# Patient Record
Sex: Female | Born: 1935
Health system: Southern US, Community
[De-identification: ages and names within clinical notes are randomized; demographics above are authoritative.]

## PROBLEM LIST (undated history)

## (undated) DIAGNOSIS — M1990 Unspecified osteoarthritis, unspecified site: Secondary | ICD-10-CM

## (undated) DIAGNOSIS — E785 Hyperlipidemia, unspecified: Secondary | ICD-10-CM

## (undated) DIAGNOSIS — J189 Pneumonia, unspecified organism: Secondary | ICD-10-CM

## (undated) DIAGNOSIS — I10 Essential (primary) hypertension: Secondary | ICD-10-CM

## (undated) DIAGNOSIS — I4891 Unspecified atrial fibrillation: Secondary | ICD-10-CM

## (undated) HISTORY — DX: Hyperlipidemia, unspecified: E78.5

## (undated) HISTORY — PX: CATARACT EXTRACTION: SUR2

## (undated) HISTORY — DX: Unspecified atrial fibrillation: I48.91

## (undated) HISTORY — DX: Unspecified osteoarthritis, unspecified site: M19.90

## (undated) HISTORY — PX: BACK SURGERY: SHX140

## (undated) HISTORY — PX: LAMINECTOMY: SHX219

## (undated) HISTORY — DX: Pneumonia, unspecified organism: J18.9

## (undated) HISTORY — DX: Essential (primary) hypertension: I10

---
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
## 1974-06-29 HISTORY — PX: ABDOMINAL HYSTERECTOMY: SHX81

## 1997-06-29 HISTORY — PX: HAND TENDON SURGERY: SHX663

## 1997-06-29 HISTORY — PX: LAMINECTOMY: SHX219

## 2001-06-29 HISTORY — PX: CHOLECYSTECTOMY: SHX55

## 2010-09-22 ENCOUNTER — Other Ambulatory Visit: Payer: Self-pay | Admitting: Internal Medicine

## 2010-09-22 ENCOUNTER — Encounter: Payer: Self-pay | Admitting: Internal Medicine

## 2010-09-22 DIAGNOSIS — Z1231 Encounter for screening mammogram for malignant neoplasm of breast: Secondary | ICD-10-CM

## 2010-10-21 ENCOUNTER — Encounter: Payer: Self-pay | Admitting: Cardiology

## 2010-10-21 DIAGNOSIS — M1990 Unspecified osteoarthritis, unspecified site: Secondary | ICD-10-CM | POA: Insufficient documentation

## 2010-10-21 DIAGNOSIS — I10 Essential (primary) hypertension: Secondary | ICD-10-CM | POA: Insufficient documentation

## 2010-10-21 DIAGNOSIS — J45909 Unspecified asthma, uncomplicated: Secondary | ICD-10-CM | POA: Insufficient documentation

## 2010-10-21 DIAGNOSIS — E119 Type 2 diabetes mellitus without complications: Secondary | ICD-10-CM | POA: Insufficient documentation

## 2010-10-21 DIAGNOSIS — E785 Hyperlipidemia, unspecified: Secondary | ICD-10-CM | POA: Insufficient documentation

## 2010-10-28 ENCOUNTER — Ambulatory Visit (INDEPENDENT_AMBULATORY_CARE_PROVIDER_SITE_OTHER): Payer: Medicare Other | Admitting: Cardiology

## 2010-10-28 ENCOUNTER — Encounter: Payer: Self-pay | Admitting: Cardiology

## 2010-10-28 VITALS — BP 140/70 | HR 68 | Ht 66.0 in | Wt 233.2 lb

## 2010-10-28 DIAGNOSIS — I4891 Unspecified atrial fibrillation: Secondary | ICD-10-CM

## 2010-10-28 DIAGNOSIS — E785 Hyperlipidemia, unspecified: Secondary | ICD-10-CM

## 2010-10-28 DIAGNOSIS — I10 Essential (primary) hypertension: Secondary | ICD-10-CM

## 2010-10-28 NOTE — Patient Instructions (Signed)
Continue your current medications.  We will request your prior records from [facility].  We will plan on seeing you back in 1 year unless you have new complaints.

## 2010-10-28 NOTE — Assessment & Plan Note (Signed)
Continue therapy with Crestor.

## 2010-10-28 NOTE — Assessment & Plan Note (Signed)
Blood pressure is well controlled and she is on appropriate medical therapy.

## 2010-10-28 NOTE — Assessment & Plan Note (Addendum)
Status post successful DC cardioversion in July of 2011. The difficulty is that she was completely asymptomatic at that time and so it is hard to tell if she has had any episodes of atrial fibrillation since that time. She does have a [place] score of at least 3. This would argue for long-term anticoagulation. I also agree with her current management with beta blocker and ACE inhibitor therapy. I would avoid caffeine. We will request a copy of her cardiology evaluation from [facility].

## 2010-10-28 NOTE — Progress Notes (Signed)
Casey Patel Date of Birth: 1930-04-15   History of Present Illness: Casey Patel is a very pleasant 77 year old white female who is seen at the request of Dr. Wylene Simmer to establish cardiac care. She recently moved from [place] to be close to her sister here. She has a history of atrial fibrillation and underwent DC cardioversion in July of 2011. At that time atrial fibrillation was noted on a routine physical. She reports that she was completely asymptomatic. She had no chest pain, shortness of breath, dizziness, palpitations, or diaphoresis. As best she can tell she has not had any recurrence. She has been on chronic Coumadin therapy. She has no prior history of stroke. She denies any other cardiac history. In general she feels well but is limited by arthritis.  Current Outpatient Prescriptions on File Prior to Visit  Medication Sig Dispense Refill  . albuterol (PROAIR HFA) 108 (90 BASE) MCG/ACT inhaler Inhale 2 puffs into the lungs every 4 (four) hours as needed.        . cyclobenzaprine (FLEXERIL) 10 MG tablet Take 10 mg by mouth 3 (three) times daily as needed.        Tery Sanfilippo Calcium (STOOL SOFTENER PO) Take 1 tablet by mouth as needed.        [name] FERROUS SULFATE PO Take 40 mg by mouth daily.        [name] glipiZIDE-metformin (METAGLIP) 2.5-500 MG per tablet Take 1 tablet by mouth daily.        . hydrochlorothiazide 25 MG tablet Take 25 mg by mouth daily.        . metoprolol (TOPROL-XL) 100 MG 24 hr tablet Take 100 mg by mouth daily.        . ramipril (ALTACE) 10 MG tablet Take 10 mg by mouth daily.        . rosuvastatin (CRESTOR) 5 MG tablet Take 5 mg by mouth daily.        . traMADol (ULTRAM) 50 MG tablet Take 50 mg by mouth every 6 (six) hours as needed.        . warfarin (COUMADIN) 5 MG tablet Take 5 mg by mouth daily. AS DIRECTED         Allergies  Allergen Reactions  . Other     Adhesive tap  . Phenergan     Disorientation   . Tylenol (Acetaminophen)     Causes fast  heart rate  . Codeine Itching and Rash    Past Medical History  Diagnosis Date  . Arthritis   . Hypertension   . Hyperlipidemia   . AF (atrial fibrillation)   . Asthma   . Diabetes mellitus     TYPE 2    Past Surgical History  Procedure Date  . Tonsillectomy 1952  . Abdominal hysterectomy 1976  . Cholecystectomy 2003  . Laminectomy 1999    with fusion (L1-5)  . Hand tendon surgery 1999    left wrist - thumb  . Laminectomy [id number]    with fusion (L3-4)    History  Smoking status  . Former Smoker -- 0.8 packs/day for 20 years  . Types: Cigarettes  . Quit date: 06/29/1978  Smokeless tobacco  . Never Used    History  Alcohol Use No    Family History  Problem Relation Age of Onset  . Hypertension Mother 72  . Stroke Mother 40  . Arthritis Mother 40  . Heart attack Father 70  . Heart disease Father 34  . Hypertension  Father 2  . Ulcers Father 74  . Heart disease Brother     CONGENTIAL HEART DISEASE  . Heart disease Sister   . Diabetes Sister     Review of Systems: The review of systems is positive for significant arthritis in her hands, back, and knees. She previously took Naprosyn for this but said she has been on Coumadin she no longer takes NSAIDs. She reports her Coumadin checks have all been therapeutic.  All other systems were reviewed and are negative.  Physical Exam: BP 140/70  Pulse 68  Ht 5\' 6"  (1.676 m)  Wt 233 lb 4 oz (105.802 kg)  BMI 37.65 kg/m2 She is a pleasant elderly white female in no acute distress. She is normocephalic, atraumatic. Pupils are equal round and reactive to light and accommodation. Extraocular movements are full. Oropharynx is clear. Neck is supple without JVD, adenopathy, thyromegaly, or bruits. Lungs are clear. Cardiac exam reveals a regular rate and rhythm without gallop, murmur, or click. Abdomen is obese, soft, nontender. She has no masses are passed the megaly. She has no edema. Pulses are good. Skin is warm and dry.  She is kyphotic. And walks with a walker. Cranial nerves II through XII are intact. LABORATORY DATA: ECG demonstrates normal sinus rhythm with an incomplete right bundle branch block.  Assessment / Plan:

## 2010-10-29 ENCOUNTER — Encounter: Payer: Self-pay | Admitting: Cardiology

## 2011-02-11 ENCOUNTER — Ambulatory Visit
Admission: RE | Admit: 2011-02-11 | Discharge: 2011-02-11 | Disposition: A | Discharge status: Home / Self Care | Payer: Medicare Other | Source: Ambulatory Visit | Attending: Internal Medicine | Admitting: Internal Medicine

## 2011-02-11 DIAGNOSIS — Z1231 Encounter for screening mammogram for malignant neoplasm of breast: Secondary | ICD-10-CM

## 2011-07-15 DIAGNOSIS — Z7901 Long term (current) use of anticoagulants: Secondary | ICD-10-CM | POA: Diagnosis not present

## 2011-07-15 DIAGNOSIS — I4891 Unspecified atrial fibrillation: Secondary | ICD-10-CM | POA: Diagnosis not present

## 2011-07-16 ENCOUNTER — Emergency Department (HOSPITAL_COMMUNITY): Payer: Medicare Other

## 2011-07-16 ENCOUNTER — Emergency Department (HOSPITAL_COMMUNITY)
Admission: EM | Admit: 2011-07-16 | Discharge: 2011-07-16 | Disposition: A | Discharge status: Home / Self Care | Payer: Medicare Other | Attending: Emergency Medicine | Admitting: Emergency Medicine

## 2011-07-16 DIAGNOSIS — S0191XA Laceration without foreign body of unspecified part of head, initial encounter: Secondary | ICD-10-CM

## 2011-07-16 DIAGNOSIS — S0100XA Unspecified open wound of scalp, initial encounter: Secondary | ICD-10-CM | POA: Diagnosis not present

## 2011-07-16 DIAGNOSIS — I10 Essential (primary) hypertension: Secondary | ICD-10-CM | POA: Diagnosis not present

## 2011-07-16 DIAGNOSIS — Z7901 Long term (current) use of anticoagulants: Secondary | ICD-10-CM | POA: Insufficient documentation

## 2011-07-16 DIAGNOSIS — W19XXXA Unspecified fall, initial encounter: Secondary | ICD-10-CM | POA: Insufficient documentation

## 2011-07-16 DIAGNOSIS — E119 Type 2 diabetes mellitus without complications: Secondary | ICD-10-CM | POA: Insufficient documentation

## 2011-07-16 DIAGNOSIS — J45909 Unspecified asthma, uncomplicated: Secondary | ICD-10-CM | POA: Insufficient documentation

## 2011-07-16 DIAGNOSIS — S0990XA Unspecified injury of head, initial encounter: Secondary | ICD-10-CM | POA: Insufficient documentation

## 2011-07-16 DIAGNOSIS — Z79899 Other long term (current) drug therapy: Secondary | ICD-10-CM | POA: Diagnosis not present

## 2011-07-16 DIAGNOSIS — S0190XA Unspecified open wound of unspecified part of head, initial encounter: Secondary | ICD-10-CM | POA: Diagnosis not present

## 2011-07-16 DIAGNOSIS — E785 Hyperlipidemia, unspecified: Secondary | ICD-10-CM | POA: Diagnosis not present

## 2011-07-16 DIAGNOSIS — M129 Arthropathy, unspecified: Secondary | ICD-10-CM | POA: Insufficient documentation

## 2011-07-16 DIAGNOSIS — Y92009 Unspecified place in unspecified non-institutional (private) residence as the place of occurrence of the external cause: Secondary | ICD-10-CM | POA: Insufficient documentation

## 2011-07-16 DIAGNOSIS — T1490XA Injury, unspecified, initial encounter: Secondary | ICD-10-CM | POA: Diagnosis not present

## 2011-07-16 DIAGNOSIS — I4891 Unspecified atrial fibrillation: Secondary | ICD-10-CM | POA: Diagnosis not present

## 2011-07-16 LAB — BASIC METABOLIC PANEL
BUN: 17 mg/dL (ref 6–23)
Creatinine, Ser: 0.74 mg/dL (ref 0.50–1.10)
GFR calc Af Amer: >90 mL/min (ref >90)
GFR calc non Af Amer: 79 mL/min — ABNORMAL LOW (ref >90)
Glucose, Bld: 101 mg/dL — ABNORMAL HIGH (ref 70–99)

## 2011-07-16 LAB — CBC
HCT: 37.7 % (ref 36.0–46.0)
Hemoglobin: 12.2 g/dL (ref 12.0–15.0)
MCH: 29.3 pg (ref 26.0–34.0)
MCHC: 32.4 g/dL (ref 30.0–36.0)
MCV: 90.4 fL (ref 78.0–100.0)
RDW: 14.3 % (ref 11.5–15.5)

## 2011-07-16 MED ORDER — TRAMADOL HCL 50 MG PO TABS: 50.0000 mg | ORAL_TABLET | Freq: Four times a day (QID) | ORAL | Status: AC | PRN

## 2011-07-16 MED ORDER — LIDOCAINE-EPINEPHRINE (PF) 1 %-1:200000 IJ SOLN
INTRAMUSCULAR | Status: AC
  Filled 2011-07-16: qty 10

## 2011-07-16 MED ORDER — BACITRACIN ZINC 500 UNIT/GM EX OINT
TOPICAL_OINTMENT | CUTANEOUS | Status: AC
  Filled 2011-07-16: qty 2.7

## 2011-07-16 MED ORDER — CEPHALEXIN 500 MG PO CAPS: 500.0000 mg | ORAL_CAPSULE | Freq: Four times a day (QID) | ORAL | Status: AC

## 2011-07-16 MED ORDER — TETANUS-DIPHTH-ACELL PERTUSSIS 5-2.5-18.5 LF-MCG/0.5 IM SUSP
0.5000 mL | Freq: Once | INTRAMUSCULAR | Status: AC
  Administered 2011-07-16: 0.5 mL via INTRAMUSCULAR
  Filled 2011-07-16: qty 0.5

## 2011-07-16 NOTE — ED Provider Notes (Signed)
History     CSN: [id number]  Arrival date & time 07/16/11  0145   First MD Initiated Contact with Patient 07/16/11 0226      Chief Complaint  Patient presents with  . Head Laceration    (Consider location/radiation/quality/duration/timing/severity/associated sxs/prior treatment) Patient is a 78 y.o. female presenting with scalp laceration. The history is provided by the patient.  Head Laceration This is a new problem. The current episode started 1 to 2 hours ago. The problem occurs constantly. The problem has not changed since onset.Pertinent negatives include no chest pain, no abdominal pain and no shortness of breath. The symptoms are aggravated by nothing. The symptoms are relieved by nothing. She has tried nothing for the symptoms.   patient takes Coumadin for atrial fibrillation. She was at home tonight she was with her sister and after using the bathroom stood up and lost her balance and struck her head against the wall. She sustained a laceration to her scalp. She denies any LOC. She denies any neck pain. She denies any weakness or numbness. She denies any preceding symptoms of dizziness, chest pain, shortness of breath or abdominal pain. Bleeding controlled prior to arrival. Patient denies any significant pain or complaints otherwise. She did not injure her upper extremities or lower extremities. No difficulty walking.  Past Medical History  Diagnosis Date  . Arthritis   . Hypertension   . Hyperlipidemia   . AF (atrial fibrillation)   . Asthma   . Diabetes mellitus     TYPE 2    Past Surgical History  Procedure Date  . Tonsillectomy 1952  . Abdominal hysterectomy 1976  . Cholecystectomy 2003  . Laminectomy 1999    with fusion (L1-5)  . Hand tendon surgery 1999    left wrist - thumb  . Laminectomy [id number]    with fusion (L3-4)    Family History  Problem Relation Age of Onset  . Hypertension Mother 9  . Stroke Mother 68  . Arthritis Mother 39  . Heart attack  Father 83  . Heart disease Father 37  . Hypertension Father 24  . Ulcers Father 58  . Heart disease Brother     CONGENTIAL HEART DISEASE  . Heart disease Sister   . Diabetes Sister     History  Substance Use Topics  . Smoking status: Former Smoker -- 0.8 packs/day for 20 years    Types: Cigarettes    Quit date: 06/29/1978  . Smokeless tobacco: Never Used  . Alcohol Use: No    OB History    Grav Para Term Preterm Abortions TAB SAB Ect Mult Living                  Review of Systems  Constitutional: Negative for fever and chills.  HENT: Negative for neck pain and neck stiffness.   Eyes: Negative for pain.  Respiratory: Negative for shortness of breath.   Cardiovascular: Negative for chest pain.  Gastrointestinal: Negative for abdominal pain.  Genitourinary: Negative for dysuria.  Musculoskeletal: Negative for back pain.  Skin: Positive for wound. Negative for rash.  Neurological: Negative for dizziness and light-headedness.  All other systems reviewed and are negative.    Allergies  Other; Phenergan; Tylenol; and Codeine  Home Medications   Current Outpatient Rx  Name Route Sig Dispense Refill  . ALBUTEROL SULFATE HFA 108 (90 BASE) MCG/ACT IN AERS Inhalation Inhale 2 puffs into the lungs every 4 (four) hours as needed.      [name] GLIPIZIDE-METFORMIN  HCL 2.5-500 MG PO TABS Oral Take 1 tablet by mouth daily.      [name] HYDROCHLOROTHIAZIDE 25 MG PO TABS Oral Take 25 mg by mouth daily.      [name] METHOCARBAMOL 750 MG PO TABS Oral Take 750 mg by mouth 4 (four) times daily.    [name] METOPROLOL SUCCINATE ER 100 MG PO TB24 Oral Take 100 mg by mouth daily.      [name] OMEPRAZOLE 20 MG PO CPDR Oral Take 20 mg by mouth daily.    [name] RAMIPRIL 10 MG PO TABS Oral Take 10 mg by mouth daily.      [name] ROSUVASTATIN CALCIUM 5 MG PO TABS Oral Take 5 mg by mouth daily.      . WARFARIN SODIUM 5 MG PO TABS Oral Take 5 mg by mouth daily. Patient takes 2.5mg  on Tuesday and 5mg  all other days      BP 123/52   Pulse 98  Temp(Src) 98.7 F (37.1 C) (Oral)  Resp 18  SpO2 93%  Physical Exam  Constitutional: She is oriented to person, place, and time. She appears well-developed and well-nourished.  HENT:  Head: Normocephalic.       Approximately 15 cm frontal scalp laceration curvilinear and full thickness. No active bleeding. Laceration is completely within the hairline.  Eyes: Conjunctivae and EOM are normal. Pupils are equal, round, and reactive to light.  Neck: Full passive range of motion without pain. Neck supple. No thyromegaly present.       No midline tenderness or deformity  Cardiovascular: Normal rate, regular rhythm, S1 normal, S2 normal and intact distal pulses.   Pulmonary/Chest: Effort normal and breath sounds normal.  Abdominal: Soft. Bowel sounds are normal. There is no tenderness. There is no CVA tenderness.  Musculoskeletal: Normal range of motion.  Neurological: She is alert and oriented to person, place, and time. She has normal strength and normal reflexes. No cranial nerve deficit or sensory deficit. She displays a negative Romberg sign. GCS eye subscore is 4. GCS verbal subscore is 5. GCS motor subscore is 6.       Normal Gait  Skin: Skin is warm and dry. No rash noted. No cyanosis. Nails show no clubbing.  Psychiatric: She has a normal mood and affect. Her speech is normal and behavior is normal.    ED Course  LACERATION REPAIR Date/Time: 07/16/2011 5:00 AM Performed by: Sunnie Nielsen Authorized by: Sunnie Nielsen Consent: Verbal consent obtained. Risks and benefits: risks, benefits and alternatives were discussed Consent given by: patient Patient understanding: patient states understanding of the procedure being performed Patient consent: the patient's understanding of the procedure matches consent given Procedure consent: procedure consent matches procedure scheduled Required items: required blood products, implants, devices, and special equipment available Patient  identity confirmed: verbally with patient Time out: Immediately prior to procedure a "time out" was called to verify the correct patient, procedure, equipment, support staff and site/side marked as required. Body area: head/neck Location details: scalp Laceration length: 15 cm Tendon involvement: none Nerve involvement: none Vascular damage: no Anesthesia: local infiltration Local anesthetic: lidocaine 1% with epinephrine Anesthetic total: 4 ml Preparation: Patient was prepped and draped in the usual sterile fashion. Irrigation solution: saline Irrigation method: syringe Amount of cleaning: extensive Debridement: none Degree of undermining: none Skin closure: staples Number of sutures: 13 Approximation: close Approximation difficulty: complex Patient tolerance: Patient tolerated the procedure well with no immediate complications.   (including critical care time)  Labs Reviewed  PROTIME-INR - Abnormal; Notable  for the following:    Prothrombin Time 19.6 (*)    INR 1.63 (*)    All other components within normal limits  BASIC METABOLIC PANEL - Abnormal; Notable for the following:    Glucose, Bld 101 (*)    GFR calc non Af Amer 79 (*)    All other components within normal limits  CBC   Ct Head Wo Contrast  07/16/2011  *RADIOLOGY REPORT*  Clinical Data:  Hit top of head on wall, with laceration at the top of the head.  Concern for cervical spine injury.  CT HEAD WITHOUT CONTRAST AND CT CERVICAL SPINE WITHOUT CONTRAST  Technique:  Multidetector CT imaging of the head and cervical spine was performed following the standard protocol without intravenous contrast.  Multiplanar CT image reconstructions of the cervical spine were also generated.  Comparison: None  CT HEAD  Findings: There is no evidence of acute infarction, mass lesion, or intra- or extra-axial hemorrhage on CT.  The posterior fossa, including the cerebellum, brainstem and fourth ventricle, is within normal limits.  The  third and lateral ventricles, and basal ganglia are unremarkable in appearance.  The cerebral hemispheres are symmetric in appearance, with normal gray- white differentiation.  No mass effect or midline shift is seen.  There is no evidence of fracture; visualized osseous structures are unremarkable in appearance.  The visualized portions of the orbits are within normal limits.  The paranasal sinuses and mastoid air cells are well-aerated.  A large soft tissue laceration is noted at the vertex, with prominent soft tissue air.  IMPRESSION:  1.  No evidence of traumatic intracranial injury or fracture. 2.  Large soft tissue laceration at the vertex, with associated soft tissue air.  CT CERVICAL SPINE  Findings: There is no evidence of acute fracture or subluxation. There is grade 1 anterolisthesis of C4 on C5, and mild grade 1 anterolisthesis of C3 on C4.  Vertebral bodies demonstrate normal height.  Mild disc space narrowing is noted at multiple levels along the lower cervical spine, with associated anterior and posterior disc osteophyte complexes.  Prevertebral soft tissues are within normal limits.  The thyroid gland is unremarkable in appearance.  The visualized lung apices are clear.  No significant soft tissue abnormalities are seen.  IMPRESSION:  1.  No evidence of acute fracture or subluxation along the cervical spine.  2.  Degenerative change noted along the cervical spine.  Original Report Authenticated By: Tonia Ghent, M.D.   Ct Cervical Spine Wo Contrast  07/16/2011  *RADIOLOGY REPORT*  Clinical Data:  Hit top of head on wall, with laceration at the top of the head.  Concern for cervical spine injury.  CT HEAD WITHOUT CONTRAST AND CT CERVICAL SPINE WITHOUT CONTRAST  Technique:  Multidetector CT imaging of the head and cervical spine was performed following the standard protocol without intravenous contrast.  Multiplanar CT image reconstructions of the cervical spine were also generated.  Comparison:  None  CT HEAD  Findings: There is no evidence of acute infarction, mass lesion, or intra- or extra-axial hemorrhage on CT.  The posterior fossa, including the cerebellum, brainstem and fourth ventricle, is within normal limits.  The third and lateral ventricles, and basal ganglia are unremarkable in appearance.  The cerebral hemispheres are symmetric in appearance, with normal gray- white differentiation.  No mass effect or midline shift is seen.  There is no evidence of fracture; visualized osseous structures are unremarkable in appearance.  The visualized portions of the orbits are within  normal limits.  The paranasal sinuses and mastoid air cells are well-aerated.  A large soft tissue laceration is noted at the vertex, with prominent soft tissue air.  IMPRESSION:  1.  No evidence of traumatic intracranial injury or fracture. 2.  Large soft tissue laceration at the vertex, with associated soft tissue air.  CT CERVICAL SPINE  Findings: There is no evidence of acute fracture or subluxation. There is grade 1 anterolisthesis of C4 on C5, and mild grade 1 anterolisthesis of C3 on C4.  Vertebral bodies demonstrate normal height.  Mild disc space narrowing is noted at multiple levels along the lower cervical spine, with associated anterior and posterior disc osteophyte complexes.  Prevertebral soft tissues are within normal limits.  The thyroid gland is unremarkable in appearance.  The visualized lung apices are clear.  No significant soft tissue abnormalities are seen.  IMPRESSION:  1.  No evidence of acute fracture or subluxation along the cervical spine.  2.  Degenerative change noted along the cervical spine.  Original Report Authenticated By: Tonia Ghent, M.D.     Diagnosis: Head laceration  Patient declines any pain medications   MDM  Head laceration on Coumadin. CAT scan obtained and reviewed as above. No neuro deficits on exam. Wound repaired as above. Bacitracin dressing applied. Patient states her  tetanus is up-to-date 2 years ago. Family bedside will stay with patient and agreeable strict return precautions and staple removal in 10 days. Infection precautions verbalized as understood        Sunnie Nielsen, MD 07/16/11 [phone]

## 2011-07-16 NOTE — ED Notes (Signed)
[id number]<AV> Expected date:<BR> Expected time:<BR> Means of arrival:<BR> Comments:<BR> EMS/huffing paint/altered LOC

## 2011-07-16 NOTE — ED Notes (Signed)
Pt sts was on toilet. When getting up, leaned too far forward and fell forward, striking head on wall. EMS sts approx 2 inch laceration to front of head. Bleeding controlled. Bandage in place. Pt denies LOC, dizziness, N&V.

## 2011-07-16 NOTE — Discharge Instructions (Signed)
Laceration Care, Adult     A laceration is a cut or lesion that goes through all layers of the skin and into the tissue just beneath the skin.  TREATMENT   Some lacerations may not require closure. Some lacerations may not be able to be closed due to an increased risk of infection. It is important to see your caregiver as soon as possible after an injury to minimize the risk of infection and maximize the opportunity for successful closure.  If closure is appropriate, pain medicines may be given, if needed. The wound will be cleaned to help prevent infection. Your caregiver will use stitches (sutures), staples, wound glue (adhesive), or skin adhesive strips to repair the laceration. These tools bring the skin edges together to allow for faster healing and a better cosmetic outcome. However, all wounds will heal with a scar. Once the wound has healed, scarring can be minimized by covering the wound with sunscreen during the day for 1 full year.  HOME CARE INSTRUCTIONS   For sutures or staples:  · Keep the wound clean and dry.   · If you were given a bandage (dressing), you should change it at least once a day. Also, change the dressing if it becomes wet or dirty, or as directed by your caregiver.   · Wash the wound with soap and water 2 times a day. Rinse the wound off with water to remove all soap. Pat the wound dry with a clean towel.   · After cleaning, apply a thin layer of the antibiotic ointment as recommended by your caregiver. This will help prevent infection and keep the dressing from sticking.   · You may shower as usual after the first 24 hours. Do not soak the wound in water until the sutures are removed.   · Only take over-the-counter or prescription medicines for pain, discomfort, or fever as directed by your caregiver.   · Get your sutures or staples removed as directed by your caregiver.   For skin adhesive strips:  · Keep the wound clean and dry.   · Do not get the skin adhesive strips wet. You may  bathe carefully, using caution to keep the wound dry.   · If the wound gets wet, pat it dry with a clean towel.   · Skin adhesive strips will fall off on their own. You may trim the strips as the wound heals. Do not remove skin adhesive strips that are still stuck to the wound. They will fall off in time.   For wound adhesive:  · You may briefly wet your wound in the shower or bath. Do not soak or scrub the wound. Do not swim. Avoid periods of heavy perspiration until the skin adhesive has fallen off on its own. After showering or bathing, gently pat the wound dry with a clean towel.   · Do not apply liquid medicine, cream medicine, or ointment medicine to your wound while the skin adhesive is in place. This may loosen the film before your wound is healed.   · If a dressing is placed over the wound, be careful not to apply tape directly over the skin adhesive. This may cause the adhesive to be pulled off before the wound is healed.   · Avoid prolonged exposure to sunlight or tanning lamps while the skin adhesive is in place. Exposure to ultraviolet light in the first year will darken the scar.   · The skin adhesive will usually remain in place for   5 to 10 days, then naturally fall off the skin. Do not pick at the adhesive film.   You may need a tetanus shot if:  · You cannot remember when you had your last tetanus shot.   · You have never had a tetanus shot.   If you get a tetanus shot, your arm may swell, get red, and feel warm to the touch. This is common and not a problem. If you need a tetanus shot and you choose not to have one, there is a rare chance of getting tetanus. Sickness from tetanus can be serious.  SEEK MEDICAL CARE IF:   · You have redness, swelling, or increasing pain in the wound.   · You see a red line that goes away from the wound.   · You have yellowish-white fluid (pus) coming from the wound.   · You have a fever.   · You notice a bad smell coming from the wound or dressing.   · Your wound  breaks open before or after sutures have been removed.   · You notice something coming out of the wound such as wood or glass.   · Your wound is on your hand or foot and you cannot move a finger or toe.   SEEK IMMEDIATE MEDICAL CARE IF:   · Your pain is not controlled with prescribed medicine.   · You have severe swelling around the wound causing pain and numbness or a change in color in your arm, hand, leg, or foot.   · Your wound splits open and starts bleeding.   · You have worsening numbness, weakness, or loss of function of any joint around or beyond the wound.   · You develop painful lumps near the wound or on the skin anywhere on your body.   MAKE SURE YOU:   · Understand these instructions.   · Will watch your condition.   · Will get help right away if you are not doing well or get worse.   Document Released: 06/15/2005 Document Revised: 02/25/2011 Document Reviewed: 12/09/2010  [employer]® Patient Information ©2012 [employer].

## 2011-07-21 DIAGNOSIS — S6720XA Crushing injury of unspecified hand, initial encounter: Secondary | ICD-10-CM | POA: Diagnosis not present

## 2011-07-27 DIAGNOSIS — Z4802 Encounter for removal of sutures: Secondary | ICD-10-CM | POA: Diagnosis not present

## 2011-07-31 DIAGNOSIS — G5600 Carpal tunnel syndrome, unspecified upper limb: Secondary | ICD-10-CM | POA: Diagnosis not present

## 2011-08-12 DIAGNOSIS — I4891 Unspecified atrial fibrillation: Secondary | ICD-10-CM | POA: Diagnosis not present

## 2011-08-12 DIAGNOSIS — Z7901 Long term (current) use of anticoagulants: Secondary | ICD-10-CM | POA: Diagnosis not present

## 2011-08-13 DIAGNOSIS — G5600 Carpal tunnel syndrome, unspecified upper limb: Secondary | ICD-10-CM | POA: Diagnosis not present

## 2011-08-13 DIAGNOSIS — G603 Idiopathic progressive neuropathy: Secondary | ICD-10-CM | POA: Diagnosis not present

## 2011-08-27 DIAGNOSIS — G5600 Carpal tunnel syndrome, unspecified upper limb: Secondary | ICD-10-CM | POA: Diagnosis not present

## 2011-09-02 ENCOUNTER — Telehealth: Payer: Self-pay | Admitting: Cardiology

## 2011-09-02 NOTE — Telephone Encounter (Signed)
Patient called was told Dr.Jordan out of office this week,will check with him next week and call her back.

## 2011-09-02 NOTE — Telephone Encounter (Signed)
Pt calling re having carpel tunnel surgery with dr  Merlyn Lot  needs surgical clearance , pls call

## 2011-09-08 ENCOUNTER — Other Ambulatory Visit: Payer: Self-pay | Admitting: Orthopedic Surgery

## 2011-09-10 ENCOUNTER — Encounter (HOSPITAL_BASED_OUTPATIENT_CLINIC_OR_DEPARTMENT_OTHER): Payer: Self-pay | Admitting: *Deleted

## 2011-09-10 NOTE — Progress Notes (Signed)
Pt saw dr [place] 5/12-moved fr[place]-lives with sister-dr Merlyn Lot asked for cc-dr jordan-pt called office last week-has not heard back-will ck Needs pt-ptt-bmet 09/14/11

## 2011-09-11 NOTE — Telephone Encounter (Signed)
[phone] FAX.  New problem:  Per Loraine , status of cardiac clearance & stop coumadin for 3 day.- Carpel tunnel surgery.

## 2011-09-11 NOTE — Telephone Encounter (Signed)
Spoke to [place] at [employer] told ok with Dr.Jordan for patient to have carpel tunnel surgery.Advised to hold coumadin 5 days before surgery.

## 2011-09-14 ENCOUNTER — Encounter (HOSPITAL_BASED_OUTPATIENT_CLINIC_OR_DEPARTMENT_OTHER)
Admission: RE | Admit: 2011-09-14 | Discharge: 2011-09-14 | Disposition: A | Discharge status: Home / Self Care | Payer: Medicare Other | Source: Ambulatory Visit | Attending: Orthopedic Surgery | Admitting: Orthopedic Surgery

## 2011-09-14 DIAGNOSIS — Z01812 Encounter for preprocedural laboratory examination: Secondary | ICD-10-CM | POA: Diagnosis not present

## 2011-09-14 DIAGNOSIS — E785 Hyperlipidemia, unspecified: Secondary | ICD-10-CM | POA: Diagnosis not present

## 2011-09-14 DIAGNOSIS — I10 Essential (primary) hypertension: Secondary | ICD-10-CM | POA: Diagnosis not present

## 2011-09-14 DIAGNOSIS — K219 Gastro-esophageal reflux disease without esophagitis: Secondary | ICD-10-CM | POA: Diagnosis not present

## 2011-09-14 DIAGNOSIS — G5600 Carpal tunnel syndrome, unspecified upper limb: Secondary | ICD-10-CM | POA: Diagnosis not present

## 2011-09-14 DIAGNOSIS — E119 Type 2 diabetes mellitus without complications: Secondary | ICD-10-CM | POA: Diagnosis not present

## 2011-09-14 DIAGNOSIS — I4891 Unspecified atrial fibrillation: Secondary | ICD-10-CM | POA: Diagnosis not present

## 2011-09-14 DIAGNOSIS — J45909 Unspecified asthma, uncomplicated: Secondary | ICD-10-CM | POA: Diagnosis not present

## 2011-09-14 LAB — BASIC METABOLIC PANEL
BUN: 15 mg/dL (ref 6–23)
Calcium: 9.2 mg/dL (ref 8.4–10.5)
Chloride: 97 mEq/L (ref 96–112)
Creatinine, Ser: 0.62 mg/dL (ref 0.50–1.10)
GFR calc Af Amer: >90 mL/min (ref >90)

## 2011-09-14 LAB — PROTIME-INR: Prothrombin Time: 15.8 seconds — ABNORMAL HIGH (ref 11.6–15.2)

## 2011-09-15 ENCOUNTER — Encounter (HOSPITAL_BASED_OUTPATIENT_CLINIC_OR_DEPARTMENT_OTHER)
Admission: RE | Disposition: A | Discharge status: Home / Self Care | Payer: Self-pay | Source: Ambulatory Visit | Attending: Orthopedic Surgery

## 2011-09-15 ENCOUNTER — Encounter (HOSPITAL_BASED_OUTPATIENT_CLINIC_OR_DEPARTMENT_OTHER): Payer: Self-pay

## 2011-09-15 ENCOUNTER — Encounter (HOSPITAL_BASED_OUTPATIENT_CLINIC_OR_DEPARTMENT_OTHER): Payer: Self-pay | Admitting: Anesthesiology

## 2011-09-15 ENCOUNTER — Ambulatory Visit (HOSPITAL_BASED_OUTPATIENT_CLINIC_OR_DEPARTMENT_OTHER): Payer: Medicare Other | Admitting: Anesthesiology

## 2011-09-15 ENCOUNTER — Ambulatory Visit (HOSPITAL_BASED_OUTPATIENT_CLINIC_OR_DEPARTMENT_OTHER)
Admission: RE | Admit: 2011-09-15 | Discharge: 2011-09-15 | Disposition: A | Discharge status: Home / Self Care | Payer: Medicare Other | Source: Ambulatory Visit | Attending: Orthopedic Surgery | Admitting: Orthopedic Surgery

## 2011-09-15 ENCOUNTER — Encounter (HOSPITAL_BASED_OUTPATIENT_CLINIC_OR_DEPARTMENT_OTHER): Payer: Self-pay | Admitting: Orthopedic Surgery

## 2011-09-15 ENCOUNTER — Encounter (HOSPITAL_BASED_OUTPATIENT_CLINIC_OR_DEPARTMENT_OTHER): Payer: Self-pay | Admitting: Certified Registered"

## 2011-09-15 DIAGNOSIS — I10 Essential (primary) hypertension: Secondary | ICD-10-CM | POA: Insufficient documentation

## 2011-09-15 DIAGNOSIS — E119 Type 2 diabetes mellitus without complications: Secondary | ICD-10-CM | POA: Insufficient documentation

## 2011-09-15 DIAGNOSIS — K219 Gastro-esophageal reflux disease without esophagitis: Secondary | ICD-10-CM | POA: Insufficient documentation

## 2011-09-15 DIAGNOSIS — G5600 Carpal tunnel syndrome, unspecified upper limb: Secondary | ICD-10-CM | POA: Diagnosis not present

## 2011-09-15 DIAGNOSIS — I4891 Unspecified atrial fibrillation: Secondary | ICD-10-CM | POA: Insufficient documentation

## 2011-09-15 DIAGNOSIS — E785 Hyperlipidemia, unspecified: Secondary | ICD-10-CM | POA: Insufficient documentation

## 2011-09-15 DIAGNOSIS — J45909 Unspecified asthma, uncomplicated: Secondary | ICD-10-CM | POA: Diagnosis not present

## 2011-09-15 DIAGNOSIS — Z01812 Encounter for preprocedural laboratory examination: Secondary | ICD-10-CM | POA: Insufficient documentation

## 2011-09-15 HISTORY — PX: CARPAL TUNNEL RELEASE: SHX101

## 2011-09-15 LAB — GLUCOSE, CAPILLARY: Glucose-Capillary: 104 mg/dL — ABNORMAL HIGH (ref 70–99)

## 2011-09-15 SURGERY — CARPAL TUNNEL RELEASE
Anesthesia: Regional | Site: Wrist | Laterality: Right | Wound class: Clean

## 2011-09-15 MED ORDER — CHLORHEXIDINE GLUCONATE 4 % EX LIQD: 60.0000 mL | Freq: Once | CUTANEOUS | Status: DC

## 2011-09-15 MED ORDER — LACTATED RINGERS IV SOLN
INTRAVENOUS | Status: DC
  Administered 2011-09-15: 09:00:00 via INTRAVENOUS

## 2011-09-15 MED ORDER — CEFAZOLIN SODIUM-DEXTROSE 2-3 GM-% IV SOLR
2.0000 g | INTRAVENOUS | Status: AC
  Administered 2011-09-15: 2 g via INTRAVENOUS

## 2011-09-15 MED ORDER — PENTAZOCINE-NALOXONE 50-0.5 MG PO TABS: 1.0000 | ORAL_TABLET | ORAL | Status: AC | PRN

## 2011-09-15 MED ORDER — FENTANYL CITRATE 0.05 MG/ML IJ SOLN
INTRAMUSCULAR | Status: DC | PRN
  Administered 2011-09-15: 25 ug via INTRAVENOUS
  Administered 2011-09-15: 50 ug via INTRAVENOUS

## 2011-09-15 MED ORDER — BUPIVACAINE HCL (PF) 0.25 % IJ SOLN
INTRAMUSCULAR | Status: DC | PRN
  Administered 2011-09-15: 7 mL

## 2011-09-15 MED ORDER — FENTANYL CITRATE 0.05 MG/ML IJ SOLN: 25.0000 ug | INTRAMUSCULAR | Status: DC | PRN

## 2011-09-15 MED ORDER — PROPOFOL 10 MG/ML IV EMUL
INTRAVENOUS | Status: DC | PRN
  Administered 2011-09-15: 60 ug/kg/min via INTRAVENOUS
  Administered 2011-09-15: 50 ug/kg/min via INTRAVENOUS

## 2011-09-15 MED ORDER — ONDANSETRON HCL 4 MG/2ML IJ SOLN
INTRAMUSCULAR | Status: DC | PRN
  Administered 2011-09-15: 4 mg via INTRAVENOUS

## 2011-09-15 MED ORDER — CEFAZOLIN SODIUM 1-5 GM-% IV SOLN: 1.0000 g | INTRAVENOUS | Status: DC

## 2011-09-15 SURGICAL SUPPLY — 38 items
BANDAGE GAUZE ELAST BULKY 4 IN (GAUZE/BANDAGES/DRESSINGS) ×2 IMPLANT
BLADE SURG 15 STRL LF DISP TIS (BLADE) ×1 IMPLANT
BLADE SURG 15 STRL SS (BLADE) ×1
BNDG COHESIVE 3X5 TAN STRL LF (GAUZE/BANDAGES/DRESSINGS) ×2 IMPLANT
BNDG ESMARK 4X9 LF (GAUZE/BANDAGES/DRESSINGS) IMPLANT
CHLORAPREP W/TINT 26ML (MISCELLANEOUS) ×2 IMPLANT
CLOTH BEACON ORANGE TIMEOUT ST (SAFETY) ×2 IMPLANT
CORDS BIPOLAR (ELECTRODE) ×2 IMPLANT
COVER MAYO STAND STRL (DRAPES) ×2 IMPLANT
COVER TABLE BACK 60X90 (DRAPES) ×2 IMPLANT
CUFF TOURNIQUET SINGLE 18IN (TOURNIQUET CUFF) ×2 IMPLANT
DRAPE EXTREMITY T 121X128X90 (DRAPE) ×2 IMPLANT
DRAPE SURG 17X23 STRL (DRAPES) ×2 IMPLANT
DRSG KUZMA FLUFF (GAUZE/BANDAGES/DRESSINGS) ×2 IMPLANT
GAUZE XEROFORM 1X8 LF (GAUZE/BANDAGES/DRESSINGS) ×2 IMPLANT
GLOVE BIO SURGEON STRL SZ 6.5 (GLOVE) ×2 IMPLANT
GLOVE BIO SURGEON STRL SZ7.5 (GLOVE) ×2 IMPLANT
GLOVE BIOGEL PI IND STRL 8 (GLOVE) ×1 IMPLANT
GLOVE BIOGEL PI INDICATOR 8 (GLOVE) ×1
GLOVE SURG ORTHO 8.0 STRL STRW (GLOVE) ×2 IMPLANT
GLOVE SURG SS PI 8.0 STRL IVOR (GLOVE) ×2 IMPLANT
GOWN BRE IMP PREV XXLGXLNG (GOWN DISPOSABLE) ×6 IMPLANT
GOWN PREVENTION PLUS XLARGE (GOWN DISPOSABLE) IMPLANT
NEEDLE 27GAX1X1/2 (NEEDLE) ×2 IMPLANT
NS IRRIG 1000ML POUR BTL (IV SOLUTION) ×2 IMPLANT
PACK BASIN DAY SURGERY FS (CUSTOM PROCEDURE TRAY) ×2 IMPLANT
PAD CAST 3X4 CTTN HI CHSV (CAST SUPPLIES) ×1 IMPLANT
PADDING CAST ABS 4INX4YD NS (CAST SUPPLIES) ×1
PADDING CAST ABS COTTON 4X4 ST (CAST SUPPLIES) ×1 IMPLANT
PADDING CAST COTTON 3X4 STRL (CAST SUPPLIES) ×1
SPONGE GAUZE 4X4 12PLY (GAUZE/BANDAGES/DRESSINGS) ×2 IMPLANT
STOCKINETTE 4X48 STRL (DRAPES) ×2 IMPLANT
SUT VICRYL 4-0 PS2 18IN ABS (SUTURE) IMPLANT
SUT VICRYL RAPIDE 4/0 PS 2 (SUTURE) ×2 IMPLANT
SYR BULB 3OZ (MISCELLANEOUS) ×2 IMPLANT
SYR CONTROL 10ML LL (SYRINGE) ×2 IMPLANT
TOWEL OR 17X24 6PK STRL BLUE (TOWEL DISPOSABLE) ×2 IMPLANT
UNDERPAD 30X30 INCONTINENT (UNDERPADS AND DIAPERS) ×2 IMPLANT

## 2011-09-15 NOTE — Transfer of Care (Signed)
Immediate Anesthesia Transfer of Care Note  Patient: Casey Patel  Procedure(s) Performed: Procedure(s) (LRB): CARPAL TUNNEL RELEASE (Right)  Patient Location: PACU  Anesthesia Type: Bier block  Level of Consciousness: awake, alert , oriented and patient cooperative  Airway & Oxygen Therapy: Patient Spontanous Breathing and Patient connected to face mask oxygen  Post-op Assessment: Report given to PACU RN and Post -op Vital signs reviewed and stable  Post vital signs: Reviewed and stable  Complications: No apparent anesthesia complications

## 2011-09-15 NOTE — Anesthesia Postprocedure Evaluation (Signed)
  Anesthesia Post-op Note  Patient: Casey Patel  Procedure(s) Performed: Procedure(s) (LRB): CARPAL TUNNEL RELEASE (Right)  Patient Location: PACU  Anesthesia Type: Bier block  Level of Consciousness: awake, alert  and oriented  Airway and Oxygen Therapy: Patient Spontanous Breathing  Post-op Pain: none  Post-op Assessment: Post-op Vital signs reviewed, Patient's Cardiovascular Status Stable, Respiratory Function Stable, Patent Airway, No signs of Nausea or vomiting and Pain level controlled  Post-op Vital Signs: Reviewed and stable  Complications: No apparent anesthesia complications

## 2011-09-15 NOTE — Anesthesia Preprocedure Evaluation (Signed)
Anesthesia Evaluation  Patient identified by MRN, date of birth, ID band Patient awake    Reviewed: Allergy & Precautions, H&P , NPO status , Patient's Chart, lab work & pertinent test results, reviewed documented beta blocker date and time   History of Anesthesia Complications Negative for: history of anesthetic complications  Airway Mallampati: I TM Distance: >3 FB Neck ROM: Full    Dental  (+) Teeth Intact, Missing, Chipped and Dental Advisory Given   Pulmonary asthma (daily inhalers) ,  breath sounds clear to auscultation  Pulmonary exam normal       Cardiovascular METShypertension, Pt. on medications and Pt. on home beta blockers + dysrhythmias (INR 1.23, rate controlled with atenolol) Atrial Fibrillation Rhythm:Irregular Rate:Normal     Neuro/Psych negative neurological ROS  negative psych ROS   GI/Hepatic Neg liver ROS, GERD-  Medicated and Controlled,  Endo/Other  Diabetes mellitus- (glu 102 today), Type 2, Oral Hypoglycemic AgentsMorbid obesity  Renal/GU      Musculoskeletal   Abdominal (+) + obese,   Peds  Hematology negative hematology ROS (+)   Anesthesia Other Findings   Reproductive/Obstetrics                           Anesthesia Physical Anesthesia Plan  ASA: III  Anesthesia Plan: Bier Block and MAC   Post-op Pain Management:    Induction:   Airway Management Planned: Simple Face Mask  Additional Equipment:   Intra-op Plan:   Post-operative Plan:   Informed Consent: I have reviewed the patients History and Physical, chart, labs and discussed the procedure including the risks, benefits and alternatives for the proposed anesthesia with the patient or authorized representative who has indicated his/her understanding and acceptance.   Dental advisory given  Plan Discussed with: CRNA and Surgeon  Anesthesia Plan Comments: (Plan routine monitors, IV regional  Lidocaine)        Anesthesia Quick Evaluation

## 2011-09-15 NOTE — Discharge Instructions (Signed)
  [facility]  [address] [place], [place] [address] [phone]   Post Anesthesia Home Care Instructions  Activity: Get plenty of rest for the remainder of the day. A responsible adult should stay with you for 24 hours following the procedure.  For the next 24 hours, DO NOT: -Drive a car -Operate machinery -Drink alcoholic beverages -Take any medication unless instructed by your physician -Make any legal decisions or sign important papers.  Meals: Start with liquid foods such as gelatin or soup. Progress to regular foods as tolerated. Avoid greasy, spicy, heavy foods. If nausea and/or vomiting occur, drink only clear liquids until the nausea and/or vomiting subsides. Call your physician if vomiting continues.  Special Instructions/Symptoms: Your throat may feel dry or sore from the anesthesia or the breathing tube placed in your throat during surgery. If this causes discomfort, gargle with warm salt water. The discomfort should disappear within 24 hours.    [facility] Center Instructions Hand Surgery  Wound Care: Keep your hand elevated above the level of your heart.  Do not allow it to dangle  by your side.  Keep the dressing dry and do not remove it unless your doctor advises you to do so.  He will usually change it at the time of your post-op visit.  Moving your fingers is advised to stimulate circulation but will depend on the site of your surgery.  If you have a splint applied, your doctor will advise you regarding movement.  Activity: Do not drive or operate machinery today.  Rest today and then you may return to your normal activity and work as indicated by your physician.  Diet:  Drink liquids today or eat a light diet.  You may resume a regular diet tomorrow.    General expectations: Pain for two to three days. Fingers may become slightly swollen.  Call your doctor if any of the following occur: Severe pain not relieved by pain  medication. Elevated temperature. Dressing soaked with blood. Inability to move fingers. White or bluish color to fingers. 

## 2011-09-15 NOTE — Op Note (Signed)
[name], Casey Patel                ACCOUNT NO.:  [id number]  MEDICAL RECORD NO.:  [id number]  LOCATION:                                 FACILITY:  PHYSICIAN:  Cindee Salt, M.D.       DATE OF BIRTH:  December 05, 1929  DATE OF PROCEDURE:  09/15/2011 DATE OF DISCHARGE:                              OPERATIVE REPORT   PREOPERATIVE DIAGNOSIS:  Carpal tunnel syndrome, right hand.  POSTOPERATIVE DIAGNOSIS:  Carpal tunnel syndrome, right hand.  OPERATION:  Decompression of right median nerve.  SURGEON:  Cindee Salt, MD.  ANESTHESIA:  Forearm based IV regional with local infiltration.  ANESTHESIOLOGIST:  Dr. Jean Rosenthal.  HISTORY:  The patient is an 78 year old female with a history of carpal tunnel syndrome, EMG nerve conductions positive, and not responsive to conservative treatment.  She has elected to undergo surgical decompression.  Pre, peri, postop course have been discussed along with risks and complications.  She is aware that there is no guarantee with the surgery; possibility of infection; recurrence; injury to arteries, nerves, tendons; incomplete relief of symptoms; dystrophy.  In the preoperative area, the patient was seen, the extremity marked by both the patient and surgeon.  Antibiotic given.  PROCEDURE:  The patient was brought to the operating room where a forearm based IV regional anesthetic was carried out without difficulty. She was prepped using ChloraPrep, supine position, right arm free. Three minutes dry time was allowed.  Time-out taken, confirming the patient and the procedure.  A longitudinal incision was made in the palm and carried down through the subcutaneous tissue.  Bleeders were electrocauterized.  Palmar fascia was split.  Superficial palmar arch identified.  Flexor tendon of the ring and little finger identified to the ulnar side of median nerve.  Carpal retinaculum was incised with sharp dissection.  Right angle and Sewall retractor placed between skin and  forearm fascia.  The fascia was released for approximately 1.5 cm proximal to the wrist crease under direct vision.  Canal was explored. Persistent median artery was noted.  This had not thrombosed.  Wound was copiously irrigated with saline.  The skin was closed with interrupted 4- 0 Vicryl Rapide sutures.  Local infiltration of 0.25% Marcaine without epinephrine was given, approximately 7 mL was used.  Sterile compressive dressing was applied with fingers free.  On deflation of the tourniquet, all fingers immediately pinked.  She was taken to the recovery room for observation in satisfactory condition.          ______________________________ Cindee Salt, M.D.     GK/MEDQ  D:  09/15/2011  T:  09/15/2011  Job:  [id number]

## 2011-09-15 NOTE — H&P (Signed)
Casey Patel is a 78 year-old left-hand dominant female referred by Dr. Cletus Gash for consultation with respect to bilateral hand pain primarily in the carpometacarpal joints of her thumb.  She complains of them being equally affected.  She is status post suspensionplasty done by Dr. Odis Luster in [place] several years ago.  She has been wearing a wrist splint. She has been worked up for gout.  She has history of arthritis, diabetes, no history of thyroid problems or gout.  She is presently on Coumadin.  She complains she had been taking Aleve which had resolved her symptoms, but recently had to have this removed because of the Coumadin. She complains of an intermittent, moderate stabbing pain. She was given a prednisone Dosepak which relieved her symptoms.   She does not want to be on prednisone on a regular basis.  She was given Ultram. She is advised there is potential for gastric problems with the Ultram.   She is complaining of syptoms aof carpal tunnel syndrome.  NCV's are positive  ALLERGIES:   Tylenol, codeine, Phenergan and adhesive tape. MEDICATIONS:   Glucovance, Toprol XL, Altace, omeprazole, Crestor, metacarbamol, albuterol, stool softener, vitamin D, ferrous sulfate, and Coumadin. SURGICAL HISTORY:   tonsillectomy, appendectomy, hysterectomy, meningioma removal, laminectomy, laminectomy with fusion ('95 and '99), left wrist/thumb and cholecystectomy. FAMILY MEDICAL HISTORY:  Positive for diabetes, heart disease, high blood pressure and arthritis. SOCIAL HISTORY:  She does not smoke or drink. She is widowed, retired.   REVIEW OF SYSTEMS:   Positive for glasses, high blood pressure, asthma, otherwise negative 14 points. Casey Patel is an 78 y.o. female.   Chief Complaint: CTS RT HPI: sewe above  Past Medical History  Diagnosis Date  . Arthritis   . Hypertension   . Hyperlipidemia   . AF (atrial fibrillation)   . Asthma   . Diabetes mellitus     TYPE 2    Past Surgical History  Procedure  Date  . Tonsillectomy 1952  . Abdominal hysterectomy 1976  . Cholecystectomy 2003  . Laminectomy 1999    with fusion (L1-5)  . Hand tendon surgery 1999    left wrist - thumb  . Laminectomy [id number]    with fusion (L3-4)  . Back surgery     lumb x 3    Family History  Problem Relation Age of Onset  . Hypertension Mother 3  . Stroke Mother 64  . Arthritis Mother 17  . Heart attack Father 5  . Heart disease Father 11  . Hypertension Father 18  . Ulcers Father 51  . Heart disease Brother     CONGENTIAL HEART DISEASE  . Heart disease Sister   . Diabetes Sister    Social History:  reports that she quit smoking about 33 years ago. Her smoking use included Cigarettes. She has a 16 pack-year smoking history. She has never used smokeless tobacco. She reports that she does not drink alcohol or use illicit drugs.  Allergies:  Allergies  Allergen Reactions  . Other     Adhesive tap  . Phenergan     Disorientation   . Tylenol (Acetaminophen)     Causes fast heart rate  . Codeine Itching and Rash    No current facility-administered medications on file as of .   Medications Prior to Admission  Medication Sig Dispense Refill  . albuterol (PROAIR HFA) 108 (90 BASE) MCG/ACT inhaler Inhale 2 puffs into the lungs every 4 (four) hours as needed.        [name]  glipiZIDE-metformin (METAGLIP) 2.5-500 MG per tablet Take 1 tablet by mouth daily.        . hydrochlorothiazide 25 MG tablet Take 25 mg by mouth daily.        . methocarbamol (ROBAXIN) 750 MG tablet Take 750 mg by mouth 4 (four) times daily.      . metoprolol (TOPROL-XL) 100 MG 24 hr tablet Take 100 mg by mouth daily.        [name] omeprazole (PRILOSEC) 20 MG capsule Take 20 mg by mouth daily.      . ramipril (ALTACE) 10 MG tablet Take 10 mg by mouth daily.        . rosuvastatin (CRESTOR) 5 MG tablet Take 5 mg by mouth daily.        [name] warfarin (COUMADIN) 5 MG tablet Take 5 mg by mouth daily. Patient takes 2.5mg  on Tuesday and 5mg  all other  days        Results for orders placed during the hospital encounter of 09/15/11 (from the past 48 hour(s))  BASIC METABOLIC PANEL     Status: Abnormal   Collection Time   09/14/11 11:00 AM      Component Value Range Comment   Sodium 138  135 - 145 (mEq/L)    Potassium 4.2  3.5 - 5.1 (mEq/L)    Chloride 97  96 - 112 (mEq/L)    CO2 35 (*) 19 - 32 (mEq/L)    Glucose, Bld 130 (*) 70 - 99 (mg/dL)    BUN 15  6 - 23 (mg/dL)    Creatinine, Ser 9.14  0.50 - 1.10 (mg/dL)    Calcium 9.2  8.4 - 10.5 (mg/dL)    GFR calc non Af Amer 83 (*) >90 (mL/min)    GFR calc Af Amer >90  >90 (mL/min)   PROTIME-INR     Status: Abnormal   Collection Time   09/14/11 11:00 AM      Component Value Range Comment   Prothrombin Time 15.8 (*) 11.6 - 15.2 (seconds)    INR 1.23  0.00 - 1.49    APTT     Status: Abnormal   Collection Time   09/14/11 11:00 AM      Component Value Range Comment   aPTT 46 (*) 24 - 37 (seconds)     No results found.   Pertinent items are noted in HPI.  There were no vitals taken for this visit.  General appearance: alert, cooperative and appears stated age Head: Normocephalic, without obvious abnormality Neck: no adenopathy Resp: clear to auscultation bilaterally Cardio: regular rate and rhythm, S1, S2 normal, no murmur, click, rub or gallop GI: soft, non-tender; bowel sounds normal; no masses,  no organomegaly Extremities: extremities normal, atraumatic, no cyanosis or edema Pulses: 2+ and symmetric Skin: Skin color, texture, turgor normal. No rashes or lesions Neurologic: Grossly normal Incision/Wound: na  Assessment/Plan   She would like to go ahead and have the carpal tunnel released on the right side.  She does not want to have anything done to the [facility] arthritis at the present time.  The pre, peri and postoperative course were discussed along with the risks and complications.  The patient is aware there is no guarantee with the surgery, possibility of infection,  recurrence, injury to arteries, nerves, tendons, incomplete relief of symptoms and dystrophy.  She is on Coumadin for afib.  We would recommend having her INR checked. If it is between 2 and 2.5 we will go ahead with the surgery.  Donnell Wion R 09/15/2011, 7:39 AM

## 2011-09-15 NOTE — Brief Op Note (Signed)
09/15/2011  9:57 AM  PATIENT:  Eliezer Bottom  78 y.o. female  PRE-OPERATIVE DIAGNOSIS:  righ tCarpal Tunnel Syndrome  POST-OPERATIVE DIAGNOSIS:  Right Carpal Tunnel Syndrome  PROCEDURE:  Procedure(s) (LRB): CARPAL TUNNEL RELEASE (Right)  SURGEON:  Surgeon(s) and Role:    * Nicki Reaper, MD - Primary    * Tami Ribas, MD - Assisting  PHYSICIAN ASSISTANT:   ASSISTANTS: Karlyn Agee MD   ANESTHESIA:   regional and local  EBL:  Total I/O In: 400 [I.V.:400] Out: -   BLOOD ADMINISTERED:none  DRAINS: none   LOCAL MEDICATIONS USED:  MARCAINE     SPECIMEN:  No Specimen  DISPOSITION OF SPECIMEN:  N/A  COUNTS:  YES  TOURNIQUET:   Total Tourniquet Time Documented: Forearm (Right) - 21 minutes  DICTATION: .Other Dictation: Dictation Number [id number]  PLAN OF CARE: Discharge to home after PACU  PATIENT DISPOSITION:  PACU - hemodynamically stable.

## 2011-09-15 NOTE — Op Note (Signed)
Dictated [id number]

## 2011-09-15 NOTE — Progress Notes (Signed)
Removed O2 and sats decreased to 87% and when she takes deep breath sats increase to mid 90's

## 2011-09-17 ENCOUNTER — Encounter (HOSPITAL_BASED_OUTPATIENT_CLINIC_OR_DEPARTMENT_OTHER): Payer: Self-pay | Admitting: Orthopedic Surgery

## 2011-09-22 ENCOUNTER — Encounter: Payer: Self-pay | Admitting: Cardiology

## 2011-09-22 DIAGNOSIS — E118 Type 2 diabetes mellitus with unspecified complications: Secondary | ICD-10-CM | POA: Diagnosis not present

## 2011-09-22 DIAGNOSIS — E785 Hyperlipidemia, unspecified: Secondary | ICD-10-CM | POA: Diagnosis not present

## 2011-09-22 DIAGNOSIS — I10 Essential (primary) hypertension: Secondary | ICD-10-CM | POA: Diagnosis not present

## 2011-09-22 DIAGNOSIS — Z7901 Long term (current) use of anticoagulants: Secondary | ICD-10-CM | POA: Diagnosis not present

## 2011-10-29 DIAGNOSIS — Z7901 Long term (current) use of anticoagulants: Secondary | ICD-10-CM | POA: Diagnosis not present

## 2011-10-29 DIAGNOSIS — I4891 Unspecified atrial fibrillation: Secondary | ICD-10-CM | POA: Diagnosis not present

## 2011-11-12 ENCOUNTER — Encounter: Payer: Self-pay | Admitting: Cardiology

## 2011-11-12 DIAGNOSIS — R0989 Other specified symptoms and signs involving the circulatory and respiratory systems: Secondary | ICD-10-CM | POA: Diagnosis not present

## 2011-11-12 DIAGNOSIS — R609 Edema, unspecified: Secondary | ICD-10-CM | POA: Diagnosis not present

## 2011-11-24 ENCOUNTER — Encounter: Payer: Self-pay | Admitting: Cardiology

## 2011-11-24 DIAGNOSIS — I10 Essential (primary) hypertension: Secondary | ICD-10-CM | POA: Diagnosis not present

## 2011-11-24 DIAGNOSIS — I509 Heart failure, unspecified: Secondary | ICD-10-CM | POA: Diagnosis not present

## 2011-11-24 DIAGNOSIS — I4891 Unspecified atrial fibrillation: Secondary | ICD-10-CM | POA: Diagnosis not present

## 2011-11-24 DIAGNOSIS — R0602 Shortness of breath: Secondary | ICD-10-CM | POA: Diagnosis not present

## 2011-11-27 ENCOUNTER — Ambulatory Visit: Payer: Medicare Other | Admitting: Cardiology

## 2011-12-03 ENCOUNTER — Encounter: Payer: Self-pay | Admitting: Cardiology

## 2011-12-03 ENCOUNTER — Other Ambulatory Visit: Payer: Self-pay | Admitting: Cardiology

## 2011-12-03 ENCOUNTER — Ambulatory Visit (INDEPENDENT_AMBULATORY_CARE_PROVIDER_SITE_OTHER): Payer: Medicare Other | Admitting: Cardiology

## 2011-12-03 VITALS — BP 120/84 | HR 93 | Ht 66.0 in | Wt 247.8 lb

## 2011-12-03 DIAGNOSIS — J189 Pneumonia, unspecified organism: Secondary | ICD-10-CM | POA: Diagnosis not present

## 2011-12-03 DIAGNOSIS — Z7901 Long term (current) use of anticoagulants: Secondary | ICD-10-CM

## 2011-12-03 DIAGNOSIS — I4891 Unspecified atrial fibrillation: Secondary | ICD-10-CM

## 2011-12-03 DIAGNOSIS — R609 Edema, unspecified: Secondary | ICD-10-CM

## 2011-12-03 LAB — CBC WITH DIFFERENTIAL/PLATELET
Eosinophils Relative: 2.4 % (ref 0.0–5.0)
Lymphocytes Relative: 12.4 % (ref 12.0–46.0)
Monocytes Relative: 6 % (ref 3.0–12.0)
Neutrophils Relative %: 79 % — ABNORMAL HIGH (ref 43.0–77.0)
Platelets: 272 10*3/uL (ref 150.0–400.0)
WBC: 7.6 10*3/uL (ref 4.5–10.5)

## 2011-12-03 LAB — BASIC METABOLIC PANEL
Chloride: 95 mEq/L — ABNORMAL LOW (ref 96–112)
GFR: 84.13 mL/min (ref >60.00)
Potassium: 4 mEq/L (ref 3.5–5.1)

## 2011-12-03 LAB — PROTIME-INR
INR: 2.3 ratio — ABNORMAL HIGH (ref 0.8–1.0)
Prothrombin Time: 25.2 s — ABNORMAL HIGH (ref 10.2–12.4)

## 2011-12-03 NOTE — Progress Notes (Signed)
Casey Patel Date of Birth: [birth year]   History of Present Illness: Casey Patel is seen today for yearly followup. She states she has had a difficult few months. In January 1 she fell in her bathroom and had a significant scalp laceration requiring  suture. She had a CT scan in the emergency department which showed no intracranial bleeding. She later developed some swelling in her hand and had to have carpal tunnel surgery. More recently she states she caught a common cold. She was seen by Dr. Wylene Simmer on May 28 and it was noted that her pulse was fast and irregular. Her Toprol dose was increased. She was also noted to be hypoxic with a saturation of 80%. She had increase in edema and was placed on Lasix. Chest x-ray demonstrated a right middle lobe infiltrate consistent with pneumonia. She was treated with antibiotics. She is feeling better today. Her edema has improved. She is breathing better but still has a cough and shortness of breath. She does have a prior history of a to fibrillation and underwent successful cardioversion in [place] in July of 2011. She has been on chronic Coumadin therapy and has been therapeutic.  Current Outpatient Prescriptions on File Prior to Visit  Medication Sig Dispense Refill  . albuterol (PROAIR HFA) 108 (90 BASE) MCG/ACT inhaler Inhale 2 puffs into the lungs every 4 (four) hours as needed.        . budesonide (PULMICORT) 180 MCG/ACT inhaler Inhale 2 puffs into the lungs daily.      . cetirizine (ZYRTEC) 5 MG tablet Take 5 mg by mouth daily.      . ferrous gluconate (FERGON) 325 MG tablet Take 325 mg by mouth daily with breakfast.      . furosemide (LASIX) 20 MG tablet Take 20 mg by mouth daily.      [name] glipiZIDE-metformin (METAGLIP) 2.5-500 MG per tablet Take 1 tablet by mouth 2 (two) times daily before a meal.       . methocarbamol (ROBAXIN) 750 MG tablet Take 750 mg by mouth 3 (three) times daily.       . metoprolol (TOPROL-XL) 100 MG 24 hr tablet Take 150 mg  by mouth daily. One and one half tablet daily      . omeprazole (PRILOSEC) 20 MG capsule Take 20 mg by mouth daily.      . ramipril (ALTACE) 10 MG tablet Take 10 mg by mouth daily.        . rosuvastatin (CRESTOR) 5 MG tablet Take 5 mg by mouth daily.        [name] warfarin (COUMADIN) 5 MG tablet Take 5 mg by mouth daily. Patient takes 2.5mg  on Tuesday and 5mg  all other days        Allergies  Allergen Reactions  . Other     Adhesive tap  . Promethazine Hcl     Disorientation   . Tylenol (Acetaminophen)     Causes fast heart rate  . Codeine Itching and Rash    Past Medical History  Diagnosis Date  . Arthritis   . Hypertension   . Hyperlipidemia   . AF (atrial fibrillation)   . Asthma   . Diabetes mellitus     TYPE 2  . PNA (pneumonia)     Past Surgical History  Procedure Date  . Tonsillectomy 1952  . Abdominal hysterectomy 1976  . Cholecystectomy 2003  . Laminectomy 1999    with fusion (L1-5)  . Hand tendon surgery 1999  left wrist - thumb  . Laminectomy [id number]    with fusion (L3-4)  . Back surgery     lumb x 3  . Carpal tunnel release 09/15/2011    Procedure: CARPAL TUNNEL RELEASE;  Surgeon: Nicki Reaper, MD;  Location: [facility];  Service: Orthopedics;  Laterality: Right;    History  Smoking status  . Former Smoker -- 0.8 packs/day for 20 years  . Types: Cigarettes  . Quit date: 06/29/1978  Smokeless tobacco  . Never Used    History  Alcohol Use No    Family History  Problem Relation Age of Onset  . Hypertension Mother 71  . Stroke Mother 55  . Arthritis Mother 24  . Heart attack Father 6  . Heart disease Father 52  . Hypertension Father 57  . Ulcers Father 19  . Heart disease Brother     CONGENTIAL HEART DISEASE  . Heart disease Sister   . Diabetes Sister     Review of Systems: The review of systems is positive for significant arthritis.  All other systems were reviewed and are negative.  Physical Exam: BP 120/84  Pulse 93   Ht 5\' 6"  (1.676 m)  Wt 247 lb 12.8 oz (112.401 kg)  BMI 40.00 kg/m2 She is a pleasant elderly white female in no acute distress. She is normocephalic, atraumatic. Pupils are equal round and reactive to light and accommodation. Extraocular movements are full. Oropharynx is clear. Neck is supple without JVD, adenopathy, thyromegaly, or bruits. Lungs are clear. Cardiac exam reveals an irregular rate and rhythm without gallop, murmur, or click. Abdomen is obese, soft, nontender. She has no masses are passed the megaly. She has trace to 1+ edema. Pulses are good. Skin is warm and dry. She is kyphotic. And walks with a walker. Cranial nerves II through XII are intact. LABORATORY DATA: Atrial fibrillation with a rate of 93 beats per minute. There is nonspecific T-wave abnormality and an incomplete right bundle branch block.  Assessment / Plan:

## 2011-12-03 NOTE — Assessment & Plan Note (Signed)
She has recurrent atrial fibrillation. This was probably triggered by recent pneumonia. She is gone almost 2 years since her cardioversion without recurrence. I do feel that she is symptomatic with increased edema and shortness of breath. I recommended that we go ahead and schedule her for a repeat cardioversion. Her INR was checked today and was therapeutic. If she has recurrent atrial fibrillation we will need to decide whether to put her on long-term antiarrhythmic therapy. I agree with her increased dose of Toprol and the addition of Lasix at this time. We had previously request her records from [place] last year but never received any. We will attempt again. We may need to update her echocardiogram depending on when it was last done.

## 2011-12-03 NOTE — Patient Instructions (Signed)
We will check lab work on you today. If you are therapeutic on coumadin we will go ahead and schedule you for a cardioversion.   Continue your current medication.  Electrical Cardioversion Cardioversion is the delivery of a jolt of electricity to change the rhythm of the heart. Sticky patches or metal paddles are placed on the chest to deliver the electricity from a special device. This is done to restore a normal rhythm. A rhythm that is too fast or not regular keeps the heart from pumping well. Compared to medicines used to change an abnormal rhythm, cardioversion is faster and works better. It is also unpleasant and may dislodge blood clots from the heart. WHEN WOULD THIS BE DONE?  In an emergency:   There is low or no blood pressure as a result of the heart rhythm.   Normal rhythm must be restored as fast as possible to protect the brain and heart from further damage.   It may save a life.   For less serious heart rhythms, such as atrial fibrillation or flutter, in which:   The heart is beating too fast or is not regular.   The heart is still able to pump enough blood, but not as well as it should.   Medicine to change the rhythm has not worked.   It is safe to wait in order to allow time for preparation.  LET YOUR CAREGIVER KNOW ABOUT:   Every medicine you are taking. It is very important to do this! Know when to take or stop taking any of them.   Any time in the past that you have felt your heart was not beating normally.  RISKS AND COMPLICATIONS   Clots may form in the chambers of the heart if it is beating too fast. These clots may be dislodged during the procedure and travel to other parts of the body.   There is risk of a stroke during and after the procedure if a clot moves. Blood thinners lower this risk.   You may have a special test of your heart (TEE) to make sure there are no clots in your heart.  BEFORE THE PROCEDURE   You may have some tests to see how well  your heart is working.   You may start taking blood thinners so your blood does not clot as easily.   Other drugs may be given to help your heart work better.  PROCEDURE (SCHEDULED)  The procedure is typically done in a hospital by a heart doctor (cardiologist).   You will be told when and where to go.   You may be given some medicine through an intravenous (IV) access to reduce discomfort and make you sleepy before the procedure.   Your whole body may move when the shock is delivered. Your chest may feel sore.   You may be able to go home after a few hours. Your heart rhythm will be watched to make sure it does not change.  HOME CARE INSTRUCTIONS   Only take medicine as directed by your caregiver. Be sure you understand how and when to take your medicine.   Learn how to feel your pulse and check it often.   Limit your activity for 48 hours.   Avoid caffeine and other stimulants as directed.  SEEK MEDICAL CARE IF:   You feel like your heart is beating too fast or your pulse is not regular.   You have any questions about your medicines.   You have bleeding that  will not stop.  SEEK IMMEDIATE MEDICAL CARE IF:   You are dizzy or feel faint.   It is hard to breathe or you feel short of breath.   There is a change in discomfort in your chest.   Your speech is slurred or you have trouble moving your arm or leg on one side.   You get a muscle cramp.   Your fingers or toes turn cold or blue.  MAKE SURE YOU:   Understand these instructions.   Will watch your condition.   Will get help right away if you are not doing well or get worse.  Document Released: 06/05/2002 Document Revised: 06/04/2011 Document Reviewed: 10/05/2007 [facility] Patient Information 2012 [place], [place].

## 2011-12-04 ENCOUNTER — Encounter (HOSPITAL_COMMUNITY): Payer: Self-pay | Admitting: Pharmacy Technician

## 2011-12-07 ENCOUNTER — Encounter (HOSPITAL_COMMUNITY): Payer: Self-pay | Admitting: Anesthesiology

## 2011-12-07 ENCOUNTER — Ambulatory Visit (HOSPITAL_COMMUNITY): Payer: Medicare Other | Admitting: Anesthesiology

## 2011-12-07 ENCOUNTER — Ambulatory Visit (HOSPITAL_COMMUNITY)
Admission: RE | Admit: 2011-12-07 | Discharge: 2011-12-07 | Disposition: A | Discharge status: Home / Self Care | Payer: Medicare Other | Source: Ambulatory Visit | Attending: Cardiology | Admitting: Cardiology

## 2011-12-07 ENCOUNTER — Encounter (HOSPITAL_COMMUNITY)
Admission: RE | Disposition: A | Discharge status: Home / Self Care | Payer: Self-pay | Source: Ambulatory Visit | Attending: Cardiology

## 2011-12-07 DIAGNOSIS — I10 Essential (primary) hypertension: Secondary | ICD-10-CM | POA: Diagnosis not present

## 2011-12-07 DIAGNOSIS — E785 Hyperlipidemia, unspecified: Secondary | ICD-10-CM | POA: Diagnosis not present

## 2011-12-07 DIAGNOSIS — R0602 Shortness of breath: Secondary | ICD-10-CM | POA: Diagnosis not present

## 2011-12-07 DIAGNOSIS — J45909 Unspecified asthma, uncomplicated: Secondary | ICD-10-CM | POA: Insufficient documentation

## 2011-12-07 DIAGNOSIS — I4891 Unspecified atrial fibrillation: Secondary | ICD-10-CM

## 2011-12-07 DIAGNOSIS — E119 Type 2 diabetes mellitus without complications: Secondary | ICD-10-CM | POA: Diagnosis not present

## 2011-12-07 DIAGNOSIS — Z7901 Long term (current) use of anticoagulants: Secondary | ICD-10-CM | POA: Insufficient documentation

## 2011-12-07 HISTORY — PX: CARDIOVERSION: SHX1299

## 2011-12-07 LAB — GLUCOSE, CAPILLARY: Glucose-Capillary: 119 mg/dL — ABNORMAL HIGH (ref 70–99)

## 2011-12-07 SURGERY — CARDIOVERSION
Anesthesia: General | Wound class: Clean

## 2011-12-07 MED ORDER — PROPOFOL 10 MG/ML IV BOLUS
INTRAVENOUS | Status: DC | PRN
  Administered 2011-12-07: 10 mg via INTRAVENOUS
  Administered 2011-12-07: 40 mg via INTRAVENOUS

## 2011-12-07 MED ORDER — LIDOCAINE HCL (CARDIAC) 20 MG/ML IV SOLN
INTRAVENOUS | Status: DC | PRN
  Administered 2011-12-07: 50 mg via INTRAVENOUS

## 2011-12-07 MED ORDER — HYDROCORTISONE 1 % EX CREA
TOPICAL_CREAM | Freq: Once | CUTANEOUS | Status: AC
  Administered 2011-12-07: 1 via TOPICAL
  Filled 2011-12-07 (×2): qty 28

## 2011-12-07 MED ORDER — SODIUM CHLORIDE 0.9 % IV SOLN
INTRAVENOUS | Status: DC
  Administered 2011-12-07: 11:00:00 via INTRAVENOUS

## 2011-12-07 NOTE — Anesthesia Preprocedure Evaluation (Addendum)
Anesthesia Evaluation  Patient identified by MRN, date of birth, ID band Patient awake    Reviewed: Allergy & Precautions, H&P , NPO status , Patient's Chart, lab work & pertinent test results  Airway Mallampati: I TM Distance: >3 FB Neck ROM: full    Dental   Pulmonary asthma ,          Cardiovascular hypertension, + dysrhythmias Atrial Fibrillation Rhythm:irregular Rate:Normal     Neuro/Psych    GI/Hepatic   Endo/Other  Diabetes mellitus-, Type 2  Renal/GU      Musculoskeletal   Abdominal   Peds  Hematology   Anesthesia Other Findings   Reproductive/Obstetrics                           Anesthesia Physical Anesthesia Plan  ASA: III  Anesthesia Plan: General   Post-op Pain Management:    Induction: Intravenous  Airway Management Planned: Mask  Additional Equipment:   Intra-op Plan:   Post-operative Plan:   Informed Consent: I have reviewed the patients History and Physical, chart, labs and discussed the procedure including the risks, benefits and alternatives for the proposed anesthesia with the patient or authorized representative who has indicated his/her understanding and acceptance.     Plan Discussed with: CRNA, Anesthesiologist and Surgeon  Anesthesia Plan Comments:         Anesthesia Quick Evaluation

## 2011-12-07 NOTE — H&P (View-Only) (Signed)
 Casey Patel Date of Birth: 11/27/1929   History of Present Illness: Casey Patel is seen today for yearly followup. She states she has had a difficult few months. In January 1 she fell in her bathroom and had a significant scalp laceration requiring  suture. She had a CT scan in the emergency department which showed no intracranial bleeding. She later developed some swelling in her hand and had to have carpal tunnel surgery. More recently she states she caught a common cold. She was seen by Dr. Tisovec on May 28 and it was noted that her pulse was fast and irregular. Her Toprol dose was increased. She was also noted to be hypoxic with a saturation of 80%. She had increase in edema and was placed on Lasix. Chest x-ray demonstrated a right middle lobe infiltrate consistent with pneumonia. She was treated with antibiotics. She is feeling better today. Her edema has improved. She is breathing better but still has a cough and shortness of breath. She does have a prior history of a to fibrillation and underwent successful cardioversion in [place] in July of 2011. She has been on chronic Coumadin therapy and has been therapeutic.  Current Outpatient Prescriptions on File Prior to Visit  Medication Sig Dispense Refill  . albuterol (PROAIR HFA) 108 (90 BASE) MCG/ACT inhaler Inhale 2 puffs into the lungs every 4 (four) hours as needed.        . budesonide (PULMICORT) 180 MCG/ACT inhaler Inhale 2 puffs into the lungs daily.      . cetirizine (ZYRTEC) 5 MG tablet Take 5 mg by mouth daily.      . ferrous gluconate (FERGON) 325 MG tablet Take 325 mg by mouth daily with breakfast.      . furosemide (LASIX) 20 MG tablet Take 20 mg by mouth daily.      . glipiZIDE-metformin (METAGLIP) 2.5-500 MG per tablet Take 1 tablet by mouth 2 (two) times daily before a meal.       . methocarbamol (ROBAXIN) 750 MG tablet Take 750 mg by mouth 3 (three) times daily.       . metoprolol (TOPROL-XL) 100 MG 24 hr tablet Take 150 mg  by mouth daily. One and one half tablet daily      . omeprazole (PRILOSEC) 20 MG capsule Take 20 mg by mouth daily.      . ramipril (ALTACE) 10 MG tablet Take 10 mg by mouth daily.        . rosuvastatin (CRESTOR) 5 MG tablet Take 5 mg by mouth daily.        . warfarin (COUMADIN) 5 MG tablet Take 5 mg by mouth daily. Patient takes 2.5mg on Tuesday and 5mg all other days        Allergies  Allergen Reactions  . Other     Adhesive tap  . Promethazine Hcl     Disorientation   . Tylenol (Acetaminophen)     Causes fast heart rate  . Codeine Itching and Rash    Past Medical History  Diagnosis Date  . Arthritis   . Hypertension   . Hyperlipidemia   . AF (atrial fibrillation)   . Asthma   . Diabetes mellitus     TYPE 2  . PNA (pneumonia)     Past Surgical History  Procedure Date  . Tonsillectomy 1952  . Abdominal hysterectomy 1976  . Cholecystectomy 2003  . Laminectomy 1999    with fusion (L1-5)  . Hand tendon surgery 1999      left wrist - thumb  . Laminectomy 19995    with fusion (L3-4)  . Back surgery     lumb x 3  . Carpal tunnel release 09/15/2011    Procedure: CARPAL TUNNEL RELEASE;  Surgeon: Gary R Kuzma, MD;  Location: [facility];  Service: Orthopedics;  Laterality: Right;    History  Smoking status  . Former Smoker -- 0.8 packs/day for 20 years  . Types: Cigarettes  . Quit date: 06/29/1978  Smokeless tobacco  . Never Used    History  Alcohol Use No    Family History  Problem Relation Age of Onset  . Hypertension Mother 85  . Stroke Mother 85  . Arthritis Mother 85  . Heart attack Father 47  . Heart disease Father 47  . Hypertension Father 47  . Ulcers Father 47  . Heart disease Brother     CONGENTIAL HEART DISEASE  . Heart disease Sister   . Diabetes Sister     Review of Systems: The review of systems is positive for significant arthritis.  All other systems were reviewed and are negative.  Physical Exam: BP 120/84  Pulse 93   Ht 5' 6" (1.676 m)  Wt 247 lb 12.8 oz (112.401 kg)  BMI 40.00 kg/m2 She is a pleasant elderly white female in no acute distress. She is normocephalic, atraumatic. Pupils are equal round and reactive to light and accommodation. Extraocular movements are full. Oropharynx is clear. Neck is supple without JVD, adenopathy, thyromegaly, or bruits. Lungs are clear. Cardiac exam reveals an irregular rate and rhythm without gallop, murmur, or click. Abdomen is obese, soft, nontender. She has no masses are passed the megaly. She has trace to 1+ edema. Pulses are good. Skin is warm and dry. She is kyphotic. And walks with a walker. Cranial nerves II through XII are intact. LABORATORY DATA: Atrial fibrillation with a rate of 93 beats per minute. There is nonspecific T-wave abnormality and an incomplete right bundle branch block.  Assessment / Plan:  

## 2011-12-07 NOTE — Transfer of Care (Signed)
Immediate Anesthesia Transfer of Care Note  Patient: Casey Patel  Procedure(s) Performed: Procedure(s) (LRB): CARDIOVERSION (N/A)  Patient Location: Short Stay  Anesthesia Type: MAC  Level of Consciousness: awake, alert  and oriented  Airway & Oxygen Therapy: Patient Spontanous Breathing and Patient connected to nasal cannula oxygen  Post-op Assessment: Report given to PACU RN, Post -op Vital signs reviewed and stable and Patient moving all extremities X 4  Post vital signs: Reviewed and stable  Complications: No apparent anesthesia complications

## 2011-12-07 NOTE — Interval H&P Note (Signed)
History and Physical Interval Note:  12/07/2011 11:45 AM  Casey Patel  has presented today for surgery, with the diagnosis of AFIB  The various methods of treatment have been discussed with the patient and family. After consideration of risks, benefits and other options for treatment, the patient has consented to  Procedure(s) (LRB): CARDIOVERSION (N/A) as a surgical intervention .  The patients' history has been reviewed, patient examined, no change in status, stable for surgery.  I have reviewed the patients' chart and labs.  Questions were answered to the patient's satisfaction.     Theron Arista [facility]

## 2011-12-07 NOTE — CV Procedure (Signed)
Patient was prepared for cardioversion. All the arrangements were made by Dr. Swaziland. He was stable. She's fully anticoagulated. Quiet time was done and the patient was ready to go.   Anterior posterior pads were placed. Anesthesia was present. The patient received a total of 50 mg of propofol.  The patient received 120 J and remained in atrial fib. A second shock was given with 200 J. She remained in atrial fib. I then gave her a third shock with 200 J with pressure to the chest to compress her chest cavity size. She remained in atrial fibrillation. At that point it was felt most appropriate to leave her in atrial fibrillation. I have been in contact with Dr. Swaziland. The patient will be allowed to discharge home today.   Jerral Bonito, MD

## 2011-12-07 NOTE — Anesthesia Postprocedure Evaluation (Signed)
  Anesthesia Post-op Note  Patient: Chrysten Landstrom  Procedure(s) Performed: Procedure(s) (LRB): CARDIOVERSION (N/A)  Patient Location: Short Stay  Anesthesia Type: General  Level of Consciousness: awake, sedated and patient cooperative  Airway and Oxygen Therapy: Patient Spontanous Breathing and Patient connected to nasal cannula oxygen  Post-op Pain: none  Post-op Assessment: Post-op Vital signs reviewed, Patient's Cardiovascular Status Stable, Respiratory Function Stable, Patent Airway, No signs of Nausea or vomiting and Pain level controlled  Post-op Vital Signs: stable  Complications: No apparent anesthesia complications

## 2011-12-07 NOTE — Progress Notes (Signed)
DR Myrtis Ser NOTIFIED CLIENT AWAKE AND TAKING PO'S AND C/O SOME BURNING ON CHEST WHERE PAD WAS AND ORDERS NOTED

## 2011-12-07 NOTE — Preoperative (Signed)
Beta Blockers   Reason not to administer Beta Blockers:Not Applicable 

## 2011-12-08 ENCOUNTER — Encounter (HOSPITAL_COMMUNITY): Payer: Self-pay | Admitting: Cardiology

## 2011-12-30 DIAGNOSIS — I4891 Unspecified atrial fibrillation: Secondary | ICD-10-CM | POA: Diagnosis not present

## 2011-12-30 DIAGNOSIS — Z7901 Long term (current) use of anticoagulants: Secondary | ICD-10-CM | POA: Diagnosis not present

## 2012-01-01 ENCOUNTER — Encounter: Payer: Self-pay | Admitting: Cardiology

## 2012-01-01 ENCOUNTER — Ambulatory Visit (INDEPENDENT_AMBULATORY_CARE_PROVIDER_SITE_OTHER): Payer: Medicare Other | Admitting: Cardiology

## 2012-01-01 VITALS — BP 118/62 | HR 74 | Ht 66.0 in | Wt 242.0 lb

## 2012-01-01 DIAGNOSIS — I10 Essential (primary) hypertension: Secondary | ICD-10-CM | POA: Diagnosis not present

## 2012-01-01 DIAGNOSIS — I4891 Unspecified atrial fibrillation: Secondary | ICD-10-CM | POA: Diagnosis not present

## 2012-01-01 DIAGNOSIS — R05 Cough: Secondary | ICD-10-CM

## 2012-01-01 NOTE — Assessment & Plan Note (Signed)
She has failed repeat cardioversion. She is asymptomatic at this point. Her heart rate is well controlled. I recommended long-term treatment with rate control and anticoagulation. I do not feel that repeated attempts at cardioversion or antiarrhythmic drugs are going to benefit her.

## 2012-01-01 NOTE — Assessment & Plan Note (Signed)
I suspect her cough is related to her ACE inhibitor. I recommended stopping Altace. We will see if her cough improves. If her blood pressure increases we may need to consider alternative therapy but currently her blood pressure is doing very well.

## 2012-01-01 NOTE — Patient Instructions (Signed)
Stop taking Ramipril (Altace)  Continue your other medication.  I will see you again in 3 months.

## 2012-01-01 NOTE — Progress Notes (Signed)
Casey Patel Date of Birth: 07/09/1929   History of Present Illness: Casey Patel is seen today for followup of atrial fibrillation. Since her last visit she underwent an attempt at DC cardioversion. This was unsuccessful despite multiple attempts was unable to convert. On followup today she is feeling well. Her only complaint is that she has a refractory cough productive of white frothy phlegm. This has been present for at least the past 6 months. Her edema has resolved. Her weight has decreased by 5 pounds. Her breathing is fine. She denies any dizziness or palpitations.  Current Outpatient Prescriptions on File Prior to Visit  Medication Sig Dispense Refill  . albuterol (PROAIR HFA) 108 (90 BASE) MCG/ACT inhaler Inhale 2 puffs into the lungs every 4 (four) hours as needed. For wheezing      . budesonide (PULMICORT) 180 MCG/ACT inhaler Inhale 2 puffs into the lungs daily.      . Cholecalciferol (VITAMIN D) 2000 UNITS CAPS Take 2,000 Units by mouth daily.      . ferrous gluconate (FERGON) 325 MG tablet Take 325 mg by mouth daily with breakfast.      . glyBURIDE-metformin (GLUCOVANCE) 2.5-500 MG per tablet Take 1 tablet by mouth 2 (two) times daily with a meal.      . methocarbamol (ROBAXIN) 750 MG tablet Take 750 mg by mouth 3 (three) times daily.       . metoprolol (TOPROL-XL) 100 MG 24 hr tablet Take 150 mg by mouth daily.       [name] omeprazole (PRILOSEC) 20 MG capsule Take 20 mg by mouth daily.      . rosuvastatin (CRESTOR) 5 MG tablet Take 5 mg by mouth daily.        . traMADol (ULTRAM) 50 MG tablet Take 50 mg by mouth every 6 (six) hours as needed. For pain      . warfarin (COUMADIN) 5 MG tablet Take 2.5-5 mg by mouth daily. Patient takes 2.5mg  on Tuesday and 5mg  all other days        Allergies  Allergen Reactions  . Adhesive (Tape) Other (See Comments)    unknown  . Promethazine Hcl Other (See Comments)    Disorientation   . Tylenol (Acetaminophen) Other (See Comments)    Causes  fast heart rate  . Codeine Itching and Rash    Past Medical History  Diagnosis Date  . Arthritis   . Hypertension   . Hyperlipidemia   . AF (atrial fibrillation)   . Asthma   . Diabetes mellitus     TYPE 2  . PNA (pneumonia)     Past Surgical History  Procedure Date  . Tonsillectomy 1952  . Abdominal hysterectomy 1976  . Cholecystectomy 2003  . Laminectomy 1999    with fusion (L1-5)  . Hand tendon surgery 1999    left wrist - thumb  . Laminectomy [id number]    with fusion (L3-4)  . Back surgery     lumb x 3  . Carpal tunnel release 09/15/2011    Procedure: CARPAL TUNNEL RELEASE;  Surgeon: Nicki Reaper, MD;  Location: [facility];  Service: Orthopedics;  Laterality: Right;  . Cardioversion 12/07/2011    Procedure: CARDIOVERSION;  Surgeon: Peter M Swaziland, MD;  Location: [facility] OR;  Service: Cardiovascular;  Laterality: N/A;    History  Smoking status  . Former Smoker -- 0.8 packs/day for 20 years  . Types: Cigarettes  . Quit date: 06/29/1978  Smokeless tobacco  . Never Used  History  Alcohol Use No    Family History  Problem Relation Age of Onset  . Hypertension Mother 32  . Stroke Mother 71  . Arthritis Mother 60  . Heart attack Father 63  . Heart disease Father 25  . Hypertension Father 19  . Ulcers Father 88  . Heart disease Brother     CONGENTIAL HEART DISEASE  . Heart disease Sister   . Diabetes Sister     Review of Systems: The review of systems is positive for significant arthritis.  All other systems were reviewed and are negative.  Physical Exam: BP 118/62  Pulse 74  Ht 5\' 6"  (1.676 m)  Wt 109.77 kg (242 lb)  BMI 39.06 kg/m2 She is a pleasant elderly white female in no acute distress. She is normocephalic, atraumatic. Pupils are equal round and reactive to light and accommodation. Extraocular movements are full. Oropharynx is clear. Neck is supple without JVD, adenopathy, thyromegaly, or bruits. Lungs are clear. Cardiac exam reveals  an irregular rate and rhythm without gallop, murmur, or click. Abdomen is obese, soft, nontender. She has no masses are passed the megaly. She has trace to 1+ edema. Pulses are good. Skin is warm and dry. She is kyphotic. And walks with a walker. Cranial nerves II through XII are intact. LABORATORY DATA: Atrial fibrillation with a rate of 74 beats per minute. There is nonspecific ST-T abnormality  Assessment / Plan:

## 2012-01-05 DIAGNOSIS — R05 Cough: Secondary | ICD-10-CM | POA: Diagnosis not present

## 2012-01-26 ENCOUNTER — Other Ambulatory Visit: Payer: Self-pay | Admitting: Internal Medicine

## 2012-01-26 DIAGNOSIS — Z1231 Encounter for screening mammogram for malignant neoplasm of breast: Secondary | ICD-10-CM

## 2012-02-02 DIAGNOSIS — I4891 Unspecified atrial fibrillation: Secondary | ICD-10-CM | POA: Diagnosis not present

## 2012-02-02 DIAGNOSIS — Z7901 Long term (current) use of anticoagulants: Secondary | ICD-10-CM | POA: Diagnosis not present

## 2012-02-12 ENCOUNTER — Ambulatory Visit
Admission: RE | Admit: 2012-02-12 | Discharge: 2012-02-12 | Disposition: A | Discharge status: Home / Self Care | Payer: Medicare Other | Source: Ambulatory Visit | Attending: Internal Medicine | Admitting: Internal Medicine

## 2012-02-12 DIAGNOSIS — Z1231 Encounter for screening mammogram for malignant neoplasm of breast: Secondary | ICD-10-CM

## 2012-03-08 DIAGNOSIS — I4891 Unspecified atrial fibrillation: Secondary | ICD-10-CM | POA: Diagnosis not present

## 2012-03-08 DIAGNOSIS — Z7901 Long term (current) use of anticoagulants: Secondary | ICD-10-CM | POA: Diagnosis not present

## 2012-03-24 DIAGNOSIS — E118 Type 2 diabetes mellitus with unspecified complications: Secondary | ICD-10-CM | POA: Diagnosis not present

## 2012-03-24 DIAGNOSIS — I509 Heart failure, unspecified: Secondary | ICD-10-CM | POA: Diagnosis not present

## 2012-03-24 DIAGNOSIS — I4891 Unspecified atrial fibrillation: Secondary | ICD-10-CM | POA: Diagnosis not present

## 2012-03-24 DIAGNOSIS — M1990 Unspecified osteoarthritis, unspecified site: Secondary | ICD-10-CM | POA: Diagnosis not present

## 2012-03-24 DIAGNOSIS — I10 Essential (primary) hypertension: Secondary | ICD-10-CM | POA: Diagnosis not present

## 2012-03-24 DIAGNOSIS — Z23 Encounter for immunization: Secondary | ICD-10-CM | POA: Diagnosis not present

## 2012-03-24 DIAGNOSIS — E785 Hyperlipidemia, unspecified: Secondary | ICD-10-CM | POA: Diagnosis not present

## 2012-04-01 ENCOUNTER — Ambulatory Visit: Payer: Medicare Other | Admitting: Cardiology

## 2012-04-07 DIAGNOSIS — Z7901 Long term (current) use of anticoagulants: Secondary | ICD-10-CM | POA: Diagnosis not present

## 2012-04-07 DIAGNOSIS — I4891 Unspecified atrial fibrillation: Secondary | ICD-10-CM | POA: Diagnosis not present

## 2012-04-18 ENCOUNTER — Ambulatory Visit (INDEPENDENT_AMBULATORY_CARE_PROVIDER_SITE_OTHER): Payer: Medicare Other | Admitting: Cardiology

## 2012-04-18 ENCOUNTER — Encounter: Payer: Self-pay | Admitting: Cardiology

## 2012-04-18 VITALS — BP 122/86 | HR 80 | Ht 66.0 in | Wt 246.0 lb

## 2012-04-18 DIAGNOSIS — I4891 Unspecified atrial fibrillation: Secondary | ICD-10-CM

## 2012-04-18 DIAGNOSIS — I10 Essential (primary) hypertension: Secondary | ICD-10-CM

## 2012-04-18 DIAGNOSIS — E785 Hyperlipidemia, unspecified: Secondary | ICD-10-CM

## 2012-04-18 NOTE — Patient Instructions (Addendum)
Continue your current therapy  I will see you again in one year.   

## 2012-04-18 NOTE — Progress Notes (Signed)
Casey Patel Date of Birth: 23-Jan-1930   History of Present Illness: Casey Patel is seen today for followup of atrial fibrillation. She has failed multiple attempts at cardioversion before. She is being managed with rate control and anticoagulation. Since her last visit her cough has resolved with stopping her Altace. She states she feels well. INR was 2.3 recently. She denies any palpitations or tachycardia. She does have some mild chronic shortness of breath.  Current Outpatient Prescriptions on File Prior to Visit  Medication Sig Dispense Refill  . albuterol (PROAIR HFA) 108 (90 BASE) MCG/ACT inhaler Inhale 2 puffs into the lungs every 4 (four) hours as needed. For wheezing      . budesonide (PULMICORT) 180 MCG/ACT inhaler Inhale 2 puffs into the lungs daily.      . Cholecalciferol (VITAMIN D) 2000 UNITS CAPS Take 2,000 Units by mouth daily.      Tery Sanfilippo Calcium (STOOL SOFTENER PO) Take by mouth as directed.      . furosemide (LASIX) 20 MG tablet Take 20 mg by mouth Daily.      [name] glyBURIDE-metformin (GLUCOVANCE) 2.5-500 MG per tablet Take 1 tablet by mouth 2 (two) times daily with a meal.      . methocarbamol (ROBAXIN) 750 MG tablet Take 750 mg by mouth 3 (three) times daily.       . metoprolol (TOPROL-XL) 100 MG 24 hr tablet Take 150 mg by mouth daily.       [name] omeprazole (PRILOSEC) 20 MG capsule Take 20 mg by mouth daily.      . rosuvastatin (CRESTOR) 5 MG tablet Take 5 mg by mouth daily.        . traMADol (ULTRAM) 50 MG tablet Take 50 mg by mouth every 6 (six) hours as needed. For pain      . warfarin (COUMADIN) 5 MG tablet Take 2.5-5 mg by mouth daily. Patient takes 2.5mg  on Tuesday and 5mg  all other days        Allergies  Allergen Reactions  . Adhesive (Tape) Other (See Comments)    unknown  . Promethazine Hcl Other (See Comments)    Disorientation   . Tylenol (Acetaminophen) Other (See Comments)    Causes fast heart rate  . Codeine Itching and Rash    Past Medical  History  Diagnosis Date  . Arthritis   . Hypertension   . Hyperlipidemia   . AF (atrial fibrillation)   . Asthma   . Diabetes mellitus     TYPE 2  . PNA (pneumonia)     Past Surgical History  Procedure Date  . Tonsillectomy 1952  . Abdominal hysterectomy 1976  . Cholecystectomy 2003  . Laminectomy 1999    with fusion (L1-5)  . Hand tendon surgery 1999    left wrist - thumb  . Laminectomy [id number]    with fusion (L3-4)  . Back surgery     lumb x 3  . Carpal tunnel release 09/15/2011    Procedure: CARPAL TUNNEL RELEASE;  Surgeon: Nicki Reaper, MD;  Location: [facility];  Service: Orthopedics;  Laterality: Right;  . Cardioversion 12/07/2011    Procedure: CARDIOVERSION;  Surgeon: Xsavier Seeley M Swaziland, MD;  Location: [facility] OR;  Service: Cardiovascular;  Laterality: N/A;    History  Smoking status  . Former Smoker -- 0.8 packs/day for 20 years  . Types: Cigarettes  . Quit date: 06/29/1978  Smokeless tobacco  . Never Used    History  Alcohol Use No  Family History  Problem Relation Age of Onset  . Hypertension Mother 1  . Stroke Mother 34  . Arthritis Mother 60  . Heart attack Father 41  . Heart disease Father 34  . Hypertension Father 16  . Ulcers Father 53  . Heart disease Brother     CONGENTIAL HEART DISEASE  . Heart disease Sister   . Diabetes Sister     Review of Systems: The review of systems is positive for significant arthritis.  her lower extremity swelling has been less with cooler weather.  All other systems were reviewed and are negative.  Physical Exam: BP 122/86  Pulse 80  Ht 5\' 6"  (1.676 m)  Wt 111.585 kg (246 lb)  BMI 39.71 kg/m2 She is a pleasant elderly white female in no acute distress. She is normocephalic, atraumatic.  HEENT exam is unremarkable.  Neck is supple without JVD, adenopathy, thyromegaly, or bruits. Lungs are clear. Cardiac exam reveals an irregular rate and rhythm without gallop, murmur, or click. Abdomen is obese,  soft, nontender. She has no masses. She has trace to 1+ edema. Pulses are good. Skin is warm and dry. She is kyphotic.  She is in a wheelchair today.  Cranial nerves II through XII are intact. LABORATORY DATA:   Assessment / Plan: 1. Atrial fibrillation, permanent. Rate is well controlled and she is anticoagulated appropriately with Coumadin. I'll followup again in one year.  2. Hypertension, well controlled. She is off ACE inhibitor because of cough.

## 2012-05-11 DIAGNOSIS — I4891 Unspecified atrial fibrillation: Secondary | ICD-10-CM | POA: Diagnosis not present

## 2012-05-11 DIAGNOSIS — Z7901 Long term (current) use of anticoagulants: Secondary | ICD-10-CM | POA: Diagnosis not present

## 2012-05-30 DIAGNOSIS — I4891 Unspecified atrial fibrillation: Secondary | ICD-10-CM | POA: Diagnosis not present

## 2012-05-30 DIAGNOSIS — I509 Heart failure, unspecified: Secondary | ICD-10-CM | POA: Diagnosis not present

## 2012-05-30 DIAGNOSIS — J45909 Unspecified asthma, uncomplicated: Secondary | ICD-10-CM | POA: Diagnosis not present

## 2012-05-30 DIAGNOSIS — R0602 Shortness of breath: Secondary | ICD-10-CM | POA: Diagnosis not present

## 2012-06-14 DIAGNOSIS — I4891 Unspecified atrial fibrillation: Secondary | ICD-10-CM | POA: Diagnosis not present

## 2012-06-14 DIAGNOSIS — Z7901 Long term (current) use of anticoagulants: Secondary | ICD-10-CM | POA: Diagnosis not present

## 2012-07-20 DIAGNOSIS — I4891 Unspecified atrial fibrillation: Secondary | ICD-10-CM | POA: Diagnosis not present

## 2012-07-20 DIAGNOSIS — Z7901 Long term (current) use of anticoagulants: Secondary | ICD-10-CM | POA: Diagnosis not present

## 2012-08-23 DIAGNOSIS — I4891 Unspecified atrial fibrillation: Secondary | ICD-10-CM | POA: Diagnosis not present

## 2012-08-23 DIAGNOSIS — Z7901 Long term (current) use of anticoagulants: Secondary | ICD-10-CM | POA: Diagnosis not present

## 2012-09-21 DIAGNOSIS — E118 Type 2 diabetes mellitus with unspecified complications: Secondary | ICD-10-CM | POA: Diagnosis not present

## 2012-09-21 DIAGNOSIS — I10 Essential (primary) hypertension: Secondary | ICD-10-CM | POA: Diagnosis not present

## 2012-09-21 DIAGNOSIS — I4891 Unspecified atrial fibrillation: Secondary | ICD-10-CM | POA: Diagnosis not present

## 2012-09-21 DIAGNOSIS — I509 Heart failure, unspecified: Secondary | ICD-10-CM | POA: Diagnosis not present

## 2012-09-21 DIAGNOSIS — J45909 Unspecified asthma, uncomplicated: Secondary | ICD-10-CM | POA: Diagnosis not present

## 2012-09-21 DIAGNOSIS — Z7901 Long term (current) use of anticoagulants: Secondary | ICD-10-CM | POA: Diagnosis not present

## 2012-09-21 DIAGNOSIS — E785 Hyperlipidemia, unspecified: Secondary | ICD-10-CM | POA: Diagnosis not present

## 2012-09-21 DIAGNOSIS — M1990 Unspecified osteoarthritis, unspecified site: Secondary | ICD-10-CM | POA: Diagnosis not present

## 2012-10-25 DIAGNOSIS — I4891 Unspecified atrial fibrillation: Secondary | ICD-10-CM | POA: Diagnosis not present

## 2012-10-25 DIAGNOSIS — Z7901 Long term (current) use of anticoagulants: Secondary | ICD-10-CM | POA: Diagnosis not present

## 2012-11-30 DIAGNOSIS — I4891 Unspecified atrial fibrillation: Secondary | ICD-10-CM | POA: Diagnosis not present

## 2012-11-30 DIAGNOSIS — Z7901 Long term (current) use of anticoagulants: Secondary | ICD-10-CM | POA: Diagnosis not present

## 2012-12-27 DIAGNOSIS — J45909 Unspecified asthma, uncomplicated: Secondary | ICD-10-CM | POA: Diagnosis not present

## 2012-12-27 DIAGNOSIS — Z1331 Encounter for screening for depression: Secondary | ICD-10-CM | POA: Diagnosis not present

## 2012-12-27 DIAGNOSIS — I4891 Unspecified atrial fibrillation: Secondary | ICD-10-CM | POA: Diagnosis not present

## 2012-12-27 DIAGNOSIS — Z7901 Long term (current) use of anticoagulants: Secondary | ICD-10-CM | POA: Diagnosis not present

## 2012-12-27 DIAGNOSIS — M1990 Unspecified osteoarthritis, unspecified site: Secondary | ICD-10-CM | POA: Diagnosis not present

## 2012-12-27 DIAGNOSIS — I509 Heart failure, unspecified: Secondary | ICD-10-CM | POA: Diagnosis not present

## 2012-12-27 DIAGNOSIS — E785 Hyperlipidemia, unspecified: Secondary | ICD-10-CM | POA: Diagnosis not present

## 2012-12-27 DIAGNOSIS — E118 Type 2 diabetes mellitus with unspecified complications: Secondary | ICD-10-CM | POA: Diagnosis not present

## 2012-12-27 DIAGNOSIS — I10 Essential (primary) hypertension: Secondary | ICD-10-CM | POA: Diagnosis not present

## 2013-01-09 ENCOUNTER — Other Ambulatory Visit: Payer: Self-pay | Admitting: Internal Medicine

## 2013-01-09 DIAGNOSIS — Z1231 Encounter for screening mammogram for malignant neoplasm of breast: Secondary | ICD-10-CM

## 2013-01-10 DIAGNOSIS — IMO0002 Reserved for concepts with insufficient information to code with codable children: Secondary | ICD-10-CM | POA: Diagnosis not present

## 2013-01-10 DIAGNOSIS — H2510 Age-related nuclear cataract, unspecified eye: Secondary | ICD-10-CM | POA: Diagnosis not present

## 2013-01-26 DIAGNOSIS — Z7901 Long term (current) use of anticoagulants: Secondary | ICD-10-CM | POA: Diagnosis not present

## 2013-01-26 DIAGNOSIS — H25019 Cortical age-related cataract, unspecified eye: Secondary | ICD-10-CM | POA: Diagnosis not present

## 2013-01-26 DIAGNOSIS — H2510 Age-related nuclear cataract, unspecified eye: Secondary | ICD-10-CM | POA: Diagnosis not present

## 2013-01-26 DIAGNOSIS — I4891 Unspecified atrial fibrillation: Secondary | ICD-10-CM | POA: Diagnosis not present

## 2013-01-26 DIAGNOSIS — H04129 Dry eye syndrome of unspecified lacrimal gland: Secondary | ICD-10-CM | POA: Diagnosis not present

## 2013-02-14 ENCOUNTER — Ambulatory Visit
Admission: RE | Admit: 2013-02-14 | Discharge: 2013-02-14 | Disposition: A | Discharge status: Home / Self Care | Payer: Medicare Other | Source: Ambulatory Visit | Attending: Internal Medicine | Admitting: Internal Medicine

## 2013-02-14 DIAGNOSIS — Z1231 Encounter for screening mammogram for malignant neoplasm of breast: Secondary | ICD-10-CM

## 2013-02-23 DIAGNOSIS — I4891 Unspecified atrial fibrillation: Secondary | ICD-10-CM | POA: Diagnosis not present

## 2013-02-23 DIAGNOSIS — Z7901 Long term (current) use of anticoagulants: Secondary | ICD-10-CM | POA: Diagnosis not present

## 2013-03-06 DIAGNOSIS — H2510 Age-related nuclear cataract, unspecified eye: Secondary | ICD-10-CM | POA: Diagnosis not present

## 2013-03-06 DIAGNOSIS — H25019 Cortical age-related cataract, unspecified eye: Secondary | ICD-10-CM | POA: Diagnosis not present

## 2013-03-06 DIAGNOSIS — H269 Unspecified cataract: Secondary | ICD-10-CM | POA: Diagnosis not present

## 2013-03-22 DIAGNOSIS — Z961 Presence of intraocular lens: Secondary | ICD-10-CM | POA: Diagnosis not present

## 2013-04-03 DIAGNOSIS — J45909 Unspecified asthma, uncomplicated: Secondary | ICD-10-CM | POA: Diagnosis not present

## 2013-04-03 DIAGNOSIS — M1990 Unspecified osteoarthritis, unspecified site: Secondary | ICD-10-CM | POA: Diagnosis not present

## 2013-04-03 DIAGNOSIS — Z7901 Long term (current) use of anticoagulants: Secondary | ICD-10-CM | POA: Diagnosis not present

## 2013-04-03 DIAGNOSIS — Z23 Encounter for immunization: Secondary | ICD-10-CM | POA: Diagnosis not present

## 2013-04-03 DIAGNOSIS — I4891 Unspecified atrial fibrillation: Secondary | ICD-10-CM | POA: Diagnosis not present

## 2013-04-03 DIAGNOSIS — E118 Type 2 diabetes mellitus with unspecified complications: Secondary | ICD-10-CM | POA: Diagnosis not present

## 2013-04-03 DIAGNOSIS — I509 Heart failure, unspecified: Secondary | ICD-10-CM | POA: Diagnosis not present

## 2013-04-03 DIAGNOSIS — E785 Hyperlipidemia, unspecified: Secondary | ICD-10-CM | POA: Diagnosis not present

## 2013-04-03 DIAGNOSIS — I10 Essential (primary) hypertension: Secondary | ICD-10-CM | POA: Diagnosis not present

## 2013-04-05 DIAGNOSIS — H2510 Age-related nuclear cataract, unspecified eye: Secondary | ICD-10-CM | POA: Diagnosis not present

## 2013-04-10 DIAGNOSIS — H2510 Age-related nuclear cataract, unspecified eye: Secondary | ICD-10-CM | POA: Diagnosis not present

## 2013-04-10 DIAGNOSIS — H25019 Cortical age-related cataract, unspecified eye: Secondary | ICD-10-CM | POA: Diagnosis not present

## 2013-04-11 DIAGNOSIS — Z961 Presence of intraocular lens: Secondary | ICD-10-CM | POA: Diagnosis not present

## 2013-04-19 ENCOUNTER — Ambulatory Visit (INDEPENDENT_AMBULATORY_CARE_PROVIDER_SITE_OTHER): Payer: Medicare Other | Admitting: Cardiology

## 2013-04-19 ENCOUNTER — Encounter: Payer: Self-pay | Admitting: Cardiology

## 2013-04-19 VITALS — BP 112/60 | HR 92 | Ht 66.0 in | Wt 216.8 lb

## 2013-04-19 DIAGNOSIS — E785 Hyperlipidemia, unspecified: Secondary | ICD-10-CM

## 2013-04-19 DIAGNOSIS — I10 Essential (primary) hypertension: Secondary | ICD-10-CM | POA: Diagnosis not present

## 2013-04-19 DIAGNOSIS — I4891 Unspecified atrial fibrillation: Secondary | ICD-10-CM | POA: Diagnosis not present

## 2013-04-19 NOTE — Progress Notes (Signed)
Casey Patel Date of Birth: 02-Dec-1929   History of Present Illness: Casey Patel is seen today for followup of atrial fibrillation. She has failed multiple attempts at cardioversion before. She is being managed with rate control and anticoagulation. Since her last visit one year ago she reports no new medical problems. She did have cataract surgery. She has lost 30 pounds by using a weight watchers regimen. She reports her Coumadin checks have gone well. She denies any palpitations, dizziness, shortness of breath, or chest pain.  Current Outpatient Prescriptions on File Prior to Visit  Medication Sig Dispense Refill  . albuterol (PROAIR HFA) 108 (90 BASE) MCG/ACT inhaler Inhale 2 puffs into the lungs every 4 (four) hours as needed. For wheezing      . budesonide (PULMICORT) 180 MCG/ACT inhaler Inhale 2 puffs into the lungs daily. ASTHMA X      . Cholecalciferol (VITAMIN D) 2000 UNITS CAPS Take 2,000 Units by mouth daily.      Casey Patel Calcium (STOOL SOFTENER PO) Take by mouth as directed.      . furosemide (LASIX) 20 MG tablet Take 20 mg by mouth Daily.      [name] glyBURIDE-metformin (GLUCOVANCE) 2.5-500 MG per tablet Take 1 tablet by mouth 2 (two) times daily with a meal.      . methocarbamol (ROBAXIN) 750 MG tablet Take 750 mg by mouth 3 (three) times daily.       . metoprolol (TOPROL-XL) 100 MG 24 hr tablet Take 150 mg by mouth daily.       [name] omeprazole (PRILOSEC) 20 MG capsule Take 20 mg by mouth daily.      . rosuvastatin (CRESTOR) 5 MG tablet Take 5 mg by mouth daily.        . traMADol (ULTRAM) 50 MG tablet Take 50 mg by mouth every 6 (six) hours as needed. For pain      . warfarin (COUMADIN) 5 MG tablet Take 2.5-5 mg by mouth daily. Patient takes 2.5mg  on Tuesday and 5mg  all other days       No current facility-administered medications on file prior to visit.    Allergies  Allergen Reactions  . Adhesive [Tape] Other (See Comments)    unknown  . Promethazine Hcl Other (See  Comments)    Disorientation   . Tylenol [Acetaminophen] Other (See Comments)    Causes fast heart rate  . Codeine Itching and Rash    Past Medical History  Diagnosis Date  . Arthritis   . Hypertension   . Hyperlipidemia   . AF (atrial fibrillation)   . Asthma   . Diabetes mellitus     TYPE 2  . PNA (pneumonia)     Past Surgical History  Procedure Laterality Date  . Tonsillectomy  1952  . Abdominal hysterectomy  1976  . Cholecystectomy  2003  . Laminectomy  1999    with fusion (L1-5)  . Hand tendon surgery  1999    left wrist - thumb  . Laminectomy  [id number]    with fusion (L3-4)  . Back surgery      lumb x 3  . Carpal tunnel release  09/15/2011    Procedure: CARPAL TUNNEL RELEASE;  Surgeon: Nicki Reaper, MD;  Location: [facility];  Service: Orthopedics;  Laterality: Right;  . Cardioversion  12/07/2011    Procedure: CARDIOVERSION;  Surgeon: Jory Welke M Swaziland, MD;  Location: [facility] OR;  Service: Cardiovascular;  Laterality: N/A;  . Cataract extraction  History  Smoking status  . Former Smoker -- 0.80 packs/day for 20 years  . Types: Cigarettes  . Quit date: 06/29/1978  Smokeless tobacco  . Never Used    History  Alcohol Use No    Family History  Problem Relation Age of Onset  . Hypertension Mother 59  . Stroke Mother 18  . Arthritis Mother 61  . Heart attack Father 55  . Heart disease Father 58  . Hypertension Father 46  . Ulcers Father 68  . Heart disease Brother     CONGENTIAL HEART DISEASE  . Heart disease Sister   . Diabetes Sister     Review of Systems:  As noted in history of present illness. All other systems were reviewed and are negative.  Physical Exam: BP 112/60  Pulse 92  Ht 5\' 6"  (1.676 m)  Wt 216 lb 12.8 oz (98.34 kg)  BMI 35.01 kg/m2 She is a pleasant elderly white female in no acute distress. She is seen in a wheelchair.  HEENT exam is unremarkable.  Neck is supple without JVD, adenopathy, thyromegaly, or bruits. Lungs  are clear. Cardiac exam reveals an irregular rate and rhythm without gallop, murmur, or click. Abdomen is obese, soft, nontender. She has no masses. She has trace  edema. Pulses are good. Skin is warm and dry. She is kyphotic.  She is in a wheelchair today.  Cranial nerves II through XII are intact.  LABORATORY DATA: ECG today demonstrates atrial fibrillation with a rate of 86 beats per minute. There is low voltage. There is nonspecific ST-T wave abnormality.  Assessment / Plan: 1. Atrial fibrillation, permanent. Rate is well controlled and she is anticoagulated appropriately with Coumadin. I'll followup again in one year.  2. Hypertension, well controlled.

## 2013-04-19 NOTE — Patient Instructions (Signed)
Continue your current therapy  I will see you again in one year.   

## 2013-05-03 DIAGNOSIS — I4891 Unspecified atrial fibrillation: Secondary | ICD-10-CM | POA: Diagnosis not present

## 2013-05-03 DIAGNOSIS — Z7901 Long term (current) use of anticoagulants: Secondary | ICD-10-CM | POA: Diagnosis not present

## 2013-07-03 DIAGNOSIS — R059 Cough, unspecified: Secondary | ICD-10-CM | POA: Diagnosis not present

## 2013-07-03 DIAGNOSIS — J209 Acute bronchitis, unspecified: Secondary | ICD-10-CM | POA: Diagnosis not present

## 2013-07-03 DIAGNOSIS — R05 Cough: Secondary | ICD-10-CM | POA: Diagnosis not present

## 2013-07-03 DIAGNOSIS — Z6834 Body mass index (BMI) 34.0-34.9, adult: Secondary | ICD-10-CM | POA: Diagnosis not present

## 2013-07-03 DIAGNOSIS — J45909 Unspecified asthma, uncomplicated: Secondary | ICD-10-CM | POA: Diagnosis not present

## 2013-07-13 DIAGNOSIS — I4891 Unspecified atrial fibrillation: Secondary | ICD-10-CM | POA: Diagnosis not present

## 2013-07-13 DIAGNOSIS — Z7901 Long term (current) use of anticoagulants: Secondary | ICD-10-CM | POA: Diagnosis not present

## 2013-07-26 DIAGNOSIS — I10 Essential (primary) hypertension: Secondary | ICD-10-CM | POA: Diagnosis not present

## 2013-07-26 DIAGNOSIS — Z7901 Long term (current) use of anticoagulants: Secondary | ICD-10-CM | POA: Diagnosis not present

## 2013-07-26 DIAGNOSIS — I509 Heart failure, unspecified: Secondary | ICD-10-CM | POA: Diagnosis not present

## 2013-07-26 DIAGNOSIS — E118 Type 2 diabetes mellitus with unspecified complications: Secondary | ICD-10-CM | POA: Diagnosis not present

## 2013-07-26 DIAGNOSIS — M1990 Unspecified osteoarthritis, unspecified site: Secondary | ICD-10-CM | POA: Diagnosis not present

## 2013-07-26 DIAGNOSIS — Z23 Encounter for immunization: Secondary | ICD-10-CM | POA: Diagnosis not present

## 2013-07-26 DIAGNOSIS — I4891 Unspecified atrial fibrillation: Secondary | ICD-10-CM | POA: Diagnosis not present

## 2013-07-26 DIAGNOSIS — J45909 Unspecified asthma, uncomplicated: Secondary | ICD-10-CM | POA: Diagnosis not present

## 2013-07-26 DIAGNOSIS — E785 Hyperlipidemia, unspecified: Secondary | ICD-10-CM | POA: Diagnosis not present

## 2013-08-30 DIAGNOSIS — I4891 Unspecified atrial fibrillation: Secondary | ICD-10-CM | POA: Diagnosis not present

## 2013-08-30 DIAGNOSIS — Z7901 Long term (current) use of anticoagulants: Secondary | ICD-10-CM | POA: Diagnosis not present

## 2013-10-04 DIAGNOSIS — Z7901 Long term (current) use of anticoagulants: Secondary | ICD-10-CM | POA: Diagnosis not present

## 2013-10-04 DIAGNOSIS — I4891 Unspecified atrial fibrillation: Secondary | ICD-10-CM | POA: Diagnosis not present

## 2013-10-30 DIAGNOSIS — Z7901 Long term (current) use of anticoagulants: Secondary | ICD-10-CM | POA: Diagnosis not present

## 2013-10-30 DIAGNOSIS — J45909 Unspecified asthma, uncomplicated: Secondary | ICD-10-CM | POA: Diagnosis not present

## 2013-10-30 DIAGNOSIS — I4891 Unspecified atrial fibrillation: Secondary | ICD-10-CM | POA: Diagnosis not present

## 2013-10-30 DIAGNOSIS — M1990 Unspecified osteoarthritis, unspecified site: Secondary | ICD-10-CM | POA: Diagnosis not present

## 2013-10-30 DIAGNOSIS — E785 Hyperlipidemia, unspecified: Secondary | ICD-10-CM | POA: Diagnosis not present

## 2013-10-30 DIAGNOSIS — I509 Heart failure, unspecified: Secondary | ICD-10-CM | POA: Diagnosis not present

## 2013-10-30 DIAGNOSIS — E118 Type 2 diabetes mellitus with unspecified complications: Secondary | ICD-10-CM | POA: Diagnosis not present

## 2013-10-30 DIAGNOSIS — I10 Essential (primary) hypertension: Secondary | ICD-10-CM | POA: Diagnosis not present

## 2013-11-28 DIAGNOSIS — I4891 Unspecified atrial fibrillation: Secondary | ICD-10-CM | POA: Diagnosis not present

## 2013-11-28 DIAGNOSIS — Z7901 Long term (current) use of anticoagulants: Secondary | ICD-10-CM | POA: Diagnosis not present

## 2014-01-03 DIAGNOSIS — Z7901 Long term (current) use of anticoagulants: Secondary | ICD-10-CM | POA: Diagnosis not present

## 2014-01-03 DIAGNOSIS — I4891 Unspecified atrial fibrillation: Secondary | ICD-10-CM | POA: Diagnosis not present

## 2014-01-19 ENCOUNTER — Other Ambulatory Visit: Payer: Self-pay

## 2014-01-19 DIAGNOSIS — Z1231 Encounter for screening mammogram for malignant neoplasm of breast: Secondary | ICD-10-CM

## 2014-02-15 DIAGNOSIS — Z7901 Long term (current) use of anticoagulants: Secondary | ICD-10-CM | POA: Diagnosis not present

## 2014-02-15 DIAGNOSIS — I4891 Unspecified atrial fibrillation: Secondary | ICD-10-CM | POA: Diagnosis not present

## 2014-02-19 ENCOUNTER — Ambulatory Visit
Admission: RE | Admit: 2014-02-19 | Discharge: 2014-02-19 | Disposition: A | Discharge status: Home / Self Care | Payer: Medicare Other | Source: Ambulatory Visit

## 2014-02-19 DIAGNOSIS — Z1231 Encounter for screening mammogram for malignant neoplasm of breast: Secondary | ICD-10-CM | POA: Diagnosis not present

## 2014-02-21 DIAGNOSIS — D485 Neoplasm of uncertain behavior of skin: Secondary | ICD-10-CM | POA: Diagnosis not present

## 2014-03-09 ENCOUNTER — Ambulatory Visit (INDEPENDENT_AMBULATORY_CARE_PROVIDER_SITE_OTHER): Payer: Medicare Other | Admitting: Cardiology

## 2014-03-09 ENCOUNTER — Encounter: Payer: Self-pay | Admitting: Cardiology

## 2014-03-09 VITALS — BP 120/84 | HR 93 | Ht 66.0 in | Wt 207.8 lb

## 2014-03-09 DIAGNOSIS — I10 Essential (primary) hypertension: Secondary | ICD-10-CM | POA: Diagnosis not present

## 2014-03-09 DIAGNOSIS — I4891 Unspecified atrial fibrillation: Secondary | ICD-10-CM | POA: Diagnosis not present

## 2014-03-09 DIAGNOSIS — E785 Hyperlipidemia, unspecified: Secondary | ICD-10-CM | POA: Diagnosis not present

## 2014-03-09 NOTE — Patient Instructions (Signed)
Continue your current therapy  I will see you in one year   

## 2014-03-09 NOTE — Progress Notes (Signed)
Casey Patel Date of Birth: 1929-07-27   History of Present Illness: Casey Patel is seen today for followup of atrial fibrillation. She has failed multiple attempts at cardioversion before. She is being managed with rate control and anticoagulation. Since her last visit one year ago she reports no new medical problems.  She has lost an additional 9  Pounds. She reports her Coumadin checks have gone well. She denies any palpitations, dizziness, shortness of breath, or chest pain. She has developed severe cervical kyphosis and is seen in a wheelchair.  Current Outpatient Prescriptions on File Prior to Visit  Medication Sig Dispense Refill  . albuterol (PROAIR HFA) 108 (90 BASE) MCG/ACT inhaler Inhale 2 puffs into the lungs every 4 (four) hours as needed. For wheezing      . budesonide (PULMICORT) 180 MCG/ACT inhaler Inhale 2 puffs into the lungs daily. ASTHMA X      . Cholecalciferol (VITAMIN D) 2000 UNITS CAPS Take 2,000 Units by mouth daily.      [name] Calcium (STOOL SOFTENER PO) Take by mouth as directed.      . furosemide (LASIX) 20 MG tablet Take 20 mg by mouth Daily.      [name] glyBURIDE-metformin (GLUCOVANCE) 2.5-500 MG per tablet Take 1 tablet by mouth 2 (two) times daily with a meal.      . methocarbamol (ROBAXIN) 750 MG tablet Take 750 mg by mouth 3 (three) times daily.       . metoprolol (TOPROL-XL) 100 MG 24 hr tablet Take 150 mg by mouth daily.       [name] omeprazole (PRILOSEC) 20 MG capsule Take 20 mg by mouth daily.      . rosuvastatin (CRESTOR) 5 MG tablet Take 5 mg by mouth daily.        . traMADol (ULTRAM) 50 MG tablet Take 50 mg by mouth every 6 (six) hours as needed. For pain      . warfarin (COUMADIN) 5 MG tablet Take 2.5-5 mg by mouth daily. Pt takes on Wednesday and sat       No current facility-administered medications on file prior to visit.    Allergies  Allergen Reactions  . Adhesive [Tape] Other (See Comments)    unknown  . Promethazine Hcl Other (See Comments)     Disorientation   . Tylenol [Acetaminophen] Other (See Comments)    Causes fast heart rate  . Codeine Itching and Rash    Past Medical History  Diagnosis Date  . Arthritis   . Hypertension   . Hyperlipidemia   . AF (atrial fibrillation)   . Asthma   . Diabetes mellitus     TYPE 2  . PNA (pneumonia)     Past Surgical History  Procedure Laterality Date  . Tonsillectomy  1952  . Abdominal hysterectomy  1976  . Cholecystectomy  2003  . Laminectomy  1999    with fusion (L1-5)  . Hand tendon surgery  1999    left wrist - thumb  . Laminectomy  [id number]    with fusion (L3-4)  . Back surgery      lumb x 3  . Carpal tunnel release  09/15/2011    Procedure: CARPAL TUNNEL RELEASE;  Surgeon: Wynonia Sours, MD;  Location: [place];  Service: Orthopedics;  Laterality: Right;  . Cardioversion  12/07/2011    Procedure: CARDIOVERSION;  Surgeon: Peter M Martinique, MD;  Location: [facility] OR;  Service: Cardiovascular;  Laterality: N/A;  . Cataract extraction  History  Smoking status  . Former Smoker -- 0.80 packs/day for 20 years  . Types: Cigarettes  . Quit date: 06/29/1978  Smokeless tobacco  . Never Used    History  Alcohol Use No    Family History  Problem Relation Age of Onset  . Hypertension Mother 84  . Stroke Mother 22  . Arthritis Mother 35  . Heart attack Father 67  . Heart disease Father 75  . Hypertension Father 17  . Ulcers Father 27  . Heart disease Brother     CONGENTIAL HEART DISEASE  . Heart disease Sister   . Diabetes Sister     Review of Systems:  As noted in history of present illness. All other systems were reviewed and are negative.  Physical Exam: BP 120/84  Pulse 93  Ht 5\' 6"  (1.676 m)  Wt 207 lb 12.8 oz (94.257 kg)  BMI 33.56 kg/m2 She is a pleasant elderly white female in no acute distress. She is seen in a wheelchair.  HEENT exam is unremarkable.  Neck reveals marked cervical kyphosis without JVD, adenopathy, thyromegaly, or  bruits. Lungs are clear. Cardiac exam reveals an irregular rate and rhythm without gallop, murmur, or click. Abdomen is obese, soft, nontender. She has no masses. She has trace  edema. Pulses are good. Skin is warm and dry. She is kyphotic.  She is in a wheelchair today.  Cranial nerves II through XII are intact.  LABORATORY DATA: ECG today demonstrates atrial fibrillation with a rate of 93 beats per minute. There is low voltage. There is nonspecific ST-T wave abnormality.  Assessment / Plan: 1. Atrial fibrillation, permanent. Rate is well controlled and she is anticoagulated appropriately with Coumadin. I'll followup again in one year.  2. Hypertension, well controlled.

## 2014-03-27 DIAGNOSIS — I4891 Unspecified atrial fibrillation: Secondary | ICD-10-CM | POA: Diagnosis not present

## 2014-03-27 DIAGNOSIS — Z7901 Long term (current) use of anticoagulants: Secondary | ICD-10-CM | POA: Diagnosis not present

## 2014-04-10 DIAGNOSIS — D485 Neoplasm of uncertain behavior of skin: Secondary | ICD-10-CM | POA: Diagnosis not present

## 2014-04-12 DIAGNOSIS — Z7901 Long term (current) use of anticoagulants: Secondary | ICD-10-CM | POA: Diagnosis not present

## 2014-04-12 DIAGNOSIS — I480 Paroxysmal atrial fibrillation: Secondary | ICD-10-CM | POA: Diagnosis not present

## 2014-04-12 DIAGNOSIS — Z23 Encounter for immunization: Secondary | ICD-10-CM | POA: Diagnosis not present

## 2014-05-02 DIAGNOSIS — Z7901 Long term (current) use of anticoagulants: Secondary | ICD-10-CM | POA: Diagnosis not present

## 2014-05-02 DIAGNOSIS — I10 Essential (primary) hypertension: Secondary | ICD-10-CM | POA: Diagnosis not present

## 2014-05-02 DIAGNOSIS — E118 Type 2 diabetes mellitus with unspecified complications: Secondary | ICD-10-CM | POA: Diagnosis not present

## 2014-05-02 DIAGNOSIS — I509 Heart failure, unspecified: Secondary | ICD-10-CM | POA: Diagnosis not present

## 2014-05-02 DIAGNOSIS — I480 Paroxysmal atrial fibrillation: Secondary | ICD-10-CM | POA: Diagnosis not present

## 2014-05-02 DIAGNOSIS — Z6831 Body mass index (BMI) 31.0-31.9, adult: Secondary | ICD-10-CM | POA: Diagnosis not present

## 2014-05-02 DIAGNOSIS — Z1389 Encounter for screening for other disorder: Secondary | ICD-10-CM | POA: Diagnosis not present

## 2014-05-02 DIAGNOSIS — J45909 Unspecified asthma, uncomplicated: Secondary | ICD-10-CM | POA: Diagnosis not present

## 2014-05-02 DIAGNOSIS — M1990 Unspecified osteoarthritis, unspecified site: Secondary | ICD-10-CM | POA: Diagnosis not present

## 2014-05-23 DIAGNOSIS — Z7901 Long term (current) use of anticoagulants: Secondary | ICD-10-CM | POA: Diagnosis not present

## 2014-05-23 DIAGNOSIS — I480 Paroxysmal atrial fibrillation: Secondary | ICD-10-CM | POA: Diagnosis not present

## 2014-06-20 DIAGNOSIS — Z7901 Long term (current) use of anticoagulants: Secondary | ICD-10-CM | POA: Diagnosis not present

## 2014-06-20 DIAGNOSIS — I480 Paroxysmal atrial fibrillation: Secondary | ICD-10-CM | POA: Diagnosis not present

## 2014-08-02 DIAGNOSIS — I480 Paroxysmal atrial fibrillation: Secondary | ICD-10-CM | POA: Diagnosis not present

## 2014-08-02 DIAGNOSIS — Z7901 Long term (current) use of anticoagulants: Secondary | ICD-10-CM | POA: Diagnosis not present

## 2014-09-06 DIAGNOSIS — Z7901 Long term (current) use of anticoagulants: Secondary | ICD-10-CM | POA: Diagnosis not present

## 2014-09-06 DIAGNOSIS — I480 Paroxysmal atrial fibrillation: Secondary | ICD-10-CM | POA: Diagnosis not present

## 2014-09-25 DIAGNOSIS — M1990 Unspecified osteoarthritis, unspecified site: Secondary | ICD-10-CM | POA: Diagnosis not present

## 2014-10-10 DIAGNOSIS — M1990 Unspecified osteoarthritis, unspecified site: Secondary | ICD-10-CM | POA: Diagnosis not present

## 2014-10-10 DIAGNOSIS — M542 Cervicalgia: Secondary | ICD-10-CM | POA: Diagnosis not present

## 2014-10-16 DIAGNOSIS — I480 Paroxysmal atrial fibrillation: Secondary | ICD-10-CM | POA: Diagnosis not present

## 2014-10-16 DIAGNOSIS — Z7901 Long term (current) use of anticoagulants: Secondary | ICD-10-CM | POA: Diagnosis not present

## 2014-10-24 DIAGNOSIS — M1990 Unspecified osteoarthritis, unspecified site: Secondary | ICD-10-CM | POA: Diagnosis not present

## 2014-10-24 DIAGNOSIS — M542 Cervicalgia: Secondary | ICD-10-CM | POA: Diagnosis not present

## 2014-10-31 DIAGNOSIS — Z6831 Body mass index (BMI) 31.0-31.9, adult: Secondary | ICD-10-CM | POA: Diagnosis not present

## 2014-10-31 DIAGNOSIS — I10 Essential (primary) hypertension: Secondary | ICD-10-CM | POA: Diagnosis not present

## 2014-10-31 DIAGNOSIS — I509 Heart failure, unspecified: Secondary | ICD-10-CM | POA: Diagnosis not present

## 2014-10-31 DIAGNOSIS — M1990 Unspecified osteoarthritis, unspecified site: Secondary | ICD-10-CM | POA: Diagnosis not present

## 2014-10-31 DIAGNOSIS — Z7901 Long term (current) use of anticoagulants: Secondary | ICD-10-CM | POA: Diagnosis not present

## 2014-10-31 DIAGNOSIS — E118 Type 2 diabetes mellitus with unspecified complications: Secondary | ICD-10-CM | POA: Diagnosis not present

## 2014-10-31 DIAGNOSIS — E785 Hyperlipidemia, unspecified: Secondary | ICD-10-CM | POA: Diagnosis not present

## 2014-10-31 DIAGNOSIS — I480 Paroxysmal atrial fibrillation: Secondary | ICD-10-CM | POA: Diagnosis not present

## 2014-10-31 DIAGNOSIS — J45909 Unspecified asthma, uncomplicated: Secondary | ICD-10-CM | POA: Diagnosis not present

## 2014-11-14 DIAGNOSIS — I480 Paroxysmal atrial fibrillation: Secondary | ICD-10-CM | POA: Diagnosis not present

## 2014-11-14 DIAGNOSIS — Z7901 Long term (current) use of anticoagulants: Secondary | ICD-10-CM | POA: Diagnosis not present

## 2014-12-18 DIAGNOSIS — Z7901 Long term (current) use of anticoagulants: Secondary | ICD-10-CM | POA: Diagnosis not present

## 2014-12-18 DIAGNOSIS — I480 Paroxysmal atrial fibrillation: Secondary | ICD-10-CM | POA: Diagnosis not present

## 2014-12-25 DIAGNOSIS — H5203 Hypermetropia, bilateral: Secondary | ICD-10-CM | POA: Diagnosis not present

## 2014-12-25 DIAGNOSIS — Z961 Presence of intraocular lens: Secondary | ICD-10-CM | POA: Diagnosis not present

## 2014-12-25 DIAGNOSIS — Z9849 Cataract extraction status, unspecified eye: Secondary | ICD-10-CM | POA: Diagnosis not present

## 2014-12-25 DIAGNOSIS — H524 Presbyopia: Secondary | ICD-10-CM | POA: Diagnosis not present

## 2014-12-25 DIAGNOSIS — E119 Type 2 diabetes mellitus without complications: Secondary | ICD-10-CM | POA: Diagnosis not present

## 2015-01-22 ENCOUNTER — Other Ambulatory Visit: Payer: Self-pay

## 2015-01-22 DIAGNOSIS — Z7901 Long term (current) use of anticoagulants: Secondary | ICD-10-CM | POA: Diagnosis not present

## 2015-01-22 DIAGNOSIS — Z1231 Encounter for screening mammogram for malignant neoplasm of breast: Secondary | ICD-10-CM

## 2015-01-22 DIAGNOSIS — I480 Paroxysmal atrial fibrillation: Secondary | ICD-10-CM | POA: Diagnosis not present

## 2015-02-21 ENCOUNTER — Ambulatory Visit: Payer: Self-pay

## 2015-02-27 ENCOUNTER — Ambulatory Visit
Admission: RE | Admit: 2015-02-27 | Discharge: 2015-02-27 | Disposition: A | Discharge status: Home / Self Care | Payer: Medicare Other | Source: Ambulatory Visit

## 2015-02-27 DIAGNOSIS — Z1231 Encounter for screening mammogram for malignant neoplasm of breast: Secondary | ICD-10-CM

## 2015-02-28 DIAGNOSIS — Z7901 Long term (current) use of anticoagulants: Secondary | ICD-10-CM | POA: Diagnosis not present

## 2015-02-28 DIAGNOSIS — I480 Paroxysmal atrial fibrillation: Secondary | ICD-10-CM | POA: Diagnosis not present

## 2015-03-07 ENCOUNTER — Encounter: Payer: Self-pay | Admitting: Cardiology

## 2015-03-07 ENCOUNTER — Ambulatory Visit (INDEPENDENT_AMBULATORY_CARE_PROVIDER_SITE_OTHER): Payer: Medicare Other | Admitting: Cardiology

## 2015-03-07 VITALS — BP 116/70 | HR 92 | Ht 66.0 in | Wt 204.0 lb

## 2015-03-07 DIAGNOSIS — I482 Chronic atrial fibrillation, unspecified: Secondary | ICD-10-CM

## 2015-03-07 DIAGNOSIS — E785 Hyperlipidemia, unspecified: Secondary | ICD-10-CM | POA: Diagnosis not present

## 2015-03-07 DIAGNOSIS — I10 Essential (primary) hypertension: Secondary | ICD-10-CM | POA: Diagnosis not present

## 2015-03-07 NOTE — Progress Notes (Signed)
Casey Patel Date of Birth: [birth year]   History of Present Illness: Casey Patel is seen  for followup of chronic atrial fibrillation. She has failed multiple attempts at cardioversion before. She is being managed with rate control and anticoagulation. Since her last visit one year ago she reports no new medical problems.  She reports her Coumadin checks have gone well. She denies any palpitations, dizziness, shortness of breath, or chest pain. She has severe cervical kyphosis and is seen in a wheelchair.  Current Outpatient Prescriptions on File Prior to Visit  Medication Sig Dispense Refill  . albuterol (PROAIR HFA) 108 (90 BASE) MCG/ACT inhaler Inhale 2 puffs into the lungs every 4 (four) hours as needed. For wheezing    . ASMANEX 60 METERED DOSES 220 MCG/INH inhaler     . Cholecalciferol (VITAMIN D) 2000 UNITS CAPS Take 2,000 Units by mouth daily.    Casey Patel Calcium (STOOL SOFTENER PO) Take by mouth as directed.    . furosemide (LASIX) 20 MG tablet Take 20 mg by mouth Daily.    Casey Patel glimepiride (AMARYL) 1 MG tablet     . metFORMIN (GLUCOPHAGE) 500 MG tablet     . methocarbamol (ROBAXIN) 750 MG tablet Take 750 mg by mouth 3 (three) times daily.     . metoprolol (TOPROL-XL) 100 MG 24 hr tablet Take 150 mg by mouth daily.     Casey Patel omeprazole (PRILOSEC) 20 MG capsule Take 20 mg by mouth daily.    . rosuvastatin (CRESTOR) 5 MG tablet Take 5 mg by mouth daily.      . traMADol (ULTRAM) 50 MG tablet Take 50 mg by mouth every 6 (six) hours as needed. For pain    . warfarin (COUMADIN) 5 MG tablet Take 2.5-5 mg by mouth daily. Pt takes on Wednesday and sat     No current facility-administered medications on file prior to visit.    Allergies  Allergen Reactions  . Adhesive [Tape] Other (See Comments)    unknown  . Promethazine Hcl Other (See Comments)    Disorientation   . Tylenol [Acetaminophen] Other (See Comments)    Causes fast heart rate  . Codeine Itching and Rash    Past Medical  History  Diagnosis Date  . Arthritis   . Hypertension   . Hyperlipidemia   . AF (atrial fibrillation)   . Asthma   . Diabetes mellitus     TYPE 2  . PNA (pneumonia)     Past Surgical History  Procedure Laterality Date  . Tonsillectomy  1952  . Abdominal hysterectomy  1976  . Cholecystectomy  2003  . Laminectomy  1999    with fusion (L1-5)  . Hand tendon surgery  1999    left wrist - thumb  . Laminectomy  [id number]    with fusion (L3-4)  . Back surgery      lumb x 3  . Carpal tunnel release  09/15/2011    Procedure: CARPAL TUNNEL RELEASE;  Surgeon: Wynonia Sours, MD;  Location: [place];  Service: Orthopedics;  Laterality: Right;  . Cardioversion  12/07/2011    Procedure: CARDIOVERSION;  Surgeon: Peter M Martinique, MD;  Location: [facility] OR;  Service: Cardiovascular;  Laterality: N/A;  . Cataract extraction      History  Smoking status  . Former Smoker -- 0.80 packs/day for 20 years  . Types: Cigarettes  . Quit date: 06/29/1978  Smokeless tobacco  . Never Used    History  Alcohol Use  No    Family History  Problem Relation Age of Onset  . Hypertension Mother 42  . Stroke Mother 61  . Arthritis Mother 45  . Heart attack Father 51  . Heart disease Father 3  . Hypertension Father 65  . Ulcers Father 26  . Heart disease Brother     CONGENTIAL HEART DISEASE  . Heart disease Sister   . Diabetes Sister     Review of Systems:  As noted in history of present illness. All other systems were reviewed and are negative.  Physical Exam: BP 116/70 mmHg  Pulse 92  Ht '5\' 6"'$  (1.676 m)  Wt 92.534 kg (204 lb)  BMI 32.94 kg/m2 She is a pleasant elderly white female in no acute distress. She is seen in a wheelchair.  HEENT exam is unremarkable.  Neck reveals marked cervical kyphosis without JVD, adenopathy, thyromegaly, or bruits. Lungs are clear. Cardiac exam reveals an irregular rate and rhythm without gallop, murmur, or click. Abdomen is obese, soft, nontender. She  has no masses. She has trace  edema. Pulses are good. Skin is warm and dry. She is kyphotic.  She is in a wheelchair today.  Cranial nerves II through XII are intact.  LABORATORY DATA: ECG today demonstrates atrial fibrillation with a rate of 92 beats per minute. There is low voltage. There is nonspecific ST-T wave abnormality. I have personally reviewed and interpreted this study.   Assessment / Plan: 1. Atrial fibrillation, permanent. Rate is well controlled and she is anticoagulated appropriately with Coumadin. She is asymptomatic. I'll followup again in one year.  2. Hypertension, well controlled.

## 2015-03-07 NOTE — Patient Instructions (Signed)
Continue your current therapy  I will see you in one year   

## 2015-04-03 DIAGNOSIS — I480 Paroxysmal atrial fibrillation: Secondary | ICD-10-CM | POA: Diagnosis not present

## 2015-04-03 DIAGNOSIS — Z23 Encounter for immunization: Secondary | ICD-10-CM | POA: Diagnosis not present

## 2015-04-03 DIAGNOSIS — Z7901 Long term (current) use of anticoagulants: Secondary | ICD-10-CM | POA: Diagnosis not present

## 2015-05-03 DIAGNOSIS — Z7901 Long term (current) use of anticoagulants: Secondary | ICD-10-CM | POA: Diagnosis not present

## 2015-05-03 DIAGNOSIS — I509 Heart failure, unspecified: Secondary | ICD-10-CM | POA: Diagnosis not present

## 2015-05-03 DIAGNOSIS — E118 Type 2 diabetes mellitus with unspecified complications: Secondary | ICD-10-CM | POA: Diagnosis not present

## 2015-05-03 DIAGNOSIS — Z993 Dependence on wheelchair: Secondary | ICD-10-CM | POA: Diagnosis not present

## 2015-05-03 DIAGNOSIS — I10 Essential (primary) hypertension: Secondary | ICD-10-CM | POA: Diagnosis not present

## 2015-05-03 DIAGNOSIS — M5136 Other intervertebral disc degeneration, lumbar region: Secondary | ICD-10-CM | POA: Diagnosis not present

## 2015-05-03 DIAGNOSIS — Z6832 Body mass index (BMI) 32.0-32.9, adult: Secondary | ICD-10-CM | POA: Diagnosis not present

## 2015-05-03 DIAGNOSIS — J4540 Moderate persistent asthma, uncomplicated: Secondary | ICD-10-CM | POA: Diagnosis not present

## 2015-05-03 DIAGNOSIS — R600 Localized edema: Secondary | ICD-10-CM | POA: Diagnosis not present

## 2015-05-03 DIAGNOSIS — I482 Chronic atrial fibrillation: Secondary | ICD-10-CM | POA: Diagnosis not present

## 2015-05-03 DIAGNOSIS — Z1389 Encounter for screening for other disorder: Secondary | ICD-10-CM | POA: Diagnosis not present

## 2015-05-03 DIAGNOSIS — E7800 Pure hypercholesterolemia, unspecified: Secondary | ICD-10-CM | POA: Diagnosis not present

## 2015-05-29 DIAGNOSIS — I482 Chronic atrial fibrillation: Secondary | ICD-10-CM | POA: Diagnosis not present

## 2015-05-29 DIAGNOSIS — Z7901 Long term (current) use of anticoagulants: Secondary | ICD-10-CM | POA: Diagnosis not present

## 2015-07-03 DIAGNOSIS — I482 Chronic atrial fibrillation: Secondary | ICD-10-CM | POA: Diagnosis not present

## 2015-07-03 DIAGNOSIS — Z7901 Long term (current) use of anticoagulants: Secondary | ICD-10-CM | POA: Diagnosis not present

## 2015-08-07 DIAGNOSIS — Z7901 Long term (current) use of anticoagulants: Secondary | ICD-10-CM | POA: Diagnosis not present

## 2015-08-07 DIAGNOSIS — I482 Chronic atrial fibrillation: Secondary | ICD-10-CM | POA: Diagnosis not present

## 2015-09-10 DIAGNOSIS — I482 Chronic atrial fibrillation: Secondary | ICD-10-CM | POA: Diagnosis not present

## 2015-09-10 DIAGNOSIS — Z7901 Long term (current) use of anticoagulants: Secondary | ICD-10-CM | POA: Diagnosis not present

## 2015-09-24 DIAGNOSIS — I482 Chronic atrial fibrillation: Secondary | ICD-10-CM | POA: Diagnosis not present

## 2015-09-24 DIAGNOSIS — Z7901 Long term (current) use of anticoagulants: Secondary | ICD-10-CM | POA: Diagnosis not present

## 2015-10-30 DIAGNOSIS — I482 Chronic atrial fibrillation: Secondary | ICD-10-CM | POA: Diagnosis not present

## 2015-10-30 DIAGNOSIS — M1990 Unspecified osteoarthritis, unspecified site: Secondary | ICD-10-CM | POA: Diagnosis not present

## 2015-10-30 DIAGNOSIS — Z6832 Body mass index (BMI) 32.0-32.9, adult: Secondary | ICD-10-CM | POA: Diagnosis not present

## 2015-10-30 DIAGNOSIS — Z7901 Long term (current) use of anticoagulants: Secondary | ICD-10-CM | POA: Diagnosis not present

## 2015-10-30 DIAGNOSIS — E118 Type 2 diabetes mellitus with unspecified complications: Secondary | ICD-10-CM | POA: Diagnosis not present

## 2015-10-30 DIAGNOSIS — I509 Heart failure, unspecified: Secondary | ICD-10-CM | POA: Diagnosis not present

## 2015-10-30 DIAGNOSIS — Z993 Dependence on wheelchair: Secondary | ICD-10-CM | POA: Diagnosis not present

## 2015-10-30 DIAGNOSIS — Z1389 Encounter for screening for other disorder: Secondary | ICD-10-CM | POA: Diagnosis not present

## 2015-10-30 DIAGNOSIS — I10 Essential (primary) hypertension: Secondary | ICD-10-CM | POA: Diagnosis not present

## 2015-10-30 DIAGNOSIS — E7800 Pure hypercholesterolemia, unspecified: Secondary | ICD-10-CM | POA: Diagnosis not present

## 2015-10-30 DIAGNOSIS — J4540 Moderate persistent asthma, uncomplicated: Secondary | ICD-10-CM | POA: Diagnosis not present

## 2015-10-30 DIAGNOSIS — M5136 Other intervertebral disc degeneration, lumbar region: Secondary | ICD-10-CM | POA: Diagnosis not present

## 2015-12-05 DIAGNOSIS — I482 Chronic atrial fibrillation: Secondary | ICD-10-CM | POA: Diagnosis not present

## 2015-12-05 DIAGNOSIS — Z7901 Long term (current) use of anticoagulants: Secondary | ICD-10-CM | POA: Diagnosis not present

## 2016-01-08 DIAGNOSIS — Z7901 Long term (current) use of anticoagulants: Secondary | ICD-10-CM | POA: Diagnosis not present

## 2016-01-08 DIAGNOSIS — I482 Chronic atrial fibrillation: Secondary | ICD-10-CM | POA: Diagnosis not present

## 2016-01-29 ENCOUNTER — Other Ambulatory Visit: Payer: Self-pay | Admitting: Internal Medicine

## 2016-01-29 DIAGNOSIS — Z1231 Encounter for screening mammogram for malignant neoplasm of breast: Secondary | ICD-10-CM

## 2016-02-13 DIAGNOSIS — Z7901 Long term (current) use of anticoagulants: Secondary | ICD-10-CM | POA: Diagnosis not present

## 2016-02-13 DIAGNOSIS — I482 Chronic atrial fibrillation: Secondary | ICD-10-CM | POA: Diagnosis not present

## 2016-02-20 DIAGNOSIS — E119 Type 2 diabetes mellitus without complications: Secondary | ICD-10-CM | POA: Diagnosis not present

## 2016-02-28 ENCOUNTER — Ambulatory Visit
Admission: RE | Admit: 2016-02-28 | Discharge: 2016-02-28 | Disposition: A | Discharge status: Home / Self Care | Payer: Medicare Other | Source: Ambulatory Visit | Attending: Internal Medicine | Admitting: Internal Medicine

## 2016-02-28 ENCOUNTER — Ambulatory Visit: Payer: Self-pay

## 2016-02-28 DIAGNOSIS — Z1231 Encounter for screening mammogram for malignant neoplasm of breast: Secondary | ICD-10-CM

## 2016-03-25 DIAGNOSIS — Z23 Encounter for immunization: Secondary | ICD-10-CM | POA: Diagnosis not present

## 2016-03-25 DIAGNOSIS — Z7901 Long term (current) use of anticoagulants: Secondary | ICD-10-CM | POA: Diagnosis not present

## 2016-03-25 DIAGNOSIS — I482 Chronic atrial fibrillation: Secondary | ICD-10-CM | POA: Diagnosis not present

## 2016-04-09 DIAGNOSIS — I482 Chronic atrial fibrillation: Secondary | ICD-10-CM | POA: Diagnosis not present

## 2016-04-09 DIAGNOSIS — Z7901 Long term (current) use of anticoagulants: Secondary | ICD-10-CM | POA: Diagnosis not present

## 2016-05-07 DIAGNOSIS — E118 Type 2 diabetes mellitus with unspecified complications: Secondary | ICD-10-CM | POA: Diagnosis not present

## 2016-05-07 DIAGNOSIS — M1990 Unspecified osteoarthritis, unspecified site: Secondary | ICD-10-CM | POA: Diagnosis not present

## 2016-05-07 DIAGNOSIS — I509 Heart failure, unspecified: Secondary | ICD-10-CM | POA: Diagnosis not present

## 2016-05-07 DIAGNOSIS — J4540 Moderate persistent asthma, uncomplicated: Secondary | ICD-10-CM | POA: Diagnosis not present

## 2016-05-07 DIAGNOSIS — I482 Chronic atrial fibrillation: Secondary | ICD-10-CM | POA: Diagnosis not present

## 2016-05-07 DIAGNOSIS — Z993 Dependence on wheelchair: Secondary | ICD-10-CM | POA: Diagnosis not present

## 2016-05-07 DIAGNOSIS — M5136 Other intervertebral disc degeneration, lumbar region: Secondary | ICD-10-CM | POA: Diagnosis not present

## 2016-05-07 DIAGNOSIS — I10 Essential (primary) hypertension: Secondary | ICD-10-CM | POA: Diagnosis not present

## 2016-05-07 DIAGNOSIS — Z7901 Long term (current) use of anticoagulants: Secondary | ICD-10-CM | POA: Diagnosis not present

## 2016-05-07 DIAGNOSIS — E7800 Pure hypercholesterolemia, unspecified: Secondary | ICD-10-CM | POA: Diagnosis not present

## 2016-05-07 DIAGNOSIS — Z6832 Body mass index (BMI) 32.0-32.9, adult: Secondary | ICD-10-CM | POA: Diagnosis not present

## 2016-05-26 ENCOUNTER — Encounter: Payer: Self-pay | Admitting: Cardiology

## 2016-05-31 NOTE — Progress Notes (Signed)
Lekesha Patel Date of Birth: 24-Feb-1930   History of Present Illness: Casey Patel is seen  for followup of chronic atrial fibrillation. She has failed multiple attempts at cardioversion before. She is being managed with rate control and anticoagulation. Since her last visit one year ago she reports no new medical problems.  She reports her Coumadin checks have gone well. She denies any palpitations, dizziness, shortness of breath, or chest pain. She has severe cervical kyphosis and is seen in a wheelchair.  Current Outpatient Prescriptions on File Prior to Visit  Medication Sig Dispense Refill  . albuterol (PROAIR HFA) 108 (90 BASE) MCG/ACT inhaler Inhale 2 puffs into the lungs every 4 (four) hours as needed. For wheezing    . ASMANEX 60 METERED DOSES 220 MCG/INH inhaler     . BREO ELLIPTA 100-25 MCG/INH AEPB Take 1 puff by mouth daily.    . Cholecalciferol (VITAMIN D) 2000 UNITS CAPS Take 2,000 Units by mouth daily.    [name] Calcium (STOOL SOFTENER PO) Take by mouth as directed.    . furosemide (LASIX) 20 MG tablet Take 20 mg by mouth Daily.    [name] glimepiride (AMARYL) 1 MG tablet     . metFORMIN (GLUCOPHAGE) 500 MG tablet     . methocarbamol (ROBAXIN) 750 MG tablet Take 750 mg by mouth 3 (three) times daily.     . metoprolol (TOPROL-XL) 100 MG 24 hr tablet Take 150 mg by mouth daily.     [name] omeprazole (PRILOSEC) 20 MG capsule Take 20 mg by mouth daily.    . rosuvastatin (CRESTOR) 5 MG tablet Take 5 mg by mouth daily.      . traMADol (ULTRAM) 50 MG tablet Take 50 mg by mouth every 6 (six) hours as needed. For pain    . warfarin (COUMADIN) 5 MG tablet Take 2.5-5 mg by mouth daily. Pt takes on Wednesday and sat     No current facility-administered medications on file prior to visit.     Allergies  Allergen Reactions  . Adhesive [Tape] Other (See Comments)    unknown  . Promethazine Hcl Other (See Comments)    Disorientation   . Tylenol [Acetaminophen] Other (See Comments)     Causes fast heart rate  . Codeine Itching and Rash    Past Medical History:  Diagnosis Date  . AF (atrial fibrillation) ([place])   . Arthritis   . Asthma   . Diabetes mellitus    TYPE 2  . Hyperlipidemia   . Hypertension   . PNA (pneumonia)     Past Surgical History:  Procedure Laterality Date  . ABDOMINAL HYSTERECTOMY  1976  . BACK SURGERY     lumb x 3  . CARDIOVERSION  12/07/2011   Procedure: CARDIOVERSION;  Surgeon: Peter M Martinique, MD;  Location: [place];  Service: Cardiovascular;  Laterality: N/A;  . CARPAL TUNNEL RELEASE  09/15/2011   Procedure: CARPAL TUNNEL RELEASE;  Surgeon: Wynonia Sours, MD;  Location: [place];  Service: Orthopedics;  Laterality: Right;  . CATARACT EXTRACTION    . CHOLECYSTECTOMY  2003  . HAND TENDON SURGERY  1999   left wrist - thumb  . LAMINECTOMY  1999   with fusion (L1-5)  . LAMINECTOMY  [id number]   with fusion (L3-4)  . TONSILLECTOMY  1952    History  Smoking Status  . Former Smoker  . Packs/day: 0.80  . Years: 20.00  . Types: Cigarettes  . Quit date: 06/29/1978  Smokeless  Tobacco  . Never Used    History  Alcohol Use No    Family History  Problem Relation Age of Onset  . Hypertension Mother 64  . Stroke Mother 56  . Arthritis Mother 43  . Heart attack Father 37  . Heart disease Father 14  . Hypertension Father 42  . Ulcers Father 53  . Heart disease Brother     CONGENTIAL HEART DISEASE  . Heart disease Sister   . Diabetes Sister     Review of Systems:  As noted in history of present illness. All other systems were reviewed and are negative.  Physical Exam: BP 110/64   Pulse 92   Ht '5\' 6"'$  (1.676 m)   Wt 206 lb (93.4 kg)   BMI 33.25 kg/m  She is a pleasant elderly white female in no acute distress. She is seen in a wheelchair.  HEENT exam is unremarkable.  Neck reveals marked cervical kyphosis without JVD, adenopathy, thyromegaly, or bruits. Lungs are clear. Cardiac exam reveals an irregular rate and rhythm  without gallop, murmur, or click. Abdomen is obese, soft, nontender. She has no masses. She has trace  edema. Pulses are good. Skin is warm and dry. She is kyphotic.  She is in a wheelchair today.  Cranial nerves II through XII are intact.  LABORATORY DATA: Lab Results  Component Value Date   WBC 7.6 12/03/2011   HGB 12.4 12/03/2011   HCT 38.5 12/03/2011   PLT 272.0 12/03/2011   GLUCOSE 78 12/03/2011   NA 139 12/03/2011   K 4.0 12/03/2011   CL 95 (L) 12/03/2011   CREATININE 0.7 12/03/2011   BUN 17 12/03/2011   CO2 36 (H) 12/03/2011   INR 2.3 (H) 12/03/2011   Labs dated 05/07/16: cholesterol 183, triglycerides, 161, HDL 56, LDL 95. A1c 5.6%. CMET normal.  ECG today demonstrates atrial fibrillation with a rate of 92 beats per minute. There is low voltage. There is nonspecific ST-T wave abnormality. I have personally reviewed and interpreted this study.   Assessment / Plan: 1. Atrial fibrillation, permanent. Rate is well controlled and she is anticoagulated appropriately with Coumadin.Ecg is stable. She is asymptomatic. I'll followup again in one year.  2. Hypertension, well controlled.

## 2016-06-02 ENCOUNTER — Encounter: Payer: Self-pay | Admitting: Cardiology

## 2016-06-02 ENCOUNTER — Ambulatory Visit (INDEPENDENT_AMBULATORY_CARE_PROVIDER_SITE_OTHER): Payer: Medicare Other | Admitting: Cardiology

## 2016-06-02 VITALS — BP 110/64 | HR 92 | Ht 66.0 in | Wt 206.0 lb

## 2016-06-02 DIAGNOSIS — I10 Essential (primary) hypertension: Secondary | ICD-10-CM | POA: Diagnosis not present

## 2016-06-02 DIAGNOSIS — I482 Chronic atrial fibrillation, unspecified: Secondary | ICD-10-CM

## 2016-06-02 NOTE — Patient Instructions (Signed)
Continue your current therapy  I will see you in one year   

## 2016-06-04 DIAGNOSIS — L309 Dermatitis, unspecified: Secondary | ICD-10-CM | POA: Diagnosis not present

## 2016-06-04 DIAGNOSIS — L570 Actinic keratosis: Secondary | ICD-10-CM | POA: Diagnosis not present

## 2016-06-04 DIAGNOSIS — L304 Erythema intertrigo: Secondary | ICD-10-CM | POA: Diagnosis not present

## 2016-06-16 DIAGNOSIS — Z7901 Long term (current) use of anticoagulants: Secondary | ICD-10-CM | POA: Diagnosis not present

## 2016-06-16 DIAGNOSIS — I482 Chronic atrial fibrillation: Secondary | ICD-10-CM | POA: Diagnosis not present

## 2016-07-22 DIAGNOSIS — I482 Chronic atrial fibrillation: Secondary | ICD-10-CM | POA: Diagnosis not present

## 2016-07-22 DIAGNOSIS — Z7901 Long term (current) use of anticoagulants: Secondary | ICD-10-CM | POA: Diagnosis not present

## 2016-08-27 DIAGNOSIS — Z7901 Long term (current) use of anticoagulants: Secondary | ICD-10-CM | POA: Diagnosis not present

## 2016-08-27 DIAGNOSIS — I509 Heart failure, unspecified: Secondary | ICD-10-CM | POA: Diagnosis not present

## 2016-08-27 DIAGNOSIS — E7800 Pure hypercholesterolemia, unspecified: Secondary | ICD-10-CM | POA: Diagnosis not present

## 2016-08-27 DIAGNOSIS — Z993 Dependence on wheelchair: Secondary | ICD-10-CM | POA: Diagnosis not present

## 2016-08-27 DIAGNOSIS — M5136 Other intervertebral disc degeneration, lumbar region: Secondary | ICD-10-CM | POA: Diagnosis not present

## 2016-08-27 DIAGNOSIS — J4540 Moderate persistent asthma, uncomplicated: Secondary | ICD-10-CM | POA: Diagnosis not present

## 2016-08-27 DIAGNOSIS — E118 Type 2 diabetes mellitus with unspecified complications: Secondary | ICD-10-CM | POA: Diagnosis not present

## 2016-08-27 DIAGNOSIS — I482 Chronic atrial fibrillation: Secondary | ICD-10-CM | POA: Diagnosis not present

## 2016-08-27 DIAGNOSIS — Z6832 Body mass index (BMI) 32.0-32.9, adult: Secondary | ICD-10-CM | POA: Diagnosis not present

## 2016-08-27 DIAGNOSIS — I10 Essential (primary) hypertension: Secondary | ICD-10-CM | POA: Diagnosis not present

## 2016-09-30 DIAGNOSIS — Z7901 Long term (current) use of anticoagulants: Secondary | ICD-10-CM | POA: Diagnosis not present

## 2016-09-30 DIAGNOSIS — I482 Chronic atrial fibrillation: Secondary | ICD-10-CM | POA: Diagnosis not present

## 2016-10-29 DIAGNOSIS — Z7901 Long term (current) use of anticoagulants: Secondary | ICD-10-CM | POA: Diagnosis not present

## 2016-10-29 DIAGNOSIS — I482 Chronic atrial fibrillation: Secondary | ICD-10-CM | POA: Diagnosis not present

## 2016-11-25 DIAGNOSIS — Z6832 Body mass index (BMI) 32.0-32.9, adult: Secondary | ICD-10-CM | POA: Diagnosis not present

## 2016-11-25 DIAGNOSIS — E118 Type 2 diabetes mellitus with unspecified complications: Secondary | ICD-10-CM | POA: Diagnosis not present

## 2016-11-25 DIAGNOSIS — Z993 Dependence on wheelchair: Secondary | ICD-10-CM | POA: Diagnosis not present

## 2016-11-25 DIAGNOSIS — M1990 Unspecified osteoarthritis, unspecified site: Secondary | ICD-10-CM | POA: Diagnosis not present

## 2016-11-25 DIAGNOSIS — J4540 Moderate persistent asthma, uncomplicated: Secondary | ICD-10-CM | POA: Diagnosis not present

## 2016-11-25 DIAGNOSIS — Z7901 Long term (current) use of anticoagulants: Secondary | ICD-10-CM | POA: Diagnosis not present

## 2016-11-25 DIAGNOSIS — E7800 Pure hypercholesterolemia, unspecified: Secondary | ICD-10-CM | POA: Diagnosis not present

## 2016-11-25 DIAGNOSIS — I10 Essential (primary) hypertension: Secondary | ICD-10-CM | POA: Diagnosis not present

## 2016-11-25 DIAGNOSIS — M5136 Other intervertebral disc degeneration, lumbar region: Secondary | ICD-10-CM | POA: Diagnosis not present

## 2016-11-25 DIAGNOSIS — I509 Heart failure, unspecified: Secondary | ICD-10-CM | POA: Diagnosis not present

## 2016-11-25 DIAGNOSIS — I482 Chronic atrial fibrillation: Secondary | ICD-10-CM | POA: Diagnosis not present

## 2016-12-03 DIAGNOSIS — I482 Chronic atrial fibrillation: Secondary | ICD-10-CM | POA: Diagnosis not present

## 2016-12-03 DIAGNOSIS — Z7901 Long term (current) use of anticoagulants: Secondary | ICD-10-CM | POA: Diagnosis not present

## 2017-01-07 DIAGNOSIS — I482 Chronic atrial fibrillation: Secondary | ICD-10-CM | POA: Diagnosis not present

## 2017-01-07 DIAGNOSIS — Z7901 Long term (current) use of anticoagulants: Secondary | ICD-10-CM | POA: Diagnosis not present

## 2017-02-04 DIAGNOSIS — Z7901 Long term (current) use of anticoagulants: Secondary | ICD-10-CM | POA: Diagnosis not present

## 2017-02-04 DIAGNOSIS — M1990 Unspecified osteoarthritis, unspecified site: Secondary | ICD-10-CM | POA: Diagnosis not present

## 2017-02-04 DIAGNOSIS — I482 Chronic atrial fibrillation: Secondary | ICD-10-CM | POA: Diagnosis not present

## 2017-02-17 ENCOUNTER — Other Ambulatory Visit: Payer: Self-pay | Admitting: Internal Medicine

## 2017-02-17 DIAGNOSIS — Z1231 Encounter for screening mammogram for malignant neoplasm of breast: Secondary | ICD-10-CM

## 2017-02-23 DIAGNOSIS — E119 Type 2 diabetes mellitus without complications: Secondary | ICD-10-CM | POA: Diagnosis not present

## 2017-03-03 ENCOUNTER — Ambulatory Visit
Admission: RE | Admit: 2017-03-03 | Discharge: 2017-03-03 | Disposition: A | Discharge status: Home / Self Care | Payer: Medicare Other | Source: Ambulatory Visit | Attending: Internal Medicine | Admitting: Internal Medicine

## 2017-03-03 DIAGNOSIS — Z1231 Encounter for screening mammogram for malignant neoplasm of breast: Secondary | ICD-10-CM | POA: Diagnosis not present

## 2017-03-17 DIAGNOSIS — I482 Chronic atrial fibrillation: Secondary | ICD-10-CM | POA: Diagnosis not present

## 2017-03-17 DIAGNOSIS — Z23 Encounter for immunization: Secondary | ICD-10-CM | POA: Diagnosis not present

## 2017-03-17 DIAGNOSIS — Z7901 Long term (current) use of anticoagulants: Secondary | ICD-10-CM | POA: Diagnosis not present

## 2017-04-28 DIAGNOSIS — Z7901 Long term (current) use of anticoagulants: Secondary | ICD-10-CM | POA: Diagnosis not present

## 2017-04-28 DIAGNOSIS — I482 Chronic atrial fibrillation: Secondary | ICD-10-CM | POA: Diagnosis not present

## 2017-05-12 DIAGNOSIS — I482 Chronic atrial fibrillation: Secondary | ICD-10-CM | POA: Diagnosis not present

## 2017-05-12 DIAGNOSIS — Z7901 Long term (current) use of anticoagulants: Secondary | ICD-10-CM | POA: Diagnosis not present

## 2017-05-24 DIAGNOSIS — E118 Type 2 diabetes mellitus with unspecified complications: Secondary | ICD-10-CM | POA: Diagnosis not present

## 2017-05-24 DIAGNOSIS — Z6831 Body mass index (BMI) 31.0-31.9, adult: Secondary | ICD-10-CM | POA: Diagnosis not present

## 2017-05-24 DIAGNOSIS — Z7901 Long term (current) use of anticoagulants: Secondary | ICD-10-CM | POA: Diagnosis not present

## 2017-05-24 DIAGNOSIS — M1990 Unspecified osteoarthritis, unspecified site: Secondary | ICD-10-CM | POA: Diagnosis not present

## 2017-05-24 DIAGNOSIS — I10 Essential (primary) hypertension: Secondary | ICD-10-CM | POA: Diagnosis not present

## 2017-05-24 DIAGNOSIS — I509 Heart failure, unspecified: Secondary | ICD-10-CM | POA: Diagnosis not present

## 2017-05-24 DIAGNOSIS — J4540 Moderate persistent asthma, uncomplicated: Secondary | ICD-10-CM | POA: Diagnosis not present

## 2017-05-24 DIAGNOSIS — M5136 Other intervertebral disc degeneration, lumbar region: Secondary | ICD-10-CM | POA: Diagnosis not present

## 2017-05-24 DIAGNOSIS — I482 Chronic atrial fibrillation: Secondary | ICD-10-CM | POA: Diagnosis not present

## 2017-05-24 DIAGNOSIS — E7800 Pure hypercholesterolemia, unspecified: Secondary | ICD-10-CM | POA: Diagnosis not present

## 2017-05-24 DIAGNOSIS — Z993 Dependence on wheelchair: Secondary | ICD-10-CM | POA: Diagnosis not present

## 2017-06-08 ENCOUNTER — Ambulatory Visit: Payer: Medicare Other | Admitting: Cardiology

## 2017-06-17 DIAGNOSIS — Z7901 Long term (current) use of anticoagulants: Secondary | ICD-10-CM | POA: Diagnosis not present

## 2017-06-17 DIAGNOSIS — I482 Chronic atrial fibrillation: Secondary | ICD-10-CM | POA: Diagnosis not present

## 2017-07-21 DIAGNOSIS — Z7901 Long term (current) use of anticoagulants: Secondary | ICD-10-CM | POA: Diagnosis not present

## 2017-07-21 DIAGNOSIS — I482 Chronic atrial fibrillation: Secondary | ICD-10-CM | POA: Diagnosis not present

## 2017-08-03 NOTE — Progress Notes (Deleted)
Casey Patel Date of Birth: Sep 22, 1929   History of Present Illness: Casey Patel is seen  for followup of chronic atrial fibrillation. Casey Patel has failed multiple attempts at cardioversion before. Casey Patel is being managed with rate control and anticoagulation. Since her last visit one year ago Casey Patel reports no new medical problems.  Casey Patel reports her Coumadin checks have gone well. Casey Patel denies any palpitations, dizziness, shortness of breath, or chest pain. Casey Patel has severe cervical kyphosis and is seen in a wheelchair.  Current Outpatient Medications on File Prior to Visit  Medication Sig Dispense Refill  . albuterol (PROAIR HFA) 108 (90 BASE) MCG/ACT inhaler Inhale 2 puffs into the lungs every 4 (four) hours as needed. For wheezing    . ASMANEX 60 METERED DOSES 220 MCG/INH inhaler     . BREO ELLIPTA 100-25 MCG/INH AEPB Take 1 puff by mouth daily.    . Cholecalciferol (VITAMIN D) 2000 UNITS CAPS Take 2,000 Units by mouth daily.    [name] Calcium (STOOL SOFTENER PO) Take by mouth as directed.    . fexofenadine (ALLEGRA) 180 MG tablet Take 180 mg by mouth daily.    . furosemide (LASIX) 20 MG tablet Take 20 mg by mouth Daily.    [name] glimepiride (AMARYL) 1 MG tablet     . metFORMIN (GLUCOPHAGE) 500 MG tablet     . methocarbamol (ROBAXIN) 750 MG tablet Take 750 mg by mouth 3 (three) times daily.     . metoprolol (TOPROL-XL) 100 MG 24 hr tablet Take 150 mg by mouth daily.     [name] omeprazole (PRILOSEC) 20 MG capsule Take 20 mg by mouth daily.    . rosuvastatin (CRESTOR) 5 MG tablet Take 5 mg by mouth daily.      . traMADol (ULTRAM) 50 MG tablet Take 50 mg by mouth every 6 (six) hours as needed. For pain    . warfarin (COUMADIN) 5 MG tablet Take 2.5-5 mg by mouth daily. Pt takes on Wednesday and sat     No current facility-administered medications on file prior to visit.     Allergies  Allergen Reactions  . Adhesive [Tape] Other (See Comments)    unknown  . Promethazine Hcl Other (See Comments)   Disorientation   . Tylenol [Acetaminophen] Other (See Comments)    Causes fast heart rate  . Codeine Itching and Rash    Past Medical History:  Diagnosis Date  . AF (atrial fibrillation) ([place])   . Arthritis   . Asthma   . Diabetes mellitus    TYPE 2  . Hyperlipidemia   . Hypertension   . PNA (pneumonia)     Past Surgical History:  Procedure Laterality Date  . ABDOMINAL HYSTERECTOMY  1976  . BACK SURGERY     lumb x 3  . CARDIOVERSION  12/07/2011   Procedure: CARDIOVERSION;  Surgeon: Jasiyah Paulding M Martinique, MD;  Location: [place];  Service: Cardiovascular;  Laterality: N/A;  . CARPAL TUNNEL RELEASE  09/15/2011   Procedure: CARPAL TUNNEL RELEASE;  Surgeon: Wynonia Sours, MD;  Location: [place];  Service: Orthopedics;  Laterality: Right;  . CATARACT EXTRACTION    . CHOLECYSTECTOMY  2003  . HAND TENDON SURGERY  1999   left wrist - thumb  . LAMINECTOMY  1999   with fusion (L1-5)  . LAMINECTOMY  [id number]   with fusion (L3-4)  . TONSILLECTOMY  1952    Social History   Tobacco Use  Smoking Status Former Smoker  . Packs/day:  0.80  . Years: 20.00  . Pack years: 16.00  . Types: Cigarettes  . Last attempt to quit: 06/29/1978  . Years since quitting: 39.1  Smokeless Tobacco Never Used    Social History   Substance and Sexual Activity  Alcohol Use No    Family History  Problem Relation Age of Onset  . Hypertension Mother 70  . Stroke Mother 45  . Arthritis Mother 66  . Heart attack Father 59  . Heart disease Father 83  . Hypertension Father 57  . Ulcers Father 27  . Breast cancer Sister   . Heart disease Brother        CONGENTIAL HEART DISEASE  . Heart disease Sister   . Diabetes Sister   . Breast cancer Sister     Review of Systems:  As noted in history of present illness. All other systems were reviewed and are negative.  Physical Exam: There were no vitals taken for this visit. Casey Patel is a pleasant elderly white female in no acute distress. Casey Patel is seen  in a wheelchair.  HEENT exam is unremarkable.  Neck reveals marked cervical kyphosis without JVD, adenopathy, thyromegaly, or bruits. Lungs are clear. Cardiac exam reveals an irregular rate and rhythm without gallop, murmur, or click. Abdomen is obese, soft, nontender. Casey Patel has no masses. Casey Patel has trace  edema. Pulses are good. Skin is warm and dry. Casey Patel is kyphotic.  Casey Patel is in a wheelchair today.  Cranial nerves II through XII are intact.  LABORATORY DATA: Lab Results  Component Value Date   WBC 7.6 12/03/2011   HGB 12.4 12/03/2011   HCT 38.5 12/03/2011   PLT 272.0 12/03/2011   GLUCOSE 78 12/03/2011   NA 139 12/03/2011   K 4.0 12/03/2011   CL 95 (L) 12/03/2011   CREATININE 0.7 12/03/2011   BUN 17 12/03/2011   CO2 36 (H) 12/03/2011   INR 2.3 (H) 12/03/2011   Labs dated 05/07/16: cholesterol 183, triglycerides, 161, HDL 56, LDL 95. A1c 5.6%. CMET normal.  ECG today demonstrates atrial fibrillation with a rate of 92 beats per minute. There is low voltage. There is nonspecific ST-T wave abnormality. I have personally reviewed and interpreted this study.   Assessment / Plan: 1. Atrial fibrillation, permanent. Rate is well controlled and Casey Patel is anticoagulated appropriately with Coumadin.Ecg is stable. Casey Patel is asymptomatic. I'll followup again in one year.  2. Hypertension, well controlled.

## 2017-08-05 ENCOUNTER — Ambulatory Visit: Payer: Medicare Other | Admitting: Cardiology

## 2017-08-20 NOTE — Progress Notes (Signed)
Casey Patel Date of Birth: 1930/05/16   History of Present Illness: Casey Patel is seen  for followup of chronic atrial fibrillation. She has failed multiple attempts at cardioversion before. She is being managed with rate control and anticoagulation. On Toprol and Coumadin.   On follow up today she is doing well. Denies any new medical problems. No chest pain, palpitations, dyspnea or increased edema.  No bleeding on coumadin. She has severe cervical kyphosis and is seen in a wheelchair.  Current Outpatient Medications on File Prior to Visit  Medication Sig Dispense Refill  . BREO ELLIPTA 100-25 MCG/INH AEPB Take 1 puff by mouth daily.    . Cholecalciferol (VITAMIN D) 2000 UNITS CAPS Take 2,000 Units by mouth daily.    [name] Calcium (STOOL SOFTENER PO) Take by mouth as directed.    . fexofenadine (ALLEGRA) 180 MG tablet Take 180 mg by mouth daily.    . furosemide (LASIX) 20 MG tablet Take 20 mg by mouth Daily.    [name] glimepiride (AMARYL) 1 MG tablet     . metFORMIN (GLUCOPHAGE) 500 MG tablet Take 500 mg by mouth 2 (two) times daily with a meal.     . methocarbamol (ROBAXIN) 750 MG tablet Take 750 mg by mouth 3 (three) times daily.     . metoprolol (TOPROL-XL) 100 MG 24 hr tablet Take 150 mg by mouth daily.     [name] omeprazole (PRILOSEC) 20 MG capsule Take 20 mg by mouth daily.    . traMADol (ULTRAM) 50 MG tablet Take 50 mg by mouth every 6 (six) hours as needed. For pain    . warfarin (COUMADIN) 5 MG tablet Take 2.5-5 mg by mouth daily. Pt takes on Wednesday and sat     No current facility-administered medications on file prior to visit.     Allergies  Allergen Reactions  . Adhesive [Tape] Other (See Comments)    unknown  . Promethazine Hcl Other (See Comments)    Disorientation   . Tylenol [Acetaminophen] Other (See Comments)    Causes fast heart rate  . Codeine Itching and Rash    Past Medical History:  Diagnosis Date  . AF (atrial fibrillation) ([place])   . Arthritis    . Asthma   . Diabetes mellitus    TYPE 2  . Hyperlipidemia   . Hypertension   . PNA (pneumonia)     Past Surgical History:  Procedure Laterality Date  . ABDOMINAL HYSTERECTOMY  1976  . BACK SURGERY     lumb x 3  . CARDIOVERSION  12/07/2011   Procedure: CARDIOVERSION;  Surgeon: Brenya Taulbee M Martinique, MD;  Location: [facility];  Service: Cardiovascular;  Laterality: N/A;  . CARPAL TUNNEL RELEASE  09/15/2011   Procedure: CARPAL TUNNEL RELEASE;  Surgeon: Wynonia Sours, MD;  Location: [facility];  Service: Orthopedics;  Laterality: Right;  . CATARACT EXTRACTION    . CHOLECYSTECTOMY  2003  . HAND TENDON SURGERY  1999   left wrist - thumb  . LAMINECTOMY  1999   with fusion (L1-5)  . LAMINECTOMY  [id number]   with fusion (L3-4)  . TONSILLECTOMY  1952    Social History   Tobacco Use  Smoking Status Former Smoker  . Packs/day: 0.80  . Years: 20.00  . Pack years: 16.00  . Types: Cigarettes  . Last attempt to quit: 06/29/1978  . Years since quitting: 39.1  Smokeless Tobacco Never Used    Social History   Substance and Sexual  Activity  Alcohol Use No    Family History  Problem Relation Age of Onset  . Hypertension Mother 86  . Stroke Mother 46  . Arthritis Mother 69  . Heart attack Father 69  . Heart disease Father 63  . Hypertension Father 50  . Ulcers Father 30  . Breast cancer Sister   . Heart disease Brother        CONGENTIAL HEART DISEASE  . Heart disease Sister   . Diabetes Sister   . Breast cancer Sister     Review of Systems:  As noted in history of present illness. All other systems were reviewed and are negative.  Physical Exam: BP (!) 100/56   Pulse 85   Ht 5\' 6"  (1.676 m)   Wt 208 lb (94.3 kg)   BMI 33.57 kg/m  GENERAL:  Well appearing, obese WF in wheelchair. HEENT:  PERRL, EOMI, sclera are clear. Oropharynx is clear. NECK:  No jugular venous distention, carotid upstroke brisk and symmetric, no bruits, no thyromegaly or adenopathy LUNGS:  Clear  to auscultation bilaterally CHEST:  Unremarkable, marked cervical kyphosis HEART:  RRR,  PMI not displaced or sustained,S1 and S2 within normal limits, no S3, no S4: no clicks, no rubs, no murmurs ABD:  Soft, nontender. BS +, no masses or bruits. No hepatomegaly, no splenomegaly EXT:  2 + pulses throughout, no edema, no cyanosis no clubbing SKIN:  Warm and dry.  No rashes NEURO:  Alert and oriented x 3. Cranial nerves II through XII intact. PSYCH:  Cognitively intact    LABORATORY DATA: Lab Results  Component Value Date   WBC 7.6 12/03/2011   HGB 12.4 12/03/2011   HCT 38.5 12/03/2011   PLT 272.0 12/03/2011   GLUCOSE 78 12/03/2011   NA 139 12/03/2011   K 4.0 12/03/2011   CL 95 (L) 12/03/2011   CREATININE 0.7 12/03/2011   BUN 17 12/03/2011   CO2 36 (H) 12/03/2011   INR 2.3 (H) 12/03/2011   Labs dated 05/07/16: cholesterol 183, triglycerides, 161, HDL 56, LDL 95. A1c 5.6%. CMET normal. Dated 05/24/17: cholesterol 171, triglycerides 101, HDL 58, LDL 93. A1c 6%. Hbg 10.2. Chemistries normal.  ECG today demonstrates atrial fibrillation with a rate of 85 beats per minute. There is low voltage. There is nonspecific ST-T wave abnormality. I have personally reviewed and interpreted this study.   Assessment / Plan: 1. Atrial fibrillation, permanent. Rate is well controlled and she is anticoagulated appropriately with Coumadin. Ecg is unchanged. Rate controlled on Toprol.  I'll followup again in one year.

## 2017-08-25 ENCOUNTER — Encounter: Payer: Self-pay | Admitting: Cardiology

## 2017-08-25 ENCOUNTER — Ambulatory Visit (INDEPENDENT_AMBULATORY_CARE_PROVIDER_SITE_OTHER): Payer: Medicare Other | Admitting: Cardiology

## 2017-08-25 VITALS — BP 100/56 | HR 85 | Ht 66.0 in | Wt 208.0 lb

## 2017-08-25 DIAGNOSIS — I482 Chronic atrial fibrillation, unspecified: Secondary | ICD-10-CM

## 2017-08-25 NOTE — Patient Instructions (Signed)
Continue your current therapy  I will see you in one year   

## 2017-08-31 DIAGNOSIS — Z7901 Long term (current) use of anticoagulants: Secondary | ICD-10-CM | POA: Diagnosis not present

## 2017-08-31 DIAGNOSIS — I482 Chronic atrial fibrillation: Secondary | ICD-10-CM | POA: Diagnosis not present

## 2017-10-06 DIAGNOSIS — I482 Chronic atrial fibrillation: Secondary | ICD-10-CM | POA: Diagnosis not present

## 2017-10-06 DIAGNOSIS — Z7901 Long term (current) use of anticoagulants: Secondary | ICD-10-CM | POA: Diagnosis not present

## 2017-11-15 DIAGNOSIS — L570 Actinic keratosis: Secondary | ICD-10-CM | POA: Diagnosis not present

## 2017-11-17 DIAGNOSIS — I482 Chronic atrial fibrillation: Secondary | ICD-10-CM | POA: Diagnosis not present

## 2017-11-17 DIAGNOSIS — E7800 Pure hypercholesterolemia, unspecified: Secondary | ICD-10-CM | POA: Diagnosis not present

## 2017-11-17 DIAGNOSIS — E118 Type 2 diabetes mellitus with unspecified complications: Secondary | ICD-10-CM | POA: Diagnosis not present

## 2017-11-17 DIAGNOSIS — Z7901 Long term (current) use of anticoagulants: Secondary | ICD-10-CM | POA: Diagnosis not present

## 2017-11-17 DIAGNOSIS — Z993 Dependence on wheelchair: Secondary | ICD-10-CM | POA: Diagnosis not present

## 2017-11-17 DIAGNOSIS — M5136 Other intervertebral disc degeneration, lumbar region: Secondary | ICD-10-CM | POA: Diagnosis not present

## 2017-11-17 DIAGNOSIS — Z6832 Body mass index (BMI) 32.0-32.9, adult: Secondary | ICD-10-CM | POA: Diagnosis not present

## 2017-11-17 DIAGNOSIS — I509 Heart failure, unspecified: Secondary | ICD-10-CM | POA: Diagnosis not present

## 2017-11-17 DIAGNOSIS — I10 Essential (primary) hypertension: Secondary | ICD-10-CM | POA: Diagnosis not present

## 2017-11-17 DIAGNOSIS — J4540 Moderate persistent asthma, uncomplicated: Secondary | ICD-10-CM | POA: Diagnosis not present

## 2017-11-17 DIAGNOSIS — R600 Localized edema: Secondary | ICD-10-CM | POA: Diagnosis not present

## 2017-11-18 DIAGNOSIS — L570 Actinic keratosis: Secondary | ICD-10-CM | POA: Diagnosis not present

## 2017-11-18 DIAGNOSIS — Z7901 Long term (current) use of anticoagulants: Secondary | ICD-10-CM | POA: Diagnosis not present

## 2017-11-23 DIAGNOSIS — L570 Actinic keratosis: Secondary | ICD-10-CM | POA: Diagnosis not present

## 2017-12-16 DIAGNOSIS — Z7901 Long term (current) use of anticoagulants: Secondary | ICD-10-CM | POA: Diagnosis not present

## 2017-12-16 DIAGNOSIS — I482 Chronic atrial fibrillation: Secondary | ICD-10-CM | POA: Diagnosis not present

## 2018-01-04 DIAGNOSIS — L570 Actinic keratosis: Secondary | ICD-10-CM | POA: Diagnosis not present

## 2018-01-20 DIAGNOSIS — I482 Chronic atrial fibrillation: Secondary | ICD-10-CM | POA: Diagnosis not present

## 2018-01-20 DIAGNOSIS — Z7901 Long term (current) use of anticoagulants: Secondary | ICD-10-CM | POA: Diagnosis not present

## 2018-02-11 ENCOUNTER — Other Ambulatory Visit: Payer: Self-pay | Admitting: Internal Medicine

## 2018-02-11 DIAGNOSIS — Z1231 Encounter for screening mammogram for malignant neoplasm of breast: Secondary | ICD-10-CM

## 2018-02-22 DIAGNOSIS — I482 Chronic atrial fibrillation: Secondary | ICD-10-CM | POA: Diagnosis not present

## 2018-02-22 DIAGNOSIS — Z7901 Long term (current) use of anticoagulants: Secondary | ICD-10-CM | POA: Diagnosis not present

## 2018-02-26 ENCOUNTER — Encounter (HOSPITAL_COMMUNITY): Payer: Self-pay | Admitting: Emergency Medicine

## 2018-02-26 ENCOUNTER — Emergency Department (HOSPITAL_COMMUNITY): Payer: Medicare Other

## 2018-02-26 ENCOUNTER — Emergency Department (HOSPITAL_COMMUNITY)
Admission: EM | Admit: 2018-02-26 | Discharge: 2018-02-26 | Disposition: A | Discharge status: Home / Self Care | Payer: Medicare Other | Attending: Emergency Medicine | Admitting: Emergency Medicine

## 2018-02-26 ENCOUNTER — Other Ambulatory Visit: Payer: Self-pay

## 2018-02-26 DIAGNOSIS — J45909 Unspecified asthma, uncomplicated: Secondary | ICD-10-CM | POA: Insufficient documentation

## 2018-02-26 DIAGNOSIS — Y999 Unspecified external cause status: Secondary | ICD-10-CM | POA: Diagnosis not present

## 2018-02-26 DIAGNOSIS — Z79899 Other long term (current) drug therapy: Secondary | ICD-10-CM | POA: Diagnosis not present

## 2018-02-26 DIAGNOSIS — I4891 Unspecified atrial fibrillation: Secondary | ICD-10-CM | POA: Diagnosis not present

## 2018-02-26 DIAGNOSIS — Y939 Activity, unspecified: Secondary | ICD-10-CM | POA: Diagnosis not present

## 2018-02-26 DIAGNOSIS — I10 Essential (primary) hypertension: Secondary | ICD-10-CM | POA: Diagnosis not present

## 2018-02-26 DIAGNOSIS — M25062 Hemarthrosis, left knee: Secondary | ICD-10-CM | POA: Diagnosis not present

## 2018-02-26 DIAGNOSIS — Z87891 Personal history of nicotine dependence: Secondary | ICD-10-CM | POA: Insufficient documentation

## 2018-02-26 DIAGNOSIS — Z7901 Long term (current) use of anticoagulants: Secondary | ICD-10-CM | POA: Insufficient documentation

## 2018-02-26 DIAGNOSIS — M25562 Pain in left knee: Secondary | ICD-10-CM | POA: Insufficient documentation

## 2018-02-26 DIAGNOSIS — Z7984 Long term (current) use of oral hypoglycemic drugs: Secondary | ICD-10-CM | POA: Diagnosis not present

## 2018-02-26 DIAGNOSIS — W19XXXA Unspecified fall, initial encounter: Secondary | ICD-10-CM | POA: Insufficient documentation

## 2018-02-26 DIAGNOSIS — Y929 Unspecified place or not applicable: Secondary | ICD-10-CM | POA: Insufficient documentation

## 2018-02-26 DIAGNOSIS — E119 Type 2 diabetes mellitus without complications: Secondary | ICD-10-CM | POA: Diagnosis not present

## 2018-02-26 DIAGNOSIS — M2500 Hemarthrosis, unspecified joint: Secondary | ICD-10-CM

## 2018-02-26 DIAGNOSIS — S80919A Unspecified superficial injury of unspecified knee, initial encounter: Secondary | ICD-10-CM | POA: Diagnosis not present

## 2018-02-26 DIAGNOSIS — M25462 Effusion, left knee: Secondary | ICD-10-CM | POA: Diagnosis not present

## 2018-02-26 NOTE — Discharge Instructions (Addendum)
Keep your leg elevated, ice the knee, and return here for any problems

## 2018-02-26 NOTE — ED Notes (Signed)
Pt was able to use ED walker to transfer to the w/c and to transfer from w/c to [facility] with assistance. Family states pt will have w/c and walker at home for use.

## 2018-02-26 NOTE — ED Triage Notes (Addendum)
Pt c/o L leg pain behind knee and swelling radiating to calf. Pt tripped yesterday over cat while transferring from walker to recliner. Pt fell on to sacrum, denies injury to back or head. Pt alert and oriented.

## 2018-02-26 NOTE — ED Provider Notes (Signed)
[facility] DEPT Provider Note   CSN: [id number] Arrival date & time: 02/26/18  9833     History   Chief Complaint Chief Complaint  Patient presents with  . Fall  . Leg Pain    HPI Casey Patel is a 84 y.o. female.  84 year old female presents with left knee pain that began after she fell yesterday.  States that she had a mechanical fall and not have any head neck back or chest discomfort.  Patient complains of pain to her left knee characterizes dull and worse with standing.  Denies any associated left hip pain.  No left ankle or foot discomfort at this time.  Pain better with remaining still no treatment used prior to arrival     Past Medical History:  Diagnosis Date  . AF (atrial fibrillation) ([place])   . Arthritis   . Asthma   . Diabetes mellitus    TYPE 2  . Hyperlipidemia   . Hypertension   . PNA (pneumonia)     Patient Active Problem List   Diagnosis Date Noted  . Cough 01/01/2012  . Pneumonia 12/03/2011  . Arthritis   . Hypertension   . Hyperlipidemia   . AF (atrial fibrillation) ([place])   . Asthma   . Diabetes mellitus     Past Surgical History:  Procedure Laterality Date  . ABDOMINAL HYSTERECTOMY  1976  . BACK SURGERY     lumb x 3  . CARDIOVERSION  12/07/2011   Procedure: CARDIOVERSION;  Surgeon: Peter M Martinique, MD;  Location: [facility];  Service: Cardiovascular;  Laterality: N/A;  . CARPAL TUNNEL RELEASE  09/15/2011   Procedure: CARPAL TUNNEL RELEASE;  Surgeon: Wynonia Sours, MD;  Location: [facility];  Service: Orthopedics;  Laterality: Right;  . CATARACT EXTRACTION    . CHOLECYSTECTOMY  2003  . HAND TENDON SURGERY  1999   left wrist - thumb  . LAMINECTOMY  1999   with fusion (L1-5)  . LAMINECTOMY  [id number]   with fusion (L3-4)  . TONSILLECTOMY  1952     OB History   None      Home Medications    Prior to Admission medications   Medication Sig Start Date End Date Taking? Authorizing Provider    BREO ELLIPTA 100-25 MCG/INH AEPB Take 1 puff by mouth daily. 01/12/15   [provider]  Cholecalciferol (VITAMIN D) 2000 UNITS CAPS Take 2,000 Units by mouth daily.    [provider]  Docusate Calcium (STOOL SOFTENER PO) Take by mouth as directed.    [provider]  fexofenadine (ALLEGRA) 180 MG tablet Take 180 mg by mouth daily.    [provider]  furosemide (LASIX) 20 MG tablet Take 20 mg by mouth Daily. 12/09/11   [provider]  glimepiride (AMARYL) 1 MG tablet  02/06/14   [provider]  metFORMIN (GLUCOPHAGE) 500 MG tablet Take 500 mg by mouth 2 (two) times daily with a meal.  02/06/14   [provider]  methocarbamol (ROBAXIN) 750 MG tablet Take 750 mg by mouth 3 (three) times daily.     [provider]  metoprolol (TOPROL-XL) 100 MG 24 hr tablet Take 150 mg by mouth daily.     [provider]  omeprazole (PRILOSEC) 20 MG capsule Take 20 mg by mouth daily.    [provider]  traMADol (ULTRAM) 50 MG tablet Take 50 mg by mouth every 6 (six) hours as needed. For pain  [provider]  warfarin (COUMADIN) 5 MG tablet Take 2.5-5 mg by mouth daily. Pt takes on Wednesday and sat    [provider]    Family History Family History  Problem Relation Age of Onset  . Hypertension Mother 84  . Stroke Mother 28  . Arthritis Mother 3  . Heart attack Father 64  . Heart disease Father 11  . Hypertension Father 30  . Ulcers Father 16  . Breast cancer Sister   . Heart disease Brother        CONGENTIAL HEART DISEASE  . Heart disease Sister   . Diabetes Sister   . Breast cancer Sister     Social History Social History   Tobacco Use  . Smoking status: Former Smoker    Packs/day: 0.80    Years: 20.00    Pack years: 16.00    Types: Cigarettes    Last attempt to quit: 06/29/1978    Years since quitting: 39.6  . Smokeless tobacco: Never Used  Substance Use Topics  . Alcohol  use: No  . Drug use: No     Allergies   Adhesive [tape]; Promethazine hcl; Tylenol [acetaminophen]; and Codeine   Review of Systems Review of Systems  All other systems reviewed and are negative.    Physical Exam Updated Vital Signs BP (!) 127/104 (BP Location: Right Arm)   Pulse 96   Temp (!) 97.5 F (36.4 C) (Oral)   Resp 17   SpO2 95%   Physical Exam  Constitutional: She is oriented to person, place, and time. She appears well-developed and well-nourished.  Non-toxic appearance. No distress.  HENT:  Head: Normocephalic and atraumatic.  Eyes: Pupils are equal, round, and reactive to light. Conjunctivae, EOM and lids are normal.  Neck: Normal range of motion. Neck supple. No tracheal deviation present. No thyroid mass present.  Cardiovascular: Normal rate, regular rhythm and normal heart sounds. Exam reveals no gallop.  No murmur heard. Pulmonary/Chest: Effort normal and breath sounds normal. No stridor. No respiratory distress. She has no decreased breath sounds. She has no wheezes. She has no rhonchi. She has no rales.  Abdominal: Soft. Normal appearance and bowel sounds are normal. She exhibits no distension. There is no tenderness. There is no rebound and no CVA tenderness.  Musculoskeletal: She exhibits no edema or tenderness.       Left knee: She exhibits decreased range of motion and effusion.       Legs: Neurological: She is alert and oriented to person, place, and time. She has normal strength. No cranial nerve deficit or sensory deficit. GCS eye subscore is 4. GCS verbal subscore is 5. GCS motor subscore is 6.  Skin: Skin is warm and dry. No abrasion and no rash noted.  Psychiatric: She has a normal mood and affect. Her speech is normal and behavior is normal.  Nursing note and vitals reviewed.    ED Treatments / Results  Labs (all labs ordered are listed, but only abnormal results are displayed) Labs Reviewed - No data to  display  EKG None  Radiology No results found.  Procedures Procedures (including critical care time)  Medications Ordered in ED Medications - No data to display   Initial Impression / Assessment and Plan / ED Course  I have reviewed the triage vital signs and the nursing notes.  Pertinent labs & imaging results that were available during my care of the patient were reviewed by me and considered in my medical decision making (  see chart for details).    Patient had a INR recently that was 3.7 and that was done 2 days ago and her doctor adjusted her Coumadin dose.  X-ray was negative for fracture but does have an effusion likely she has hemarthrosis.  Patient instructed to elevate her leg ice it and return precautions given  Final Clinical Impressions(s) / ED Diagnoses   Final diagnoses:  None    ED Discharge Orders    None       Lacretia Leigh, MD 02/26/18 1136

## 2018-02-26 NOTE — ED Notes (Signed)
Bed: WA03 Expected date: 02/26/18 Expected time: 9:40 AM Means of arrival: Ambulance Comments: Fall yesterday knee pain

## 2018-03-07 ENCOUNTER — Emergency Department (HOSPITAL_COMMUNITY): Payer: Medicare Other

## 2018-03-07 ENCOUNTER — Inpatient Hospital Stay (HOSPITAL_COMMUNITY)
Admission: EM | Admit: 2018-03-07 | Discharge: 2018-03-11 | DRG: 690 | Disposition: A | Discharge status: Skilled Nursing Facility | Payer: Medicare Other | Attending: Family Medicine | Admitting: Family Medicine

## 2018-03-07 ENCOUNTER — Inpatient Hospital Stay (HOSPITAL_COMMUNITY): Payer: Medicare Other

## 2018-03-07 ENCOUNTER — Encounter (HOSPITAL_COMMUNITY): Payer: Self-pay | Admitting: *Deleted

## 2018-03-07 ENCOUNTER — Other Ambulatory Visit: Payer: Self-pay

## 2018-03-07 DIAGNOSIS — R296 Repeated falls: Secondary | ICD-10-CM | POA: Diagnosis not present

## 2018-03-07 DIAGNOSIS — M2550 Pain in unspecified joint: Secondary | ICD-10-CM | POA: Diagnosis not present

## 2018-03-07 DIAGNOSIS — Z8261 Family history of arthritis: Secondary | ICD-10-CM | POA: Diagnosis not present

## 2018-03-07 DIAGNOSIS — Z833 Family history of diabetes mellitus: Secondary | ICD-10-CM | POA: Diagnosis not present

## 2018-03-07 DIAGNOSIS — Z6833 Body mass index (BMI) 33.0-33.9, adult: Secondary | ICD-10-CM | POA: Diagnosis not present

## 2018-03-07 DIAGNOSIS — R000 Tachycardia, unspecified: Secondary | ICD-10-CM | POA: Diagnosis present

## 2018-03-07 DIAGNOSIS — M19041 Primary osteoarthritis, right hand: Secondary | ICD-10-CM | POA: Diagnosis not present

## 2018-03-07 DIAGNOSIS — Z66 Do not resuscitate: Secondary | ICD-10-CM | POA: Diagnosis present

## 2018-03-07 DIAGNOSIS — R0902 Hypoxemia: Secondary | ICD-10-CM | POA: Diagnosis not present

## 2018-03-07 DIAGNOSIS — L89322 Pressure ulcer of left buttock, stage 2: Secondary | ICD-10-CM | POA: Diagnosis not present

## 2018-03-07 DIAGNOSIS — Y92009 Unspecified place in unspecified non-institutional (private) residence as the place of occurrence of the external cause: Secondary | ICD-10-CM | POA: Diagnosis not present

## 2018-03-07 DIAGNOSIS — R531 Weakness: Secondary | ICD-10-CM | POA: Diagnosis not present

## 2018-03-07 DIAGNOSIS — E0800 Diabetes mellitus due to underlying condition with hyperosmolarity without nonketotic hyperglycemic-hyperosmolar coma (NKHHC): Secondary | ICD-10-CM

## 2018-03-07 DIAGNOSIS — Z7901 Long term (current) use of anticoagulants: Secondary | ICD-10-CM

## 2018-03-07 DIAGNOSIS — E669 Obesity, unspecified: Secondary | ICD-10-CM | POA: Diagnosis present

## 2018-03-07 DIAGNOSIS — N390 Urinary tract infection, site not specified: Secondary | ICD-10-CM | POA: Diagnosis not present

## 2018-03-07 DIAGNOSIS — Z981 Arthrodesis status: Secondary | ICD-10-CM

## 2018-03-07 DIAGNOSIS — J45909 Unspecified asthma, uncomplicated: Secondary | ICD-10-CM | POA: Diagnosis present

## 2018-03-07 DIAGNOSIS — K5900 Constipation, unspecified: Secondary | ICD-10-CM | POA: Diagnosis not present

## 2018-03-07 DIAGNOSIS — Z8701 Personal history of pneumonia (recurrent): Secondary | ICD-10-CM | POA: Diagnosis not present

## 2018-03-07 DIAGNOSIS — E119 Type 2 diabetes mellitus without complications: Secondary | ICD-10-CM | POA: Diagnosis present

## 2018-03-07 DIAGNOSIS — I4510 Unspecified right bundle-branch block: Secondary | ICD-10-CM | POA: Diagnosis present

## 2018-03-07 DIAGNOSIS — K219 Gastro-esophageal reflux disease without esophagitis: Secondary | ICD-10-CM | POA: Diagnosis not present

## 2018-03-07 DIAGNOSIS — E860 Dehydration: Secondary | ICD-10-CM | POA: Diagnosis present

## 2018-03-07 DIAGNOSIS — I10 Essential (primary) hypertension: Secondary | ICD-10-CM | POA: Diagnosis present

## 2018-03-07 DIAGNOSIS — Z9181 History of falling: Secondary | ICD-10-CM | POA: Diagnosis not present

## 2018-03-07 DIAGNOSIS — Z79899 Other long term (current) drug therapy: Secondary | ICD-10-CM

## 2018-03-07 DIAGNOSIS — Z7984 Long term (current) use of oral hypoglycemic drugs: Secondary | ICD-10-CM

## 2018-03-07 DIAGNOSIS — Z9049 Acquired absence of other specified parts of digestive tract: Secondary | ICD-10-CM | POA: Diagnosis not present

## 2018-03-07 DIAGNOSIS — R05 Cough: Secondary | ICD-10-CM | POA: Diagnosis not present

## 2018-03-07 DIAGNOSIS — I4891 Unspecified atrial fibrillation: Secondary | ICD-10-CM | POA: Diagnosis not present

## 2018-03-07 DIAGNOSIS — I482 Chronic atrial fibrillation: Secondary | ICD-10-CM | POA: Diagnosis present

## 2018-03-07 DIAGNOSIS — L89302 Pressure ulcer of unspecified buttock, stage 2: Secondary | ICD-10-CM | POA: Diagnosis present

## 2018-03-07 DIAGNOSIS — M1990 Unspecified osteoarthritis, unspecified site: Secondary | ICD-10-CM | POA: Diagnosis present

## 2018-03-07 DIAGNOSIS — W010XXA Fall on same level from slipping, tripping and stumbling without subsequent striking against object, initial encounter: Secondary | ICD-10-CM | POA: Diagnosis present

## 2018-03-07 DIAGNOSIS — E785 Hyperlipidemia, unspecified: Secondary | ICD-10-CM | POA: Diagnosis present

## 2018-03-07 DIAGNOSIS — R609 Edema, unspecified: Secondary | ICD-10-CM

## 2018-03-07 DIAGNOSIS — M79641 Pain in right hand: Secondary | ICD-10-CM | POA: Diagnosis present

## 2018-03-07 DIAGNOSIS — Z87891 Personal history of nicotine dependence: Secondary | ICD-10-CM

## 2018-03-07 DIAGNOSIS — Z90710 Acquired absence of both cervix and uterus: Secondary | ICD-10-CM | POA: Diagnosis not present

## 2018-03-07 DIAGNOSIS — Z9109 Other allergy status, other than to drugs and biological substances: Secondary | ICD-10-CM

## 2018-03-07 DIAGNOSIS — Z8249 Family history of ischemic heart disease and other diseases of the circulatory system: Secondary | ICD-10-CM

## 2018-03-07 DIAGNOSIS — Z7951 Long term (current) use of inhaled steroids: Secondary | ICD-10-CM

## 2018-03-07 DIAGNOSIS — M6281 Muscle weakness (generalized): Secondary | ICD-10-CM | POA: Diagnosis not present

## 2018-03-07 DIAGNOSIS — Z7401 Bed confinement status: Secondary | ICD-10-CM | POA: Diagnosis not present

## 2018-03-07 DIAGNOSIS — L8990 Pressure ulcer of unspecified site, unspecified stage: Secondary | ICD-10-CM

## 2018-03-07 DIAGNOSIS — R52 Pain, unspecified: Secondary | ICD-10-CM | POA: Diagnosis not present

## 2018-03-07 DIAGNOSIS — I340 Nonrheumatic mitral (valve) insufficiency: Secondary | ICD-10-CM | POA: Diagnosis not present

## 2018-03-07 DIAGNOSIS — Z888 Allergy status to other drugs, medicaments and biological substances status: Secondary | ICD-10-CM

## 2018-03-07 DIAGNOSIS — Z111 Encounter for screening for respiratory tuberculosis: Secondary | ICD-10-CM | POA: Diagnosis not present

## 2018-03-07 DIAGNOSIS — Z91018 Allergy to other foods: Secondary | ICD-10-CM

## 2018-03-07 DIAGNOSIS — W19XXXA Unspecified fall, initial encounter: Secondary | ICD-10-CM

## 2018-03-07 DIAGNOSIS — Z886 Allergy status to analgesic agent status: Secondary | ICD-10-CM

## 2018-03-07 DIAGNOSIS — W19XXXD Unspecified fall, subsequent encounter: Secondary | ICD-10-CM | POA: Diagnosis not present

## 2018-03-07 DIAGNOSIS — E861 Hypovolemia: Secondary | ICD-10-CM | POA: Diagnosis present

## 2018-03-07 DIAGNOSIS — R2689 Other abnormalities of gait and mobility: Secondary | ICD-10-CM | POA: Diagnosis not present

## 2018-03-07 DIAGNOSIS — Z885 Allergy status to narcotic agent status: Secondary | ICD-10-CM

## 2018-03-07 DIAGNOSIS — M25531 Pain in right wrist: Secondary | ICD-10-CM | POA: Diagnosis not present

## 2018-03-07 DIAGNOSIS — J189 Pneumonia, unspecified organism: Secondary | ICD-10-CM | POA: Diagnosis not present

## 2018-03-07 LAB — CBC
HEMATOCRIT: 38 % (ref 36.0–46.0)
HEMOGLOBIN: 12 g/dL (ref 12.0–15.0)
MCH: 29.9 pg (ref 26.0–34.0)
MCHC: 31.6 g/dL (ref 30.0–36.0)
MCV: 94.5 fL (ref 78.0–100.0)
Platelets: 307 10*3/uL (ref 150–400)
RBC: 4.02 MIL/uL (ref 3.87–5.11)
RDW: 16 % — ABNORMAL HIGH (ref 11.5–15.5)
WBC: 10.7 10*3/uL — ABNORMAL HIGH (ref 4.0–10.5)

## 2018-03-07 LAB — URINALYSIS, ROUTINE W REFLEX MICROSCOPIC
Bilirubin Urine: NEGATIVE
Glucose, UA: NEGATIVE mg/dL
HGB URINE DIPSTICK: NEGATIVE
Ketones, ur: 20 mg/dL — AB
Nitrite: NEGATIVE
Protein, ur: 30 mg/dL — AB
Specific Gravity, Urine: 1.032 — ABNORMAL HIGH (ref 1.005–1.030)
pH: 6 (ref 5.0–8.0)

## 2018-03-07 LAB — TROPONIN I

## 2018-03-07 LAB — BASIC METABOLIC PANEL
ANION GAP: 13 (ref 5–15)
BUN: 12 mg/dL (ref 8–23)
CO2: 26 mmol/L (ref 22–32)
Calcium: 8.8 mg/dL — ABNORMAL LOW (ref 8.9–10.3)
Chloride: 97 mmol/L — ABNORMAL LOW (ref 98–111)
Creatinine, Ser: 0.64 mg/dL (ref 0.44–1.00)
GFR calc Af Amer: >60 mL/min (ref >60)
GFR calc non Af Amer: >60 mL/min (ref >60)
GLUCOSE: 115 mg/dL — AB (ref 70–99)
Potassium: 4.2 mmol/L (ref 3.5–5.1)
Sodium: 136 mmol/L (ref 135–145)

## 2018-03-07 LAB — I-STAT TROPONIN, ED: TROPONIN I, POC: 0.02 ng/mL (ref 0.00–0.08)

## 2018-03-07 LAB — HEMOGLOBIN A1C
Hgb A1c MFr Bld: 6 % — ABNORMAL HIGH (ref 4.8–5.6)
Mean Plasma Glucose: 125.5 mg/dL

## 2018-03-07 LAB — PROTIME-INR
INR: 1.95
Prothrombin Time: 22.1 seconds — ABNORMAL HIGH (ref 11.4–15.2)

## 2018-03-07 LAB — TSH: TSH: 0.847 u[IU]/mL (ref 0.350–4.500)

## 2018-03-07 LAB — MAGNESIUM: Magnesium: 1.8 mg/dL (ref 1.7–2.4)

## 2018-03-07 LAB — GLUCOSE, CAPILLARY: Glucose-Capillary: 104 mg/dL — ABNORMAL HIGH (ref 70–99)

## 2018-03-07 MED ORDER — WARFARIN - PHARMACIST DOSING INPATIENT
Freq: Every day | Status: DC
  Administered 2018-03-08: 18:00:00

## 2018-03-07 MED ORDER — ONDANSETRON HCL 4 MG/2ML IJ SOLN: 4.0000 mg | Freq: Four times a day (QID) | INTRAMUSCULAR | Status: DC | PRN

## 2018-03-07 MED ORDER — WARFARIN SODIUM 5 MG PO TABS
5.0000 mg | ORAL_TABLET | Freq: Once | ORAL | Status: AC
  Administered 2018-03-07: 5 mg via ORAL
  Filled 2018-03-07: qty 1

## 2018-03-07 MED ORDER — COLCHICINE 0.6 MG PO TABS
0.6000 mg | ORAL_TABLET | Freq: Once | ORAL | Status: AC
  Administered 2018-03-08: 0.6 mg via ORAL
  Filled 2018-03-07: qty 1

## 2018-03-07 MED ORDER — LORATADINE 10 MG PO TABS
10.0000 mg | ORAL_TABLET | Freq: Every day | ORAL | Status: DC
  Administered 2018-03-08 – 2018-03-11 (×4): 10 mg via ORAL
  Filled 2018-03-07 (×4): qty 1

## 2018-03-07 MED ORDER — SODIUM CHLORIDE 0.9 % IV SOLN: INTRAVENOUS | Status: DC

## 2018-03-07 MED ORDER — METOPROLOL SUCCINATE ER 50 MG PO TB24
150.0000 mg | ORAL_TABLET | Freq: Every day | ORAL | Status: DC
  Administered 2018-03-07 – 2018-03-10 (×4): 150 mg via ORAL
  Filled 2018-03-07 (×4): qty 1

## 2018-03-07 MED ORDER — COLCHICINE 0.6 MG PO TABS
1.2000 mg | ORAL_TABLET | Freq: Once | ORAL | Status: AC
  Administered 2018-03-07: 1.2 mg via ORAL
  Filled 2018-03-07: qty 2

## 2018-03-07 MED ORDER — METHOCARBAMOL 750 MG PO TABS
750.0000 mg | ORAL_TABLET | Freq: Three times a day (TID) | ORAL | Status: DC
  Administered 2018-03-07 – 2018-03-11 (×11): 750 mg via ORAL
  Filled 2018-03-07 (×11): qty 1

## 2018-03-07 MED ORDER — ACETAMINOPHEN 650 MG RE SUPP: 650.0000 mg | Freq: Four times a day (QID) | RECTAL | Status: DC | PRN

## 2018-03-07 MED ORDER — DOCUSATE SODIUM 100 MG PO CAPS
100.0000 mg | ORAL_CAPSULE | Freq: Every day | ORAL | Status: DC
  Administered 2018-03-08 – 2018-03-09 (×2): 100 mg via ORAL
  Filled 2018-03-07 (×4): qty 1

## 2018-03-07 MED ORDER — PREDNISONE 20 MG PO TABS: 40.0000 mg | ORAL_TABLET | Freq: Every day | ORAL | Status: DC

## 2018-03-07 MED ORDER — ACETAMINOPHEN 325 MG PO TABS: 650.0000 mg | ORAL_TABLET | Freq: Four times a day (QID) | ORAL | Status: DC | PRN

## 2018-03-07 MED ORDER — ONDANSETRON HCL 4 MG PO TABS: 4.0000 mg | ORAL_TABLET | Freq: Four times a day (QID) | ORAL | Status: DC | PRN

## 2018-03-07 MED ORDER — COLCHICINE 0.6 MG PO TABS
0.6000 mg | ORAL_TABLET | Freq: Two times a day (BID) | ORAL | Status: DC
  Administered 2018-03-08 – 2018-03-11 (×7): 0.6 mg via ORAL
  Filled 2018-03-07 (×7): qty 1

## 2018-03-07 MED ORDER — TRAMADOL HCL 50 MG PO TABS
50.0000 mg | ORAL_TABLET | Freq: Once | ORAL | Status: AC
  Administered 2018-03-07: 50 mg via ORAL
  Filled 2018-03-07: qty 1

## 2018-03-07 MED ORDER — INSULIN ASPART 100 UNIT/ML ~~LOC~~ SOLN
0.0000 [IU] | Freq: Three times a day (TID) | SUBCUTANEOUS | Status: DC
  Administered 2018-03-08 – 2018-03-09 (×2): 1 [IU] via SUBCUTANEOUS

## 2018-03-07 MED ORDER — PANTOPRAZOLE SODIUM 40 MG PO TBEC
40.0000 mg | DELAYED_RELEASE_TABLET | Freq: Every day | ORAL | Status: DC
  Administered 2018-03-08 – 2018-03-11 (×4): 40 mg via ORAL
  Filled 2018-03-07 (×4): qty 1

## 2018-03-07 MED ORDER — DILTIAZEM HCL-DEXTROSE 100-5 MG/100ML-% IV SOLN (PREMIX)
5.0000 mg/h | INTRAVENOUS | Status: AC
  Administered 2018-03-07 – 2018-03-08 (×3): 5 mg/h via INTRAVENOUS
  Filled 2018-03-07 (×3): qty 100

## 2018-03-07 MED ORDER — GLIMEPIRIDE 1 MG PO TABS
1.0000 mg | ORAL_TABLET | Freq: Every day | ORAL | Status: DC
  Administered 2018-03-08 – 2018-03-11 (×4): 1 mg via ORAL
  Filled 2018-03-07 (×4): qty 1

## 2018-03-07 MED ORDER — FLUTICASONE FUROATE-VILANTEROL 100-25 MCG/INH IN AEPB
1.0000 | INHALATION_SPRAY | Freq: Every day | RESPIRATORY_TRACT | Status: DC
  Filled 2018-03-07: qty 28

## 2018-03-07 MED ORDER — DILTIAZEM LOAD VIA INFUSION
10.0000 mg | Freq: Once | INTRAVENOUS | Status: AC
  Administered 2018-03-07: 10 mg via INTRAVENOUS
  Filled 2018-03-07: qty 10

## 2018-03-07 MED ORDER — METFORMIN HCL 500 MG PO TABS
500.0000 mg | ORAL_TABLET | Freq: Two times a day (BID) | ORAL | Status: DC
  Administered 2018-03-08 – 2018-03-11 (×7): 500 mg via ORAL
  Filled 2018-03-07 (×7): qty 1

## 2018-03-07 NOTE — H&P (Addendum)
History and Physical    Casey Patel PYK:[id number] DOB: 07/27/1929 DOA: 03/07/2018  PCP: [name], MD   Patient coming from: Home  Chief Complaint: Fall, weakness  HPI: Casey Patel is a 84 y.o. female with medical history significant for atrial fibrillation, HTN, Asthma, DM, presented to the ED, with reports of a fall 8/31 came to the ED, fell on her left knee, deemed mechanical fall and then again, past Saturday -7/9. Last fall, patient was stepping out of the shower, didn't lift her leg as high as she should have and tripped, falling on her butt. Both falls were witnessed, by sister and grandson, both of which 100% setting patient did not hit her head.  Since fall this past Saturday, patient reports increased generalized weakness and pain with resultant inability to ambulate.  At baseline patient ambulates with a walker or wheelchair.  Patient also reports right hand pain redness and swelling of one day duration, that is improving.  Denies history of gout, or trauma to right hand.  She reports similar presentation several year ago, that resolved by next morning with just prednisone, and reports at that time her uric acid level is normal. No fever or chills.  No shortness of breath no cough.  Denies dysuria. chronic worsening poor p.o. Intake.  Reports compliance with metoprolol and Coumadin.   ED Course: Tachycardic to 539, bp systolic low 767H - 419.[name]  BBC 10.7, otherwise unremarkable CBC CMP.  INR 1.95. negative i-STAT troponin.  Two-view chest x-ray negative for acute abnormality.  EKG shows atrial fibrillation, incomplete RBBB.  Patient was started on Cardizem drip in the ED, heart rate improved to low 110s. Hospitlaist called to admit for A. fib with RVR.  Review of Systems: As per HPI all other systems reviewed and negative  Past Medical History:  Diagnosis Date  . AF (atrial fibrillation) ([place])   . Arthritis   . Asthma   . Diabetes mellitus    TYPE 2  . Hyperlipidemia   .  Hypertension   . PNA (pneumonia)     Past Surgical History:  Procedure Laterality Date  . ABDOMINAL HYSTERECTOMY  1976  . BACK SURGERY     lumb x 3  . CARDIOVERSION  12/07/2011   Procedure: CARDIOVERSION;  Surgeon: Peter M Martinique, MD;  Location: [facility];  Service: Cardiovascular;  Laterality: N/A;  . CARPAL TUNNEL RELEASE  09/15/2011   Procedure: CARPAL TUNNEL RELEASE;  Surgeon: Wynonia Sours, MD;  Location: [facility];  Service: Orthopedics;  Laterality: Right;  . CATARACT EXTRACTION    . CHOLECYSTECTOMY  2003  . HAND TENDON SURGERY  1999   left wrist - thumb  . LAMINECTOMY  1999   with fusion (L1-5)  . LAMINECTOMY  [id number]   with fusion (L3-4)  . TONSILLECTOMY  1952     reports that she quit smoking about 39 years ago. Her smoking use included cigarettes. She has a 16.00 pack-year smoking history. She has never used smokeless tobacco. She reports that she does not drink alcohol or use drugs.  Allergies  Allergen Reactions  . Wheat Bran Shortness Of Breath  . Adhesive [Tape] Other (See Comments)    Blisters on skin  . Kiwi Extract Other (See Comments)    Causes mouth sores  . Orange Fruit [Citrus] Other (See Comments)    Causes mouth sores  . Pineapple Other (See Comments)    Causes mouth sores  . Promethazine Hcl Other (See Comments)  Disorientation   . Tomato Other (See Comments)    Causes mouth sores  . Tylenol [Acetaminophen] Other (See Comments)    Causes fast heart rate  . Vistaril [Hydroxyzine Hcl] Other (See Comments)    confusion  . Codeine Itching and Rash    Family History  Problem Relation Age of Onset  . Hypertension Mother 74  . Stroke Mother 52  . Arthritis Mother 13  . Heart attack Father 51  . Heart disease Father 61  . Hypertension Father 12  . Ulcers Father 46  . Breast cancer Sister   . Heart disease Brother        CONGENTIAL HEART DISEASE  . Heart disease Sister   . Diabetes Sister   . Breast cancer Sister     Prior to  Admission medications   Medication Sig Start Date End Date Taking? Authorizing Provider  Cholecalciferol (VITAMIN D) 2000 UNITS CAPS Take 2,000 Units by mouth daily.   Yes [provider]  docusate sodium (COLACE) 100 MG capsule Take 100 mg by mouth daily.   Yes [provider]  fexofenadine (ALLEGRA) 180 MG tablet Take 180 mg by mouth daily at 6 PM.    Yes [provider]  fluticasone furoate-vilanterol (BREO ELLIPTA) 100-25 MCG/INH AEPB Inhale 1 puff into the lungs at bedtime.   Yes [provider]  glimepiride (AMARYL) 1 MG tablet Take 1 mg by mouth daily with breakfast.  02/06/14  Yes [provider]  metFORMIN (GLUCOPHAGE) 500 MG tablet Take 500 mg by mouth 2 (two) times daily with a meal.  02/06/14  Yes [provider]  methocarbamol (ROBAXIN) 750 MG tablet Take 750 mg by mouth 3 (three) times daily.    Yes [provider]  metoprolol (TOPROL-XL) 100 MG 24 hr tablet Take 150 mg by mouth at bedtime.    Yes [provider]  omeprazole (PRILOSEC) 20 MG capsule Take 20 mg by mouth daily.   Yes [provider]  traMADol (ULTRAM) 50 MG tablet Take 50 mg by mouth every 6 (six) hours as needed (pain).    Yes [provider]  warfarin (COUMADIN) 5 MG tablet Take 2.5-5 mg by mouth See admin instructions. Take 1 tablet (5 mg) by mouth on Sunday and Thursday at 5pm, take 1/2 tablet (2.5 mg) on Monday, Tuesday, Wednesday, Friday, Saturday at 5pm   Yes [provider]  furosemide (LASIX) 20 MG tablet Take 20 mg by mouth Daily. 12/09/11   [provider]    Physical Exam: Vitals:   03/07/18 1730 03/07/18 1800 03/07/18 1815 03/07/18 1830  BP: 122/79 118/74  121/76  Pulse: 98 98 92 96  Resp: (!) 23 (!) 28 (!) 21 (!) 25  Temp:      TempSrc:      SpO2: 100% 96% 96% 98%    Constitutional: NAD, calm, comfortable Vitals:   03/07/18 1730 03/07/18 1800 03/07/18 1815 03/07/18 1830  BP: 122/79 118/74   121/76  Pulse: 98 98 92 96  Resp: (!) 23 (!) 28 (!) 21 (!) 25  Temp:      TempSrc:      SpO2: 100% 96% 96% 98%   Eyes: PERRL, lids and conjunctivae normal ENMT: Mucous membranes are dry. Posterior pharynx clear of any exudate or lesions. Neck: normal, supple, no masses, no thyromegaly. Kyphotic neck. Respiratory: clear to auscultation bilaterally, no wheezing, no crackles. Normal respiratory effort. No accessory muscle use.  Cardiovascular: Mildly tachycardic, Irregular rate and rhythm, no  murmurs / rubs / gallops. No extremity edema. 2+ pedal pulses.  Abdomen: no tenderness, no masses palpated. No hepatosplenomegaly. Bowel sounds positive.  Musculoskeletal: no clubbing / cyanosis. Right hand- swelling, with erythema, mild differential warmth, swelling more on volar surface of metatarsal area than wrist, swelling extending distally to mid wrist, firm, tender to palpation, reduced range of motion 2/2 pain. Left knee-no obvious swelling redness or tenderness or tenderness on palpation. Skin: no rashes, lesions, ulcers. No induration Neurologic: CN 2-12 grossly intact. Strength 4+ /5 in all 4.  Psychiatric: Normal judgment and insight. Alert and oriented x 3. Normal mood.         Labs on Admission: I have personally reviewed following labs and imaging studies  CBC: Recent Labs  Lab 03/07/18 1318  WBC 10.7*  HGB 12.0  HCT 38.0  MCV 94.5  PLT 829   Basic Metabolic Panel: Recent Labs  Lab 03/07/18 1318  NA 136  K 4.2  CL 97*  CO2 26  GLUCOSE 115*  BUN 12  CREATININE 0.64  CALCIUM 8.8*   Coagulation Profile: Recent Labs  Lab 03/07/18 1318  INR 1.95   Radiological Exams on Admission: Dg Chest 2 View  Result Date: 03/07/2018 CLINICAL DATA:  Weakness. EXAM: CHEST - 2 VIEW COMPARISON:  None. FINDINGS: Mild cardiomegaly is noted. No pneumothorax or pleural effusion is noted. Calcified granuloma is noted in right midlung. No consolidative process is noted. The visualized  skeletal structures are unremarkable. IMPRESSION: No active cardiopulmonary disease. Electronically Signed   By: Marijo Conception, M.D.   On: 03/07/2018 14:42    EKG: Independently reviewed.  Atrial fibrillation, with RVR.  Rate 128.  QTc 408.  Incomplete right bundle branch block.  QRS 98.  Assessment/Plan Principal Problem:   Atrial fibrillation (HCC) Active Problems:   Arthritis   Hypertension   Asthma   DM (diabetes mellitus) (HCC)   A. fib with RVR- rates 149 >> 99.  Blood pressure systolic greater than 937.  Compliant with home metoprolol and Coumadin.  INR- 1.95.  Follows with cardiologist Dr. Martinique. No Echo on file. -Continue Cardizem drip -Continue home metoprolol 150 mg daily - Warfarin per pharmacy - ECHO, she might require CCB for rate control in addition to BB - Recent frequent falls, may need to evaluate indication for anticoagulation -Tropes x3 -Magnesium, TSH - N/s 100cc/hr X 15 hrs.  Mechanical falls-secondary to weakness, with poor p.o. intake.  Chronic worsening left knee pain-x-ray 8/31-large joint effusion, advanced tricompartmental osteoarthritis.  Hold off on head CT, though patient is on anticoagulation, as patient and family who witnessed fall are at bedside, and deny patient hit her head. -PT eval -X-ray pelvis -May need to reevaluate anticoagulation in the setting of falls  Right hand pain-presentation suggest gouty arthritis. Reports similar history, improved with steroids. WBC 10.7.  -X-ray right hand-chest demineralization with extensive degenerative changes - Colchicine - Hesistant to give this 85- y o DM, NSAIDs or steroids, can consider pending clinical improvement  DM-glucose 115.  No recent A1c on file - ssi -New home forming and glipizide -Hemoglobin A1c.  HTN-stable.  Compliant with metoprolol.  Takes Lasix 20 mg daily, but has not taken in the past 4 days, since fall. -Continue metoprolol -Monitor blood pressure while on Cardizem   DVT  prophylaxis: Warfarin per pharm Code Status: Full-confirmed at bedside with patient and grandson and sister- Janalyn Shy who is primary decision maker Family Communication: Grandson and Sister- Janalyn Shy at bedside Disposition Plan:  Per rounding team Consults called:  None  Admission status: Inpt as she is requiring step down level of care.    Bethena Roys MD [facility] Pager [phone] From 6PM-2AM.  Otherwise please contact night-coverage [email or web address] Password [email or web address]  03/07/2018, 7:22 PM

## 2018-03-07 NOTE — Progress Notes (Signed)
ANTICOAGULATION CONSULT NOTE - Initial Consult  Pharmacy Consult for warfarin  Indication: atrial fibrillation  Allergies  Allergen Reactions  . Wheat Bran Shortness Of Breath  . Adhesive [Tape] Other (See Comments)    Blisters on skin  . Kiwi Extract Other (See Comments)    Causes mouth sores  . Orange Fruit [Citrus] Other (See Comments)    Causes mouth sores  . Pineapple Other (See Comments)    Causes mouth sores  . Promethazine Hcl Other (See Comments)    Disorientation   . Tomato Other (See Comments)    Causes mouth sores  . Tylenol [Acetaminophen] Other (See Comments)    Causes fast heart rate  . Vistaril [Hydroxyzine Hcl] Other (See Comments)    confusion  . Codeine Itching and Rash    Patient Measurements:   Heparin Dosing Weight:   Vital Signs: Temp: 97.4 F (36.3 C) (09/09 1255) Temp Source: Oral (09/09 1255) BP: 121/76 (09/09 1830) Pulse Rate: 96 (09/09 1830)  Labs: Recent Labs    03/07/18 1318  HGB 12.0  HCT 38.0  PLT 307  LABPROT 22.1*  INR 1.95  CREATININE 0.64    CrCl cannot be calculated (Unknown ideal weight.).   Medical History: Past Medical History:  Diagnosis Date  . AF (atrial fibrillation) ([place])   . Arthritis   . Asthma   . Diabetes mellitus    TYPE 2  . Hyperlipidemia   . Hypertension   . PNA (pneumonia)     Medications:    Assessment: 84yo F admitted for weakness and fatigue on chronic anticoagulation for Afib with warfarin. CBC WNL. INR slightly below goal at 1.95  PTA warfarin regimen: 2.5 mg daily, except 5 mg on Sun and Thurs  Goal of Therapy:  INR 2-3 Monitor platelets by anticoagulation protocol: Yes   Plan:  Warfarin 5 mg x1 Daily INR, monitor s/s bleeding   Harrietta Guardian, PharmD PGY1 Pharmacy Resident 03/07/2018    7:26 PM

## 2018-03-07 NOTE — ED Triage Notes (Signed)
Pt arrived by [facility] from home. Reports tripping and falling last week. Having decreased intake since then and has generalized weakness. Hx of afib, hr 120 pta.

## 2018-03-07 NOTE — ED Provider Notes (Signed)
[facility] EMERGENCY DEPARTMENT Provider Note   CSN: [id number] Arrival date & time: 03/07/18  1239     History   Chief Complaint Chief Complaint  Patient presents with  . Weakness    HPI Casey Patel is a 84 y.o. female with PMHx A. fib, type 2 diabetes, hypertension, presenting to the ED with progressively worsening generalized weakness and fatigue.  Patient reports she was seen in the ED on 02/26/2018 after a mechanical fall with minor injury to her left knee and has been feeling worse since.  She states she has been having pain with ambulation and started have pain in the other leg due to redistributing her weight. Reports assoc pain in her left knee and ankle.  She states she had another mechanical fall on Saturday, without head trauma, and has been feeling worse since.  Family endorses she has been having trouble with ambulation since the initial fall and her weakness has gotten significantly worse since the fall on Saturday.  Reports associated decreased appetite.  She reports she is compliant with all of her daily medications, including Coumadin.  Denies chest pain, shortness of breath, headache, vision changes, decreased sensation, abdominal pain, or any other complaints. Pt's cardiologist is [name].   Chart review, patient seen in the ED on 02/26/2018 with imaging done of her left knee showing joint effusion and arthritis.  No other acute pathology.  The history is provided by the patient and a relative.    Past Medical History:  Diagnosis Date  . AF (atrial fibrillation) ([place])   . Arthritis   . Asthma   . Diabetes mellitus    TYPE 2  . Hyperlipidemia   . Hypertension   . PNA (pneumonia)     Patient Active Problem List   Diagnosis Date Noted  . Atrial fibrillation ([place]) 03/07/2018  . Cough 01/01/2012  . Pneumonia 12/03/2011  . Arthritis   . Hypertension   . Hyperlipidemia   . AF (atrial fibrillation) ([place])   . Asthma   . Diabetes  mellitus     Past Surgical History:  Procedure Laterality Date  . ABDOMINAL HYSTERECTOMY  1976  . BACK SURGERY     lumb x 3  . CARDIOVERSION  12/07/2011   Procedure: CARDIOVERSION;  Surgeon: Peter M Martinique, MD;  Location: [facility];  Service: Cardiovascular;  Laterality: N/A;  . CARPAL TUNNEL RELEASE  09/15/2011   Procedure: CARPAL TUNNEL RELEASE;  Surgeon: Wynonia Sours, MD;  Location: [facility];  Service: Orthopedics;  Laterality: Right;  . CATARACT EXTRACTION    . CHOLECYSTECTOMY  2003  . HAND TENDON SURGERY  1999   left wrist - thumb  . LAMINECTOMY  1999   with fusion (L1-5)  . LAMINECTOMY  [id number]   with fusion (L3-4)  . TONSILLECTOMY  1952     OB History   None      Home Medications    Prior to Admission medications   Medication Sig Start Date End Date Taking? Authorizing Provider  Cholecalciferol (VITAMIN D) 2000 UNITS CAPS Take 2,000 Units by mouth daily.   Yes [provider]  docusate sodium (COLACE) 100 MG capsule Take 100 mg by mouth daily.   Yes [provider]  fexofenadine (ALLEGRA) 180 MG tablet Take 180 mg by mouth daily at 6 PM.    Yes [provider]  fluticasone furoate-vilanterol (BREO ELLIPTA) 100-25 MCG/INH AEPB Inhale 1 puff into the lungs at bedtime.   Yes  [provider]  glimepiride (AMARYL) 1 MG tablet Take 1 mg by mouth daily with breakfast.  02/06/14  Yes [provider]  metFORMIN (GLUCOPHAGE) 500 MG tablet Take 500 mg by mouth 2 (two) times daily with a meal.  02/06/14  Yes [provider]  methocarbamol (ROBAXIN) 750 MG tablet Take 750 mg by mouth 3 (three) times daily.    Yes [provider]  metoprolol (TOPROL-XL) 100 MG 24 hr tablet Take 150 mg by mouth at bedtime.    Yes [provider]  omeprazole (PRILOSEC) 20 MG capsule Take 20 mg by mouth daily.   Yes [provider]  traMADol (ULTRAM) 50 MG tablet Take 50 mg by mouth every 6 (six) hours as needed  (pain).    Yes [provider]  warfarin (COUMADIN) 5 MG tablet Take 2.5-5 mg by mouth See admin instructions. Take 1 tablet (5 mg) by mouth on Sunday and Thursday at 5pm, take 1/2 tablet (2.5 mg) on Monday, Tuesday, Wednesday, Friday, Saturday at 5pm   Yes [provider]  furosemide (LASIX) 20 MG tablet Take 20 mg by mouth Daily. 12/09/11   [provider]    Family History Family History  Problem Relation Age of Onset  . Hypertension Mother 35  . Stroke Mother 20  . Arthritis Mother 30  . Heart attack Father 37  . Heart disease Father 65  . Hypertension Father 57  . Ulcers Father 78  . Breast cancer Sister   . Heart disease Brother        CONGENTIAL HEART DISEASE  . Heart disease Sister   . Diabetes Sister   . Breast cancer Sister     Social History Social History   Tobacco Use  . Smoking status: Former Smoker    Packs/day: 0.80    Years: 20.00    Pack years: 16.00    Types: Cigarettes    Last attempt to quit: 06/29/1978    Years since quitting: 39.7  . Smokeless tobacco: Never Used  Substance Use Topics  . Alcohol use: No  . Drug use: No     Allergies   Wheat bran; Adhesive [tape]; Kiwi extract; Orange fruit [citrus]; Pineapple; Promethazine hcl; Tomato; Tylenol [acetaminophen]; Vistaril [hydroxyzine hcl]; and Codeine   Review of Systems Review of Systems  Constitutional: Positive for fatigue. Negative for fever.  Eyes: Negative for visual disturbance.  Respiratory: Negative for shortness of breath.   Cardiovascular: Negative for chest pain and palpitations.  Gastrointestinal: Negative for abdominal pain.  Musculoskeletal: Positive for arthralgias.  Neurological: Positive for weakness (generalized). Negative for dizziness, syncope, facial asymmetry, speech difficulty, numbness and headaches.  Hematological: Bruises/bleeds easily (on coumadin).  All other systems reviewed and are negative.    Physical Exam Updated Vital Signs BP  121/76   Pulse 96   Temp (!) 97.4 F (36.3 C) (Oral)   Resp (!) 25   SpO2 98%   Physical Exam  Constitutional: She appears well-developed and well-nourished. No distress.  HENT:  Head: Normocephalic and atraumatic.  Eyes: Pupils are equal, round, and reactive to light. Conjunctivae and EOM are normal.  Cardiovascular: Intact distal pulses. An irregularly irregular rhythm present. Tachycardia present.  Pulmonary/Chest: Effort normal and breath sounds normal. No respiratory distress.  Abdominal: Soft. Bowel sounds are normal. She exhibits no mass. There is no tenderness. There is no guarding.  Musculoskeletal:  No obvious deformity to lower extremities, left knee without erythema or warmth.  No tenderness to calves bilaterally.  Neurological: She is alert.  Skin: Skin is warm.  Psychiatric: She has a normal mood and affect. Her behavior is normal.  Nursing note and vitals reviewed.    ED Treatments / Results  Labs (all labs ordered are listed, but only abnormal results are displayed) Labs Reviewed  BASIC METABOLIC PANEL - Abnormal; Notable for the following components:      Result Value   Chloride 97 (*)    Glucose, Bld 115 (*)    Calcium 8.8 (*)    All other components within normal limits  CBC - Abnormal; Notable for the following components:   WBC 10.7 (*)    RDW 16.0 (*)    All other components within normal limits  PROTIME-INR - Abnormal; Notable for the following components:   Prothrombin Time 22.1 (*)    All other components within normal limits  URINALYSIS, ROUTINE W REFLEX MICROSCOPIC  TROPONIN I  TROPONIN I  TROPONIN I  MAGNESIUM  TSH  CBG MONITORING, ED  I-STAT TROPONIN, ED    EKG EKG Interpretation  Date/Time:  Monday March 07 2018 13:05:55 EDT Ventricular Rate:  128 PR Interval:    QRS Duration: 98 QT Interval:  280 QTC Calculation: 408 R Axis:   51 Text Interpretation:  Atrial fibrillation with rapid ventricular response Incomplete right  bundle branch block Nonspecific ST and T wave abnormality No significant change since last tracing Confirmed by Blanchie Dessert [phone]) on 03/07/2018 3:42:22 PM   Radiology Dg Chest 2 View  Result Date: 03/07/2018 CLINICAL DATA:  Weakness. EXAM: CHEST - 2 VIEW COMPARISON:  None. FINDINGS: Mild cardiomegaly is noted. No pneumothorax or pleural effusion is noted. Calcified granuloma is noted in right midlung. No consolidative process is noted. The visualized skeletal structures are unremarkable. IMPRESSION: No active cardiopulmonary disease. Electronically Signed   By: Marijo Conception, M.D.   On: 03/07/2018 14:42    Procedures Procedures (including critical care time) CRITICAL CARE Performed by: Martinique N Robinson   Total critical care time: 30 minutes  Critical care time was exclusive of separately billable procedures and treating other patients.  Critical care was necessary to treat or prevent imminent or life-threatening deterioration.  Critical care was time spent personally by me on the following activities: development of treatment plan with patient and/or surrogate as well as nursing, discussions with consultants, evaluation of patient's response to treatment, examination of patient, obtaining history from patient or surrogate, ordering and performing treatments and interventions, ordering and review of laboratory studies, ordering and review of radiographic studies, pulse oximetry and re-evaluation of patient's condition.   Medications Ordered in ED Medications  diltiazem (CARDIZEM) 1 mg/mL load via infusion 10 mg (10 mg Intravenous Bolus from Bag 03/07/18 1653)    And  diltiazem (CARDIZEM) 100 mg in dextrose 5% 137mL (1 mg/mL) infusion (5 mg/hr Intravenous New Bag/Given 03/07/18 1855)  traMADol (ULTRAM) tablet 50 mg (50 mg Oral Given 03/07/18 1850)     Initial Impression / Assessment and Plan / ED Course  I have reviewed the triage vital signs and the nursing notes.  Pertinent labs  & imaging results that were available during my care of the patient were reviewed by me and considered in my medical decision making (see chart for details).  Clinical Course as of Mar 07 1924  Mon Mar 07, 2018  1800 Pt HR improved to 98. Pt re-evaluated and agrees with plan for admission. Will continue to monitor. Consult placed for admission.   [JR]  9937 Dr.  Emokpae accepting admission.   [JR]    Clinical Course User Index [JR] Robinson, Martinique N, PA-C    Patient with history of chronic atrial fibrillation, presenting to the ED with about 1 week of worsening generalized weakness and fatigue.  On arrival, patient found to be in A. fib with RVR.  Patient denies chest pain, shortness of breath, or other complaints other than pain to the left lower leg from mechanical fall.  Patient is on Coumadin and compliant though is subtherapeutic with INR 1.95.  Labs are otherwise unremarkable.  Patient treated the ED with diltiazem drip and will be admitted for rate control.  Hospitalist consulted, Dr. Denton Brick accepting admission.  The patient appears reasonably stabilized for admission considering the current resources, flow, and capabilities available in the ED at this time, and I doubt any other [facility] requiring further screening and/or treatment in the ED prior to admission.  Final Clinical Impressions(s) / ED Diagnoses   Final diagnoses:  Atrial fibrillation with rapid ventricular response [facility])    ED Discharge Orders    None       Robinson, Martinique N, PA-C 03/07/18 1926    Blanchie Dessert, MD 03/08/18 1357

## 2018-03-08 ENCOUNTER — Inpatient Hospital Stay (HOSPITAL_COMMUNITY): Payer: Medicare Other

## 2018-03-08 DIAGNOSIS — I340 Nonrheumatic mitral (valve) insufficiency: Secondary | ICD-10-CM

## 2018-03-08 DIAGNOSIS — N390 Urinary tract infection, site not specified: Principal | ICD-10-CM

## 2018-03-08 DIAGNOSIS — L8990 Pressure ulcer of unspecified site, unspecified stage: Secondary | ICD-10-CM

## 2018-03-08 LAB — MRSA PCR SCREENING: MRSA by PCR: POSITIVE — AB

## 2018-03-08 LAB — TROPONIN I: Troponin I: <0.03 ng/mL (ref <0.03)

## 2018-03-08 LAB — PROTIME-INR
INR: 2.52
Prothrombin Time: 27 seconds — ABNORMAL HIGH (ref 11.4–15.2)

## 2018-03-08 LAB — GLUCOSE, CAPILLARY
GLUCOSE-CAPILLARY: 123 mg/dL — AB (ref 70–99)
GLUCOSE-CAPILLARY: 91 mg/dL (ref 70–99)
GLUCOSE-CAPILLARY: 95 mg/dL (ref 70–99)
Glucose-Capillary: 86 mg/dL (ref 70–99)

## 2018-03-08 LAB — ECHOCARDIOGRAM COMPLETE
Height: 66 in
WEIGHTICAEL: 3333.36 [oz_av]

## 2018-03-08 MED ORDER — CHLORHEXIDINE GLUCONATE CLOTH 2 % EX PADS
6.0000 | MEDICATED_PAD | Freq: Every day | CUTANEOUS | Status: DC
  Administered 2018-03-08 – 2018-03-11 (×4): 6 via TOPICAL

## 2018-03-08 MED ORDER — GLUCERNA SHAKE PO LIQD
237.0000 mL | Freq: Three times a day (TID) | ORAL | Status: DC
  Administered 2018-03-08 – 2018-03-10 (×4): 237 mL via ORAL

## 2018-03-08 MED ORDER — FLUTICASONE FUROATE-VILANTEROL 100-25 MCG/INH IN AEPB
1.0000 | INHALATION_SPRAY | Freq: Every day | RESPIRATORY_TRACT | Status: DC
  Administered 2018-03-08 – 2018-03-10 (×3): 1 via RESPIRATORY_TRACT
  Filled 2018-03-08: qty 28

## 2018-03-08 MED ORDER — ENSURE ENLIVE PO LIQD: 237.0000 mL | Freq: Two times a day (BID) | ORAL | Status: DC

## 2018-03-08 MED ORDER — WARFARIN SODIUM 2.5 MG PO TABS
2.5000 mg | ORAL_TABLET | Freq: Once | ORAL | Status: AC
  Administered 2018-03-08: 2.5 mg via ORAL
  Filled 2018-03-08: qty 1

## 2018-03-08 MED ORDER — MUPIROCIN 2 % EX OINT
1.0000 "application " | TOPICAL_OINTMENT | Freq: Two times a day (BID) | CUTANEOUS | Status: DC
  Administered 2018-03-08 – 2018-03-11 (×7): 1 via NASAL
  Filled 2018-03-08: qty 22

## 2018-03-08 MED ORDER — PERFLUTREN LIPID MICROSPHERE
1.0000 mL | INTRAVENOUS | Status: AC | PRN
  Administered 2018-03-08 (×3): 2 mL via INTRAVENOUS
  Filled 2018-03-08: qty 10

## 2018-03-08 MED ORDER — SODIUM CHLORIDE 0.9 % IV SOLN
1.0000 g | INTRAVENOUS | Status: DC
  Administered 2018-03-08 – 2018-03-10 (×3): 1 g via INTRAVENOUS
  Filled 2018-03-08 (×4): qty 10

## 2018-03-08 MED ORDER — TRAMADOL HCL 50 MG PO TABS
50.0000 mg | ORAL_TABLET | Freq: Four times a day (QID) | ORAL | Status: DC | PRN
  Administered 2018-03-08 (×2): 50 mg via ORAL
  Filled 2018-03-08 (×2): qty 1

## 2018-03-08 NOTE — Progress Notes (Signed)
Initial Nutrition Assessment  DOCUMENTATION CODES:   Obesity unspecified  INTERVENTION:    Glucerna Shake po TID, each supplement provides 220 kcal and 10 grams of protein  NUTRITION DIAGNOSIS:   Inadequate oral intake related to decreased appetite as evidenced by per patient/family report.  GOAL:   Patient will meet greater than or equal to 90% of their needs  MONITOR:   PO intake, Supplement acceptance  REASON FOR ASSESSMENT:   Malnutrition Screening Tool    ASSESSMENT:   84 yo female with PMH of HTN, HLD, A fib, asthma, and DM who was admitted on 9/9 with weakness, s/p fall.   Patient reports that she has been eating poorly for the past 1.5 weeks due to decrease in appetite after recent falls at home. She has never tried PO supplements, but is willing to try chocolate Glucerna Shakes between meals. She denies trouble chewing or swallowing.   Labs reviewed. CBG's: 86-123 Medications reviewed and include colace, novolog, amaryl, glucophage.   NUTRITION - FOCUSED PHYSICAL EXAM:    Most Recent Value  Orbital Region  No depletion  Upper Arm Region  No depletion  Thoracic and Lumbar Region  No depletion  Buccal Region  No depletion  Temple Region  Mild depletion  Clavicle Bone Region  No depletion  Clavicle and Acromion Bone Region  No depletion  Scapular Bone Region  No depletion  Dorsal Hand  No depletion  Patellar Region  No depletion  Anterior Thigh Region  No depletion  Posterior Calf Region  Mild depletion  Edema (RD Assessment)  None  Hair  Reviewed  Eyes  Reviewed  Mouth  Reviewed  Skin  Reviewed  Nails  Reviewed       Diet Order:   Diet Order            Diet heart healthy/carb modified Room service appropriate? Yes; Fluid consistency: Thin  Diet effective now              EDUCATION NEEDS:   Education needs have been addressed  Skin:  Skin Assessment: Skin Integrity Issues: Skin Integrity Issues:: Stage I, Stage II, Other  (Comment) Stage I: buttocks Stage II: buttocks Other: MASD   Last BM:  9/10  Height:   Ht Readings from Last 1 Encounters:  03/07/18 5\' 6"  (1.676 m)    Weight:   Wt Readings from Last 1 Encounters:  03/08/18 94.5 kg    Ideal Body Weight:  59.1 kg  BMI:  Body mass index is 33.63 kg/m.  Estimated Nutritional Needs:   Kcal:  1500-1700  Protein:  95-105 gm  Fluid:  1.8 L    Molli Barrows, RD, LDN, [place] Pager [phone] After Hours Pager [phone]

## 2018-03-08 NOTE — Progress Notes (Signed)
[facility] for warfarin  Indication: atrial fibrillation  Allergies  Allergen Reactions  . Wheat Bran Shortness Of Breath  . Adhesive [Tape] Other (See Comments)    Blisters on skin  . Kiwi Extract Other (See Comments)    Causes mouth sores  . Orange Fruit [Citrus] Other (See Comments)    Causes mouth sores  . Pineapple Other (See Comments)    Causes mouth sores  . Promethazine Hcl Other (See Comments)    Disorientation   . Tomato Other (See Comments)    Causes mouth sores  . Tylenol [Acetaminophen] Other (See Comments)    Causes fast heart rate  . Vistaril [Hydroxyzine Hcl] Other (See Comments)    confusion  . Codeine Itching and Rash     Labs: Recent Labs    03/07/18 1318 03/07/18 2032 03/08/18 0304 03/08/18 0836  HGB 12.0  --   --   --   HCT 38.0  --   --   --   PLT 307  --   --   --   LABPROT 22.1*  --   --  27.0*  INR 1.95  --   --  2.52  CREATININE 0.64  --   --   --   TROPONINI  --  <0.03 <0.03 <0.03    Estimated Creatinine Clearance: 58.5 mL/min (by C-G formula based on SCr of 0.64 mg/dL).   Assessment: 84yo F admitted for weakness and fatigue on chronic anticoagulation for Afib with warfarin. CBC WNL. INR slightly below goal at 1.95 on admission  INR = 2.52 today  PTA warfarin regimen: 2.5 mg daily, except 5 mg on Sun and Thurs  Goal of Therapy:  INR 2-3 Monitor platelets by anticoagulation protocol: Yes   Plan:  Warfarin 2.5 mg x1 Daily INR, monitor s/s bleeding  Consider decreasing dose of methocarbamol in 84 year old female (500 TID for now)  Thank you Anette Guarneri, PharmD [phone] 03/08/2018    1:16 PM

## 2018-03-08 NOTE — Progress Notes (Addendum)
  Echocardiogram 2D Echocardiogram has been performed with [facility].  Marybelle Killings 03/08/2018, 4:12 PM

## 2018-03-08 NOTE — Progress Notes (Addendum)
[facility] TEAM 1 - Stepdown/ICU Casey Patel  OJJ:[id number] DOB: 06/21/1930 DOA: 03/07/2018 PCP: [name], MD    Brief Narrative:  84 y.o. F w/ a hx of atrial fibrillation, HTN, Asthma, and DM who presented to the ED after a mechanical fall to her L knee on fall 8/31, and then again 9/7, at which time she tripped and landed on her buttocks. Since the most recent fall the patient reported increased generalized weakness and pain with resultant inability to ambulate (uses walker or wheelchair at baseline).   In the ED she was found to be tachycardic to 149 in Afib.   Subjective: Feeling better in general.  Denies current cp, n/v, or abdom pain.  Does have some suprapubic cramping.  R hand is less swollen but continues to hurt.    Assessment & Plan:  Chronic Afib with acute RVR follows with Dr. Martinique - warfarin per pharmacy w/ INR at goal - has failed multiple prior attempts at [facility] - rate now well controlled - Mg 1.8 - TSH normal - attempt to transition back to oral BB in AM   Mechanical falls Likely due to deconditioning + weakness/DH associated w/ UTI - PT/OT to see   UTI Send urine for culture - begin empiric abx tx and follow   Right hand pain reports similar history improved with steroids - X-ray right hand noted demineralization with extensive degenerative changes - check uric acid in AM   DM A1c 6.0 - CBG currently well controlled   HTN Well controlled at this time   HLD Cont home medical tx   MRSA screen +  Obesity - Body mass index is 33.63 kg/m.  DVT prophylaxis: warfarin  Code Status: DNR - NO CODE (confirmed to me by patient herself) Family Communication: no family present at time of exam  Disposition Plan: SDU   Consultants:  none  Antimicrobials:  none   Objective: Blood pressure 130/72, pulse 81, temperature 97.6 F (36.4 C), temperature source Oral, resp. rate 18, height 5\' 6"  (1.676 m), weight 94.5 kg, SpO2 97 %.  Intake/Output  Summary (Last 24 hours) at 03/08/2018 1528 Last data filed at 03/08/2018 9371 Gross per 24 hour  Intake 519.67 ml  Output -  Net 519.67 ml   Filed Weights   03/07/18 2146 03/08/18 0508  Weight: 94.5 kg 94.5 kg    Examination: General: No acute respiratory distress Lungs: Clear to auscultation bilaterally without wheezes or crackles Cardiovascular: irreg irreg - no appreciable M or rub  Abdomen: Nontender, nondistended, soft, bowel sounds positive, no rebound, no ascites, no appreciable mass Extremities: No significant cyanosis, clubbing, or edema bilateral lower extremities - R dorsal aspect of hand is erythematous and somewhat warm to touch, but signif less swollen than in picture added to admit H&P  CBC: Recent Labs  Lab 03/07/18 1318  WBC 10.7*  HGB 12.0  HCT 38.0  MCV 94.5  PLT 696   Basic Metabolic Panel: Recent Labs  Lab 03/07/18 1318 03/07/18 2032  NA 136  --   K 4.2  --   CL 97*  --   CO2 26  --   GLUCOSE 115*  --   BUN 12  --   CREATININE 0.64  --   CALCIUM 8.8*  --   MG  --  1.8   GFR: Estimated Creatinine Clearance: 58.5 mL/min (by C-G formula based on SCr of 0.64 mg/dL).  Liver Function Tests: No results for input(s): AST, ALT, ALKPHOS,  BILITOT, PROT, ALBUMIN in the last 168 hours. No results for input(s): LIPASE, AMYLASE in the last 168 hours. No results for input(s): AMMONIA in the last 168 hours.  Coagulation Profile: Recent Labs  Lab 03/07/18 1318 03/08/18 0836  INR 1.95 2.52    Cardiac Enzymes: Recent Labs  Lab 03/07/18 2032 03/08/18 0304 03/08/18 0836  TROPONINI <0.03 <0.03 <0.03    HbA1C: Hgb A1c MFr Bld  Date/Time Value Ref Range Status  03/07/2018 01:18 PM 6.0 (H) 4.8 - 5.6 % Final    Comment:    (NOTE) Pre diabetes:          5.7%-6.4% Diabetes:              >6.4% Glycemic control for   <7.0% adults with diabetes     CBG: Recent Labs  Lab 03/07/18 2323 03/08/18 0751 03/08/18 1119  GLUCAP 104* 86 123*     Recent Results (from the past 240 hour(s))  MRSA PCR Screening     Status: Abnormal   Collection Time: 03/07/18  9:57 PM  Result Value Ref Range Status   MRSA by PCR POSITIVE (A) NEGATIVE Final    Comment:        The GeneXpert MRSA Assay (FDA approved for NASAL specimens only), is one component of a comprehensive MRSA colonization surveillance program. It is not intended to diagnose MRSA infection nor to guide or monitor treatment for MRSA infections. RESULT CALLED TO, READ BACK BY AND VERIFIED WITH: Valora Corporal RN 0037 03/08/18 A BROWNING Performed at [facility], [place] [address], [place], [place] [address]      Scheduled Meds: . Chlorhexidine Gluconate Cloth  6 each Topical Q0600  . colchicine  0.6 mg Oral BID  . docusate sodium  100 mg Oral Daily  . feeding supplement (GLUCERNA SHAKE)  237 mL Oral TID BM  . fluticasone furoate-vilanterol  1 puff Inhalation QHS  . glimepiride  1 mg Oral Q breakfast  . insulin aspart  0-9 Units Subcutaneous TID WC  . loratadine  10 mg Oral Daily  . metFORMIN  500 mg Oral BID WC  . methocarbamol  750 mg Oral TID  . metoprolol succinate  150 mg Oral QHS  . mupirocin ointment  1 application Nasal BID  . pantoprazole  40 mg Oral Daily  . warfarin  2.5 mg Oral ONCE-1800  . Warfarin - Pharmacist Dosing Inpatient   Does not apply q1800   Continuous Infusions: . diltiazem (CARDIZEM) infusion 5 mg/hr (03/08/18 0845)     LOS: 1 day   Cherene Altes, MD [facility] Office  [phone] Pager - Text Page per Amion  If 7PM-7AM, please contact night-coverage per Amion 03/08/2018, 3:28 PM

## 2018-03-09 ENCOUNTER — Inpatient Hospital Stay (HOSPITAL_COMMUNITY): Payer: Medicare Other

## 2018-03-09 DIAGNOSIS — I4891 Unspecified atrial fibrillation: Secondary | ICD-10-CM

## 2018-03-09 DIAGNOSIS — W19XXXD Unspecified fall, subsequent encounter: Secondary | ICD-10-CM

## 2018-03-09 DIAGNOSIS — I10 Essential (primary) hypertension: Secondary | ICD-10-CM

## 2018-03-09 LAB — COMPREHENSIVE METABOLIC PANEL
ALT: 17 U/L (ref 0–44)
AST: 17 U/L (ref 15–41)
Albumin: 2.3 g/dL — ABNORMAL LOW (ref 3.5–5.0)
Alkaline Phosphatase: 60 U/L (ref 38–126)
Anion gap: 9 (ref 5–15)
BUN: 7 mg/dL — AB (ref 8–23)
CHLORIDE: 105 mmol/L (ref 98–111)
CO2: 24 mmol/L (ref 22–32)
CREATININE: 0.57 mg/dL (ref 0.44–1.00)
Calcium: 8 mg/dL — ABNORMAL LOW (ref 8.9–10.3)
GFR calc Af Amer: >60 mL/min (ref >60)
GFR calc non Af Amer: >60 mL/min (ref >60)
Glucose, Bld: 95 mg/dL (ref 70–99)
Potassium: 3.9 mmol/L (ref 3.5–5.1)
SODIUM: 138 mmol/L (ref 135–145)
Total Bilirubin: 0.6 mg/dL (ref 0.3–1.2)
Total Protein: 5.5 g/dL — ABNORMAL LOW (ref 6.5–8.1)

## 2018-03-09 LAB — CBC
HCT: 32.3 % — ABNORMAL LOW (ref 36.0–46.0)
Hemoglobin: 10.3 g/dL — ABNORMAL LOW (ref 12.0–15.0)
MCH: 29.7 pg (ref 26.0–34.0)
MCHC: 31.9 g/dL (ref 30.0–36.0)
MCV: 93.1 fL (ref 78.0–100.0)
PLATELETS: 296 10*3/uL (ref 150–400)
RBC: 3.47 MIL/uL — AB (ref 3.87–5.11)
RDW: 15.9 % — ABNORMAL HIGH (ref 11.5–15.5)
WBC: 6.3 10*3/uL (ref 4.0–10.5)

## 2018-03-09 LAB — GLUCOSE, CAPILLARY
Glucose-Capillary: 95 mg/dL (ref 70–99)
Glucose-Capillary: 121 mg/dL — ABNORMAL HIGH (ref 70–99)
Glucose-Capillary: 100 mg/dL — ABNORMAL HIGH (ref 70–99)
Glucose-Capillary: 99 mg/dL (ref 70–99)

## 2018-03-09 LAB — URIC ACID: Uric Acid, Serum: 4.7 mg/dL (ref 2.5–7.1)

## 2018-03-09 LAB — PROTIME-INR
INR: 3.13
PROTHROMBIN TIME: 31.9 s — AB (ref 11.4–15.2)

## 2018-03-09 MED ORDER — SODIUM CHLORIDE 0.9 % IV SOLN
INTRAVENOUS | Status: AC
  Administered 2018-03-09: 22:00:00 via INTRAVENOUS

## 2018-03-09 MED ORDER — DIGOXIN 0.25 MG/ML IJ SOLN
0.2500 mg | Freq: Four times a day (QID) | INTRAMUSCULAR | Status: AC
  Administered 2018-03-09 (×2): 0.25 mg via INTRAVENOUS
  Filled 2018-03-09 (×2): qty 2

## 2018-03-09 MED ORDER — WARFARIN SODIUM 1 MG PO TABS
1.0000 mg | ORAL_TABLET | Freq: Once | ORAL | Status: AC
  Administered 2018-03-09: 1 mg via ORAL
  Filled 2018-03-09: qty 1

## 2018-03-09 NOTE — Evaluation (Signed)
Physical Therapy Evaluation Patient Details Name: Casey Patel MRN: [id number] DOB: 1929-08-05 Today's Date: 03/09/2018   History of Present Illness  Pt is an 84 y.o. female admitted 03/07/18 with recent falls onto L knee (8/31-negative for fx) and R wrist (9/7) c/o increased generalized weakness. Pt with UTI, chronic a-fib and acute RVR. R wrist xray shows osteopenia, negative for displaced fx. PMH includes HTN, a-fib, DM, asthma.    Clinical Impression  Pt presents with an overall decrease in functional mobility secondary to above. PTA, pt lives at [place] with family; walks with RW or uses transport chair, mostly indep with ADLs. Today, pt required maxA+2 to amb short distance with single UE support. Limited by L knee pain and instability when WB, and R wrist pain. HR up to 136. Pt would benefit from continued acute PT services to maximize functional mobility and independence prior to d/c with SNF-level therapies.     Follow Up Recommendations SNF;Supervision for mobility/OOB    Equipment Recommendations  None recommended by PT    Recommendations for Other Services       Precautions / Restrictions Precautions Precautions: Fall Required Braces or Orthoses: Other Brace/Splint Other Brace/Splint: R wrist splint      Mobility  Bed Mobility Overal bed mobility: Needs Assistance Bed Mobility: Supine to Sit     Supine to sit: HOB elevated;Min assist     General bed mobility comments: MinA for UE support; pt scoots hips to EOB well. Once seated, able to use BUE to scoot back in recliner  Transfers Overall transfer level: Needs assistance Equipment used: 1 person hand held assist Transfers: Sit to/from [facility] Sit to Stand: Mod assist Stand pivot transfers: Max assist;+2 physical assistance       General transfer comment: Reliant on LUE support to stand, modA for trunk elevation. L knee buckling. Pt unable to achieve fully upright posture. Poor eccentric control  with sitting  Ambulation/Gait Ambulation/Gait assistance: Max assist;+2 physical assistance Gait Distance (Feet): 5 Feet Assistive device: 1 person hand held assist Gait Pattern/deviations: Step-to pattern;Decreased weight shift to left Gait velocity: Decreased Gait velocity interpretation: <1.31 ft/sec, indicative of household ambulator General Gait Details: Took steps to recliner requiring maxA+2 to prevent fall secondary to L knee buckling  Stairs            Wheelchair Mobility    Modified Rankin (Stroke Patients Only)       Balance Overall balance assessment: Needs assistance   Sitting balance-Leahy Scale: Fair       Standing balance-Leahy Scale: Poor Standing balance comment: Reliant on LUE support; dependent for pericare while standing                             Pertinent Vitals/Pain Pain Assessment: Faces Faces Pain Scale: Hurts little more Pain Location: R wrist Pain Descriptors / Indicators: Sore;Guarding Pain Intervention(s): Monitored during session;Other (comment)(wrist applied by OT)    Home Living Family/patient expects to be discharged to:: Private residence(Independent living) Living Arrangements: Other relatives(sister, brother in law) Available Help at Discharge: Family;Available 24 hours/day Type of Home: House Home Access: Stairs to enter Entrance Stairs-Rails: None Entrance Stairs-Number of Steps: 1 Home Layout: One level Home Equipment: Grab bars - toilet;Grab bars - tub/shower;Hand held shower head;Shower seat;Walker - 2 wheels;Transport chair;Wheelchair - manual Additional Comments: sister brother in Sports coach and grandson help with transportation. Cat (Colter) in the home     Prior Function Level of Independence:  Needs assistance   Gait / Transfers Assistance Needed: walks with RW and uses transport chair in the home pulling with feet   ADL's / Homemaking Assistance Needed: reports sister help with bathing  Comments: reports  x2 falls in the home.      Hand Dominance   Dominant Hand: Left    Extremity/Trunk Assessment   Upper Extremity Assessment Upper Extremity Assessment: RUE deficits/detail RUE Deficits / Details: R wrist pain and swelling since fall    Lower Extremity Assessment Lower Extremity Assessment: Generalized weakness;LLE deficits/detail LLE Deficits / Details: c/o L knee pain since fall; L knee instability requiring maxA to prevent buckling    Cervical / Trunk Assessment Cervical / Trunk Assessment: Kyphotic(significant cervical flexion)  Communication   Communication: No difficulties  Cognition Arousal/Alertness: Awake/alert Behavior During Therapy: WFL for tasks assessed/performed Overall Cognitive Status: Within Functional Limits for tasks assessed                                        General Comments General comments (skin integrity, edema, etc.): SpO2 down to 86% on RA, returning to >90% with seated rest. HR up to 136    Exercises     Assessment/Plan    PT Assessment Patient needs continued PT services  PT Problem List Decreased strength;Decreased range of motion;Decreased activity tolerance;Decreased balance;Decreased mobility;Decreased knowledge of use of DME;Pain       PT Treatment Interventions DME instruction;Gait training;Stair training;Functional mobility training;Therapeutic activities;Therapeutic exercise;Balance training;Patient/family education;Wheelchair mobility training    PT Goals (Current goals can be found in the Care Plan section)  Acute Rehab PT Goals Patient Stated Goal: Return home with home health services PT Goal Formulation: With patient Time For Goal Achievement: 03/23/18 Potential to Achieve Goals: Fair    Frequency Min 2X/week   Barriers to discharge        Co-evaluation PT/OT/SLP Co-Evaluation/Treatment: Yes Reason for Co-Treatment: For patient/therapist safety;To address functional/ADL transfers PT goals addressed  during session: Mobility/safety with mobility;Balance         AM-PAC PT "6 Clicks" Daily Activity  Outcome Measure Difficulty turning over in bed (including adjusting bedclothes, sheets and blankets)?: A Little Difficulty moving from lying on back to sitting on the side of the bed? : Unable Difficulty sitting down on and standing up from a chair with arms (e.g., wheelchair, bedside commode, etc,.)?: Unable Help needed moving to and from a bed to chair (including a wheelchair)?: A Lot Help needed walking in hospital room?: A Lot Help needed climbing 3-5 steps with a railing? : Total 6 Click Score: 10    End of Session Equipment Utilized During Treatment: Gait belt Activity Tolerance: Patient tolerated treatment well Patient left: in chair;with call bell/phone within reach;with chair alarm set Nurse Communication: Mobility status PT Visit Diagnosis: Other abnormalities of gait and mobility (R26.89);Muscle weakness (generalized) (M62.81)    Time: 1340-1409 PT Time Calculation (min) (ACUTE ONLY): 29 min   Charges:   PT Evaluation $PT Eval Moderate Complexity: [place], PT, DPT Acute Rehabilitation Services  Pager [phone] Office [phone]  Derry Lory 03/09/2018, 2:33 PM

## 2018-03-09 NOTE — Consult Note (Signed)
Cardiology Consultation:   Patient ID: Casey Patel MRN: [id number]; DOB: June 25, 1930  Admit date: 03/07/2018 Date of Consult: 03/09/2018  Primary Care Provider: [name], MD Primary Cardiologist: Dr Martinique   Patient Profile:   Casey Patel is a 84 y.o. female with a hx of chronic A-fib who is being seen today for the evaluation of a-fib with RVR at the request of Dr Joette Catching.  History of Present Illness:   Casey Patel is an 84 year old female with history of chronic atrial fibrillation, status post multiple failed cardioversions, managed with rate control and anticoagulation with Toprol and Coumadin, followed by Dr. Martinique in the clinic, last seen in February 2019 when she was doing well. She also has history of hypertension asthma and diabetes.  She presented to the ER on March 07, 2018 after mechanical fall on 8/31, and then again 9/7, at which time she tripped and landed on her buttocks. Since the most recent fall the patient reported increased generalized weakness and pain with resultant inability to ambulate (uses walker or wheelchair at baseline).In the ED she was found to be tachycardic to 149 in Afib.   The patient denies any shortness of breath or chest pain dizziness or palpitations.   Past Medical History:  Diagnosis Date  . AF (atrial fibrillation) ([place])   . Arthritis   . Asthma   . Diabetes mellitus    TYPE 2  . Hyperlipidemia   . Hypertension   . PNA (pneumonia)     Past Surgical History:  Procedure Laterality Date  . ABDOMINAL HYSTERECTOMY  1976  . BACK SURGERY     lumb x 3  . CARDIOVERSION  12/07/2011   Procedure: CARDIOVERSION;  Surgeon: Peter M Martinique, MD;  Location: [facility];  Service: Cardiovascular;  Laterality: N/A;  . CARPAL TUNNEL RELEASE  09/15/2011   Procedure: CARPAL TUNNEL RELEASE;  Surgeon: Wynonia Sours, MD;  Location: [facility];  Service: Orthopedics;  Laterality: Right;  . CATARACT EXTRACTION    . CHOLECYSTECTOMY   2003  . HAND TENDON SURGERY  1999   left wrist - thumb  . LAMINECTOMY  1999   with fusion (L1-5)  . LAMINECTOMY  [id number]   with fusion (L3-4)  . TONSILLECTOMY  1952    Inpatient Medications: Scheduled Meds: . Chlorhexidine Gluconate Cloth  6 each Topical Q0600  . colchicine  0.6 mg Oral BID  . docusate sodium  100 mg Oral Daily  . feeding supplement (GLUCERNA SHAKE)  237 mL Oral TID BM  . fluticasone furoate-vilanterol  1 puff Inhalation QHS  . glimepiride  1 mg Oral Q breakfast  . insulin aspart  0-9 Units Subcutaneous TID WC  . loratadine  10 mg Oral Daily  . metFORMIN  500 mg Oral BID WC  . methocarbamol  750 mg Oral TID  . metoprolol succinate  150 mg Oral QHS  . mupirocin ointment  1 application Nasal BID  . pantoprazole  40 mg Oral Daily  . Warfarin - Pharmacist Dosing Inpatient   Does not apply q1800   Continuous Infusions: . cefTRIAXone (ROCEPHIN)  IV 1 g (03/09/18 1731)   PRN Meds: acetaminophen **OR** [DISCONTINUED] acetaminophen, [DISCONTINUED] ondansetron **OR** ondansetron (ZOFRAN) IV, traMADol  Allergies:    Allergies  Allergen Reactions  . Wheat Bran Shortness Of Breath  . Adhesive [Tape] Other (See Comments)    Blisters on skin  . Kiwi Extract Other (See Comments)    Causes mouth sores  . Orange Fruit [Citrus]  Other (See Comments)    Causes mouth sores  . Pineapple Other (See Comments)    Causes mouth sores  . Promethazine Hcl Other (See Comments)    Disorientation   . Tomato Other (See Comments)    Causes mouth sores  . Tylenol [Acetaminophen] Other (See Comments)    Causes fast heart rate  . Vistaril [Hydroxyzine Hcl] Other (See Comments)    confusion  . Codeine Itching and Rash    Social History:   Social History   Socioeconomic History  . Marital status: Widowed    Spouse name: Not on file  . Number of children: 1  . Years of education: Not on file  . Highest education level: Not on file  Occupational History  . 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 Financial resource strain: Not on file  . Food insecurity:    Worry: Not on file    Inability: Not on file  . Transportation needs:    Medical: Not on file    Non-medical: Not on file  Tobacco Use  . Smoking status: Former Smoker    Packs/day: 0.80    Years: 20.00    Pack years: 16.00    Types: Cigarettes    Last attempt to quit: 06/29/1978    Years since quitting: 39.7  . Smokeless tobacco: Never Used  Substance and Sexual Activity  . Alcohol use: No  . Drug use: No  . Sexual activity: Not on file  Lifestyle  . Physical activity:    Days per week: Not on file    Minutes per session: Not on file  . Stress: Not on file  Relationships  . Social connections:    Talks on phone: Not on file    Gets together: Not on file    Attends religious service: Not on file    Active member of club or organization: Not on file    Attends meetings of clubs or organizations: Not on file    Relationship status: Not on file  . Intimate partner violence:    Fear of current or ex partner: Not on file    Emotionally abused: Not on file    Physically abused: Not on file    Forced sexual activity: Not on file  Other Topics Concern  . Not on file  Social History Narrative  . Not on file    Family History:    Family History  Problem Relation Age of Onset  . Hypertension Mother 38  . Stroke Mother 4  . Arthritis Mother 3  . Heart attack Father 83  . Heart disease Father 30  . Hypertension Father 55  . Ulcers Father 66  . Breast cancer Sister   . Heart disease Brother        CONGENTIAL HEART DISEASE  . Heart disease Sister   . Diabetes Sister   . Breast cancer Sister      ROS:  Please see the history of present illness.  All other ROS reviewed and negative.     Physical Exam/Data:   Vitals:   03/09/18 0003 03/09/18 0523 03/09/18 0823 03/09/18 1148  BP: (!) 92/59 119/64 120/69 131/72  Pulse: 75 79 83 (!) 115  Resp: 16 15 (!) 21 20  Temp:   98.1 F (36.7 C) 97.9 F (36.6 C) 97.9 F (36.6 C)  TempSrc:  Oral Oral Oral  SpO2: 91% 95% 94% 94%  Weight:  94.8 kg    Height:  Intake/Output Summary (Last 24 hours) at 03/09/2018 1822 Last data filed at 03/09/2018 1000 Gross per 24 hour  Intake 620 ml  Output 1100 ml  Net -480 ml   Filed Weights   03/07/18 2146 03/08/18 0508 03/09/18 0523  Weight: 94.5 kg 94.5 kg 94.8 kg   Body mass index is 33.73 kg/m.  General:  Well nourished, well developed, in no acute distress HEENT: normal Lymph: no adenopathy Neck: no JVD Endocrine:  No thryomegaly Vascular: No carotid bruits; FA pulses 2+ bilaterally without bruits  Cardiac:  normal S1, S2; iRRR; no murmur  Lungs:  clear to auscultation bilaterally, no wheezing, rhonchi or rales  Abd: soft, nontender, no hepatomegaly  Ext: no edema Musculoskeletal:  No deformities, BUE and BLE strength normal and equal Skin: warm and dry  Neuro:  CNs 2-12 intact, no focal abnormalities noted Psych:  Normal affect   EKG:  The EKG was personally reviewed and demonstrates: Atrial fibrillation with rapid ventricular response 128 bpm, incomplete right bundle branch block and nonspecific ST-T wave abnormalities. Telemetry:  Telemetry was personally reviewed and demonstrates: Atrial fibrillation with rapid ventricular response and ventricular rates around 120 bpm  Relevant CV Studies:  TTE: 03/08/2018 - Left ventricle: The cavity size was normal. Wall thickness was   normal. Systolic function was normal. The estimated ejection   fraction was in the range of 55% to 60%. Wall motion was normal;   there were no regional wall motion abnormalities. - Mitral valve: There was mild regurgitation. - Left atrium: The atrium was moderately dilated. - Right ventricle: Systolic function was mildly to moderately   reduced. - Right atrium: The atrium was moderately dilated. - Tricuspid valve: There was mild-moderate regurgitation directed    centrally. - Pulmonary arteries: PA peak pressure: 55 mm Hg (S).  Laboratory Data:  Chemistry Recent Labs  Lab 03/07/18 1318 03/09/18 0423  NA 136 138  K 4.2 3.9  CL 97* 105  CO2 26 24  GLUCOSE 115* 95  BUN 12 7*  CREATININE 0.64 0.57  CALCIUM 8.8* 8.0*  GFRNONAA >60 >60  GFRAA >60 >60  ANIONGAP 13 9    Recent Labs  Lab 03/09/18 0423  PROT 5.5*  ALBUMIN 2.3*  AST 17  ALT 17  ALKPHOS 60  BILITOT 0.6   Hematology Recent Labs  Lab 03/07/18 1318 03/09/18 0423  WBC 10.7* 6.3  RBC 4.02 3.47*  HGB 12.0 10.3*  HCT 38.0 32.3*  MCV 94.5 93.1  MCH 29.9 29.7  MCHC 31.6 31.9  RDW 16.0* 15.9*  PLT 307 296   Cardiac Enzymes Recent Labs  Lab 03/07/18 2032 03/08/18 0304 03/08/18 0836  TROPONINI <0.03 <0.03 <0.03    Recent Labs  Lab 03/07/18 1335  TROPIPOC 0.02    BNPNo results for input(s): BNP, PROBNP in the last 168 hours.  DDimer No results for input(s): DDIMER in the last 168 hours.  Radiology/Studies:  Dg Chest 2 View  Result Date: 03/07/2018 CLINICAL DATA:  Weakness. EXAM: CHEST - 2 VIEW COMPARISON:  None. FINDINGS: Mild cardiomegaly is noted. No pneumothorax or pleural effusion is noted. Calcified granuloma is noted in right midlung. No consolidative process is noted. The visualized skeletal structures are unremarkable. IMPRESSION: No active cardiopulmonary disease. Electronically Signed   By: Marijo Conception, M.D.   On: 03/07/2018 14:42   Dg Pelvis 1-2 Views  Result Date: 03/07/2018 CLINICAL DATA:  Multiple falls, difficulty ambulating EXAM: PELVIS - 1-2 VIEW COMPARISON:  None FINDINGS: Osseous demineralization. Prior lumbar  fusion. Hip and SI joint spaces preserved. No acute fracture, dislocation, or bone destruction. IMPRESSION: No acute abnormalities. Electronically Signed   By: Lavonia Dana M.D.   On: 03/07/2018 19:58   Dg Wrist Complete Right  Result Date: 03/09/2018 CLINICAL DATA:  Ongoing right wrist pain and swelling. History of gout and  arthritis. EXAM: RIGHT WRIST - COMPLETE 3+ VIEW COMPARISON:  Right hand radiographs - 03/07/2018 FINDINGS: Osteopenia. No acute fracture or dislocation. Severe degenerative change involving the STT joints of the base of thumb with joint space loss, subchondral sclerosis and osteophytosis. Several tiny ossicles are noted about the base of the thumb without discrete donor sites. Note is made of mild widening of the scapholunate articulation with proximal migration of the capitate and associated degenerative change. No evidence of chondrocalcinosis. No displacement of the pronator quadratus fat pad. Distal vascular calcifications. IMPRESSION: 1. Osteopenia without displaced fracture. 2. Severe degenerative change involving the base of the thumb. 3. Mild widening of the scapholunate articulation with proximal migration of the capitate suggestive of previous ligamentous injury. Electronically Signed   By: Sandi Mariscal M.D.   On: 03/09/2018 12:59   Dg Hand 2 View Right  Result Date: 03/07/2018 CLINICAL DATA:  RIGHT hand pain, arthritis, no injury EXAM: RIGHT HAND - 2 VIEW COMPARISON:  None FINDINGS: Osseous demineralization. Scattered narrowing of IP joints. Advanced degenerative changes at first [facility] joint and at distal pole of scaphoid. Additional midcarpal and radiocarpal degenerative changes with cystic change at the triquetrum. No acute fracture, dislocation, or additional bone destruction. Minimal narrowing of the first and fifth MCP joints. Dorsal soft tissue swelling overlying the carpus. IMPRESSION: Osseous demineralization with extensive degenerative changes as above. Electronically Signed   By: Lavonia Dana M.D.   On: 03/07/2018 20:02    Assessment and Plan:   1. Atrial fibrillation, permanent. Rate is uncontrolled in the settings of UTI and hypovolemia, however rates have improved since the admission now around 105 bpm, I would start hydration with 75 cc of normal saline per hour.  I would continue  Toprol-XL 150 mg daily, for now I would not increase beta-blocker at any other agent given sepsis and hypotension.  Continue warfarin.  2.  Hypertension -low blood pressure in the settings of sepsis.  For questions or updates, please contact [place] Please consult [email or web address] for contact info under   Signed, Ena Dawley, MD  03/09/2018 6:22 PM

## 2018-03-09 NOTE — Clinical Social Work Note (Signed)
Clinical Social Work Assessment  Patient Details  Name: Casey Patel MRN: [id number] Date of Birth: Nov 01, 1929  Date of referral:  03/09/18               Reason for consult:  Facility Placement, Discharge Planning                Permission sought to share information with:  Facility Sport and exercise psychologist, Family Supports Permission granted to share information::  Yes, Verbal Permission Granted  Name::     Janalyn Shy  Agency::  SNFs  Relationship::  sister  Contact Information:  [phone]  Housing/Transportation Living arrangements for the past 2 months:  Single Family Home Source of Information:  Patient Patient Interpreter Needed:  None Criminal Activity/Legal Involvement Pertinent to Current Situation/Hospitalization:  No - Comment as needed Significant Relationships:  Siblings, Other Family Members Lives with:  Siblings Do you feel safe going back to the place where you live?  Yes Need for family participation in patient care:  Yes (Comment)  Care giving concerns: Patient from home with family. PT recommending SNF.   Social Worker assessment / plan: CSW met with patient, patient's sister, and patient's grandson at bedside. Patient alert and oriented, sitting up in bedside chair. CSW introduced self and role and discussed recommendation for SNF. Patient and family had questions about options for home health, but are understanding of SNF recommendation and agreeable to short term SNF.   Patient lives with her sister who provides care and support, and grandson also assists with patient's care. Patient's family concerned about a SNF providing appropriate diet for patient due to her diabetes and food allergies.  CSW sent out initial referrals, awaiting bed offers. CSW will provide bed offers to patient and family when available and will follow to support with discharge planning.  Employment status:  Retired Forensic scientist:  Medicare PT Recommendations:  [place] / Referral to community resources:  [place]  Patient/Family's Response to care: Patient and family appreciative of care.  Patient/Family's Understanding of and Emotional Response to Diagnosis, Current Treatment, and Prognosis: Patient and family with understanding of patient's condition and agreeable to SNF.  Emotional Assessment Appearance:  Appears stated age Attitude/Demeanor/Rapport:  Engaged Affect (typically observed):  Accepting, Calm, Appropriate, Pleasant Orientation:  Oriented to Self, Oriented to Place, Oriented to  Time, Oriented to Situation Alcohol / Substance use:  Not Applicable Psych involvement (Current and /or in the community):  No (Comment)  Discharge Needs  Concerns to be addressed:  Discharge Planning Concerns, Care Coordination Readmission within the last 30 days:  No Current discharge risk:  Physical Impairment Barriers to Discharge:  Continued Medical Work up   Estanislado Emms, LCSW 03/09/2018, 4:42 PM

## 2018-03-09 NOTE — Discharge Instructions (Signed)

## 2018-03-09 NOTE — Evaluation (Signed)
Occupational Therapy Evaluation Patient Details Name: Casey Patel MRN: [id number] DOB: 05-09-1930 Today's Date: 03/09/2018    History of Present Illness Pt is an 84 y.o. female admitted 03/07/18 with recent falls onto L knee (8/31-negative for fx) and R wrist (9/7) c/o increased generalized weakness. Pt with UTI, chronic a-fib and acute RVR. R wrist xray shows osteopenia, negative for displaced fx. PMH includes HTN, a-fib, DM, asthma.   Clinical Impression   PT admitted with s/p fall with RVR with AFIB and R wrist fx. Pt currently with functional limitiations due to the deficits listed below (see OT problem list). Pt currently with R wrist fx with splint from ortho tech. Need weight bearing for R wrist. Pt will be provided RW with platform for elbow wbat at this time. Recommend SNF due to decr activity tolerance and incr fall risk. Pt will benefit from skilled OT to increase their independence and safety with adls and balance to allow discharge SNF. Pt states "I want to do therapy at home if possible."     Follow Up Recommendations  SNF    Equipment Recommendations  Other (comment)(SNF to provided)    Recommendations for Other Services       Precautions / Restrictions Precautions Precautions: Fall Required Braces or Orthoses: Other Brace/Splint Other Brace/Splint: R wrist splint      Mobility Bed Mobility Overal bed mobility: Needs Assistance Bed Mobility: Supine to Sit     Supine to sit: HOB elevated;Min assist     General bed mobility comments: MinA for UE support; pt scoots hips to EOB well. Once seated, able to use BUE to scoot back in recliner  Transfers Overall transfer level: Needs assistance Equipment used: 1 person hand held assist Transfers: Sit to/from [facility] Sit to Stand: Mod assist Stand pivot transfers: Max assist;+2 physical assistance       General transfer comment: Reliant on LUE support to stand, modA for trunk elevation. L knee  buckling. Pt unable to achieve fully upright posture. Poor eccentric control with sitting    Balance Overall balance assessment: Needs assistance   Sitting balance-Leahy Scale: Fair       Standing balance-Leahy Scale: Poor Standing balance comment: Reliant on LUE support; dependent for pericare while standing                           ADL either performed or assessed with clinical judgement   ADL Overall ADL's : Needs assistance/impaired Eating/Feeding: Set up;Sitting   Grooming: Set up;Sitting   Upper Body Bathing: Moderate assistance   Lower Body Bathing: Maximal assistance   Upper Body Dressing : Moderate assistance   Lower Body Dressing: Maximal assistance   Toilet Transfer: Maximal assistance;+2 for physical assistance Toilet Transfer Details (indicate cue type and reason): pt able to power up mod (A) but requires (A) to pivot to [facility] Toileting- Clothing Manipulation and Hygiene: Total assistance Toileting - Clothing Manipulation Details (indicate cue type and reason): pt able to static stand with total (A) pt requires L UE hold on       General ADL Comments: pt requires (A) to transfer ~32ft with decreased control to the chair. pt falling into the chair and requries total +2 max (A) to transfer safely.      Vision Baseline Vision/History: Wears glasses Wears Glasses: At all times       Perception     Praxis      Pertinent Vitals/Pain Pain Assessment: Faces Faces  Pain Scale: Hurts little more Pain Location: R wrist Pain Descriptors / Indicators: Sore;Guarding Pain Intervention(s): Monitored during session;Premedicated before session;Repositioned     Hand Dominance Left   Extremity/Trunk Assessment Upper Extremity Assessment Upper Extremity Assessment: RUE deficits/detail RUE Deficits / Details: R wrist pain and swelling since fall   Lower Extremity Assessment Lower Extremity Assessment: Generalized weakness;LLE deficits/detail LLE Deficits  / Details: c/o L knee pain since fall; L knee instability requiring maxA to prevent buckling   Cervical / Trunk Assessment Cervical / Trunk Assessment: Kyphotic(significant cervical flexion)   Communication Communication Communication: No difficulties   Cognition Arousal/Alertness: Awake/alert Behavior During Therapy: WFL for tasks assessed/performed Overall Cognitive Status: Within Functional Limits for tasks assessed                                     General Comments  HR 136 with decr oxygen saturations. pt with urgency to void bladder on arrival and unable to control     Exercises     Shoulder Instructions      Home Living Family/patient expects to be discharged to:: Private residence(Independent living) Living Arrangements: Other relatives(sister, brother in law) Available Help at Discharge: Family;Available 24 hours/day Type of Home: House Home Access: Stairs to enter [employer] of Steps: 1 Entrance Stairs-Rails: None Home Layout: One level     Bathroom Shower/Tub: Occupational psychologist: Standard Bathroom Accessibility: Yes How Accessible: Accessible via walker Home Equipment: Grab bars - toilet;Grab bars - tub/shower;Hand held shower head;Shower seat;Walker - 2 wheels;Transport chair;Wheelchair - manual   Additional Comments: sister brother in Sports coach and grandson help with transportation. [employer]/gallery exhibitions officer) in the home       Prior Functioning/Environment Level of Independence: Needs assistance  Gait / Transfers Assistance Needed: walks with RW and uses transport chair in the home pulling with feet  ADL's / Homemaking Assistance Needed: reports sister help with bathing   Comments: reports x2 falls in the home.         OT Problem List: Decreased strength;Decreased range of motion;Decreased activity tolerance;Impaired balance (sitting and/or standing);Decreased safety awareness;Decreased knowledge of use of DME or AE;Decreased  knowledge of precautions;Obesity;Impaired UE functional use;Pain;Cardiopulmonary status limiting activity      OT Treatment/Interventions: Self-care/ADL training;Therapeutic exercise;Neuromuscular education;Energy conservation;DME and/or AE instruction;Manual therapy;Modalities;Therapeutic activities;Splinting;Balance training;Patient/family education    OT Goals(Current goals can be found in the care plan section) Acute Rehab OT Goals Patient Stated Goal: Return home with home health services OT Goal Formulation: With patient Time For Goal Achievement: 03/23/18 Potential to Achieve Goals: Good  OT Frequency: Min 2X/week   Barriers to D/C: Inaccessible home environment;Decreased caregiver support          Co-evaluation PT/OT/SLP Co-Evaluation/Treatment: Yes Reason for Co-Treatment: Complexity of the patient's impairments (multi-system involvement);Necessary to address cognition/behavior during functional activity;For patient/therapist safety;To address functional/ADL transfers PT goals addressed during session: Mobility/safety with mobility;Balance OT goals addressed during session: ADL's and self-care;Proper use of Adaptive equipment and DME;Strengthening/ROM      AM-PAC PT "6 Clicks" Daily Activity     Outcome Measure Help from another person eating meals?: A Little Help from another person taking care of personal grooming?: A Lot Help from another person toileting, which includes using toliet, bedpan, or urinal?: A Lot Help from another person bathing (including washing, rinsing, drying)?: A Lot Help from another person to put on and taking off regular upper body clothing?: A  Lot Help from another person to put on and taking off regular lower body clothing?: Total 6 Click Score: 12   End of Session Equipment Utilized During Treatment: Gait belt Nurse Communication: Mobility status;Precautions  Activity Tolerance: Patient tolerated treatment well Patient left: in chair;with  call bell/phone within reach;with chair alarm set  OT Visit Diagnosis: Unsteadiness on feet (R26.81);Muscle weakness (generalized) (M62.81)                Time: 1340-1407 OT Time Calculation (min): 27 min Charges:  OT General Charges $OT Visit: 1 Visit OT Evaluation $OT Eval Moderate Complexity: 1 Mod   Jeri Modena, OTR/L  Acute Rehabilitation Services Pager: [phone] Office: [phone] .   Parke Poisson B 03/09/2018, 3:51 PM

## 2018-03-09 NOTE — NC FL2 (Addendum)
[place] MEDICAID FL2 LEVEL OF CARE SCREENING TOOL     IDENTIFICATION  Patient Name: Casey Patel Birthdate: 06-Feb-1930 Sex: female Admission Date (Current Location): 03/07/2018  [facility] and [place] Number:  Herbalist and Address:  [facility] [place], [place] [address], [place], [place] [address]      Provider Number: [id number]  Attending Physician Name and Address:  Thurnell Lose, MD  Relative Name and Phone Number:  Janalyn Shy, sister, [phone]    Current Level of Care: Hospital Recommended Level of Care: [place] Prior Approval Number:    Date Approved/Denied:   PASRR Number: [id number]  Discharge Plan: SNF    Current Diagnoses: Patient Active Problem List   Diagnosis Date Noted  . Pressure injury of skin 03/08/2018  . Atrial fibrillation ([place]) 03/07/2018  . Cough 01/01/2012  . Pneumonia 12/03/2011  . Arthritis   . Hypertension   . Hyperlipidemia   . Asthma   . DM (diabetes mellitus) ([place])     Orientation RESPIRATION BLADDER Height & Weight     Self, Time, Situation, Place  Normal (2L O2 at night) Continent, External catheter Weight: 208 lb 15.9 oz (94.8 kg) Height:  5\' 6"  (167.6 cm)  BEHAVIORAL SYMPTOMS/MOOD NEUROLOGICAL BOWEL NUTRITION STATUS      Incontinent Diet(please see DC summary)  AMBULATORY STATUS COMMUNICATION OF NEEDS Skin   Extensive Assist Verbally PU Stage and Appropriate Care(PU stage II buttocks medial; PU stage I buttocks mid)                       Personal Care Assistance Level of Assistance  Bathing, Feeding, Dressing Bathing Assistance: Maximum assistance Feeding assistance: Limited assistance Dressing Assistance: Maximum assistance     Functional Limitations Info  Sight, Hearing, Speech Sight Info: Adequate Hearing Info: Adequate Speech Info: Adequate    SPECIAL CARE FACTORS FREQUENCY  PT (By licensed PT), OT (By licensed OT)     PT Frequency: 5x/week OT Frequency:  5x/week            Contractures Contractures Info: Not present    Additional Factors Info  Code Status, Allergies, Insulin Sliding Scale, Isolation Precautions Code Status Info: DNR Allergies Info: Wheat Bran, Adhesive Tape, Kiwi Extract, Orange Fruit Citrus, Pineapple, Promethazine Hcl, Tomato, Tylenol Acetaminophen, Vistaril Hydroxyzine Hcl, Codeine   Insulin Sliding Scale Info: novolog 3x/day with meals Isolation Precautions Info: contact precautions, MRSA     Current Medications (03/09/2018):  This is the current hospital active medication list Current Facility-Administered Medications  Medication Dose Route Frequency Provider Last Rate Last Dose  . acetaminophen (TYLENOL) tablet 650 mg  650 mg Oral Q6H PRN Emokpae, Ejiroghene E, MD      . cefTRIAXone (ROCEPHIN) 1 g in sodium chloride 0.9 % 100 mL IVPB  1 g Intravenous Q24H Joette Catching T, MD 200 mL/hr at 03/08/18 1750 1 g at 03/08/18 1750  . Chlorhexidine Gluconate Cloth 2 % PADS 6 each  6 each Topical Q0600 Emokpae, Ejiroghene E, MD   6 each at 03/09/18 0610  . colchicine tablet 0.6 mg  0.6 mg Oral BID Emokpae, Ejiroghene E, MD   0.6 mg at 03/09/18 0915  . digoxin (LANOXIN) 0.25 MG/ML injection 0.25 mg  0.25 mg Intravenous Q6H Thurnell Lose, MD   0.25 mg at 03/09/18 1217  . docusate sodium (COLACE) capsule 100 mg  100 mg Oral Daily Emokpae, Ejiroghene E, MD   100 mg at 03/09/18 0915  .  feeding supplement (GLUCERNA SHAKE) (GLUCERNA SHAKE) liquid 237 mL  237 mL Oral TID BM Cherene Altes, MD   237 mL at 03/09/18 0916  . fluticasone furoate-vilanterol (BREO ELLIPTA) 100-25 MCG/INH 1 puff  1 puff Inhalation QHS Cherene Altes, MD   1 puff at 03/08/18 2012  . glimepiride (AMARYL) tablet 1 mg  1 mg Oral Q breakfast Emokpae, Ejiroghene E, MD   1 mg at 03/09/18 0915  . insulin aspart (novoLOG) injection 0-9 Units  0-9 Units Subcutaneous TID WC Emokpae, Ejiroghene E, MD   1 Units at 03/09/18 1213  . loratadine (CLARITIN)  tablet 10 mg  10 mg Oral Daily Emokpae, Ejiroghene E, MD   10 mg at 03/09/18 0915  . metFORMIN (GLUCOPHAGE) tablet 500 mg  500 mg Oral BID WC Emokpae, Ejiroghene E, MD   500 mg at 03/09/18 0915  . methocarbamol (ROBAXIN) tablet 750 mg  750 mg Oral TID Emokpae, Ejiroghene E, MD   750 mg at 03/09/18 0915  . metoprolol succinate (TOPROL-XL) 24 hr tablet 150 mg  150 mg Oral QHS Emokpae, Ejiroghene E, MD   150 mg at 03/08/18 2219  . mupirocin ointment (BACTROBAN) 2 % 1 application  1 application Nasal BID Emokpae, Ejiroghene E, MD   1 application at 37/44/51 0917  . ondansetron (ZOFRAN) injection 4 mg  4 mg Intravenous Q6H PRN Emokpae, Ejiroghene E, MD      . pantoprazole (PROTONIX) EC tablet 40 mg  40 mg Oral Daily Emokpae, Ejiroghene E, MD   40 mg at 03/09/18 0915  . traMADol (ULTRAM) tablet 50 mg  50 mg Oral Q6H PRN Vertis Kelch, NP   50 mg at 03/08/18 2219  . warfarin (COUMADIN) tablet 1 mg  1 mg Oral ONCE-1800 Tyrone Apple, RPH      . Warfarin - Pharmacist Dosing Inpatient   Does not apply q1800 Emokpae, Ejiroghene E, MD         Discharge Medications: Please see discharge summary for a list of discharge medications.  Relevant Imaging Results:  Relevant Lab Results:   Additional Information SSN: [id number]  Estanislado Emms, LCSW

## 2018-03-09 NOTE — Progress Notes (Addendum)
Casey Patel - Stepdown/ICU Casey Patel  YDX:[id number] DOB: 03-09-1930 DOA: 03/07/2018 PCP: [name], MD    Brief Narrative:  84 y.o. F w/ a hx of atrial fibrillation, HTN, Asthma, and DM who presented to the ED after a mechanical fall to her L knee on fall 8/31, and then again 9/7, at which time she tripped and landed on her buttocks. Since the most recent fall the patient reported increased generalized weakness and pain with resultant inability to ambulate (uses walker or wheelchair at baseline).   In the ED she was found to be tachycardic to 149 in Afib.   Subjective:  Patient in bed, appears comfortable, denies any headache, no fever, no chest pain or pressure, no shortness of breath , no abdominal pain. No focal weakness.    Assessment & Plan:  Chronic Afib with acute RVR -Mali vas 2 score of at least 3.  Patient under the care of EP physician [name], on high-dose beta-blocker rate still between 110 - 120, will give 2 loading doses of IV digoxin, continue Coumadin as it stable, echo reviewed with preserved EF.  Cardiology requested to opine.  Mechanical falls -  Likely due to deconditioning + weakness/DH associated w/ UTI - PT/OT to see, may require placement.  UTI - Send urine for culture - begin empiric abx tx and follow   Right hand pain - reports similar history improved with steroids - X-ray right hand noted demineralization with extensive degenerative changes -we will uric acid may benefit from outpatient orthopedic/rheumatology follow-up.  Will check x-ray right wrist and place her on a splint for now.  DM2 - A1c 6.0 - CBG currently well controlled , currently on combination of Glucophage and glipizide.  CBG (last 3)  Recent Labs    03/08/18 2134 03/09/18 0757 03/09/18 1143  GLUCAP 95 95 121*     HTN - Well controlled at this time beta-blocker.  MRSA screen +  Obesity - Body mass index is 33.73 kg/m.  Follow with PCP for weight  loss.    DVT prophylaxis: warfarin  Code Status: DNR - NO CODE (confirmed to me by patient herself) Family Communication: no family present at time of exam  Disposition Plan: SDU   Consultants:  Cards  Antimicrobials:  none   Objective: Blood pressure 131/72, pulse (!) 115, temperature 97.9 F (36.6 C), temperature source Oral, resp. rate 20, height 5\' 6"  (Patel.676 m), weight 94.8 kg, SpO2 94 %.  Intake/Output Summary (Last 24 hours) at 03/09/2018 1202 Last data filed at 03/09/2018 1000 Gross per 24 hour  Intake 620 ml  Output 1100 ml  Net -480 ml   Filed Weights   03/07/18 2146 03/08/18 0508 03/09/18 0523  Weight: 94.5 kg 94.5 kg 94.8 kg    Examination:  Awake Alert, Oriented X 3, No new F.N deficits, Normal affect [place].AT,PERRAL Supple Neck,No JVD, No cervical lymphadenopathy appriciated.  Symmetrical Chest wall movement, Good air movement bilaterally, CTAB iRRR,No Gallops, Rubs or new Murmurs, No Parasternal Heave +ve B.Sounds, Abd Soft, No tenderness, No organomegaly appriciated, No rebound - guarding or rigidity. No Cyanosis, Clubbing or edema, No new Rash or bruise   CBC: Recent Labs  Lab 03/07/18 1318 03/09/18 0423  WBC 10.7* 6.3  HGB 12.0 10.3*  HCT 38.0 32.3*  MCV 94.5 93.Patel  PLT 307 720   Basic Metabolic Panel: Recent Labs  Lab 03/07/18 1318 03/07/18 2032 03/09/18 0423  NA 136  --  138  K 4.2  --  3.9  CL 97*  --  105  CO2 26  --  24  GLUCOSE 115*  --  95  BUN 12  --  7*  CREATININE 0.64  --  0.57  CALCIUM 8.8*  --  8.0*  MG  --  Patel.8  --    GFR: Estimated Creatinine Clearance: 58.6 mL/min (by C-G formula based on SCr of 0.57 mg/dL).  Liver Function Tests: Recent Labs  Lab 03/09/18 0423  AST 17  ALT 17  ALKPHOS 60  BILITOT 0.6  PROT 5.5*  ALBUMIN 2.3*   No results for input(s): LIPASE, AMYLASE in the last 168 hours. No results for input(s): AMMONIA in the last 168 hours.  Coagulation Profile: Recent Labs  Lab 03/07/18 1318  03/08/18 0836 03/09/18 0423  INR Patel.95 2.52 3.13    Cardiac Enzymes: Recent Labs  Lab 03/07/18 2032 03/08/18 0304 03/08/18 0836  TROPONINI <0.03 <0.03 <0.03    HbA1C: Hgb A1c MFr Bld  Date/Time Value Ref Range Status  03/07/2018 01:18 PM 6.0 (H) 4.8 - 5.6 % Final    Comment:    (NOTE) Pre diabetes:          5.7%-6.4% Diabetes:              >6.4% Glycemic control for   <7.0% adults with diabetes     CBG: Recent Labs  Lab 03/08/18 1119 03/08/18 1656 03/08/18 2134 03/09/18 0757 03/09/18 1143  GLUCAP 123* 91 95 95 121*    Recent Results (from the past 240 hour(s))  MRSA PCR Screening     Status: Abnormal   Collection Time: 03/07/18  9:57 PM  Result Value Ref Range Status   MRSA by PCR POSITIVE (A) NEGATIVE Final    Comment:        The GeneXpert MRSA Assay (FDA approved for NASAL specimens only), is one component of a comprehensive MRSA colonization surveillance program. It is not intended to diagnose MRSA infection nor to guide or monitor treatment for MRSA infections. RESULT CALLED TO, READ BACK BY AND VERIFIED WITH: Valora Corporal RN [id number] 03/08/18 A BROWNING Performed at [facility], [place] [address] [place], [place] [address]      Scheduled Meds: . Chlorhexidine Gluconate Cloth  6 each Topical Q0600  . colchicine  0.6 mg Oral BID  . docusate sodium  100 mg Oral Daily  . feeding supplement (GLUCERNA SHAKE)  237 mL Oral TID BM  . fluticasone furoate-vilanterol  Patel puff Inhalation QHS  . glimepiride  Patel mg Oral Q breakfast  . insulin aspart  0-9 Units Subcutaneous TID WC  . loratadine  10 mg Oral Daily  . metFORMIN  500 mg Oral BID WC  . methocarbamol  750 mg Oral TID  . metoprolol succinate  150 mg Oral QHS  . mupirocin ointment  Patel application Nasal BID  . pantoprazole  40 mg Oral Daily  . warfarin  Patel mg Oral ONCE-1800  . Warfarin - Pharmacist Dosing Inpatient   Does not apply q1800   Continuous Infusions: . cefTRIAXone (ROCEPHIN)  IV Patel g  (03/08/18 1750)     LOS: 2 days   Signature  Lala Lund M.D on 03/09/2018 at 12:02 PM  To page go to [email or web address] - password [email or web address]

## 2018-03-09 NOTE — Progress Notes (Signed)
ANTICOAGULATION CONSULT NOTE  Pharmacy Consult:  Coumadin  Indication: atrial fibrillation  Allergies  Allergen Reactions  . Wheat Bran Shortness Of Breath  . Adhesive [Tape] Other (See Comments)    Blisters on skin  . Kiwi Extract Other (See Comments)    Causes mouth sores  . Orange Fruit [Citrus] Other (See Comments)    Causes mouth sores  . Pineapple Other (See Comments)    Causes mouth sores  . Promethazine Hcl Other (See Comments)    Disorientation   . Tomato Other (See Comments)    Causes mouth sores  . Tylenol [Acetaminophen] Other (See Comments)    Causes fast heart rate  . Vistaril [Hydroxyzine Hcl] Other (See Comments)    confusion  . Codeine Itching and Rash     Labs: Recent Labs    03/07/18 1318 03/07/18 2032 03/08/18 0304 03/08/18 0836 03/09/18 0423  HGB 12.0  --   --   --  10.3*  HCT 38.0  --   --   --  32.3*  PLT 307  --   --   --  296  LABPROT 22.1*  --   --  27.0* 31.9*  INR 1.95  --   --  2.52 3.13  CREATININE 0.64  --   --   --  0.57  TROPONINI  --  <0.03 <0.03 <0.03  --     Estimated Creatinine Clearance: 58.6 mL/min (by C-G formula based on SCr of 0.57 mg/dL).   Assessment: 84yo F admitted for weakness and fatigue on chronic Coumadin for Afib. INR slightly elevated today; no bleeding reported.  PTA warfarin regimen: 2.5 mg daily, except 5 mg on Sun and Thurs  Goal of Therapy:  INR 2-3 Monitor platelets by anticoagulation protocol: Yes    Plan:  Coumadin 1mg  PO today Daily PT / INR  Consider decreasing dose of methocarbamol in 84 year old female (500 TID for now), hold glimepiride or metformin d/t low normal CBGs, and establish stop date for abx.   Mercedes Fort D. Mina Marble, PharmD, BCPS, [place] 03/09/2018, 7:46 AM

## 2018-03-09 NOTE — Progress Notes (Signed)
Orthopedic Tech Progress Note Patient Details:  Casey Patel [birth year] [id number]  Ortho Devices Type of Ortho Device: Velcro wrist splint Ortho Device/Splint Location: rue Ortho Device/Splint Interventions: Application   Post Interventions Patient Tolerated: Well Instructions Provided: Care of device   Hildred Priest 03/09/2018, 2:13 PM

## 2018-03-10 DIAGNOSIS — E861 Hypovolemia: Secondary | ICD-10-CM

## 2018-03-10 LAB — CBC
HCT: 32.6 % — ABNORMAL LOW (ref 36.0–46.0)
HEMOGLOBIN: 10.5 g/dL — AB (ref 12.0–15.0)
MCH: 30 pg (ref 26.0–34.0)
MCHC: 32.2 g/dL (ref 30.0–36.0)
MCV: 93.1 fL (ref 78.0–100.0)
Platelets: 313 10*3/uL (ref 150–400)
RBC: 3.5 MIL/uL — ABNORMAL LOW (ref 3.87–5.11)
RDW: 15.8 % — AB (ref 11.5–15.5)
WBC: 6.3 10*3/uL (ref 4.0–10.5)

## 2018-03-10 LAB — BASIC METABOLIC PANEL
ANION GAP: 7 (ref 5–15)
BUN: 9 mg/dL (ref 8–23)
CO2: 27 mmol/L (ref 22–32)
Calcium: 8.1 mg/dL — ABNORMAL LOW (ref 8.9–10.3)
Chloride: 106 mmol/L (ref 98–111)
Creatinine, Ser: 0.6 mg/dL (ref 0.44–1.00)
GFR calc Af Amer: >60 mL/min (ref >60)
Glucose, Bld: 120 mg/dL — ABNORMAL HIGH (ref 70–99)
POTASSIUM: 4 mmol/L (ref 3.5–5.1)
Sodium: 140 mmol/L (ref 135–145)

## 2018-03-10 LAB — GLUCOSE, CAPILLARY
GLUCOSE-CAPILLARY: 124 mg/dL — AB (ref 70–99)
Glucose-Capillary: 106 mg/dL — ABNORMAL HIGH (ref 70–99)
Glucose-Capillary: 100 mg/dL — ABNORMAL HIGH (ref 70–99)
Glucose-Capillary: 104 mg/dL — ABNORMAL HIGH (ref 70–99)

## 2018-03-10 LAB — PROTIME-INR
INR: 2.61
Prothrombin Time: 27.7 seconds — ABNORMAL HIGH (ref 11.4–15.2)

## 2018-03-10 LAB — MAGNESIUM: Magnesium: 1.8 mg/dL (ref 1.7–2.4)

## 2018-03-10 MED ORDER — SODIUM CHLORIDE 0.9 % IV SOLN
INTRAVENOUS | Status: AC
  Administered 2018-03-10: 10:00:00 via INTRAVENOUS

## 2018-03-10 MED ORDER — WARFARIN SODIUM 2.5 MG PO TABS
2.5000 mg | ORAL_TABLET | Freq: Once | ORAL | Status: AC
  Administered 2018-03-10: 2.5 mg via ORAL
  Filled 2018-03-10: qty 1

## 2018-03-10 MED ORDER — DIGOXIN 0.25 MG/ML IJ SOLN: 0.1250 mg | Freq: Once | INTRAMUSCULAR | Status: DC

## 2018-03-10 NOTE — Progress Notes (Signed)
ANTICOAGULATION CONSULT NOTE  Pharmacy Consult:  Coumadin  Indication: atrial fibrillation  Allergies  Allergen Reactions  . Wheat Bran Shortness Of Breath  . Adhesive [Tape] Other (See Comments)    Blisters on skin  . Kiwi Extract Other (See Comments)    Causes mouth sores  . Orange Fruit [Citrus] Other (See Comments)    Causes mouth sores  . Pineapple Other (See Comments)    Causes mouth sores  . Promethazine Hcl Other (See Comments)    Disorientation   . Tomato Other (See Comments)    Causes mouth sores  . Tylenol [Acetaminophen] Other (See Comments)    Causes fast heart rate  . Vistaril [Hydroxyzine Hcl] Other (See Comments)    confusion  . Codeine Itching and Rash     Labs: Recent Labs    03/07/18 1318 03/07/18 2032 03/08/18 0304 03/08/18 0836 03/09/18 0423 03/10/18 0601  HGB 12.0  --   --   --  10.3* 10.5*  HCT 38.0  --   --   --  32.3* 32.6*  PLT 307  --   --   --  296 313  LABPROT 22.1*  --   --  27.0* 31.9* 27.7*  INR 1.95  --   --  2.52 3.13 2.61  CREATININE 0.64  --   --   --  0.57 0.60  TROPONINI  --  <0.03 <0.03 <0.03  --   --     Estimated Creatinine Clearance: 58 mL/min (by C-G formula based on SCr of 0.6 mg/dL).   Assessment: 84yo F admitted for weakness and fatigue on chronic Coumadin for Afib.  INR today = 2.61  PTA warfarin regimen: 2.5 mg daily, except 5 mg on Sun and Thurs  Goal of Therapy:  INR 2-3 Monitor platelets by anticoagulation protocol: Yes    Plan:  Coumadin 2.5 mg PO today Daily PT / INR  Consider decreasing dose of methocarbamol in 84 year old female (500 TID for now), hold glimepiride or metformin d/t low normal CBGs   Thank you Anette Guarneri, PharmD [phone] 03/10/2018, 9:46 AM

## 2018-03-10 NOTE — Progress Notes (Addendum)
Progress Note  Patient Name: Casey Patel Date of Encounter: 03/10/2018  Primary Cardiologist: Peter Martinique, MD   Subjective   She reports feeling better this morning. Wrist is less painful. She denies chest pain, SOB, or palpitations. She is unaware of her atrial fibrillation. She is anticipating discharge to a rehab facility when medically stable.   Inpatient Medications    Scheduled Meds: . Chlorhexidine Gluconate Cloth  6 each Topical Q0600  . colchicine  0.6 mg Oral BID  . docusate sodium  100 mg Oral Daily  . feeding supplement (GLUCERNA SHAKE)  237 mL Oral TID BM  . fluticasone furoate-vilanterol  1 puff Inhalation QHS  . glimepiride  1 mg Oral Q breakfast  . insulin aspart  0-9 Units Subcutaneous TID WC  . loratadine  10 mg Oral Daily  . metFORMIN  500 mg Oral BID WC  . methocarbamol  750 mg Oral TID  . metoprolol succinate  150 mg Oral QHS  . mupirocin ointment  1 application Nasal BID  . pantoprazole  40 mg Oral Daily  . Warfarin - Pharmacist Dosing Inpatient   Does not apply q1800   Continuous Infusions: . sodium chloride    . cefTRIAXone (ROCEPHIN)  IV Stopped (03/09/18 1734)   PRN Meds: acetaminophen **OR** [DISCONTINUED] acetaminophen, [DISCONTINUED] ondansetron **OR** ondansetron (ZOFRAN) IV, traMADol   Vital Signs    Vitals:   03/09/18 2215 03/09/18 2330 03/10/18 0300 03/10/18 0807  BP: 131/63 134/82 (!) 121/57 122/67  Pulse: (!) 105 100 97 95  Resp: 17 20 19 19   Temp: (!) 97.4 F (36.3 C)  98.1 F (36.7 C) 98 F (36.7 C)  TempSrc: Oral  Oral Oral  SpO2: 92% 96% 95% 96%  Weight:   93.1 kg   Height:        Intake/Output Summary (Last 24 hours) at 03/10/2018 0816 Last data filed at 03/10/2018 0600 Gross per 24 hour  Intake 1200.58 ml  Output 300 ml  Net 900.58 ml   Filed Weights   03/08/18 0508 03/09/18 0523 03/10/18 0300  Weight: 94.5 kg 94.8 kg 93.1 kg    Telemetry    Atrial fibrillation with rates ranging from 80s-100s over the past  12 hours.  - Personally Reviewed  Physical Exam   GEN: Laying in bed in no acute distress.   Neck: No JVD, no carotid bruits Cardiac: IRIR, no murmurs, rubs, or gallops.  Respiratory: Clear to auscultation bilaterally, no wheezes/ rales/ rhonchi GI: NABS, Soft, obese, nontender, non-distended  MS: No edema; No deformity. Neuro:  Nonfocal, moving all extremities spontaneously Psych: Normal affect   Labs    Chemistry Recent Labs  Lab 03/07/18 1318 03/09/18 0423 03/10/18 0601  NA 136 138 140  K 4.2 3.9 4.0  CL 97* 105 106  CO2 26 24 27   GLUCOSE 115* 95 120*  BUN 12 7* 9  CREATININE 0.64 0.57 0.60  CALCIUM 8.8* 8.0* 8.1*  PROT  --  5.5*  --   ALBUMIN  --  2.3*  --   AST  --  17  --   ALT  --  17  --   ALKPHOS  --  60  --   BILITOT  --  0.6  --   GFRNONAA >60 >60 >60  GFRAA >60 >60 >60  ANIONGAP 13 9 7      Hematology Recent Labs  Lab 03/07/18 1318 03/09/18 0423 03/10/18 0601  WBC 10.7* 6.3 6.3  RBC 4.02 3.47* 3.50*  HGB 12.0  10.3* 10.5*  HCT 38.0 32.3* 32.6*  MCV 94.5 93.1 93.1  MCH 29.9 29.7 30.0  MCHC 31.6 31.9 32.2  RDW 16.0* 15.9* 15.8*  PLT 307 296 313    Cardiac Enzymes Recent Labs  Lab 03/07/18 2032 03/08/18 0304 03/08/18 0836  TROPONINI <0.03 <0.03 <0.03    Recent Labs  Lab 03/07/18 1335  TROPIPOC 0.02     BNPNo results for input(s): BNP, PROBNP in the last 168 hours.   DDimer No results for input(s): DDIMER in the last 168 hours.   Radiology    Dg Wrist Complete Right  Result Date: 03/09/2018 CLINICAL DATA:  Ongoing right wrist pain and swelling. History of gout and arthritis. EXAM: RIGHT WRIST - COMPLETE 3+ VIEW COMPARISON:  Right hand radiographs - 03/07/2018 FINDINGS: Osteopenia. No acute fracture or dislocation. Severe degenerative change involving the STT joints of the base of thumb with joint space loss, subchondral sclerosis and osteophytosis. Several tiny ossicles are noted about the base of the thumb without discrete donor  sites. Note is made of mild widening of the scapholunate articulation with proximal migration of the capitate and associated degenerative change. No evidence of chondrocalcinosis. No displacement of the pronator quadratus fat pad. Distal vascular calcifications. IMPRESSION: 1. Osteopenia without displaced fracture. 2. Severe degenerative change involving the base of the thumb. 3. Mild widening of the scapholunate articulation with proximal migration of the capitate suggestive of previous ligamentous injury. Electronically Signed   By: Sandi Mariscal M.D.   On: 03/09/2018 12:59    Cardiac Studies   Echocardiogram 03/08/18: Study Conclusions  - Left ventricle: The cavity size was normal. Wall thickness was   normal. Systolic function was normal. The estimated ejection   fraction was in the range of 55% to 60%. Wall motion was normal;   there were no regional wall motion abnormalities. - Mitral valve: There was mild regurgitation. - Left atrium: The atrium was moderately dilated. - Right ventricle: Systolic function was mildly to moderately   reduced. - Right atrium: The atrium was moderately dilated. - Tricuspid valve: There was mild-moderate regurgitation directed   centrally. - Pulmonary arteries: PA peak pressure: 55 mm Hg (S).   Patient Profile     84 y.o. female with a PMH of permanent atrial fibrillation, HTN, DM type 2, and asthma, who presented with a UTI. Cardiology following for afib RVR  Assessment & Plan    1. Atrial fibrillation with RVR: intermittent RVR this admission in the setting of infection. Managed with diltiazem gtt initially (stopped 03/08/18), then started on digoxin (03/09/18), and home metoprolol XL. BP was initially soft. She was given IVFs. HR is overall improved today. Suspect this will continue to improve with management of UTI and dehydration.  - Will stop digoxin at this time - Continue metoprolol XL at current dose - Would favor using prn metoprolol tartrate  as needed for sustained HR >130 - Continue warfarin per pharmacy  2. HTN: BP improved - Continue metoprolol XL - Can likely resume home lasix at discharge.   For questions or updates, please contact [place] Please consult [email or web address] for contact info under Cardiology/STEMI.    Signed, Abigail Butts, PA-C  03/10/2018, 8:16 AM   [phone]  The patient was seen, examined and discussed with Abigail Butts, PA-C  and I agree with the above.   Rate is uncontrolled in the settings of UTI and hypovolemia, hydrated overnight with 75 cc of normal saline per hour, she is feeling significantly better  today, heart rate improved, and 80s overnight low 100s this morning with motion. I would continue Toprol-XL 150 mg daily, give additional metoprolol 5 mg IV every 6 hours as needed for heart rate over 130 bpm, this rapid ventricular response will resolve with treatment of underlying infection.  Continue warfarin.  We will sign off call us with any questions.  [facility] will sign off.   Medication Recommendations:  As above  Other recommendations (labs, testing, etc): No further testing Follow up as an outpatient: As scheduled  Ena Dawley, MD 03/10/2018

## 2018-03-10 NOTE — Progress Notes (Signed)
[name] - Stepdown/ICU Casey Patel Can  YBW:[id number] DOB: 11-Oct-1929 DOA: 03/07/2018 PCP: [name], MD    Brief Narrative:  84 y.o. F w/ a hx of atrial fibrillation, HTN, Asthma, and DM who presented to the ED after a mechanical fall to her L knee on fall 8/31, and then again 9/7, at which time she tripped and landed on her buttocks. Since the most recent fall the patient reported increased generalized weakness and pain with resultant inability to ambulate (uses walker or wheelchair at baseline).   In the ED she was found to be tachycardic to 149 in Afib.   Subjective: Patient in bed, appears comfortable, denies any headache, no fever, no chest pain or pressure, no shortness of breath , no abdominal pain. No focal weakness.  R.Wrist pain is much improved.    Assessment & Plan:  Chronic Afib with acute RVR -Mali vas 2 score of at least 3.  Patient under the care of EP physician [name], continues to be on high-dose beta-blocker, received 3 doses of IV digoxin between 03/09/2018 and 03/10/2018, also mild gentle hydration as cardiology thought she was slightly dehydrated, continue Coumadin, echo reviewed with preserved EF, stable TSH, heart rate has improved after digoxin, cardiology also on board.  Monitor rate closely for another 24 hours.  Mechanical falls -  Likely due to deconditioning + weakness/DH associated w/ UTI - PT/OT to see, may require placement.  UTI - treated empirically with 3 doses of Rocephin.  Right hand pain - reports similar history improved with steroids - X-ray right hand noted demineralization with extensive degenerative changes -we will uric acid may benefit from outpatient orthopedic/rheumatology follow-up.  X-ray of the right wrist is stable as well, wrist splint applied on 03/09/2018 with good relief.  She feels much better now.  DM2 - A1c 6.0 - CBG currently well controlled , currently on combination of Glucophage and glipizide.  CBG  (last 3)  Recent Labs    03/09/18 2212 03/10/18 0735 03/10/18 1145  GLUCAP 99 124* 106*    HTN - Well controlled at this time beta-blocker.  MRSA screen +  Obesity - Body mass index is 33.13 kg/m.  Follow with PCP for weight loss.     DVT prophylaxis: warfarin  Code Status: DNR - NO CODE (confirmed to me by patient herself) Family Communication: no family present at time of exam  Disposition Plan: SDU till good rate control is achieved  Consultants:  Cards  Antimicrobials:  none   Objective: Blood pressure 122/67, pulse 95, temperature 98 F (36.7 C), temperature source Oral, resp. rate 19, height 5\' 6"  (1.676 m), weight 93.1 kg, SpO2 96 %.  Intake/Output Summary (Last 24 hours) at 03/10/2018 1247 Last data filed at 03/10/2018 0900 Gross per 24 hour  Intake 840.58 ml  Output 700 ml  Net 140.58 ml   Filed Weights   03/08/18 0508 03/09/18 0523 03/10/18 0300  Weight: 94.5 kg 94.8 kg 93.1 kg    Examination:  Awake Alert, Oriented X 3, No new F.N deficits, Normal affect [place].AT,PERRAL Supple Neck,No JVD, No cervical lymphadenopathy appriciated.  Symmetrical Chest wall movement, Good air movement bilaterally, CTAB RRR,No Gallops, Rubs or new Murmurs, No Parasternal Heave +ve B.Sounds, Abd Soft, No tenderness, No organomegaly appriciated, No rebound - guarding or rigidity. No Cyanosis, Clubbing or edema, No new Rash or bruise    CBC: Recent Labs  Lab 03/07/18 1318 03/09/18 0423 03/10/18 0601  WBC 10.7* 6.3  6.3  HGB 12.0 10.3* 10.5*  HCT 38.0 32.3* 32.6*  MCV 94.5 93.1 93.1  PLT 307 296 580   Basic Metabolic Panel: Recent Labs  Lab 03/07/18 1318 03/07/18 2032 03/09/18 0423 03/10/18 0601  NA 136  --  138 140  K 4.2  --  3.9 4.0  CL 97*  --  105 106  CO2 26  --  24 27  GLUCOSE 115*  --  95 120*  BUN 12  --  7* 9  CREATININE 0.64  --  0.57 0.60  CALCIUM 8.8*  --  8.0* 8.1*  MG  --  1.8  --  1.8   GFR: Estimated Creatinine Clearance: 58 mL/min  (by C-G formula based on SCr of 0.6 mg/dL).  Liver Function Tests: Recent Labs  Lab 03/09/18 0423  AST 17  ALT 17  ALKPHOS 60  BILITOT 0.6  PROT 5.5*  ALBUMIN 2.3*   No results for input(s): LIPASE, AMYLASE in the last 168 hours. No results for input(s): AMMONIA in the last 168 hours.  Coagulation Profile: Recent Labs  Lab 03/07/18 1318 03/08/18 0836 03/09/18 0423 03/10/18 0601  INR 1.95 2.52 3.13 2.61    Cardiac Enzymes: Recent Labs  Lab 03/07/18 2032 03/08/18 0304 03/08/18 0836  TROPONINI <0.03 <0.03 <0.03    HbA1C: Hgb A1c MFr Bld  Date/Time Value Ref Range Status  03/07/2018 01:18 PM 6.0 (H) 4.8 - 5.6 % Final    Comment:    (NOTE) Pre diabetes:          5.7%-6.4% Diabetes:              >6.4% Glycemic control for   <7.0% adults with diabetes     CBG: Recent Labs  Lab 03/09/18 1143 03/09/18 1703 03/09/18 2212 03/10/18 0735 03/10/18 1145  GLUCAP 121* 100* 99 124* 106*    Recent Results (from the past 240 hour(s))  MRSA PCR Screening     Status: Abnormal   Collection Time: 03/07/18  9:57 PM  Result Value Ref Range Status   MRSA by PCR POSITIVE (A) NEGATIVE Final    Comment:        The GeneXpert MRSA Assay (FDA approved for NASAL specimens only), is one component of a comprehensive MRSA colonization surveillance program. It is not intended to diagnose MRSA infection nor to guide or monitor treatment for MRSA infections. RESULT CALLED TO, READ BACK BY AND VERIFIED WITH: Valora Corporal RN [id number] 03/08/18 A BROWNING Performed at [facility], [place] [address], [place], [place] [address]      Scheduled Meds: . Chlorhexidine Gluconate Cloth  6 each Topical Q0600  . colchicine  0.6 mg Oral BID  . docusate sodium  100 mg Oral Daily  . feeding supplement (GLUCERNA SHAKE)  237 mL Oral TID BM  . fluticasone furoate-vilanterol  1 puff Inhalation QHS  . glimepiride  1 mg Oral Q breakfast  . insulin aspart  0-9 Units Subcutaneous TID WC  .  loratadine  10 mg Oral Daily  . metFORMIN  500 mg Oral BID WC  . methocarbamol  750 mg Oral TID  . metoprolol succinate  150 mg Oral QHS  . mupirocin ointment  1 application Nasal BID  . pantoprazole  40 mg Oral Daily  . warfarin  2.5 mg Oral ONCE-1800  . Warfarin - Pharmacist Dosing Inpatient   Does not apply q1800   Continuous Infusions: . cefTRIAXone (ROCEPHIN)  IV Stopped (03/09/18 1734)     LOS:  3 days   Signature  Lala Lund M.D on 03/10/2018 at 12:47 PM  To page go to [email or web address] - password [facility]

## 2018-03-10 NOTE — Progress Notes (Addendum)
3:48 pm Patient and patient's sister, Vaughan Basta, chose [facility], but no beds available at [facility]. Second choice is [facility], and [facility] confirmed they can accept patient tomorrow, 03/11/18. CSW updated patient and sister. CSW to follow and support with discharge when medically ready.  10:37 am CSW provided list of SNF bed offers to patient. Patient to review bed offers with her sister when sister arrives at the hospital. CSW to follow.  Estanislado Emms, [place]

## 2018-03-11 ENCOUNTER — Ambulatory Visit: Payer: Self-pay

## 2018-03-11 DIAGNOSIS — M1380 Other specified arthritis, unspecified site: Secondary | ICD-10-CM | POA: Diagnosis not present

## 2018-03-11 DIAGNOSIS — R2689 Other abnormalities of gait and mobility: Secondary | ICD-10-CM | POA: Diagnosis not present

## 2018-03-11 DIAGNOSIS — Z7901 Long term (current) use of anticoagulants: Secondary | ICD-10-CM | POA: Diagnosis not present

## 2018-03-11 DIAGNOSIS — M1990 Unspecified osteoarthritis, unspecified site: Secondary | ICD-10-CM | POA: Diagnosis not present

## 2018-03-11 DIAGNOSIS — M2550 Pain in unspecified joint: Secondary | ICD-10-CM | POA: Diagnosis not present

## 2018-03-11 DIAGNOSIS — N390 Urinary tract infection, site not specified: Secondary | ICD-10-CM | POA: Diagnosis not present

## 2018-03-11 DIAGNOSIS — Z111 Encounter for screening for respiratory tuberculosis: Secondary | ICD-10-CM | POA: Diagnosis not present

## 2018-03-11 DIAGNOSIS — L89322 Pressure ulcer of left buttock, stage 2: Secondary | ICD-10-CM | POA: Diagnosis not present

## 2018-03-11 DIAGNOSIS — M6281 Muscle weakness (generalized): Secondary | ICD-10-CM | POA: Diagnosis not present

## 2018-03-11 DIAGNOSIS — I10 Essential (primary) hypertension: Secondary | ICD-10-CM | POA: Diagnosis not present

## 2018-03-11 DIAGNOSIS — R52 Pain, unspecified: Secondary | ICD-10-CM | POA: Diagnosis not present

## 2018-03-11 DIAGNOSIS — E119 Type 2 diabetes mellitus without complications: Secondary | ICD-10-CM | POA: Diagnosis not present

## 2018-03-11 DIAGNOSIS — E785 Hyperlipidemia, unspecified: Secondary | ICD-10-CM | POA: Diagnosis not present

## 2018-03-11 DIAGNOSIS — I4891 Unspecified atrial fibrillation: Secondary | ICD-10-CM | POA: Diagnosis not present

## 2018-03-11 DIAGNOSIS — K219 Gastro-esophageal reflux disease without esophagitis: Secondary | ICD-10-CM | POA: Diagnosis not present

## 2018-03-11 DIAGNOSIS — Z9181 History of falling: Secondary | ICD-10-CM | POA: Diagnosis not present

## 2018-03-11 DIAGNOSIS — R296 Repeated falls: Secondary | ICD-10-CM | POA: Diagnosis not present

## 2018-03-11 DIAGNOSIS — Z7401 Bed confinement status: Secondary | ICD-10-CM | POA: Diagnosis not present

## 2018-03-11 DIAGNOSIS — M1993 Secondary osteoarthritis, unspecified site: Secondary | ICD-10-CM | POA: Diagnosis not present

## 2018-03-11 DIAGNOSIS — R05 Cough: Secondary | ICD-10-CM | POA: Diagnosis not present

## 2018-03-11 DIAGNOSIS — J45909 Unspecified asthma, uncomplicated: Secondary | ICD-10-CM

## 2018-03-11 DIAGNOSIS — E0800 Diabetes mellitus due to underlying condition with hyperosmolarity without nonketotic hyperglycemic-hyperosmolar coma (NKHHC): Secondary | ICD-10-CM

## 2018-03-11 DIAGNOSIS — I482 Chronic atrial fibrillation: Secondary | ICD-10-CM | POA: Diagnosis not present

## 2018-03-11 DIAGNOSIS — K5900 Constipation, unspecified: Secondary | ICD-10-CM | POA: Diagnosis not present

## 2018-03-11 DIAGNOSIS — J189 Pneumonia, unspecified organism: Secondary | ICD-10-CM | POA: Diagnosis not present

## 2018-03-11 DIAGNOSIS — E669 Obesity, unspecified: Secondary | ICD-10-CM | POA: Diagnosis not present

## 2018-03-11 LAB — GLUCOSE, CAPILLARY
GLUCOSE-CAPILLARY: 113 mg/dL — AB (ref 70–99)
Glucose-Capillary: 106 mg/dL — ABNORMAL HIGH (ref 70–99)

## 2018-03-11 LAB — PROTIME-INR
INR: 2.09
PROTHROMBIN TIME: 23.3 s — AB (ref 11.4–15.2)

## 2018-03-11 MED ORDER — DOCUSATE SODIUM 100 MG PO CAPS: 100.0000 mg | ORAL_CAPSULE | Freq: Every day | ORAL | 0 refills | Status: AC | PRN

## 2018-03-11 MED ORDER — TRAMADOL HCL 50 MG PO TABS: 50.0000 mg | ORAL_TABLET | Freq: Four times a day (QID) | ORAL | 0 refills | Status: DC | PRN

## 2018-03-11 MED ORDER — COLCHICINE 0.6 MG PO TABS: 0.6000 mg | ORAL_TABLET | Freq: Every day | ORAL | 0 refills | Status: DC

## 2018-03-11 MED ORDER — WARFARIN SODIUM 4 MG PO TABS: 4.0000 mg | ORAL_TABLET | Freq: Once | ORAL | Status: DC

## 2018-03-11 MED ORDER — GLUCERNA SHAKE PO LIQD: 237.0000 mL | Freq: Three times a day (TID) | ORAL | 0 refills | Status: DC

## 2018-03-11 NOTE — Progress Notes (Signed)
Patient will discharge to [facility] Anticipated discharge date: 03/11/18 Family notified: Janalyn Shy, sister Transportation by: Corey Harold  Nurse to call report to [phone].   CSW signing off.  Estanislado Emms, [place]  Clinical Social Worker

## 2018-03-11 NOTE — Clinical Social Work Placement (Signed)
   CLINICAL SOCIAL WORK PLACEMENT  NOTE  Date:  03/11/2018  Patient Details  Name: Casey Patel MRN: [id number] Date of Birth: 1930/02/16  Clinical Social Work is seeking post-discharge placement for this patient at the [place] level of care (*CSW will initial, date and re-position this form in  chart as items are completed):  Yes   Patient/family provided with [facility] Work Department's list of facilities offering this level of care within the geographic area requested by the patient (or if unable, by the patient's family).  Yes   Patient/family informed of their freedom to choose among providers that offer the needed level of care, that participate in Medicare, Medicaid or managed care program needed by the patient, have an available bed and are willing to accept the patient.  Yes   Patient/family informed of [facility]'s ownership interest in [facility] and [facility], as well as of the fact that they are under no obligation to receive care at these facilities.  PASRR submitted to EDS on 03/09/18     PASRR number received on 03/09/18     Existing PASRR number confirmed on       FL2 transmitted to all facilities in geographic area requested by pt/family on 03/09/18     FL2 transmitted to all facilities within larger geographic area on       Patient informed that his/her managed care company has contracts with or will negotiate with certain facilities, including the following:  [facility]     Yes   Patient/family informed of bed offers received.  Patient chooses bed at [facility]     Physician recommends and patient chooses bed at      [facility].  Patient to be transferred to facility by [facility]     Patient family notified on 03/11/18 of transfer.  Name of family member notified:  Janalyn Shy, sister     PHYSICIAN Please prepare priority discharge summary, including medications, Please  prepare prescriptions, Please sign DNR     Additional Comment:    _______________________________________________ Estanislado Emms, LCSW 03/11/2018, 11:04 AM

## 2018-03-11 NOTE — Plan of Care (Signed)
  Problem: Activity: Goal: Ability to tolerate increased activity will improve Outcome: Progressing Note:  Up with one assist to [facility]; tolerates well.

## 2018-03-11 NOTE — Progress Notes (Signed)
ANTICOAGULATION CONSULT NOTE  Pharmacy Consult:  Coumadin  Indication: atrial fibrillation  Allergies  Allergen Reactions  . Wheat Bran Shortness Of Breath  . Adhesive [Tape] Other (See Comments)    Blisters on skin  . Kiwi Extract Other (See Comments)    Causes mouth sores  . Orange Fruit [Citrus] Other (See Comments)    Causes mouth sores  . Pineapple Other (See Comments)    Causes mouth sores  . Promethazine Hcl Other (See Comments)    Disorientation   . Tomato Other (See Comments)    Causes mouth sores  . Tylenol [Acetaminophen] Other (See Comments)    Causes fast heart rate  . Vistaril [Hydroxyzine Hcl] Other (See Comments)    confusion  . Codeine Itching and Rash     Labs: Recent Labs    03/09/18 0423 03/10/18 0601 03/11/18 0444  HGB 10.3* 10.5*  --   HCT 32.3* 32.6*  --   PLT 296 313  --   LABPROT 31.9* 27.7* 23.3*  INR 3.13 2.61 2.09  CREATININE 0.57 0.60  --     Estimated Creatinine Clearance: 58.6 mL/min (by C-G formula based on SCr of 0.6 mg/dL).  Assessment: 84yo F admitted for weakness and fatigue on chronic Coumadin for Afib.   INR today decreased from 2.61 to 2.09, still within therapeutic range. Hgb on last check 9/12 was 10.5, plt 313. No s/sx of bleeding documented. No new medication interactions. 75% of meals documented as eaten yesterday.  PTA warfarin regimen: 2.5 mg daily, except 5 mg on Sun and Thurs  Goal of Therapy:  INR 2-3 Monitor platelets by anticoagulation protocol: Yes   Plan:  Coumadin 4 mg PO today Daily PT / INR  Consider decreasing dose of methocarbamol in 84 year old female (500 TID for now), hold glimepiride d/t low-normal CBGs  Thank you Doylene Canard, PharmD Clinical Pharmacist  Pager: [phone] Phone: [phone] 03/11/2018, 8:41 AM

## 2018-03-11 NOTE — Progress Notes (Signed)
PT Cancellation Note  Patient Details Name: Casey Patel MRN: [id number] DOB: Jan 31, 1930   Cancelled Treatment:    Reason Eval/Treat Not Completed: Other (comment). Pt already discharged with transport to SNF.  Mabeline Caras, PT, DPT Acute Rehabilitation Services  Pager [phone] Office [place] 03/11/2018, 3:23 PM

## 2018-03-11 NOTE — Discharge Summary (Signed)
Physician Discharge Summary  Manhattan Mccuen JSE:[id number] DOB: 14-Oct-1929 DOA: 03/07/2018  PCP: [name], MD  Admit date: 03/07/2018 Discharge date: 03/11/2018  Admitted From: Home Disposition: SNF   Recommendations for Outpatient Follow-up:  1. Follow up with PCP in 1-2 weeks 2. Please obtain BMP/CBC in one week  Home Health: N/A Equipment/Devices: Per SNF Discharge Condition: Stable CODE STATUS: DNR (confirmed) Diet recommendation: Heart healthy, carb-modified  Brief/Interim Summary: 84 y.o.F w/ a hx of atrial fibrillation, HTN, Asthma, and DM who presented to the ED after a mechanical fall to her L knee on fall 8/31, and then again 9/7, at which time she tripped and landed on her buttocks. Since the most recent fall the patient reported increased generalized weakness and pain with resultant inability to ambulate (uses walker or wheelchair at baseline).In the ED she was found to be tachycardic to 149 in Afib.  Urinalysis was suggestive of UTI so ceftriaxone was given and cardiology consulted with eventual improvement in rate control. Physical therapy has recommended short term rehabilitation at discharge, and she will be discharged to SNF in stable condition.   Discharge Diagnoses:  Principal Problem:   Atrial fibrillation ([place]) Active Problems:   Arthritis   Hypertension   Asthma   DM (diabetes mellitus) ([place])   Pressure injury of skin  Chronic Afib with acute RVR: CHA2DS2-VASc score is elevated. Preserved EF, TSH 0.847.  - Continue coumadin.  - Follow up with Dr. Martinique, continue metoprolol. Was also given digoxin for assistance controlling rate while UTI was treated.  Mechanicalfalls: Due to deconditioning, UTI.  - Requires SNF placement    UTI: Treated empirically with 3 doses of Rocephin. WBC normalized, afebrile, no urinary symptoms.Urine culture remains pending.   Right hand pain: X-ray right hand noted demineralization with extensive degenerative changes   - Colchicine started, would benefit from outpatient orthopedic/rheumatology follow-up.   - X-ray of the right wrist is stable as well, wrist splint applied on 03/09/2018 with good relief.  DM2 - A1c 6.0 - CBG currently well controlled. - Continue glucophage and glipizide.  HTN - Well controlled at this time - Cotninue beta-blocker.  MRSA screen +  Obesity - Body mass index is 33.13 kg/m.   - Follow with PCP for weight loss.  Discharge Instructions Discharge Instructions    Diet - low sodium heart healthy   Complete by:  As directed    Diet Carb Modified   Complete by:  As directed    Increase activity slowly   Complete by:  As directed      Allergies as of 03/11/2018      Reactions   Wheat Bran Shortness Of Breath   Adhesive [tape] Other (See Comments)   Blisters on skin   Kiwi Extract Other (See Comments)   Causes mouth sores   Orange Fruit [citrus] Other (See Comments)   Causes mouth sores   Pineapple Other (See Comments)   Causes mouth sores   Promethazine Hcl Other (See Comments)   Disorientation   Tomato Other (See Comments)   Causes mouth sores   Tylenol [acetaminophen] Other (See Comments)   Causes fast heart rate   Vistaril [hydroxyzine Hcl] Other (See Comments)   confusion   Codeine Itching, Rash      Medication List    TAKE these medications   BREO ELLIPTA 100-25 MCG/INH Aepb Generic drug:  fluticasone furoate-vilanterol Inhale 1 puff into the lungs at bedtime.   colchicine 0.6 MG tablet Take 1 tablet (0.6 mg total)  by mouth daily.   docusate sodium 100 MG capsule Commonly known as:  COLACE Take 1 capsule (100 mg total) by mouth daily as needed for mild constipation. What changed:    when to take this  reasons to take this   feeding supplement (GLUCERNA SHAKE) Liqd Take 237 mLs by mouth 3 (three) times daily between meals.   fexofenadine 180 MG tablet Commonly known as:  ALLEGRA Take 180 mg by mouth daily at 6 PM.   furosemide 20  MG tablet Commonly known as:  LASIX Take 20 mg by mouth Daily.   glimepiride 1 MG tablet Commonly known as:  AMARYL Take 1 mg by mouth daily with breakfast.   metFORMIN 500 MG tablet Commonly known as:  GLUCOPHAGE Take 500 mg by mouth 2 (two) times daily with a meal.   methocarbamol 750 MG tablet Commonly known as:  ROBAXIN Take 750 mg by mouth 3 (three) times daily.   metoprolol succinate 100 MG 24 hr tablet Commonly known as:  TOPROL-XL Take 150 mg by mouth at bedtime.   omeprazole 20 MG capsule Commonly known as:  PRILOSEC Take 20 mg by mouth daily.   traMADol 50 MG tablet Commonly known as:  ULTRAM Take 1 tablet (50 mg total) by mouth every 6 (six) hours as needed (pain).   Vitamin D 2000 units Caps Take 2,000 Units by mouth daily.   warfarin 5 MG tablet Commonly known as:  COUMADIN Take 2.5-5 mg by mouth See admin instructions. Take 1 tablet (5 mg) by mouth on Sunday and Thursday at 5pm, take 1/2 tablet (2.5 mg) on Monday, Tuesday, Wednesday, Friday, Saturday at [employer] information for after-discharge care    Destination    [facility] Preferred SNF .   Service:  Skilled Nursing Contact information: [address] [place] [phone]             Allergies  Allergen Reactions  . Wheat Bran Shortness Of Breath  . Adhesive [Tape] Other (See Comments)    Blisters on skin  . Kiwi Extract Other (See Comments)    Causes mouth sores  . Orange Fruit [Citrus] Other (See Comments)    Causes mouth sores  . Pineapple Other (See Comments)    Causes mouth sores  . Promethazine Hcl Other (See Comments)    Disorientation   . Tomato Other (See Comments)    Causes mouth sores  . Tylenol [Acetaminophen] Other (See Comments)    Causes fast heart rate  . Vistaril [Hydroxyzine Hcl] Other (See Comments)    confusion  . Codeine Itching and Rash    Consultations:  Cardiology  Procedures/Studies: Dg Chest 2  View  Result Date: 03/07/2018 CLINICAL DATA:  Weakness. EXAM: CHEST - 2 VIEW COMPARISON:  None. FINDINGS: Mild cardiomegaly is noted. No pneumothorax or pleural effusion is noted. Calcified granuloma is noted in right midlung. No consolidative process is noted. The visualized skeletal structures are unremarkable. IMPRESSION: No active cardiopulmonary disease. Electronically Signed   By: Marijo Conception, M.D.   On: 03/07/2018 14:42   Dg Pelvis 1-2 Views  Result Date: 03/07/2018 CLINICAL DATA:  Multiple falls, difficulty ambulating EXAM: PELVIS - 1-2 VIEW COMPARISON:  None FINDINGS: Osseous demineralization. Prior lumbar fusion. Hip and SI joint spaces preserved. No acute fracture, dislocation, or bone destruction. IMPRESSION: No acute abnormalities. Electronically Signed   By: Lavonia Dana M.D.   On: 03/07/2018 19:58   Dg Wrist Complete Right  Result Date: 03/09/2018 CLINICAL DATA:  Ongoing right wrist pain and swelling. History of gout and arthritis. EXAM: RIGHT WRIST - COMPLETE 3+ VIEW COMPARISON:  Right hand radiographs - 03/07/2018 FINDINGS: Osteopenia. No acute fracture or dislocation. Severe degenerative change involving the STT joints of the base of thumb with joint space loss, subchondral sclerosis and osteophytosis. Several tiny ossicles are noted about the base of the thumb without discrete donor sites. Note is made of mild widening of the scapholunate articulation with proximal migration of the capitate and associated degenerative change. No evidence of chondrocalcinosis. No displacement of the pronator quadratus fat pad. Distal vascular calcifications. IMPRESSION: 1. Osteopenia without displaced fracture. 2. Severe degenerative change involving the base of the thumb. 3. Mild widening of the scapholunate articulation with proximal migration of the capitate suggestive of previous ligamentous injury. Electronically Signed   By: Sandi Mariscal M.D.   On: 03/09/2018 12:59   Dg Hand 2 View Right  Result  Date: 03/07/2018 CLINICAL DATA:  RIGHT hand pain, arthritis, no injury EXAM: RIGHT HAND - 2 VIEW COMPARISON:  None FINDINGS: Osseous demineralization. Scattered narrowing of IP joints. Advanced degenerative changes at first [facility] joint and at distal pole of scaphoid. Additional midcarpal and radiocarpal degenerative changes with cystic change at the triquetrum. No acute fracture, dislocation, or additional bone destruction. Minimal narrowing of the first and fifth MCP joints. Dorsal soft tissue swelling overlying the carpus. IMPRESSION: Osseous demineralization with extensive degenerative changes as above. Electronically Signed   By: Lavonia Dana M.D.   On: 03/07/2018 20:02   Dg Knee Complete 4 Views Left  Result Date: 02/26/2018 CLINICAL DATA:  Left knee pain due to an injury suffered in a fall yesterday. Initial encounter. EXAM: LEFT KNEE - COMPLETE 4+ VIEW COMPARISON:  None. FINDINGS: No fracture or dislocation. Large joint effusion is noted. The patient has severe tricompartmental osteoarthritis. Chondrocalcinosis of the menisci is seen. Atherosclerosis noted. IMPRESSION: No acute bony abnormality. Large joint effusion. Advanced tricompartmental osteoarthritis. Chondrocalcinosis. Electronically Signed   By: Inge Rise M.D.   On: 02/26/2018 10:20     Subjective: Feels well, still diffusely weak but improving with PT, eager to get started at [facility]. No urinary complaints, fever, chills, abd pain, N/V/D. Right hand/wrist pain improved.   Discharge Exam: Vitals:   03/10/18 2200 03/11/18 0500  BP:  136/85  Pulse:  77  Resp:  17  Temp:  97.8 F (36.6 C)  SpO2: 91% 98%   General: Pt is alert, awake, not in acute distress Cardiovascular: Irreg irreg, rate in 80's, S1/S2 +, no rubs, no gallops Respiratory: CTA bilaterally, no wheezing, no rhonchi Abdominal: Soft, NT, ND, bowel sounds + Extremities: No edema, no cyanosis. Right wrist in splint with no focal TTP or erythema.  Labs: BNP (last 3  results) No results for input(s): BNP in the last 8760 hours. Basic Metabolic Panel: Recent Labs  Lab 03/07/18 1318 03/07/18 2032 03/09/18 0423 03/10/18 0601  NA 136  --  138 140  K 4.2  --  3.9 4.0  CL 97*  --  105 106  CO2 26  --  24 27  GLUCOSE 115*  --  95 120*  BUN 12  --  7* 9  CREATININE 0.64  --  0.57 0.60  CALCIUM 8.8*  --  8.0* 8.1*  MG  --  1.8  --  1.8   Liver Function Tests: Recent Labs  Lab 03/09/18 0423  AST 17  ALT 17  ALKPHOS 60  BILITOT 0.6  PROT 5.5*  ALBUMIN 2.3*   No results for input(s): LIPASE, AMYLASE in the last 168 hours. No results for input(s): AMMONIA in the last 168 hours. CBC: Recent Labs  Lab 03/07/18 1318 03/09/18 0423 03/10/18 0601  WBC 10.7* 6.3 6.3  HGB 12.0 10.3* 10.5*  HCT 38.0 32.3* 32.6*  MCV 94.5 93.1 93.1  PLT 307 296 313   Cardiac Enzymes: Recent Labs  Lab 03/07/18 2032 03/08/18 0304 03/08/18 0836  TROPONINI <0.03 <0.03 <0.03   BNP: Invalid input(s): POCBNP CBG: Recent Labs  Lab 03/10/18 0735 03/10/18 1145 03/10/18 1654 03/10/18 2158 03/11/18 0746  GLUCAP 124* 106* 100* 104* 113*   D-Dimer No results for input(s): DDIMER in the last 72 hours. Hgb A1c No results for input(s): HGBA1C in the last 72 hours. Lipid Profile No results for input(s): CHOL, HDL, LDLCALC, TRIG, CHOLHDL, LDLDIRECT in the last 72 hours. Thyroid function studies No results for input(s): TSH, T4TOTAL, T3FREE, THYROIDAB in the last 72 hours.  Invalid input(s): FREET3 Anemia work up No results for input(s): VITAMINB12, FOLATE, FERRITIN, TIBC, IRON, RETICCTPCT in the last 72 hours. Urinalysis    Component Value Date/Time   COLORURINE AMBER (A) 03/07/2018 2036   APPEARANCEUR HAZY (A) 03/07/2018 2036   LABSPEC 1.032 (H) 03/07/2018 2036   PHURINE 6.0 03/07/2018 2036   GLUCOSEU NEGATIVE 03/07/2018 2036   HGBUR NEGATIVE 03/07/2018 2036   BILIRUBINUR NEGATIVE 03/07/2018 2036   KETONESUR 20 (A) 03/07/2018 2036   PROTEINUR 30 (A)  03/07/2018 2036   NITRITE NEGATIVE 03/07/2018 2036   LEUKOCYTESUR MODERATE (A) 03/07/2018 2036    Microbiology Recent Results (from the past 240 hour(s))  MRSA PCR Screening     Status: Abnormal   Collection Time: 03/07/18  9:57 PM  Result Value Ref Range Status   MRSA by PCR POSITIVE (A) NEGATIVE Final    Comment:        The GeneXpert MRSA Assay (FDA approved for NASAL specimens only), is one component of a comprehensive MRSA colonization surveillance program. It is not intended to diagnose MRSA infection nor to guide or monitor treatment for MRSA infections. RESULT CALLED TO, READ BACK BY AND VERIFIED WITH: Valora Corporal RN [id number] 03/08/18 A BROWNING Performed at [facility], [place] [address] [place], [place] [address]     Time coordinating discharge: Approximately 40 minutes  Patrecia Pour, MD  [facility] 03/11/2018, 11:27 AM Pager [phone]

## 2018-03-11 NOTE — Progress Notes (Signed)
Pt transferring to [facility] SNF. Report called to [employer] .

## 2018-03-11 NOTE — Care Management Note (Signed)
Case Management Note  Patient Details  Name: Casey Patel MRN: [id number] Date of Birth: 05/22/1930  Subjective/Objective:   Pt presented for mechanical fall and found to be in Atrial Fib- plan for Coumadin. CSW has been assisting family with disposition needs for SNF @ [place].                Action/Plan: CM will continue to monitor for additional needs.   Expected Discharge Date:                  Expected Discharge Plan:  Skilled Nursing Facility  In-House Referral:  Clinical Social Work  Discharge planning Services  CM Consult  Post Acute Care Choice:  NA Choice offered to:  NA  DME Arranged:  N/A DME Agency:  NA  HH Arranged:  NA HH Agency:  NA  Status of Service:  Completed, signed off  If discussed at [place] of Stay Meetings, dates discussed:    Additional Comments:  Bethena Roys, RN 03/11/2018, 10:20 AM

## 2018-03-11 NOTE — Consult Note (Signed)
            [facility] CM Primary Care Navigator  3/75/4360  Crysten Kaman [birth year] [id number]   Went to see patientin the roomto identify possible discharge needs butshe was alreadydischarged perRN report.  Patientwas discharged to skilled nursing facility per therapy recommendation ([facility]).   Per chart review,patientpresented with increased generalized weakness and pain with resultant inability to ambulate after mechanical falls. She was also found in atrial fibrillation and cardiology was consulted.   Primary care provider's office is listed as providing transition of care (TOC) follow-up.  Patient has discharge instruction to follow-up withprimary care provider in 1- 2 weeks.   For additional questions please contact:  Edwena Felty A. Breawna Montenegro, BSN, RN-BC [facility] PRIMARY CARE Navigator Cell: [phone]

## 2018-03-14 DIAGNOSIS — K5900 Constipation, unspecified: Secondary | ICD-10-CM | POA: Diagnosis not present

## 2018-03-14 DIAGNOSIS — K219 Gastro-esophageal reflux disease without esophagitis: Secondary | ICD-10-CM | POA: Diagnosis not present

## 2018-03-14 DIAGNOSIS — M6281 Muscle weakness (generalized): Secondary | ICD-10-CM | POA: Diagnosis not present

## 2018-03-14 DIAGNOSIS — J45909 Unspecified asthma, uncomplicated: Secondary | ICD-10-CM | POA: Diagnosis not present

## 2018-03-14 DIAGNOSIS — M1993 Secondary osteoarthritis, unspecified site: Secondary | ICD-10-CM | POA: Diagnosis not present

## 2018-03-14 DIAGNOSIS — E119 Type 2 diabetes mellitus without complications: Secondary | ICD-10-CM | POA: Diagnosis not present

## 2018-03-14 DIAGNOSIS — I10 Essential (primary) hypertension: Secondary | ICD-10-CM | POA: Diagnosis not present

## 2018-03-14 DIAGNOSIS — M1380 Other specified arthritis, unspecified site: Secondary | ICD-10-CM | POA: Diagnosis not present

## 2018-03-14 DIAGNOSIS — R296 Repeated falls: Secondary | ICD-10-CM | POA: Diagnosis not present

## 2018-03-16 DIAGNOSIS — M6281 Muscle weakness (generalized): Secondary | ICD-10-CM | POA: Diagnosis not present

## 2018-03-16 DIAGNOSIS — R296 Repeated falls: Secondary | ICD-10-CM | POA: Diagnosis not present

## 2018-03-16 DIAGNOSIS — K219 Gastro-esophageal reflux disease without esophagitis: Secondary | ICD-10-CM | POA: Diagnosis not present

## 2018-03-16 DIAGNOSIS — K5900 Constipation, unspecified: Secondary | ICD-10-CM | POA: Diagnosis not present

## 2018-03-16 DIAGNOSIS — M1993 Secondary osteoarthritis, unspecified site: Secondary | ICD-10-CM | POA: Diagnosis not present

## 2018-03-16 DIAGNOSIS — I4891 Unspecified atrial fibrillation: Secondary | ICD-10-CM | POA: Diagnosis not present

## 2018-03-16 DIAGNOSIS — E119 Type 2 diabetes mellitus without complications: Secondary | ICD-10-CM | POA: Diagnosis not present

## 2018-03-16 DIAGNOSIS — J45909 Unspecified asthma, uncomplicated: Secondary | ICD-10-CM | POA: Diagnosis not present

## 2018-03-16 DIAGNOSIS — I10 Essential (primary) hypertension: Secondary | ICD-10-CM | POA: Diagnosis not present

## 2018-03-16 DIAGNOSIS — M1380 Other specified arthritis, unspecified site: Secondary | ICD-10-CM | POA: Diagnosis not present

## 2018-03-17 DIAGNOSIS — I10 Essential (primary) hypertension: Secondary | ICD-10-CM | POA: Diagnosis not present

## 2018-03-17 DIAGNOSIS — I4891 Unspecified atrial fibrillation: Secondary | ICD-10-CM | POA: Diagnosis not present

## 2018-03-17 DIAGNOSIS — K5900 Constipation, unspecified: Secondary | ICD-10-CM | POA: Diagnosis not present

## 2018-03-17 DIAGNOSIS — M6281 Muscle weakness (generalized): Secondary | ICD-10-CM | POA: Diagnosis not present

## 2018-03-17 DIAGNOSIS — K219 Gastro-esophageal reflux disease without esophagitis: Secondary | ICD-10-CM | POA: Diagnosis not present

## 2018-03-17 DIAGNOSIS — R296 Repeated falls: Secondary | ICD-10-CM | POA: Diagnosis not present

## 2018-03-17 DIAGNOSIS — N390 Urinary tract infection, site not specified: Secondary | ICD-10-CM | POA: Diagnosis not present

## 2018-03-17 DIAGNOSIS — J45909 Unspecified asthma, uncomplicated: Secondary | ICD-10-CM | POA: Diagnosis not present

## 2018-03-17 DIAGNOSIS — E119 Type 2 diabetes mellitus without complications: Secondary | ICD-10-CM | POA: Diagnosis not present

## 2018-03-21 DIAGNOSIS — N390 Urinary tract infection, site not specified: Secondary | ICD-10-CM | POA: Diagnosis not present

## 2018-03-21 DIAGNOSIS — R296 Repeated falls: Secondary | ICD-10-CM | POA: Diagnosis not present

## 2018-03-21 DIAGNOSIS — M6281 Muscle weakness (generalized): Secondary | ICD-10-CM | POA: Diagnosis not present

## 2018-03-21 DIAGNOSIS — K5900 Constipation, unspecified: Secondary | ICD-10-CM | POA: Diagnosis not present

## 2018-03-21 DIAGNOSIS — I10 Essential (primary) hypertension: Secondary | ICD-10-CM | POA: Diagnosis not present

## 2018-03-21 DIAGNOSIS — K219 Gastro-esophageal reflux disease without esophagitis: Secondary | ICD-10-CM | POA: Diagnosis not present

## 2018-03-21 DIAGNOSIS — J45909 Unspecified asthma, uncomplicated: Secondary | ICD-10-CM | POA: Diagnosis not present

## 2018-03-21 DIAGNOSIS — E119 Type 2 diabetes mellitus without complications: Secondary | ICD-10-CM | POA: Diagnosis not present

## 2018-03-21 DIAGNOSIS — I4891 Unspecified atrial fibrillation: Secondary | ICD-10-CM | POA: Diagnosis not present

## 2018-03-23 DIAGNOSIS — Z7901 Long term (current) use of anticoagulants: Secondary | ICD-10-CM | POA: Diagnosis not present

## 2018-03-23 DIAGNOSIS — I4891 Unspecified atrial fibrillation: Secondary | ICD-10-CM | POA: Diagnosis not present

## 2018-03-25 DIAGNOSIS — Z7901 Long term (current) use of anticoagulants: Secondary | ICD-10-CM | POA: Diagnosis not present

## 2018-03-25 DIAGNOSIS — I4891 Unspecified atrial fibrillation: Secondary | ICD-10-CM | POA: Diagnosis not present

## 2018-03-29 DIAGNOSIS — R296 Repeated falls: Secondary | ICD-10-CM | POA: Diagnosis not present

## 2018-03-29 DIAGNOSIS — E119 Type 2 diabetes mellitus without complications: Secondary | ICD-10-CM | POA: Diagnosis not present

## 2018-03-29 DIAGNOSIS — R2689 Other abnormalities of gait and mobility: Secondary | ICD-10-CM | POA: Diagnosis not present

## 2018-03-29 DIAGNOSIS — J45998 Other asthma: Secondary | ICD-10-CM | POA: Diagnosis not present

## 2018-03-29 DIAGNOSIS — Z7901 Long term (current) use of anticoagulants: Secondary | ICD-10-CM | POA: Diagnosis not present

## 2018-03-29 DIAGNOSIS — I10 Essential (primary) hypertension: Secondary | ICD-10-CM | POA: Diagnosis not present

## 2018-03-29 DIAGNOSIS — M6281 Muscle weakness (generalized): Secondary | ICD-10-CM | POA: Diagnosis not present

## 2018-03-29 DIAGNOSIS — I4891 Unspecified atrial fibrillation: Secondary | ICD-10-CM | POA: Diagnosis not present

## 2018-03-29 DIAGNOSIS — Z5181 Encounter for therapeutic drug level monitoring: Secondary | ICD-10-CM | POA: Diagnosis not present

## 2018-03-29 DIAGNOSIS — Z7984 Long term (current) use of oral hypoglycemic drugs: Secondary | ICD-10-CM | POA: Diagnosis not present

## 2018-03-30 DIAGNOSIS — M109 Gout, unspecified: Secondary | ICD-10-CM | POA: Diagnosis not present

## 2018-03-30 DIAGNOSIS — Z23 Encounter for immunization: Secondary | ICD-10-CM | POA: Diagnosis not present

## 2018-03-30 DIAGNOSIS — I509 Heart failure, unspecified: Secondary | ICD-10-CM | POA: Diagnosis not present

## 2018-03-30 DIAGNOSIS — M5136 Other intervertebral disc degeneration, lumbar region: Secondary | ICD-10-CM | POA: Diagnosis not present

## 2018-03-30 DIAGNOSIS — I4891 Unspecified atrial fibrillation: Secondary | ICD-10-CM | POA: Diagnosis not present

## 2018-03-30 DIAGNOSIS — Z993 Dependence on wheelchair: Secondary | ICD-10-CM | POA: Diagnosis not present

## 2018-03-30 DIAGNOSIS — R5381 Other malaise: Secondary | ICD-10-CM | POA: Diagnosis not present

## 2018-03-30 DIAGNOSIS — Z7901 Long term (current) use of anticoagulants: Secondary | ICD-10-CM | POA: Diagnosis not present

## 2018-03-31 DIAGNOSIS — E119 Type 2 diabetes mellitus without complications: Secondary | ICD-10-CM | POA: Diagnosis not present

## 2018-03-31 DIAGNOSIS — I4891 Unspecified atrial fibrillation: Secondary | ICD-10-CM | POA: Diagnosis not present

## 2018-03-31 DIAGNOSIS — M6281 Muscle weakness (generalized): Secondary | ICD-10-CM | POA: Diagnosis not present

## 2018-03-31 DIAGNOSIS — Z7984 Long term (current) use of oral hypoglycemic drugs: Secondary | ICD-10-CM | POA: Diagnosis not present

## 2018-03-31 DIAGNOSIS — I10 Essential (primary) hypertension: Secondary | ICD-10-CM | POA: Diagnosis not present

## 2018-03-31 DIAGNOSIS — R2689 Other abnormalities of gait and mobility: Secondary | ICD-10-CM | POA: Diagnosis not present

## 2018-04-04 DIAGNOSIS — I4891 Unspecified atrial fibrillation: Secondary | ICD-10-CM | POA: Diagnosis not present

## 2018-04-04 DIAGNOSIS — Z7984 Long term (current) use of oral hypoglycemic drugs: Secondary | ICD-10-CM | POA: Diagnosis not present

## 2018-04-04 DIAGNOSIS — E119 Type 2 diabetes mellitus without complications: Secondary | ICD-10-CM | POA: Diagnosis not present

## 2018-04-04 DIAGNOSIS — I10 Essential (primary) hypertension: Secondary | ICD-10-CM | POA: Diagnosis not present

## 2018-04-04 DIAGNOSIS — R2689 Other abnormalities of gait and mobility: Secondary | ICD-10-CM | POA: Diagnosis not present

## 2018-04-04 DIAGNOSIS — M6281 Muscle weakness (generalized): Secondary | ICD-10-CM | POA: Diagnosis not present

## 2018-04-06 DIAGNOSIS — R2689 Other abnormalities of gait and mobility: Secondary | ICD-10-CM | POA: Diagnosis not present

## 2018-04-06 DIAGNOSIS — Z7984 Long term (current) use of oral hypoglycemic drugs: Secondary | ICD-10-CM | POA: Diagnosis not present

## 2018-04-06 DIAGNOSIS — I10 Essential (primary) hypertension: Secondary | ICD-10-CM | POA: Diagnosis not present

## 2018-04-06 DIAGNOSIS — E119 Type 2 diabetes mellitus without complications: Secondary | ICD-10-CM | POA: Diagnosis not present

## 2018-04-06 DIAGNOSIS — I4891 Unspecified atrial fibrillation: Secondary | ICD-10-CM | POA: Diagnosis not present

## 2018-04-06 DIAGNOSIS — M6281 Muscle weakness (generalized): Secondary | ICD-10-CM | POA: Diagnosis not present

## 2018-04-07 DIAGNOSIS — I10 Essential (primary) hypertension: Secondary | ICD-10-CM | POA: Diagnosis not present

## 2018-04-07 DIAGNOSIS — R2689 Other abnormalities of gait and mobility: Secondary | ICD-10-CM | POA: Diagnosis not present

## 2018-04-07 DIAGNOSIS — Z7984 Long term (current) use of oral hypoglycemic drugs: Secondary | ICD-10-CM | POA: Diagnosis not present

## 2018-04-07 DIAGNOSIS — E119 Type 2 diabetes mellitus without complications: Secondary | ICD-10-CM | POA: Diagnosis not present

## 2018-04-07 DIAGNOSIS — I4891 Unspecified atrial fibrillation: Secondary | ICD-10-CM | POA: Diagnosis not present

## 2018-04-07 DIAGNOSIS — M6281 Muscle weakness (generalized): Secondary | ICD-10-CM | POA: Diagnosis not present

## 2018-04-11 DIAGNOSIS — I4891 Unspecified atrial fibrillation: Secondary | ICD-10-CM | POA: Diagnosis not present

## 2018-04-11 DIAGNOSIS — M6281 Muscle weakness (generalized): Secondary | ICD-10-CM | POA: Diagnosis not present

## 2018-04-11 DIAGNOSIS — E119 Type 2 diabetes mellitus without complications: Secondary | ICD-10-CM | POA: Diagnosis not present

## 2018-04-11 DIAGNOSIS — R2689 Other abnormalities of gait and mobility: Secondary | ICD-10-CM | POA: Diagnosis not present

## 2018-04-11 DIAGNOSIS — Z7984 Long term (current) use of oral hypoglycemic drugs: Secondary | ICD-10-CM | POA: Diagnosis not present

## 2018-04-11 DIAGNOSIS — I10 Essential (primary) hypertension: Secondary | ICD-10-CM | POA: Diagnosis not present

## 2018-04-12 DIAGNOSIS — I4891 Unspecified atrial fibrillation: Secondary | ICD-10-CM | POA: Diagnosis not present

## 2018-04-12 DIAGNOSIS — M6281 Muscle weakness (generalized): Secondary | ICD-10-CM | POA: Diagnosis not present

## 2018-04-12 DIAGNOSIS — R2689 Other abnormalities of gait and mobility: Secondary | ICD-10-CM | POA: Diagnosis not present

## 2018-04-12 DIAGNOSIS — Z7984 Long term (current) use of oral hypoglycemic drugs: Secondary | ICD-10-CM | POA: Diagnosis not present

## 2018-04-12 DIAGNOSIS — E119 Type 2 diabetes mellitus without complications: Secondary | ICD-10-CM | POA: Diagnosis not present

## 2018-04-12 DIAGNOSIS — I10 Essential (primary) hypertension: Secondary | ICD-10-CM | POA: Diagnosis not present

## 2018-04-13 DIAGNOSIS — M19041 Primary osteoarthritis, right hand: Secondary | ICD-10-CM | POA: Diagnosis not present

## 2018-04-13 DIAGNOSIS — G5602 Carpal tunnel syndrome, left upper limb: Secondary | ICD-10-CM | POA: Diagnosis not present

## 2018-04-13 DIAGNOSIS — M19042 Primary osteoarthritis, left hand: Secondary | ICD-10-CM | POA: Diagnosis not present

## 2018-04-13 DIAGNOSIS — M180 Bilateral primary osteoarthritis of first carpometacarpal joints: Secondary | ICD-10-CM | POA: Diagnosis not present

## 2018-04-14 DIAGNOSIS — E119 Type 2 diabetes mellitus without complications: Secondary | ICD-10-CM | POA: Diagnosis not present

## 2018-04-14 DIAGNOSIS — R2689 Other abnormalities of gait and mobility: Secondary | ICD-10-CM | POA: Diagnosis not present

## 2018-04-14 DIAGNOSIS — I10 Essential (primary) hypertension: Secondary | ICD-10-CM | POA: Diagnosis not present

## 2018-04-14 DIAGNOSIS — I4891 Unspecified atrial fibrillation: Secondary | ICD-10-CM | POA: Diagnosis not present

## 2018-04-14 DIAGNOSIS — Z7984 Long term (current) use of oral hypoglycemic drugs: Secondary | ICD-10-CM | POA: Diagnosis not present

## 2018-04-14 DIAGNOSIS — M6281 Muscle weakness (generalized): Secondary | ICD-10-CM | POA: Diagnosis not present

## 2018-04-15 DIAGNOSIS — Z7984 Long term (current) use of oral hypoglycemic drugs: Secondary | ICD-10-CM | POA: Diagnosis not present

## 2018-04-15 DIAGNOSIS — I4891 Unspecified atrial fibrillation: Secondary | ICD-10-CM | POA: Diagnosis not present

## 2018-04-15 DIAGNOSIS — R2689 Other abnormalities of gait and mobility: Secondary | ICD-10-CM | POA: Diagnosis not present

## 2018-04-15 DIAGNOSIS — M6281 Muscle weakness (generalized): Secondary | ICD-10-CM | POA: Diagnosis not present

## 2018-04-15 DIAGNOSIS — I10 Essential (primary) hypertension: Secondary | ICD-10-CM | POA: Diagnosis not present

## 2018-04-15 DIAGNOSIS — E119 Type 2 diabetes mellitus without complications: Secondary | ICD-10-CM | POA: Diagnosis not present

## 2018-04-20 DIAGNOSIS — M6281 Muscle weakness (generalized): Secondary | ICD-10-CM | POA: Diagnosis not present

## 2018-04-20 DIAGNOSIS — R2689 Other abnormalities of gait and mobility: Secondary | ICD-10-CM | POA: Diagnosis not present

## 2018-04-20 DIAGNOSIS — Z7984 Long term (current) use of oral hypoglycemic drugs: Secondary | ICD-10-CM | POA: Diagnosis not present

## 2018-04-20 DIAGNOSIS — E119 Type 2 diabetes mellitus without complications: Secondary | ICD-10-CM | POA: Diagnosis not present

## 2018-04-20 DIAGNOSIS — I10 Essential (primary) hypertension: Secondary | ICD-10-CM | POA: Diagnosis not present

## 2018-04-20 DIAGNOSIS — I4891 Unspecified atrial fibrillation: Secondary | ICD-10-CM | POA: Diagnosis not present

## 2018-04-21 DIAGNOSIS — R2689 Other abnormalities of gait and mobility: Secondary | ICD-10-CM | POA: Diagnosis not present

## 2018-04-21 DIAGNOSIS — M6281 Muscle weakness (generalized): Secondary | ICD-10-CM | POA: Diagnosis not present

## 2018-04-21 DIAGNOSIS — Z7984 Long term (current) use of oral hypoglycemic drugs: Secondary | ICD-10-CM | POA: Diagnosis not present

## 2018-04-21 DIAGNOSIS — I4891 Unspecified atrial fibrillation: Secondary | ICD-10-CM | POA: Diagnosis not present

## 2018-04-21 DIAGNOSIS — E119 Type 2 diabetes mellitus without complications: Secondary | ICD-10-CM | POA: Diagnosis not present

## 2018-04-21 DIAGNOSIS — I10 Essential (primary) hypertension: Secondary | ICD-10-CM | POA: Diagnosis not present

## 2018-04-22 DIAGNOSIS — Z7984 Long term (current) use of oral hypoglycemic drugs: Secondary | ICD-10-CM | POA: Diagnosis not present

## 2018-04-22 DIAGNOSIS — R2689 Other abnormalities of gait and mobility: Secondary | ICD-10-CM | POA: Diagnosis not present

## 2018-04-22 DIAGNOSIS — I4891 Unspecified atrial fibrillation: Secondary | ICD-10-CM | POA: Diagnosis not present

## 2018-04-22 DIAGNOSIS — E119 Type 2 diabetes mellitus without complications: Secondary | ICD-10-CM | POA: Diagnosis not present

## 2018-04-22 DIAGNOSIS — M6281 Muscle weakness (generalized): Secondary | ICD-10-CM | POA: Diagnosis not present

## 2018-04-22 DIAGNOSIS — I10 Essential (primary) hypertension: Secondary | ICD-10-CM | POA: Diagnosis not present

## 2018-04-27 DIAGNOSIS — R2689 Other abnormalities of gait and mobility: Secondary | ICD-10-CM | POA: Diagnosis not present

## 2018-04-27 DIAGNOSIS — E119 Type 2 diabetes mellitus without complications: Secondary | ICD-10-CM | POA: Diagnosis not present

## 2018-04-27 DIAGNOSIS — I10 Essential (primary) hypertension: Secondary | ICD-10-CM | POA: Diagnosis not present

## 2018-04-27 DIAGNOSIS — M6281 Muscle weakness (generalized): Secondary | ICD-10-CM | POA: Diagnosis not present

## 2018-04-27 DIAGNOSIS — I4891 Unspecified atrial fibrillation: Secondary | ICD-10-CM | POA: Diagnosis not present

## 2018-04-27 DIAGNOSIS — Z7984 Long term (current) use of oral hypoglycemic drugs: Secondary | ICD-10-CM | POA: Diagnosis not present

## 2018-05-03 DIAGNOSIS — I482 Chronic atrial fibrillation, unspecified: Secondary | ICD-10-CM | POA: Diagnosis not present

## 2018-05-03 DIAGNOSIS — Z7901 Long term (current) use of anticoagulants: Secondary | ICD-10-CM | POA: Diagnosis not present

## 2018-05-05 DIAGNOSIS — I4821 Permanent atrial fibrillation: Secondary | ICD-10-CM | POA: Diagnosis not present

## 2018-05-05 DIAGNOSIS — Z7901 Long term (current) use of anticoagulants: Secondary | ICD-10-CM | POA: Diagnosis not present

## 2018-05-11 DIAGNOSIS — E119 Type 2 diabetes mellitus without complications: Secondary | ICD-10-CM | POA: Diagnosis not present

## 2018-05-13 DIAGNOSIS — I10 Essential (primary) hypertension: Secondary | ICD-10-CM | POA: Diagnosis not present

## 2018-05-13 DIAGNOSIS — E118 Type 2 diabetes mellitus with unspecified complications: Secondary | ICD-10-CM | POA: Diagnosis not present

## 2018-05-13 DIAGNOSIS — M109 Gout, unspecified: Secondary | ICD-10-CM | POA: Diagnosis not present

## 2018-05-13 DIAGNOSIS — E7800 Pure hypercholesterolemia, unspecified: Secondary | ICD-10-CM | POA: Diagnosis not present

## 2018-05-17 DIAGNOSIS — Z1212 Encounter for screening for malignant neoplasm of rectum: Secondary | ICD-10-CM | POA: Diagnosis not present

## 2018-05-20 DIAGNOSIS — Z6830 Body mass index (BMI) 30.0-30.9, adult: Secondary | ICD-10-CM | POA: Diagnosis not present

## 2018-05-20 DIAGNOSIS — I509 Heart failure, unspecified: Secondary | ICD-10-CM | POA: Diagnosis not present

## 2018-05-20 DIAGNOSIS — E7800 Pure hypercholesterolemia, unspecified: Secondary | ICD-10-CM | POA: Diagnosis not present

## 2018-05-20 DIAGNOSIS — M5136 Other intervertebral disc degeneration, lumbar region: Secondary | ICD-10-CM | POA: Diagnosis not present

## 2018-05-20 DIAGNOSIS — R5381 Other malaise: Secondary | ICD-10-CM | POA: Diagnosis not present

## 2018-05-20 DIAGNOSIS — Z1389 Encounter for screening for other disorder: Secondary | ICD-10-CM | POA: Diagnosis not present

## 2018-05-20 DIAGNOSIS — Z0000 Encounter for general adult medical examination without abnormal findings: Secondary | ICD-10-CM | POA: Diagnosis not present

## 2018-05-20 DIAGNOSIS — E118 Type 2 diabetes mellitus with unspecified complications: Secondary | ICD-10-CM | POA: Diagnosis not present

## 2018-05-20 DIAGNOSIS — Z7901 Long term (current) use of anticoagulants: Secondary | ICD-10-CM | POA: Diagnosis not present

## 2018-05-20 DIAGNOSIS — I10 Essential (primary) hypertension: Secondary | ICD-10-CM | POA: Diagnosis not present

## 2018-05-20 DIAGNOSIS — I4821 Permanent atrial fibrillation: Secondary | ICD-10-CM | POA: Diagnosis not present

## 2018-05-20 DIAGNOSIS — Z993 Dependence on wheelchair: Secondary | ICD-10-CM | POA: Diagnosis not present

## 2018-05-20 DIAGNOSIS — R82998 Other abnormal findings in urine: Secondary | ICD-10-CM | POA: Diagnosis not present

## 2018-05-25 DIAGNOSIS — M180 Bilateral primary osteoarthritis of first carpometacarpal joints: Secondary | ICD-10-CM | POA: Diagnosis not present

## 2018-06-02 DIAGNOSIS — Z7901 Long term (current) use of anticoagulants: Secondary | ICD-10-CM | POA: Diagnosis not present

## 2018-06-02 DIAGNOSIS — I4821 Permanent atrial fibrillation: Secondary | ICD-10-CM | POA: Diagnosis not present

## 2018-07-07 DIAGNOSIS — I4821 Permanent atrial fibrillation: Secondary | ICD-10-CM | POA: Diagnosis not present

## 2018-07-07 DIAGNOSIS — Z7901 Long term (current) use of anticoagulants: Secondary | ICD-10-CM | POA: Diagnosis not present

## 2018-08-02 NOTE — Progress Notes (Signed)
Casey Patel Date of Birth: [birth year]   History of Present Illness: Casey Patel is seen  for followup of chronic atrial fibrillation. She has failed multiple attempts at cardioversion before. She is being managed with rate control and anticoagulation. On Toprol and Coumadin.   She was seen in the hospital in September 2019 following a mechanical fall. She had a fall and a UTI. Afib rate was increased. She was managed with metoprolol and addition of digoxin. Echo showed normal LV function with mild MR. Moderate pulmonary HTN and RV dysfunction. Digoxin was later discontinued.  On follow up today she is doing well. She denies any chest pain, palpitations, dyspnea or increased edema.  No bleeding on coumadin. INR has been therapeutic.  She has severe cervical kyphosis and is seen in a wheelchair.  Current Outpatient Medications on File Prior to Visit  Medication Sig Dispense Refill  . Cholecalciferol (VITAMIN D) 2000 UNITS CAPS Take 2,000 Units by mouth daily.    [name] docusate sodium (COLACE) 100 MG capsule Take 1 capsule (100 mg total) by mouth daily as needed for mild constipation. 10 capsule 0  . feeding supplement, GLUCERNA SHAKE, (GLUCERNA SHAKE) LIQD Take 237 mLs by mouth 3 (three) times daily between meals.  0  . fexofenadine (ALLEGRA) 180 MG tablet Take 180 mg by mouth daily at 6 PM.     . fluticasone furoate-vilanterol (BREO ELLIPTA) 100-25 MCG/INH AEPB Inhale 1 puff into the lungs at bedtime.    . furosemide (LASIX) 20 MG tablet Take 20 mg by mouth.    . metFORMIN (GLUCOPHAGE) 500 MG tablet Take 500 mg by mouth 2 (two) times daily with a meal.     . methocarbamol (ROBAXIN) 750 MG tablet Take 750 mg by mouth 3 (three) times daily.     . metoprolol (TOPROL-XL) 100 MG 24 hr tablet Take 150 mg by mouth at bedtime.     [name] omeprazole (PRILOSEC) 20 MG capsule Take 20 mg by mouth daily.    . traMADol (ULTRAM) 50 MG tablet Take 1 tablet (50 mg total) by mouth every 6 (six) hours as needed (pain).  12 tablet 0  . warfarin (COUMADIN) 5 MG tablet Take 2.5-5 mg by mouth See admin instructions. Take 1 tablet (5 mg) by mouth on Sunday and Thursday at 5pm, take 1/2 tablet (2.5 mg) on Monday, Tuesday, Wednesday, Friday, Saturday at 5pm     No current facility-administered medications on file prior to visit.     Allergies  Allergen Reactions  . Wheat Bran Shortness Of Breath  . Adhesive [Tape] Other (See Comments)    Blisters on skin  . Kiwi Extract Other (See Comments)    Causes mouth sores  . Orange Fruit [Citrus] Other (See Comments)    Causes mouth sores  . Pineapple Other (See Comments)    Causes mouth sores  . Promethazine Hcl Other (See Comments)    Disorientation   . Tomato Other (See Comments)    Causes mouth sores  . Tylenol [Acetaminophen] Other (See Comments)    Causes fast heart rate  . Vistaril [Hydroxyzine Hcl] Other (See Comments)    confusion  . Codeine Itching and Rash    Past Medical History:  Diagnosis Date  . AF (atrial fibrillation) ([place])   . Arthritis   . Asthma   . Diabetes mellitus    TYPE 2  . Hyperlipidemia   . Hypertension   . PNA (pneumonia)     Past Surgical History:  Procedure Laterality  Date  . ABDOMINAL HYSTERECTOMY  1976  . BACK SURGERY     lumb x 3  . CARDIOVERSION  12/07/2011   Procedure: CARDIOVERSION;  Surgeon: Peter M Martinique, MD;  Location: [facility];  Service: Cardiovascular;  Laterality: N/A;  . CARPAL TUNNEL RELEASE  09/15/2011   Procedure: CARPAL TUNNEL RELEASE;  Surgeon: Wynonia Sours, MD;  Location: [facility];  Service: Orthopedics;  Laterality: Right;  . CATARACT EXTRACTION    . CHOLECYSTECTOMY  2003  . HAND TENDON SURGERY  1999   left wrist - thumb  . LAMINECTOMY  1999   with fusion (L1-5)  . LAMINECTOMY  [id number]   with fusion (L3-4)  . TONSILLECTOMY  1952    Social History   Tobacco Use  Smoking Status Former Smoker  . Packs/day: 0.80  . Years: 20.00  . Pack years: 16.00  . Types: Cigarettes  .  Last attempt to quit: 06/29/1978  . Years since quitting: 40.1  Smokeless Tobacco Never Used    Social History   Substance and Sexual Activity  Alcohol Use No    Family History  Problem Relation Age of Onset  . Hypertension Mother 9  . Stroke Mother 46  . Arthritis Mother 49  . Heart attack Father 86  . Heart disease Father 38  . Hypertension Father 44  . Ulcers Father 20  . Breast cancer Sister   . Heart disease Brother        CONGENTIAL HEART DISEASE  . Heart disease Sister   . Diabetes Sister   . Breast cancer Sister     Review of Systems:  As noted in history of present illness. All other systems were reviewed and are negative.  Physical Exam: BP 134/76   Pulse (!) 54   Ht 5\' 6"  (1.676 m)   Wt 196 lb (88.9 kg)   SpO2 96%   BMI 31.64 kg/m  GENERAL:  Well appearing, obese WF in wheelchair. HEENT:  PERRL, EOMI, sclera are clear. Oropharynx is clear. NECK:  No jugular venous distention, carotid upstroke brisk and symmetric, no bruits, no thyromegaly or adenopathy. Marked cervical kyphosis.  LUNGS:  Clear to auscultation bilaterally CHEST:  Unremarkable HEART:  RRR,  PMI not displaced or sustained,S1 and S2 within normal limits, no S3, no S4: no clicks, no rubs, no murmurs ABD:  Soft, nontender. BS +, no masses or bruits. No hepatomegaly, no splenomegaly EXT:  2 + pulses throughout, no edema, no cyanosis no clubbing SKIN:  Warm and dry.  No rashes NEURO:  Alert and oriented x 3. Cranial nerves II through XII intact. PSYCH:  Cognitively intact   LABORATORY DATA: Lab Results  Component Value Date   WBC 6.3 03/10/2018   HGB 10.5 (L) 03/10/2018   HCT 32.6 (L) 03/10/2018   PLT 313 03/10/2018   GLUCOSE 120 (H) 03/10/2018   ALT 17 03/09/2018   AST 17 03/09/2018   NA 140 03/10/2018   K 4.0 03/10/2018   CL 106 03/10/2018   CREATININE 0.60 03/10/2018   BUN 9 03/10/2018   CO2 27 03/10/2018   TSH 0.847 03/07/2018   INR 2.09 03/11/2018   HGBA1C 6.0 (H)  03/07/2018   Labs dated 05/07/16: cholesterol 183, triglycerides, 161, HDL 56, LDL 95. A1c 5.6%. CMET normal. Dated 05/24/17: cholesterol 171, triglycerides 101, HDL 58, LDL 93. A1c 6%. Hbg 10.2. Chemistries normal. Dated 05/13/18: cholesterol 174, triglycerides 79, HDL 60, LDL 98. A1c 5.6%. CBC, CMET and TSH normal.  Echocardiogram 03/08/18: Study Conclusions  - Left ventricle: The cavity size was normal. Wall thickness was normal. Systolic function was normal. The estimated ejection fraction was in the range of 55% to 60%. Wall motion was normal; there were no regional wall motion abnormalities. - Mitral valve: There was mild regurgitation. - Left atrium: The atrium was moderately dilated. - Right ventricle: Systolic function was mildly to moderately reduced. - Right atrium: The atrium was moderately dilated. - Tricuspid valve: There was mild-moderate regurgitation directed centrally. - Pulmonary arteries: PA peak pressure: 55 mm Hg (S).  Assessment / Plan: 1. Atrial fibrillation, permanent. Rate is well controlled on Toprol  and she is anticoagulated appropriately with Coumadin. Will continue current therapy 2. HTN controlled. 3. Pulmonary HTN on Echo. Asymptomatic. No edema. Oxygen levels normal.

## 2018-08-08 ENCOUNTER — Ambulatory Visit (INDEPENDENT_AMBULATORY_CARE_PROVIDER_SITE_OTHER): Payer: Medicare Other | Admitting: Cardiology

## 2018-08-08 ENCOUNTER — Encounter: Payer: Self-pay | Admitting: Cardiology

## 2018-08-08 VITALS — BP 134/76 | HR 54 | Ht 66.0 in | Wt 196.0 lb

## 2018-08-08 DIAGNOSIS — I10 Essential (primary) hypertension: Secondary | ICD-10-CM | POA: Diagnosis not present

## 2018-08-08 DIAGNOSIS — I482 Chronic atrial fibrillation, unspecified: Secondary | ICD-10-CM

## 2018-08-11 DIAGNOSIS — Z7901 Long term (current) use of anticoagulants: Secondary | ICD-10-CM | POA: Diagnosis not present

## 2018-08-11 DIAGNOSIS — I4821 Permanent atrial fibrillation: Secondary | ICD-10-CM | POA: Diagnosis not present

## 2018-09-13 DIAGNOSIS — Z7901 Long term (current) use of anticoagulants: Secondary | ICD-10-CM | POA: Diagnosis not present

## 2018-09-13 DIAGNOSIS — I4821 Permanent atrial fibrillation: Secondary | ICD-10-CM | POA: Diagnosis not present

## 2018-10-13 DIAGNOSIS — Z7901 Long term (current) use of anticoagulants: Secondary | ICD-10-CM | POA: Diagnosis not present

## 2018-10-13 DIAGNOSIS — I4821 Permanent atrial fibrillation: Secondary | ICD-10-CM | POA: Diagnosis not present

## 2018-11-16 DIAGNOSIS — I4821 Permanent atrial fibrillation: Secondary | ICD-10-CM | POA: Diagnosis not present

## 2018-11-16 DIAGNOSIS — Z7901 Long term (current) use of anticoagulants: Secondary | ICD-10-CM | POA: Diagnosis not present

## 2018-12-01 DIAGNOSIS — Z7901 Long term (current) use of anticoagulants: Secondary | ICD-10-CM | POA: Diagnosis not present

## 2018-12-01 DIAGNOSIS — I4821 Permanent atrial fibrillation: Secondary | ICD-10-CM | POA: Diagnosis not present

## 2018-12-21 DIAGNOSIS — Z7901 Long term (current) use of anticoagulants: Secondary | ICD-10-CM | POA: Diagnosis not present

## 2018-12-21 DIAGNOSIS — I4821 Permanent atrial fibrillation: Secondary | ICD-10-CM | POA: Diagnosis not present

## 2018-12-22 DIAGNOSIS — J4540 Moderate persistent asthma, uncomplicated: Secondary | ICD-10-CM | POA: Diagnosis not present

## 2018-12-22 DIAGNOSIS — E7800 Pure hypercholesterolemia, unspecified: Secondary | ICD-10-CM | POA: Diagnosis not present

## 2018-12-22 DIAGNOSIS — I4821 Permanent atrial fibrillation: Secondary | ICD-10-CM | POA: Diagnosis not present

## 2018-12-22 DIAGNOSIS — M5136 Other intervertebral disc degeneration, lumbar region: Secondary | ICD-10-CM | POA: Diagnosis not present

## 2018-12-22 DIAGNOSIS — E118 Type 2 diabetes mellitus with unspecified complications: Secondary | ICD-10-CM | POA: Diagnosis not present

## 2018-12-22 DIAGNOSIS — M1990 Unspecified osteoarthritis, unspecified site: Secondary | ICD-10-CM | POA: Diagnosis not present

## 2018-12-22 DIAGNOSIS — M109 Gout, unspecified: Secondary | ICD-10-CM | POA: Diagnosis not present

## 2018-12-22 DIAGNOSIS — I509 Heart failure, unspecified: Secondary | ICD-10-CM | POA: Diagnosis not present

## 2018-12-22 DIAGNOSIS — I10 Essential (primary) hypertension: Secondary | ICD-10-CM | POA: Diagnosis not present

## 2018-12-22 DIAGNOSIS — Z993 Dependence on wheelchair: Secondary | ICD-10-CM | POA: Diagnosis not present

## 2019-01-25 DIAGNOSIS — Z7901 Long term (current) use of anticoagulants: Secondary | ICD-10-CM | POA: Diagnosis not present

## 2019-01-25 DIAGNOSIS — I4821 Permanent atrial fibrillation: Secondary | ICD-10-CM | POA: Diagnosis not present

## 2019-02-23 DIAGNOSIS — Z7901 Long term (current) use of anticoagulants: Secondary | ICD-10-CM | POA: Diagnosis not present

## 2019-02-23 DIAGNOSIS — I4821 Permanent atrial fibrillation: Secondary | ICD-10-CM | POA: Diagnosis not present

## 2019-02-23 DIAGNOSIS — Z23 Encounter for immunization: Secondary | ICD-10-CM | POA: Diagnosis not present

## 2019-03-30 DIAGNOSIS — Z7901 Long term (current) use of anticoagulants: Secondary | ICD-10-CM | POA: Diagnosis not present

## 2019-03-30 DIAGNOSIS — I4821 Permanent atrial fibrillation: Secondary | ICD-10-CM | POA: Diagnosis not present

## 2019-04-10 DIAGNOSIS — L820 Inflamed seborrheic keratosis: Secondary | ICD-10-CM | POA: Diagnosis not present

## 2019-04-10 DIAGNOSIS — L821 Other seborrheic keratosis: Secondary | ICD-10-CM | POA: Diagnosis not present

## 2019-04-10 DIAGNOSIS — L304 Erythema intertrigo: Secondary | ICD-10-CM | POA: Diagnosis not present

## 2019-05-03 DIAGNOSIS — Z7901 Long term (current) use of anticoagulants: Secondary | ICD-10-CM | POA: Diagnosis not present

## 2019-05-03 DIAGNOSIS — I4821 Permanent atrial fibrillation: Secondary | ICD-10-CM | POA: Diagnosis not present

## 2019-05-15 ENCOUNTER — Other Ambulatory Visit: Payer: Self-pay

## 2019-05-15 DIAGNOSIS — Z20828 Contact with and (suspected) exposure to other viral communicable diseases: Secondary | ICD-10-CM | POA: Diagnosis not present

## 2019-05-15 DIAGNOSIS — Z20822 Contact with and (suspected) exposure to covid-19: Secondary | ICD-10-CM

## 2019-05-18 LAB — NOVEL CORONAVIRUS, NAA: SARS-CoV-2, NAA: DETECTED — AB

## 2019-05-29 ENCOUNTER — Other Ambulatory Visit: Payer: Self-pay

## 2019-05-29 DIAGNOSIS — Z20828 Contact with and (suspected) exposure to other viral communicable diseases: Secondary | ICD-10-CM | POA: Diagnosis not present

## 2019-05-29 DIAGNOSIS — Z20822 Contact with and (suspected) exposure to covid-19: Secondary | ICD-10-CM

## 2019-05-30 LAB — NOVEL CORONAVIRUS, NAA: SARS-CoV-2, NAA: DETECTED — AB

## 2019-06-05 DIAGNOSIS — M109 Gout, unspecified: Secondary | ICD-10-CM | POA: Diagnosis not present

## 2019-06-05 DIAGNOSIS — E118 Type 2 diabetes mellitus with unspecified complications: Secondary | ICD-10-CM | POA: Diagnosis not present

## 2019-06-05 DIAGNOSIS — E7800 Pure hypercholesterolemia, unspecified: Secondary | ICD-10-CM | POA: Diagnosis not present

## 2019-06-07 DIAGNOSIS — R82998 Other abnormal findings in urine: Secondary | ICD-10-CM | POA: Diagnosis not present

## 2019-06-21 DIAGNOSIS — Z1212 Encounter for screening for malignant neoplasm of rectum: Secondary | ICD-10-CM | POA: Diagnosis not present

## 2019-07-20 DIAGNOSIS — I4821 Permanent atrial fibrillation: Secondary | ICD-10-CM | POA: Diagnosis not present

## 2019-07-20 DIAGNOSIS — Z7901 Long term (current) use of anticoagulants: Secondary | ICD-10-CM | POA: Diagnosis not present

## 2019-08-03 ENCOUNTER — Telehealth: Payer: Self-pay | Admitting: Cardiology

## 2019-08-03 NOTE — Telephone Encounter (Signed)
Janalyn Shy, patient's caregiver, is requesting to accompany the patient during her appointment on  08/08/19 at 1:20 PM due to the patient being in a wheelchair. Please call to advise.

## 2019-08-03 NOTE — Telephone Encounter (Signed)
DPr on file. Spoke with Janalyn Shy. Informed her that pt may be brought up to the office via wheelchair and then pt can be assisted back to exam room for appt while she waits for pt in car or wherever she chooses. Also informed her that triage nurse will put in a note for appt so that she may be contacted during the appt and participate. She is agreeable and provided contact number: [phone]

## 2019-08-04 NOTE — Telephone Encounter (Signed)
Spoke to patient advised ok for your caregiver Janalyn Shy to come back at your appointment with Dr.Jordan 08/08/19.

## 2019-08-06 NOTE — Progress Notes (Addendum)
Casey Patel Date of Birth: [birth year]   History of Present Illness: Casey Patel is seen  for followup of chronic atrial fibrillation. She has failed multiple attempts at cardioversion before. She is being managed with rate control and anticoagulation. On Toprol and Coumadin.   She was seen in the hospital in September 2019 following a mechanical fall. She had a fall and a UTI. Afib rate was increased. She was managed with metoprolol and addition of digoxin. Echo showed normal LV function with mild MR. Moderate pulmonary HTN and RV dysfunction. Digoxin was later discontinued.  On follow up today she is doing well. She denies any chest pain, palpitations, dyspnea or increased edema.  No bleeding on coumadin.   She has severe cervical kyphosis and is seen in a wheelchair. Energy level is good and she has no dizziness.   Current Outpatient Medications on File Prior to Visit  Medication Sig Dispense Refill  . Cholecalciferol (VITAMIN D) 2000 UNITS CAPS Take 2,000 Units by mouth daily.    [name] docusate sodium (COLACE) 100 MG capsule Take 1 capsule (100 mg total) by mouth daily as needed for mild constipation. 10 capsule 0  . fexofenadine (ALLEGRA) 180 MG tablet Take 180 mg by mouth daily at 6 PM.     . fluticasone furoate-vilanterol (BREO ELLIPTA) 100-25 MCG/INH AEPB Inhale 1 puff into the lungs at bedtime.    . furosemide (LASIX) 20 MG tablet Take 20 mg by mouth.    . metFORMIN (GLUCOPHAGE) 500 MG tablet Take 500 mg by mouth 2 (two) times daily with a meal.     . methocarbamol (ROBAXIN) 750 MG tablet Take 750 mg by mouth 3 (three) times daily.     . metoprolol (TOPROL-XL) 100 MG 24 hr tablet Take 150 mg by mouth at bedtime.     . Nutritional Supplements (GLUCERNA ADVANCE SHAKE) LIQD Take 1 shake twice a week    . omeprazole (PRILOSEC) 20 MG capsule Take 20 mg by mouth daily.    . traMADol (ULTRAM) 50 MG tablet Take 1 tablet (50 mg total) by mouth every 6 (six) hours as needed (pain). 12 tablet 0   . warfarin (COUMADIN) 5 MG tablet Take 2.5-5 mg by mouth See admin instructions. Take 1 tablet (5 mg) by mouth on Sunday and Thursday at 5pm, take 1/2 tablet (2.5 mg) on Monday, Tuesday, Wednesday, Friday, Saturday at 5pm     No current facility-administered medications on file prior to visit.    Allergies  Allergen Reactions  . Wheat Bran Shortness Of Breath  . Adhesive [Tape] Other (See Comments)    Blisters on skin  . Kiwi Extract Other (See Comments)    Causes mouth sores  . Orange Fruit [Citrus] Other (See Comments)    Causes mouth sores  . Pineapple Other (See Comments)    Causes mouth sores  . Promethazine Hcl Other (See Comments)    Disorientation   . Tomato Other (See Comments)    Causes mouth sores  . Tylenol [Acetaminophen] Other (See Comments)    Causes fast heart rate  . Vistaril [Hydroxyzine Hcl] Other (See Comments)    confusion  . Codeine Itching and Rash    Past Medical History:  Diagnosis Date  . AF (atrial fibrillation) ([place])   . Arthritis   . Asthma   . Diabetes mellitus    TYPE 2  . Hyperlipidemia   . Hypertension   . PNA (pneumonia)     Past Surgical History:  Procedure Laterality  Date  . ABDOMINAL HYSTERECTOMY  1976  . BACK SURGERY     lumb x 3  . CARDIOVERSION  12/07/2011   Procedure: CARDIOVERSION;  Surgeon: Rosetta Rupnow M Martinique, MD;  Location: [facility];  Service: Cardiovascular;  Laterality: N/A;  . CARPAL TUNNEL RELEASE  09/15/2011   Procedure: CARPAL TUNNEL RELEASE;  Surgeon: Wynonia Sours, MD;  Location: [facility];  Service: Orthopedics;  Laterality: Right;  . CATARACT EXTRACTION    . CHOLECYSTECTOMY  2003  . HAND TENDON SURGERY  1999   left wrist - thumb  . LAMINECTOMY  1999   with fusion (L1-5)  . LAMINECTOMY  [id number]   with fusion (L3-4)  . TONSILLECTOMY  1952    Social History   Tobacco Use  Smoking Status Former Smoker  . Packs/day: 0.80  . Years: 20.00  . Pack years: 16.00  . Types: Cigarettes  . Quit date:  06/29/1978  . Years since quitting: 41.1  Smokeless Tobacco Never Used    Social History   Substance and Sexual Activity  Alcohol Use No    Family History  Problem Relation Age of Onset  . Hypertension Mother 69  . Stroke Mother 22  . Arthritis Mother 31  . Heart attack Father 62  . Heart disease Father 85  . Hypertension Father 26  . Ulcers Father 72  . Breast cancer Sister   . Heart disease Brother        CONGENTIAL HEART DISEASE  . Heart disease Sister   . Diabetes Sister   . Breast cancer Sister     Review of Systems:  As noted in history of present illness. All other systems were reviewed and are negative.  Physical Exam: BP (!) 96/58   Pulse 92   Ht 5\' 5"  (1.651 m)   Wt 186 lb 4 oz (84.5 kg)   BMI 30.99 kg/m  GENERAL:  Well appearing, obese WF in wheelchair. HEENT:  PERRL, EOMI, sclera are clear. Oropharynx is clear. NECK:  No jugular venous distention, carotid upstroke brisk and symmetric, no bruits, no thyromegaly or adenopathy. Marked cervical kyphosis.  LUNGS:  Clear to auscultation bilaterally CHEST:  Unremarkable HEART:  IRRR,  PMI not displaced or sustained,S1 and S2 within normal limits, no S3, no S4: no clicks, no rubs, no murmurs Spine: kyphoscoliosis.  ABD:  Soft, nontender. BS +, no masses or bruits. No hepatomegaly, no splenomegaly EXT:  2 + pulses throughout, no edema, no cyanosis no clubbing SKIN:  Warm and dry.  No rashes NEURO:  Alert and oriented x 3. Cranial nerves II through XII intact. PSYCH:  Cognitively intact   LABORATORY DATA: Lab Results  Component Value Date   WBC 6.3 03/10/2018   HGB 10.5 (L) 03/10/2018   HCT 32.6 (L) 03/10/2018   PLT 313 03/10/2018   GLUCOSE 120 (H) 03/10/2018   ALT 17 03/09/2018   AST 17 03/09/2018   NA 140 03/10/2018   K 4.0 03/10/2018   CL 106 03/10/2018   CREATININE 0.60 03/10/2018   BUN 9 03/10/2018   CO2 27 03/10/2018   TSH 0.847 03/07/2018   INR 2.09 03/11/2018   HGBA1C 6.0 (H) 03/07/2018    Labs dated 05/07/16: cholesterol 183, triglycerides, 161, HDL 56, LDL 95. A1c 5.6%. CMET normal. Dated 05/24/17: cholesterol 171, triglycerides 101, HDL 58, LDL 93. A1c 6%. Hbg 10.2. Chemistries normal. Dated 05/13/18: cholesterol 174, triglycerides 79, HDL 60, LDL 98. A1c 5.6%. CBC, CMET and TSH normal.  Dated 06/06/19:  cholesterol 155, triglycerides 103, HDL 58, LDL 76. A1c 6.2%. CBC and CMET normal  Ecg today shows Afib with rate 92. Low voltage. Nonspecific ST-T abnormality. I have personally reviewed and interpreted this study.   Echocardiogram 03/08/18: Study Conclusions  - Left ventricle: The cavity size was normal. Wall thickness was normal. Systolic function was normal. The estimated ejection fraction was in the range of 55% to 60%. Wall motion was normal; there were no regional wall motion abnormalities. - Mitral valve: There was mild regurgitation. - Left atrium: The atrium was moderately dilated. - Right ventricle: Systolic function was mildly to moderately reduced. - Right atrium: The atrium was moderately dilated. - Tricuspid valve: There was mild-moderate regurgitation directed centrally. - Pulmonary arteries: PA peak pressure: 55 mm Hg (S).  Assessment / Plan: 1. Atrial fibrillation, permanent. Rate is well controlled on Toprol  and she is anticoagulated appropriately with Coumadin. Will continue current therapy 2. HTN controlled. 3. Pulmonary HTN on Echo. Asymptomatic.   Follow up in one year

## 2019-08-08 ENCOUNTER — Other Ambulatory Visit: Payer: Self-pay

## 2019-08-08 ENCOUNTER — Encounter: Payer: Self-pay | Admitting: Cardiology

## 2019-08-08 ENCOUNTER — Encounter (INDEPENDENT_AMBULATORY_CARE_PROVIDER_SITE_OTHER): Payer: Self-pay

## 2019-08-08 ENCOUNTER — Ambulatory Visit (INDEPENDENT_AMBULATORY_CARE_PROVIDER_SITE_OTHER): Payer: Medicare Other | Admitting: Cardiology

## 2019-08-08 VITALS — BP 96/58 | HR 92 | Ht 65.0 in | Wt 186.2 lb

## 2019-08-08 DIAGNOSIS — I482 Chronic atrial fibrillation, unspecified: Secondary | ICD-10-CM | POA: Diagnosis not present

## 2019-08-08 DIAGNOSIS — I10 Essential (primary) hypertension: Secondary | ICD-10-CM | POA: Diagnosis not present

## 2019-08-23 DIAGNOSIS — Z23 Encounter for immunization: Secondary | ICD-10-CM | POA: Diagnosis not present

## 2019-09-06 DIAGNOSIS — Z7901 Long term (current) use of anticoagulants: Secondary | ICD-10-CM | POA: Diagnosis not present

## 2019-09-06 DIAGNOSIS — I4821 Permanent atrial fibrillation: Secondary | ICD-10-CM | POA: Diagnosis not present

## 2019-09-20 DIAGNOSIS — Z23 Encounter for immunization: Secondary | ICD-10-CM | POA: Diagnosis not present

## 2019-09-21 DIAGNOSIS — E119 Type 2 diabetes mellitus without complications: Secondary | ICD-10-CM | POA: Diagnosis not present

## 2019-10-18 DIAGNOSIS — Z7901 Long term (current) use of anticoagulants: Secondary | ICD-10-CM | POA: Diagnosis not present

## 2019-10-18 DIAGNOSIS — I4821 Permanent atrial fibrillation: Secondary | ICD-10-CM | POA: Diagnosis not present

## 2019-12-07 DIAGNOSIS — I4821 Permanent atrial fibrillation: Secondary | ICD-10-CM | POA: Diagnosis not present

## 2019-12-07 DIAGNOSIS — R5381 Other malaise: Secondary | ICD-10-CM | POA: Diagnosis not present

## 2019-12-07 DIAGNOSIS — E7800 Pure hypercholesterolemia, unspecified: Secondary | ICD-10-CM | POA: Diagnosis not present

## 2019-12-07 DIAGNOSIS — Z9981 Dependence on supplemental oxygen: Secondary | ICD-10-CM | POA: Diagnosis not present

## 2019-12-07 DIAGNOSIS — J4540 Moderate persistent asthma, uncomplicated: Secondary | ICD-10-CM | POA: Diagnosis not present

## 2019-12-07 DIAGNOSIS — Z993 Dependence on wheelchair: Secondary | ICD-10-CM | POA: Diagnosis not present

## 2019-12-07 DIAGNOSIS — Z7901 Long term (current) use of anticoagulants: Secondary | ICD-10-CM | POA: Diagnosis not present

## 2019-12-07 DIAGNOSIS — Z8616 Personal history of COVID-19: Secondary | ICD-10-CM | POA: Diagnosis not present

## 2019-12-07 DIAGNOSIS — I10 Essential (primary) hypertension: Secondary | ICD-10-CM | POA: Diagnosis not present

## 2019-12-07 DIAGNOSIS — Z1331 Encounter for screening for depression: Secondary | ICD-10-CM | POA: Diagnosis not present

## 2019-12-07 DIAGNOSIS — E118 Type 2 diabetes mellitus with unspecified complications: Secondary | ICD-10-CM | POA: Diagnosis not present

## 2020-01-02 ENCOUNTER — Other Ambulatory Visit: Payer: Self-pay | Admitting: Internal Medicine

## 2020-01-02 DIAGNOSIS — Z1231 Encounter for screening mammogram for malignant neoplasm of breast: Secondary | ICD-10-CM

## 2020-01-17 DIAGNOSIS — Z7901 Long term (current) use of anticoagulants: Secondary | ICD-10-CM | POA: Diagnosis not present

## 2020-01-17 DIAGNOSIS — I4821 Permanent atrial fibrillation: Secondary | ICD-10-CM | POA: Diagnosis not present

## 2020-01-23 ENCOUNTER — Ambulatory Visit
Admission: RE | Admit: 2020-01-23 | Discharge: 2020-01-23 | Disposition: A | Discharge status: Home / Self Care | Payer: Medicare Other | Source: Ambulatory Visit | Attending: Internal Medicine | Admitting: Internal Medicine

## 2020-01-23 ENCOUNTER — Other Ambulatory Visit: Payer: Self-pay

## 2020-01-23 DIAGNOSIS — Z1231 Encounter for screening mammogram for malignant neoplasm of breast: Secondary | ICD-10-CM

## 2020-02-14 IMAGING — DX DG HAND 2V*R*
2 series · 2 of 2 positions shown · non-contrast
Comparison: None

CLINICAL DATA: RIGHT hand pain, arthritis, no injury

EXAM:
RIGHT HAND - 2 VIEW

[hand pa]
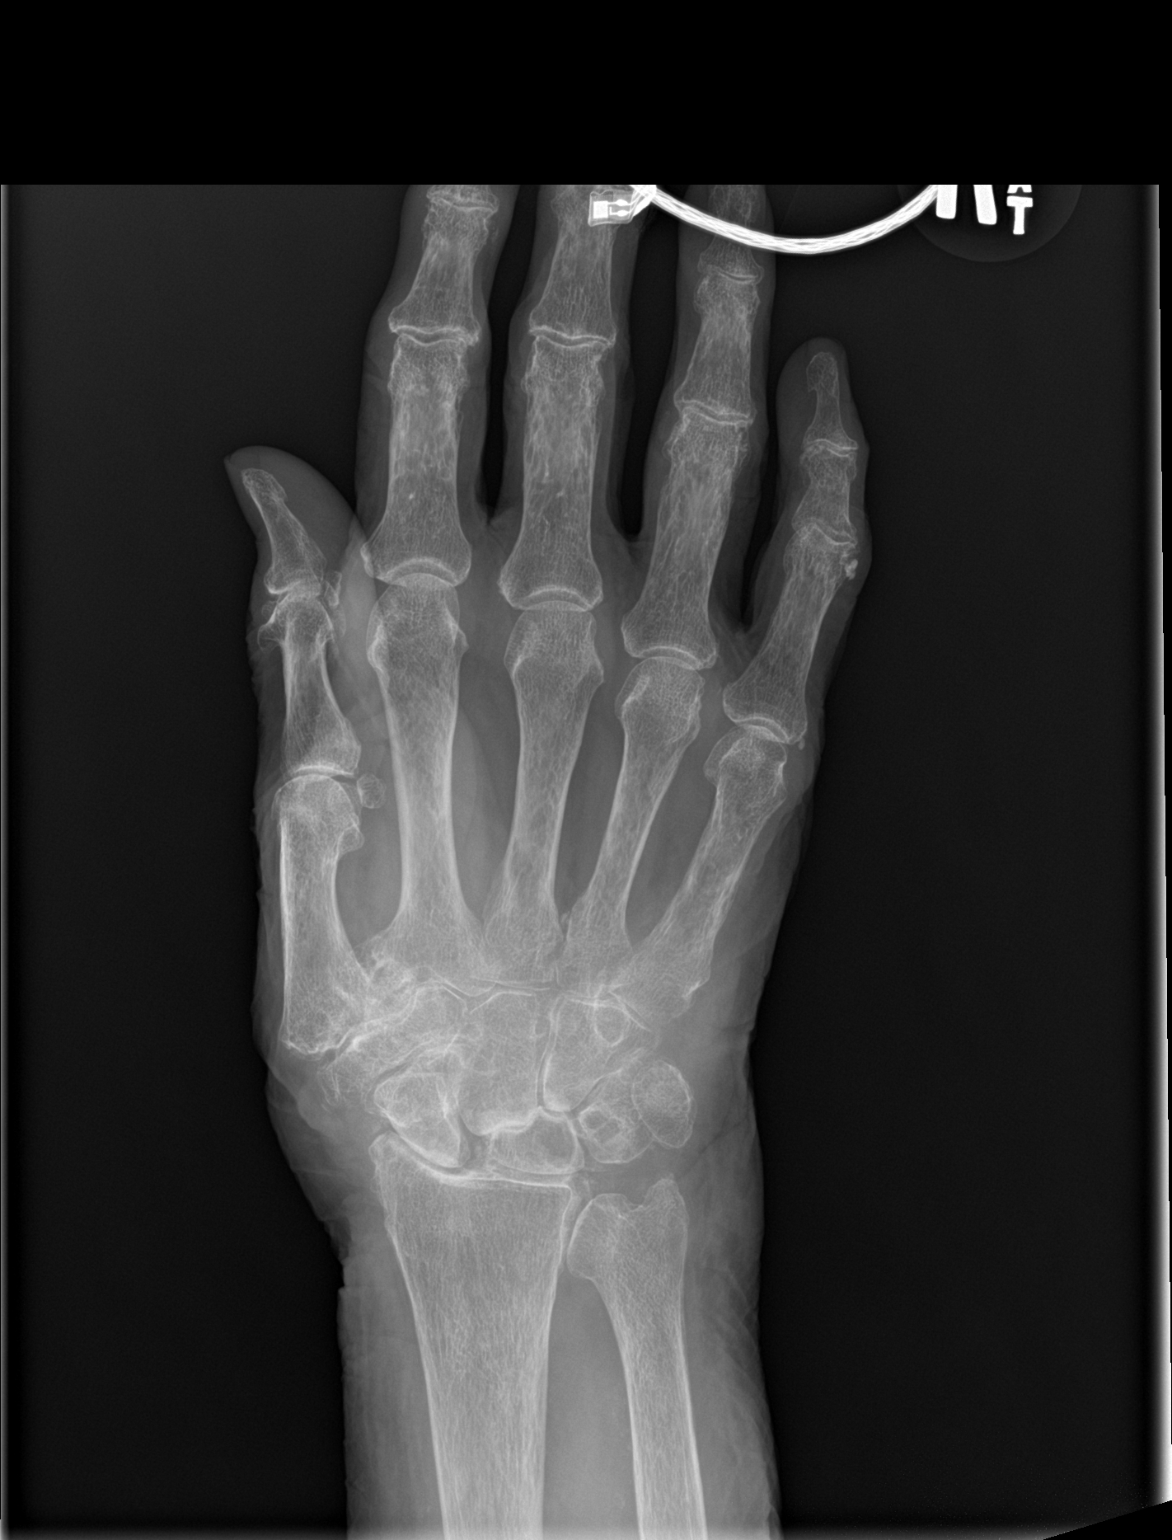

[hand lat]
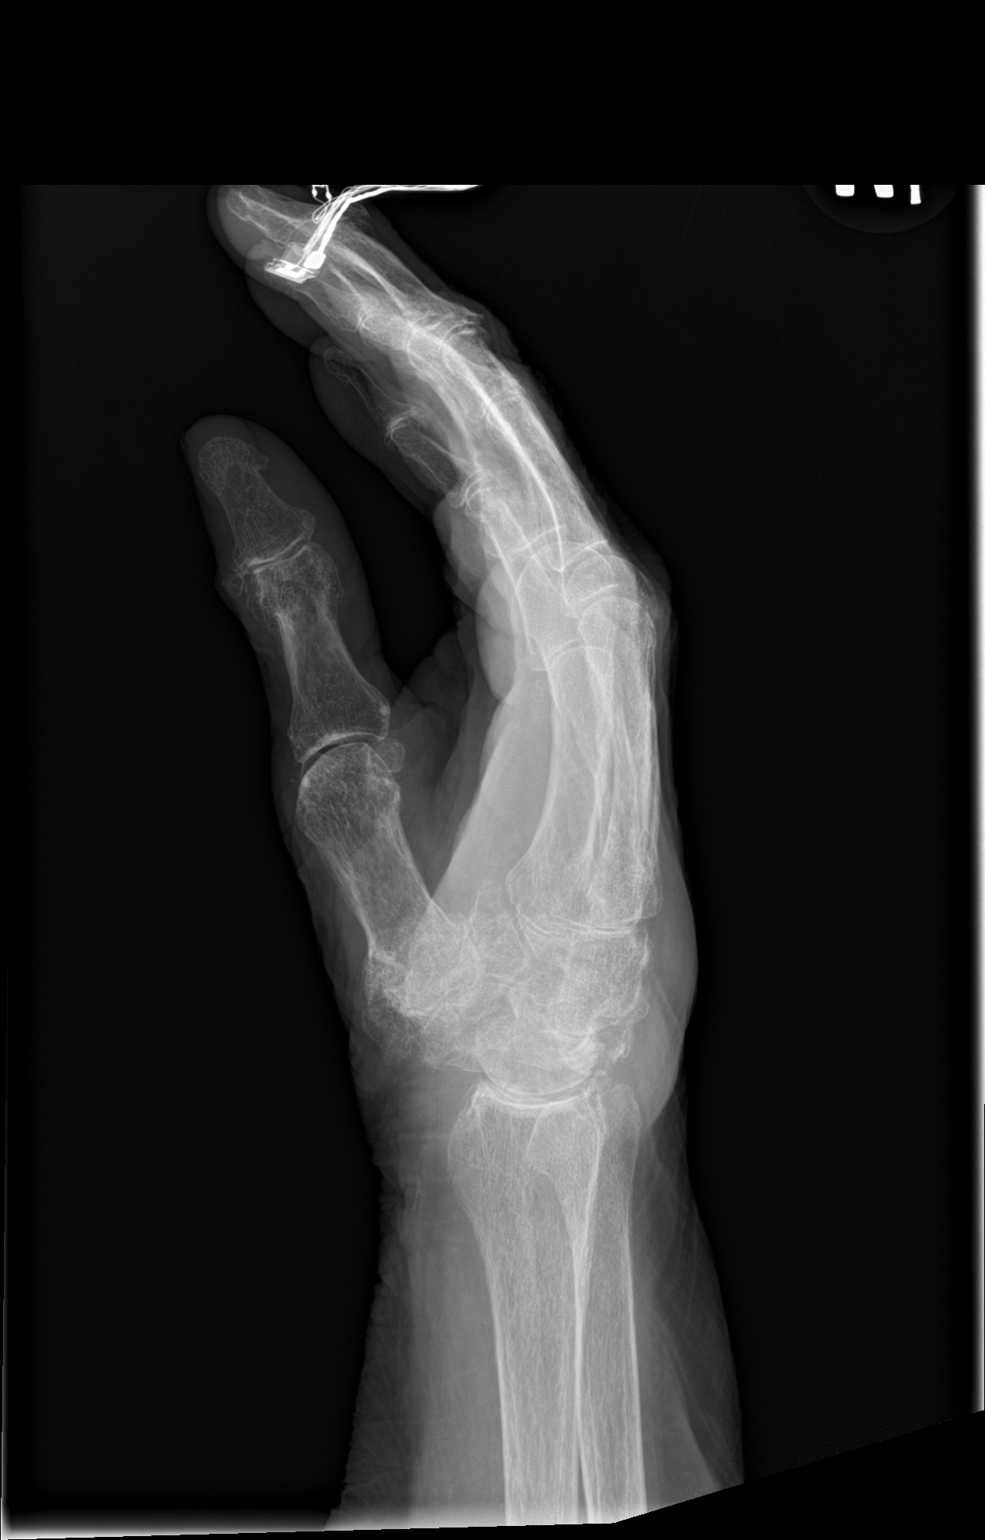

[2 of 2 positions shown; findings below may reference images not displayed]

FINDINGS: Osseous demineralization.

Scattered narrowing of IP joints.

Advanced degenerative changes at first CMC joint and at distal pole
of scaphoid.

Additional midcarpal and radiocarpal degenerative changes with
cystic change at the triquetrum.

No acute fracture, dislocation, or additional bone destruction.

Minimal narrowing of the first and fifth MCP joints.

Dorsal soft tissue swelling overlying the carpus.
IMPRESSION: Osseous demineralization with extensive degenerative changes as
above.

## 2020-02-14 IMAGING — DX DG PELVIS 1-2V
1 series · 1 of 1 positions shown · non-contrast
Comparison: None

CLINICAL DATA: Multiple falls, difficulty ambulating

EXAM:
PELVIS - 1-2 VIEW

[pelvis ap]
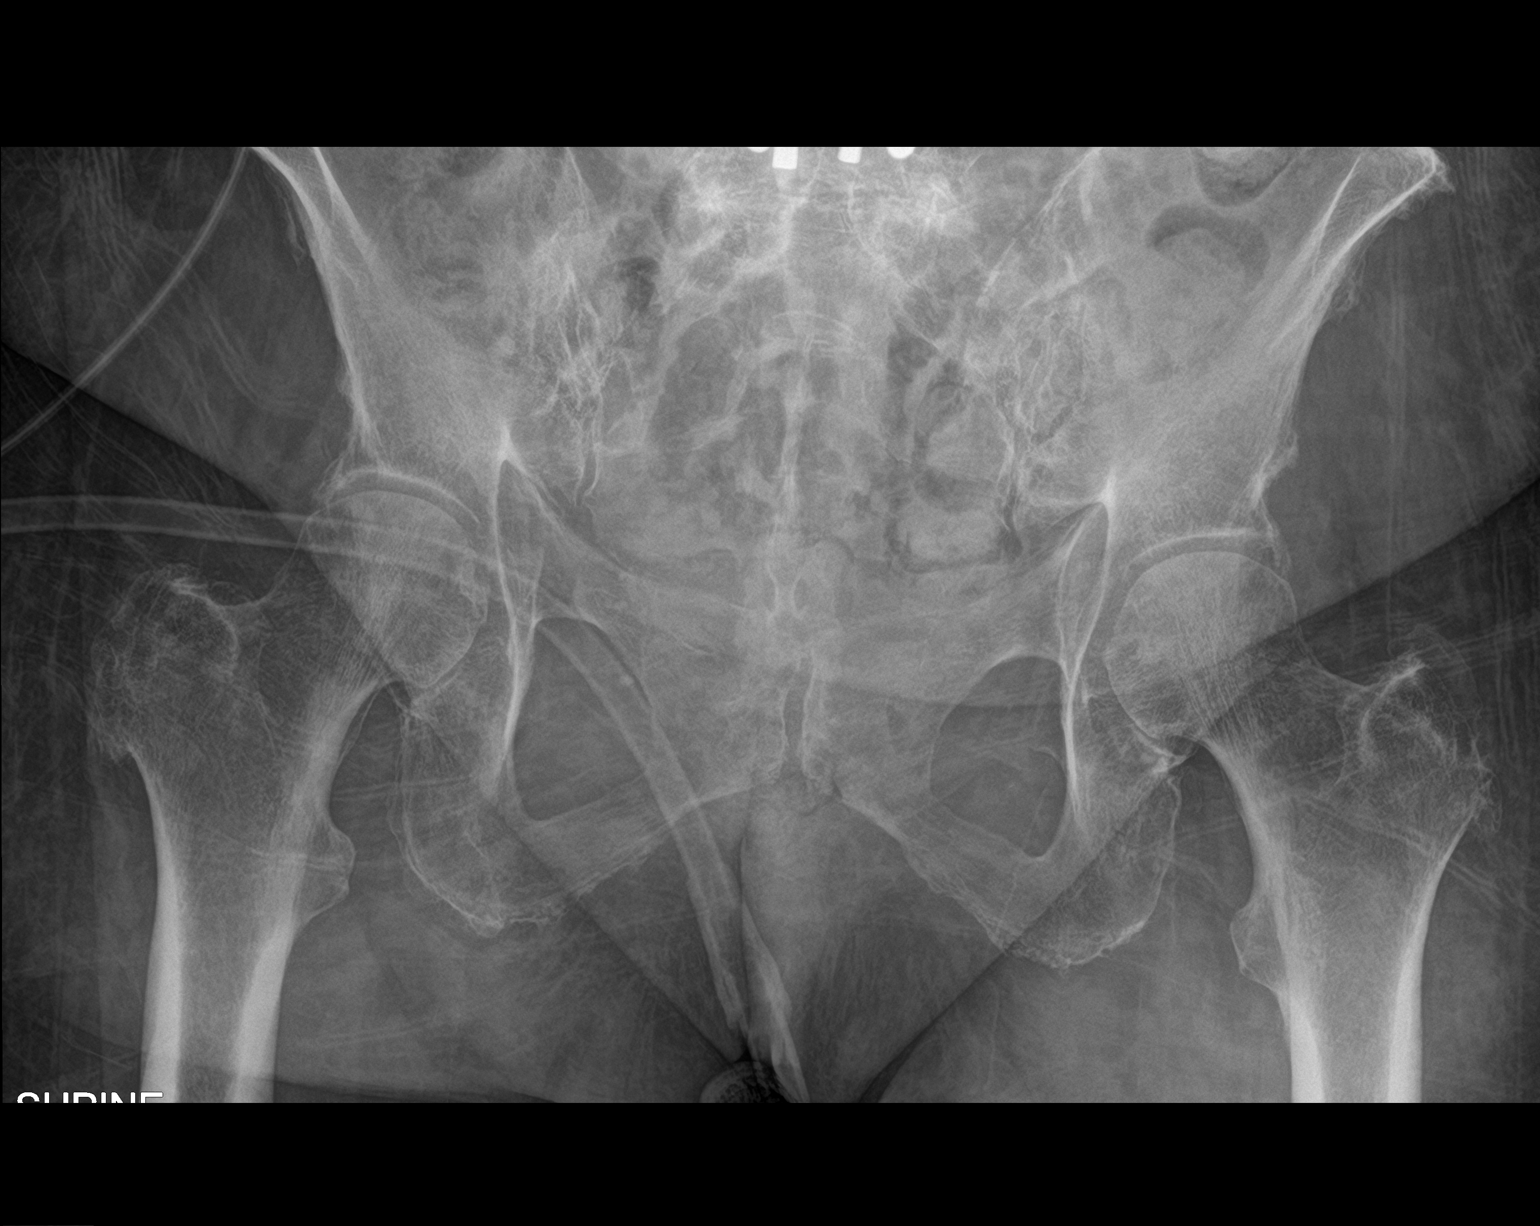

[1 of 1 positions shown; findings below may reference images not displayed]

FINDINGS: Osseous demineralization.

Prior lumbar fusion.

Hip and SI joint spaces preserved.

No acute fracture, dislocation, or bone destruction.
IMPRESSION: No acute abnormalities.

## 2020-02-14 IMAGING — DX DG CHEST 2V
2 series · 2 of 2 positions shown · non-contrast
Comparison: None.

CLINICAL DATA: Weakness.

EXAM:
CHEST - 2 VIEW

[chest ap]
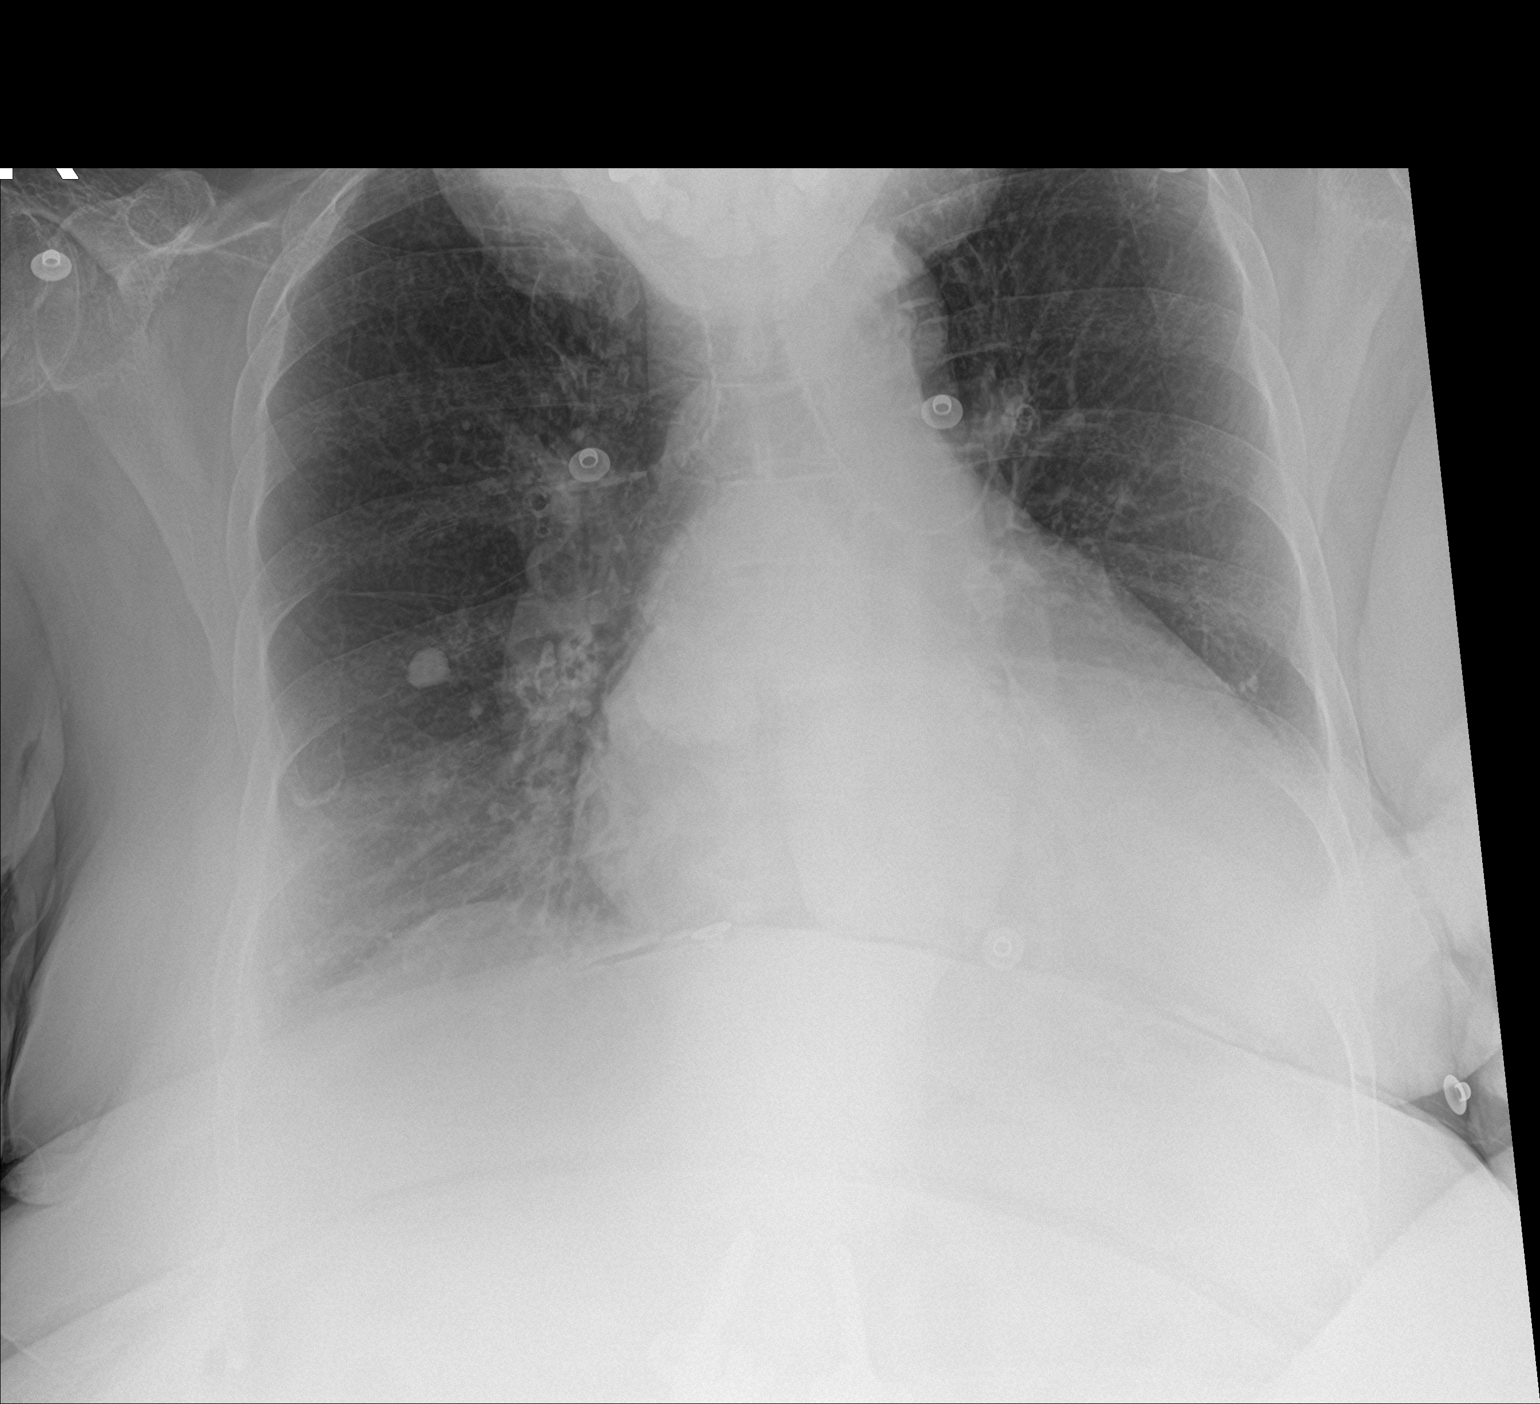

[chest lat]
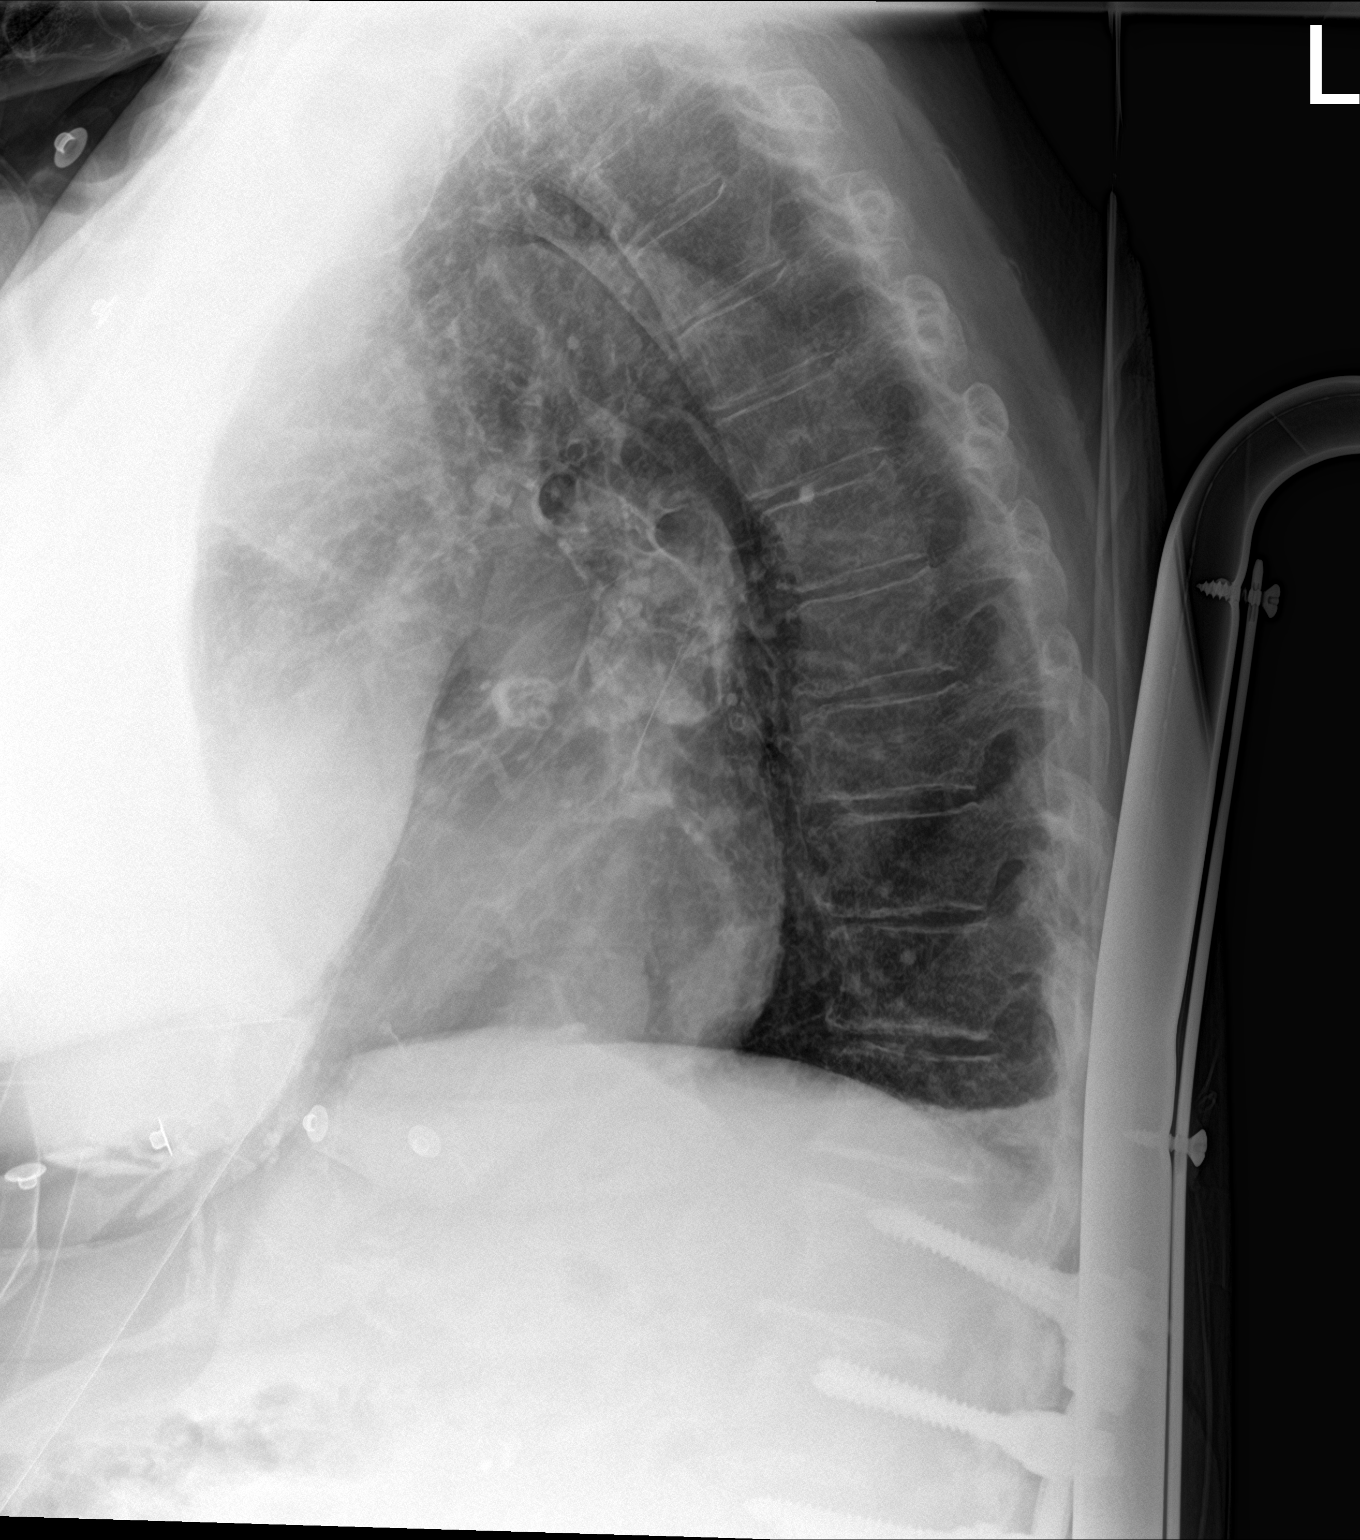

[2 of 2 positions shown; findings below may reference images not displayed]

FINDINGS: Mild cardiomegaly is noted. No pneumothorax or pleural effusion is
noted. Calcified granuloma is noted in right midlung. No
consolidative process is noted.. The visualized skeletal structures
are unremarkable.
IMPRESSION: No active cardiopulmonary disease.

## 2020-02-21 DIAGNOSIS — Z7901 Long term (current) use of anticoagulants: Secondary | ICD-10-CM | POA: Diagnosis not present

## 2020-02-21 DIAGNOSIS — I480 Paroxysmal atrial fibrillation: Secondary | ICD-10-CM | POA: Diagnosis not present

## 2020-03-27 DIAGNOSIS — Z23 Encounter for immunization: Secondary | ICD-10-CM | POA: Diagnosis not present

## 2020-03-27 DIAGNOSIS — I480 Paroxysmal atrial fibrillation: Secondary | ICD-10-CM | POA: Diagnosis not present

## 2020-03-27 DIAGNOSIS — Z7901 Long term (current) use of anticoagulants: Secondary | ICD-10-CM | POA: Diagnosis not present

## 2020-04-30 DIAGNOSIS — Z23 Encounter for immunization: Secondary | ICD-10-CM | POA: Diagnosis not present

## 2020-05-02 DIAGNOSIS — Z7901 Long term (current) use of anticoagulants: Secondary | ICD-10-CM | POA: Diagnosis not present

## 2020-05-02 DIAGNOSIS — I4821 Permanent atrial fibrillation: Secondary | ICD-10-CM | POA: Diagnosis not present

## 2020-06-03 DIAGNOSIS — E118 Type 2 diabetes mellitus with unspecified complications: Secondary | ICD-10-CM | POA: Diagnosis not present

## 2020-06-03 DIAGNOSIS — E7800 Pure hypercholesterolemia, unspecified: Secondary | ICD-10-CM | POA: Diagnosis not present

## 2020-06-03 DIAGNOSIS — M109 Gout, unspecified: Secondary | ICD-10-CM | POA: Diagnosis not present

## 2020-06-03 DIAGNOSIS — Z7901 Long term (current) use of anticoagulants: Secondary | ICD-10-CM | POA: Diagnosis not present

## 2020-06-10 DIAGNOSIS — Z8616 Personal history of COVID-19: Secondary | ICD-10-CM | POA: Diagnosis not present

## 2020-06-10 DIAGNOSIS — I509 Heart failure, unspecified: Secondary | ICD-10-CM | POA: Diagnosis not present

## 2020-06-10 DIAGNOSIS — Z7901 Long term (current) use of anticoagulants: Secondary | ICD-10-CM | POA: Diagnosis not present

## 2020-06-10 DIAGNOSIS — Z9981 Dependence on supplemental oxygen: Secondary | ICD-10-CM | POA: Diagnosis not present

## 2020-06-10 DIAGNOSIS — M5136 Other intervertebral disc degeneration, lumbar region: Secondary | ICD-10-CM | POA: Diagnosis not present

## 2020-06-10 DIAGNOSIS — Z993 Dependence on wheelchair: Secondary | ICD-10-CM | POA: Diagnosis not present

## 2020-06-10 DIAGNOSIS — R82998 Other abnormal findings in urine: Secondary | ICD-10-CM | POA: Diagnosis not present

## 2020-06-10 DIAGNOSIS — I110 Hypertensive heart disease with heart failure: Secondary | ICD-10-CM | POA: Diagnosis not present

## 2020-06-10 DIAGNOSIS — I4821 Permanent atrial fibrillation: Secondary | ICD-10-CM | POA: Diagnosis not present

## 2020-06-10 DIAGNOSIS — M109 Gout, unspecified: Secondary | ICD-10-CM | POA: Diagnosis not present

## 2020-06-10 DIAGNOSIS — Z0000 Encounter for general adult medical examination without abnormal findings: Secondary | ICD-10-CM | POA: Diagnosis not present

## 2020-06-10 DIAGNOSIS — M1990 Unspecified osteoarthritis, unspecified site: Secondary | ICD-10-CM | POA: Diagnosis not present

## 2020-06-10 DIAGNOSIS — E118 Type 2 diabetes mellitus with unspecified complications: Secondary | ICD-10-CM | POA: Diagnosis not present

## 2020-07-08 DIAGNOSIS — L820 Inflamed seborrheic keratosis: Secondary | ICD-10-CM | POA: Diagnosis not present

## 2020-07-09 DIAGNOSIS — Z7901 Long term (current) use of anticoagulants: Secondary | ICD-10-CM | POA: Diagnosis not present

## 2020-07-09 DIAGNOSIS — I4821 Permanent atrial fibrillation: Secondary | ICD-10-CM | POA: Diagnosis not present

## 2020-07-23 ENCOUNTER — Emergency Department (HOSPITAL_COMMUNITY)
Admission: EM | Admit: 2020-07-23 | Discharge: 2020-07-23 | Disposition: A | Discharge status: Home / Self Care | Payer: Medicare Other | Attending: Emergency Medicine | Admitting: Emergency Medicine

## 2020-07-23 ENCOUNTER — Encounter (HOSPITAL_COMMUNITY): Payer: Self-pay

## 2020-07-23 ENCOUNTER — Other Ambulatory Visit: Payer: Self-pay

## 2020-07-23 ENCOUNTER — Emergency Department (HOSPITAL_COMMUNITY): Payer: Medicare Other

## 2020-07-23 DIAGNOSIS — Z87891 Personal history of nicotine dependence: Secondary | ICD-10-CM | POA: Insufficient documentation

## 2020-07-23 DIAGNOSIS — Z7901 Long term (current) use of anticoagulants: Secondary | ICD-10-CM | POA: Diagnosis not present

## 2020-07-23 DIAGNOSIS — J45909 Unspecified asthma, uncomplicated: Secondary | ICD-10-CM | POA: Diagnosis not present

## 2020-07-23 DIAGNOSIS — I10 Essential (primary) hypertension: Secondary | ICD-10-CM | POA: Insufficient documentation

## 2020-07-23 DIAGNOSIS — R0902 Hypoxemia: Secondary | ICD-10-CM | POA: Diagnosis not present

## 2020-07-23 DIAGNOSIS — R410 Disorientation, unspecified: Secondary | ICD-10-CM | POA: Diagnosis not present

## 2020-07-23 DIAGNOSIS — R5383 Other fatigue: Secondary | ICD-10-CM | POA: Insufficient documentation

## 2020-07-23 DIAGNOSIS — I517 Cardiomegaly: Secondary | ICD-10-CM | POA: Diagnosis not present

## 2020-07-23 DIAGNOSIS — R000 Tachycardia, unspecified: Secondary | ICD-10-CM | POA: Diagnosis not present

## 2020-07-23 DIAGNOSIS — Z7984 Long term (current) use of oral hypoglycemic drugs: Secondary | ICD-10-CM | POA: Diagnosis not present

## 2020-07-23 DIAGNOSIS — Z79899 Other long term (current) drug therapy: Secondary | ICD-10-CM | POA: Insufficient documentation

## 2020-07-23 DIAGNOSIS — Z20822 Contact with and (suspected) exposure to covid-19: Secondary | ICD-10-CM | POA: Diagnosis not present

## 2020-07-23 DIAGNOSIS — E860 Dehydration: Secondary | ICD-10-CM | POA: Diagnosis not present

## 2020-07-23 DIAGNOSIS — I4891 Unspecified atrial fibrillation: Secondary | ICD-10-CM | POA: Insufficient documentation

## 2020-07-23 DIAGNOSIS — Z7951 Long term (current) use of inhaled steroids: Secondary | ICD-10-CM | POA: Diagnosis not present

## 2020-07-23 DIAGNOSIS — R531 Weakness: Secondary | ICD-10-CM | POA: Insufficient documentation

## 2020-07-23 DIAGNOSIS — R21 Rash and other nonspecific skin eruption: Secondary | ICD-10-CM | POA: Diagnosis not present

## 2020-07-23 DIAGNOSIS — E119 Type 2 diabetes mellitus without complications: Secondary | ICD-10-CM | POA: Diagnosis not present

## 2020-07-23 LAB — URINALYSIS, ROUTINE W REFLEX MICROSCOPIC
Glucose, UA: NEGATIVE mg/dL
Hgb urine dipstick: NEGATIVE
Ketones, ur: 5 mg/dL — AB
Leukocytes,Ua: NEGATIVE
Nitrite: NEGATIVE
Protein, ur: 30 mg/dL — AB
Specific Gravity, Urine: 1.028 (ref 1.005–1.030)
pH: 5 (ref 5.0–8.0)

## 2020-07-23 LAB — CBC WITH DIFFERENTIAL/PLATELET
Abs Immature Granulocytes: 0.08 10*3/uL — ABNORMAL HIGH (ref 0.00–0.07)
Basophils Absolute: 0 10*3/uL (ref 0.0–0.1)
Basophils Relative: 0 %
Eosinophils Absolute: 0 10*3/uL (ref 0.0–0.5)
Eosinophils Relative: 0 %
HCT: 36.4 % (ref 36.0–46.0)
Hemoglobin: 11.3 g/dL — ABNORMAL LOW (ref 12.0–15.0)
Immature Granulocytes: 0 %
Lymphocytes Relative: 7 %
Lymphs Abs: 1.2 10*3/uL (ref 0.7–4.0)
MCH: 27.5 pg (ref 26.0–34.0)
MCHC: 31 g/dL (ref 30.0–36.0)
MCV: 88.6 fL (ref 80.0–100.0)
Monocytes Absolute: 1 10*3/uL (ref 0.1–1.0)
Monocytes Relative: 6 %
Neutro Abs: 15.6 10*3/uL — ABNORMAL HIGH (ref 1.7–7.7)
Neutrophils Relative %: 87 %
Platelets: 229 10*3/uL (ref 150–400)
RBC: 4.11 MIL/uL (ref 3.87–5.11)
RDW: 15.9 % — ABNORMAL HIGH (ref 11.5–15.5)
WBC: 18 10*3/uL — ABNORMAL HIGH (ref 4.0–10.5)
nRBC: 0 % (ref 0.0–0.2)

## 2020-07-23 LAB — PROTIME-INR
INR: 2.6 — ABNORMAL HIGH (ref 0.8–1.2)
Prothrombin Time: 27 seconds — ABNORMAL HIGH (ref 11.4–15.2)

## 2020-07-23 LAB — COMPREHENSIVE METABOLIC PANEL
ALT: 13 U/L (ref 0–44)
AST: 17 U/L (ref 15–41)
Albumin: 3.6 g/dL (ref 3.5–5.0)
Alkaline Phosphatase: 56 U/L (ref 38–126)
Anion gap: 14 (ref 5–15)
BUN: 31 mg/dL — ABNORMAL HIGH (ref 8–23)
CO2: 28 mmol/L (ref 22–32)
Calcium: 8.7 mg/dL — ABNORMAL LOW (ref 8.9–10.3)
Chloride: 96 mmol/L — ABNORMAL LOW (ref 98–111)
Creatinine, Ser: 1.57 mg/dL — ABNORMAL HIGH (ref 0.44–1.00)
GFR, Estimated: 32 mL/min — ABNORMAL LOW (ref >60)
Glucose, Bld: 167 mg/dL — ABNORMAL HIGH (ref 70–99)
Potassium: 4 mmol/L (ref 3.5–5.1)
Sodium: 138 mmol/L (ref 135–145)
Total Bilirubin: 0.8 mg/dL (ref 0.3–1.2)
Total Protein: 6.7 g/dL (ref 6.5–8.1)

## 2020-07-23 LAB — LIPASE, BLOOD: Lipase: 17 U/L (ref 11–51)

## 2020-07-23 LAB — SARS CORONAVIRUS 2 BY RT PCR (HOSPITAL ORDER, PERFORMED IN ~~LOC~~ HOSPITAL LAB): SARS Coronavirus 2: NEGATIVE

## 2020-07-23 MED ORDER — LACTATED RINGERS IV BOLUS
500.0000 mL | Freq: Once | INTRAVENOUS | Status: AC
  Administered 2020-07-23: 500 mL via INTRAVENOUS

## 2020-07-23 NOTE — Discharge Instructions (Signed)
Follow-up with your doctor to be rechecked.  Your white blood cell count is elevated but we did not find any other sign of infection.  Return to the emergency room for canceling symptoms.  We will call you if the urine culture is positive for infection

## 2020-07-23 NOTE — ED Triage Notes (Signed)
Pt BIB EMS from home. Pt reports fatigue and weakness x3 days. Pt has been incontinent and has dark urine. A&O x4.   106/80 to 130/80 after 1L NS CBG 210  HR 110 RR 18 96% RA 20G LH

## 2020-07-23 NOTE — ED Provider Notes (Signed)
[facility] DEPT Provider Note   CSN: [id number] Arrival date & time: 07/23/20  1708     History Chief Complaint  Patient presents with  . Weakness    Casey Patel is a 87 y.o. female.  HPI   Patient presents to the ED with complaints of fatigue and weakness ongoing for the last 3 days.  Patient lives at home with her sister.  She usually gets around with a walker.  Patient states over the last couple of days she started to feel more fatigued and weak.  She has not had much of an appetite.  She is not having trouble with abdominal pain or vomiting.  No difficulty swallowing.  She states she is just not hungry.  Patient also has noted some urinary frequency and a change in color to her urine.  She is concerned she may be developing urinary tract infection.  Today she was having increasing weakness and could not get around with her walker.  She denies any cough.  No chest pain.  No abdominal pain.  No headache.  No fevers or chills.  Past Medical History:  Diagnosis Date  . AF (atrial fibrillation) ([place])   . Arthritis   . Asthma   . Diabetes mellitus    TYPE 2  . Hyperlipidemia   . Hypertension   . PNA (pneumonia)     Patient Active Problem List   Diagnosis Date Noted  . Pressure injury of skin 03/08/2018  . Atrial fibrillation ([place]) 03/07/2018  . Cough 01/01/2012  . Pneumonia 12/03/2011  . Arthritis   . Hypertension   . Hyperlipidemia   . Asthma   . DM (diabetes mellitus) ([place])     Past Surgical History:  Procedure Laterality Date  . ABDOMINAL HYSTERECTOMY  1976  . BACK SURGERY     lumb x 3  . CARDIOVERSION  12/07/2011   Procedure: CARDIOVERSION;  Surgeon: Peter M Martinique, MD;  Location: [facility];  Service: Cardiovascular;  Laterality: N/A;  . CARPAL TUNNEL RELEASE  09/15/2011   Procedure: CARPAL TUNNEL RELEASE;  Surgeon: Wynonia Sours, MD;  Location: [facility];  Service: Orthopedics;  Laterality: Right;  . CATARACT  EXTRACTION    . CHOLECYSTECTOMY  2003  . HAND TENDON SURGERY  1999   left wrist - thumb  . LAMINECTOMY  1999   with fusion (L1-5)  . LAMINECTOMY  [id number]   with fusion (L3-4)  . TONSILLECTOMY  1952     OB History   No obstetric history on file.     Family History  Problem Relation Age of Onset  . Hypertension Mother 55  . Stroke Mother 29  . Arthritis Mother 73  . Heart attack Father 7  . Heart disease Father 68  . Hypertension Father 89  . Ulcers Father 4  . Breast cancer Sister   . Heart disease Brother        CONGENTIAL HEART DISEASE  . Heart disease Sister   . Diabetes Sister   . Breast cancer Sister     Social History   Tobacco Use  . Smoking status: Former Smoker    Packs/day: 0.80    Years: 20.00    Pack years: 16.00    Types: Cigarettes    Quit date: 06/29/1978    Years since quitting: 42.0  . Smokeless tobacco: Never Used  Substance Use Topics  . Alcohol use: No  . Drug use: No    Home Medications Prior to  Admission medications   Medication Sig Start Date End Date Taking? Authorizing Provider  Cholecalciferol (VITAMIN D) 2000 UNITS CAPS Take 2,000 Units by mouth daily.    [provider]  docusate sodium (COLACE) 100 MG capsule Take 1 capsule (100 mg total) by mouth daily as needed for mild constipation. 03/11/18   Patrecia Pour, MD  fexofenadine (ALLEGRA) 180 MG tablet Take 180 mg by mouth daily at 6 PM.     [provider]  fluticasone furoate-vilanterol (BREO ELLIPTA) 100-25 MCG/INH AEPB Inhale 1 puff into the lungs at bedtime.    [provider]  furosemide (LASIX) 20 MG tablet Take 20 mg by mouth.    [provider]  metFORMIN (GLUCOPHAGE) 500 MG tablet Take 500 mg by mouth 2 (two) times daily with a meal.  02/06/14   [provider]  methocarbamol (ROBAXIN) 750 MG tablet Take 750 mg by mouth 3 (three) times daily.     [provider]  metoprolol (TOPROL-XL) 100 MG 24 hr tablet Take 150 mg by  mouth at bedtime.     [provider]  Nutritional Supplements (GLUCERNA ADVANCE SHAKE) LIQD Take 1 shake twice a week 08/08/19   Martinique, Peter M, MD  omeprazole (PRILOSEC) 20 MG capsule Take 20 mg by mouth daily.    [provider]  traMADol (ULTRAM) 50 MG tablet Take 1 tablet (50 mg total) by mouth every 6 (six) hours as needed (pain). 03/11/18   Patrecia Pour, MD  warfarin (COUMADIN) 5 MG tablet Take 2.5-5 mg by mouth See admin instructions. Take 1 tablet (5 mg) by mouth on Sunday and Thursday at 5pm, take 1/2 tablet (2.5 mg) on Monday, Tuesday, Wednesday, Friday, Saturday at [employer], Historical, MD    Allergies    Wheat bran, Adhesive [tape], Kiwi extract, Orange fruit [citrus], Pineapple, Promethazine hcl, Tomato, Tylenol [acetaminophen], Vistaril [hydroxyzine hcl], and Codeine  Review of Systems   Review of Systems  All other systems reviewed and are negative.   Physical Exam Updated Vital Signs BP (!) 147/93   Pulse 95   Temp 98.5 F (36.9 C) (Oral)   Resp 18   SpO2 100%   Physical Exam Vitals and nursing note reviewed.  Constitutional:      Appearance: She is well-developed and well-nourished.     Comments: Elderly, frail  HENT:     Head: Normocephalic and atraumatic.     Right Ear: External ear normal.     Left Ear: External ear normal.  Eyes:     General: No scleral icterus.       Right eye: No discharge.        Left eye: No discharge.     Conjunctiva/sclera: Conjunctivae normal.  Neck:     Trachea: No tracheal deviation.  Cardiovascular:     Rate and Rhythm: Normal rate and regular rhythm.     Pulses: Intact distal pulses.  Pulmonary:     Effort: Pulmonary effort is normal. No respiratory distress.     Breath sounds: Normal breath sounds. No stridor. No wheezing or rales.  Abdominal:     General: Bowel sounds are normal. There is no distension.     Palpations: Abdomen is soft.     Tenderness: There is no abdominal tenderness. There is  no guarding or rebound.  Musculoskeletal:        General: No tenderness or edema.     Cervical back: Neck supple.     Comments:  Kyphosis of the spine  Skin:    General: Skin is warm and dry.     Findings: No rash.  Neurological:     Mental Status: She is alert.     Cranial Nerves: No cranial nerve deficit (no facial droop, extraocular movements intact, no slurred speech).     Sensory: No sensory deficit.     Motor: Weakness present. No abnormal muscle tone or seizure activity.     Coordination: Coordination normal.     Deep Tendon Reflexes: Strength normal.     Comments: Generalized weakness but no focal deficits  Psychiatric:        Mood and Affect: Mood and affect normal.     ED Results / Procedures / Treatments   Labs (all labs ordered are listed, but only abnormal results are displayed) Labs Reviewed  CBC WITH DIFFERENTIAL/PLATELET - Abnormal; Notable for the following components:      Result Value   WBC 18.0 (*)    Hemoglobin 11.3 (*)    RDW 15.9 (*)    Neutro Abs 15.6 (*)    Abs Immature Granulocytes 0.08 (*)    All other components within normal limits  PROTIME-INR - Abnormal; Notable for the following components:   Prothrombin Time 27.0 (*)    INR 2.6 (*)    All other components within normal limits  URINALYSIS, ROUTINE W REFLEX MICROSCOPIC - Abnormal; Notable for the following components:   Color, Urine AMBER (*)    APPearance CLOUDY (*)    Bilirubin Urine SMALL (*)    Ketones, ur 5 (*)    Protein, ur 30 (*)    Bacteria, UA RARE (*)    All other components within normal limits  COMPREHENSIVE METABOLIC PANEL - Abnormal; Notable for the following components:   Chloride 96 (*)    Glucose, Bld 167 (*)    BUN 31 (*)    Creatinine, Ser 1.57 (*)    Calcium 8.7 (*)    GFR, Estimated 32 (*)    All other components within normal limits  SARS CORONAVIRUS 2 BY RT PCR (HOSPITAL ORDER, [facility])  URINE CULTURE  LIPASE, BLOOD     EKG EKG Interpretation  Date/Time:  Tuesday July 23 2020 17:29:57 EST Ventricular Rate:  105 PR Interval:    QRS Duration: 106 QT Interval:  404 QTC Calculation: 534 R Axis:   -11 Text Interpretation: Atrial fibrillation Paired ventricular premature complexes Probable anterior infarct, age indeterminate Prolonged QT interval No significant change since last tracing Confirmed by Dorie Rank [phone]) on 07/23/2020 6:11:01 PM   Radiology DG Chest Portable 1 View  Result Date: 07/23/2020 CLINICAL DATA:  87 year old female with weakness. EXAM: PORTABLE CHEST 1 VIEW COMPARISON:  Chest radiograph dated 03/07/2018. FINDINGS: Evaluation is limited due to superimposition of the patient's jaw over the upper chest. Stable cardiomegaly with probable mild central vascular congestion. No focal consolidation, pleural effusion or pneumothorax. Right perihilar calcified granuloma. No acute osseous pathology. IMPRESSION: Cardiomegaly with probable mild central vascular congestion. No focal consolidation. Electronically Signed   By: Anner Crete M.D.   On: 07/23/2020 18:56    Procedures Procedures   Medications Ordered in ED Medications  lactated ringers bolus 500 mL (0 mLs Intravenous Stopped 07/23/20 2010)    ED Course  I have reviewed the triage vital signs and the nursing notes.  Pertinent labs & imaging results that were available during my care of the patient were reviewed by me and considered in  my medical decision making (see chart for details).  Clinical Course as of 07/23/20 2058  Tue Jul 23, 2020  2021 Patient's urinalysis does not show definite UTI. [JK]  4888 Metabolic panel does show an increase in creatinine consistent with acute kidney injury dehydration [JK]  2021 Leukocytosis is new  [JK]  2022 Chest x-ray without pneumonia. [JK]  2032 Patient's Covid test is negative [JK]    Clinical Course User Index [JK] Dorie Rank, MD   MDM Rules/Calculators/A&P                           Patient presented to ED with complaints of generalized weakness and fatigue.  She was concerned about the possibility of urinary tract infection.  In the ED the patient has no focal neurologic deficit.  ED work-up does not show any signs of pneumonia.  Urinalysis does not suggest just urinary tract infection.  Patient does have a leukocytosis but no other signs of infection.  Her INR is in the therapeutic range.  Hemoglobin is stable so no signs of GI bleeding.  EKG shows her known A. fib.  Patient's electrolytes do show an elevated BUN and creatinine but we do not have an old one for comparison.  Unclear if allocates of dehydration versus her baseline.  Patient was given IV fluids and she is feeling better.  She was able to walk around the ED.  Patient did have a slight decrease in her oxygen level when ambulating but she actually has oxygen at home when available.  Plan on discharge with close outpatient Final Clinical Impression(s) / ED Diagnoses Final diagnoses:  Weakness    Rx / DC Orders ED Discharge Orders    None       Dorie Rank, MD 07/23/20 2059

## 2020-07-25 LAB — URINE CULTURE: Culture: <10000 — AB

## 2020-08-03 NOTE — Progress Notes (Signed)
Casey Patel Date of Birth: 1929/08/23   History of Present Illness: Casey Patel is seen  for followup of chronic atrial fibrillation. She has failed multiple attempts at cardioversion before. She is being managed with rate control and anticoagulation. On Toprol and Coumadin.   She was seen in the hospital in September 2019 following a mechanical fall. She had a fall and a UTI. Afib rate was increased. She was managed with metoprolol and addition of digoxin. Echo showed normal LV function with mild MR. Moderate pulmonary HTN and RV dysfunction. Digoxin was later discontinued.  She was seen in the ED in January with weakness and fatigue. WBC, BUN, and creatinine elevated. No clear source of infection. It was felt she was dehydrated and she was given IV fluids and DC. She states it is very hard for her to hydrate. Has a difficult time swallowing and it takes her a long time to drink a glass of water. She has noted more indigestion over the past few days. No palpitations or dizziness. No edema. No dyspnea.  She has severe cervical kyphosis and is seen in a wheelchair.   Current Outpatient Medications on File Prior to Visit  Medication Sig Dispense Refill  . Cholecalciferol (VITAMIN D) 2000 UNITS CAPS Take 2,000 Units by mouth daily.    [name] docusate sodium (COLACE) 100 MG capsule Take 1 capsule (100 mg total) by mouth daily as needed for mild constipation. 10 capsule 0  . fluticasone furoate-vilanterol (BREO ELLIPTA) 100-25 MCG/INH AEPB Inhale 1 puff into the lungs at bedtime.    . metFORMIN (GLUCOPHAGE) 500 MG tablet Take 500 mg by mouth 2 (two) times daily with a meal.     . methocarbamol (ROBAXIN) 750 MG tablet Take 750 mg by mouth 3 (three) times daily.     . metoprolol (TOPROL-XL) 100 MG 24 hr tablet Take 150 mg by mouth at bedtime.    . Nutritional Supplements (GLUCERNA ADVANCE SHAKE) LIQD Take 1 shake twice a week    . omeprazole (PRILOSEC) 20 MG capsule Take 20 mg by mouth daily.    .  traMADol (ULTRAM) 50 MG tablet Take 1 tablet (50 mg total) by mouth every 6 (six) hours as needed (pain). 12 tablet 0  . warfarin (COUMADIN) 5 MG tablet Take 2.5-5 mg by mouth See admin instructions. Take 1 tablet (5 mg) by mouth on Sunday and Thursday at 5pm, take 1/2 tablet (2.5 mg) on Monday, Tuesday, Wednesday, Friday, Saturday at 5pm     No current facility-administered medications on file prior to visit.    Allergies  Allergen Reactions  . Wheat Bran Shortness Of Breath  . Adhesive [Tape] Other (See Comments)    Blisters on skin  . Kiwi Extract Other (See Comments)    Causes mouth sores  . Orange Fruit [Citrus] Other (See Comments)    Causes mouth sores  . Pineapple Other (See Comments)    Causes mouth sores  . Promethazine Hcl Other (See Comments)    Disorientation   . Tomato Other (See Comments)    Causes mouth sores  . Tylenol [Acetaminophen] Other (See Comments)    Causes fast heart rate  . Vistaril [Hydroxyzine Hcl] Other (See Comments)    confusion  . Codeine Itching and Rash    Past Medical History:  Diagnosis Date  . AF (atrial fibrillation) ([place])   . Arthritis   . Asthma   . Diabetes mellitus    TYPE 2  . Hyperlipidemia   . Hypertension   .  PNA (pneumonia)     Past Surgical History:  Procedure Laterality Date  . ABDOMINAL HYSTERECTOMY  1976  . BACK SURGERY     lumb x 3  . CARDIOVERSION  12/07/2011   Procedure: CARDIOVERSION;  Surgeon: Daniesha Driver M Martinique, MD;  Location: [place];  Service: Cardiovascular;  Laterality: N/A;  . CARPAL TUNNEL RELEASE  09/15/2011   Procedure: CARPAL TUNNEL RELEASE;  Surgeon: Wynonia Sours, MD;  Location: [place];  Service: Orthopedics;  Laterality: Right;  . CATARACT EXTRACTION    . CHOLECYSTECTOMY  2003  . HAND TENDON SURGERY  1999   left wrist - thumb  . LAMINECTOMY  1999   with fusion (L1-5)  . LAMINECTOMY  [id number]   with fusion (L3-4)  . TONSILLECTOMY  1952    Social History   Tobacco Use  Smoking  Status Former Smoker  . Packs/day: 0.80  . Years: 20.00  . Pack years: 16.00  . Types: Cigarettes  . Quit date: 06/29/1978  . Years since quitting: 42.1  Smokeless Tobacco Never Used    Social History   Substance and Sexual Activity  Alcohol Use No    Family History  Problem Relation Age of Onset  . Hypertension Mother 101  . Stroke Mother 26  . Arthritis Mother 79  . Heart attack Father 75  . Heart disease Father 8  . Hypertension Father 58  . Ulcers Father 64  . Breast cancer Sister   . Heart disease Brother        CONGENTIAL HEART DISEASE  . Heart disease Sister   . Diabetes Sister   . Breast cancer Sister     Review of Systems:  As noted in history of present illness. All other systems were reviewed and are negative.  Physical Exam: BP 104/70   Pulse 82   Ht 5\' 4"  (1.626 m)   Wt 178 lb 6.4 oz (80.9 kg)   BMI 30.62 kg/m  GENERAL:  Well appearing, obese WF in wheelchair. HEENT:  PERRL, EOMI, sclera are clear. Oropharynx is clear. NECK:  No jugular venous distention, carotid upstroke brisk and symmetric, no bruits, no thyromegaly or adenopathy. Marked cervical kyphosis.  LUNGS:  Clear to auscultation bilaterally CHEST:  Unremarkable HEART:  IRRR,  PMI not displaced or sustained,S1 and S2 within normal limits, no S3, no S4: no clicks, no rubs, no murmurs Spine: kyphoscoliosis.  ABD:  Soft, nontender. BS +, no masses or bruits. No hepatomegaly, no splenomegaly EXT:  2 + pulses throughout, no edema, no cyanosis no clubbing SKIN:  Warm and dry.  No rashes NEURO:  Alert and oriented x 3. Cranial nerves II through XII intact. PSYCH:  Cognitively intact   LABORATORY DATA: Lab Results  Component Value Date   WBC 18.0 (H) 07/23/2020   HGB 11.3 (L) 07/23/2020   HCT 36.4 07/23/2020   PLT 229 07/23/2020   GLUCOSE 167 (H) 07/23/2020   ALT 13 07/23/2020   AST 17 07/23/2020   NA 138 07/23/2020   K 4.0 07/23/2020   CL 96 (L) 07/23/2020   CREATININE 1.57 (H)  07/23/2020   BUN 31 (H) 07/23/2020   CO2 28 07/23/2020   TSH 0.847 03/07/2018   INR 2.6 (H) 07/23/2020   HGBA1C 6.0 (H) 03/07/2018   Labs dated 05/07/16: cholesterol 183, triglycerides, 161, HDL 56, LDL 95. A1c 5.6%. CMET normal. Dated 05/24/17: cholesterol 171, triglycerides 101, HDL 58, LDL 93. A1c 6%. Hbg 10.2. Chemistries normal. Dated 05/13/18: cholesterol 174, triglycerides  79, HDL 60, LDL 98. A1c 5.6%. CBC, CMET and TSH normal.  Dated 06/06/19: cholesterol 155, triglycerides 103, HDL 58, LDL 76. A1c 6.2%. CBC and CMET normal  Ecg done 07/23/20 shows Afib with rate 105. Low voltage. Nonspecific ST-T abnormality. I have personally reviewed and interpreted this study.   Echocardiogram 03/08/18: Study Conclusions  - Left ventricle: The cavity size was normal. Wall thickness was normal. Systolic function was normal. The estimated ejection fraction was in the range of 55% to 60%. Wall motion was normal; there were no regional wall motion abnormalities. - Mitral valve: There was mild regurgitation. - Left atrium: The atrium was moderately dilated. - Right ventricle: Systolic function was mildly to moderately reduced. - Right atrium: The atrium was moderately dilated. - Tricuspid valve: There was mild-moderate regurgitation directed centrally. - Pulmonary arteries: PA peak pressure: 55 mm Hg (S).  Assessment / Plan: 1. Atrial fibrillation, permanent. Rate is well controlled on Toprol  and she is anticoagulated appropriately with Coumadin. Will continue current therapy. Last INR 2.5 2. HTN controlled. 3. Pulmonary HTN on Echo. Asymptomatic.  4. Recent ED evaluation with dehydration. Poor po fluid intake with dysphagia. Recommend stopping lasix at this point.   Follow up in one year

## 2020-08-07 ENCOUNTER — Ambulatory Visit (INDEPENDENT_AMBULATORY_CARE_PROVIDER_SITE_OTHER): Payer: Medicare Other | Admitting: Cardiology

## 2020-08-07 ENCOUNTER — Encounter: Payer: Self-pay | Admitting: Cardiology

## 2020-08-07 ENCOUNTER — Other Ambulatory Visit: Payer: Self-pay

## 2020-08-07 VITALS — BP 104/70 | HR 82 | Ht 64.0 in | Wt 178.4 lb

## 2020-08-07 DIAGNOSIS — I482 Chronic atrial fibrillation, unspecified: Secondary | ICD-10-CM | POA: Diagnosis not present

## 2020-08-07 DIAGNOSIS — I10 Essential (primary) hypertension: Secondary | ICD-10-CM

## 2020-08-07 NOTE — Patient Instructions (Signed)
Stop taking lasix daily. You can use as needed for increased swelling or significant weight gain > 5 lbs.

## 2020-08-13 DIAGNOSIS — I4821 Permanent atrial fibrillation: Secondary | ICD-10-CM | POA: Diagnosis not present

## 2020-08-13 DIAGNOSIS — Z7901 Long term (current) use of anticoagulants: Secondary | ICD-10-CM | POA: Diagnosis not present

## 2020-08-27 DIAGNOSIS — I4821 Permanent atrial fibrillation: Secondary | ICD-10-CM | POA: Diagnosis not present

## 2020-08-27 DIAGNOSIS — Z7901 Long term (current) use of anticoagulants: Secondary | ICD-10-CM | POA: Diagnosis not present

## 2020-09-10 DIAGNOSIS — Z7901 Long term (current) use of anticoagulants: Secondary | ICD-10-CM | POA: Diagnosis not present

## 2020-09-10 DIAGNOSIS — I4821 Permanent atrial fibrillation: Secondary | ICD-10-CM | POA: Diagnosis not present

## 2020-10-08 DIAGNOSIS — Z23 Encounter for immunization: Secondary | ICD-10-CM | POA: Diagnosis not present

## 2020-10-15 DIAGNOSIS — I4821 Permanent atrial fibrillation: Secondary | ICD-10-CM | POA: Diagnosis not present

## 2020-10-15 DIAGNOSIS — B379 Candidiasis, unspecified: Secondary | ICD-10-CM | POA: Diagnosis not present

## 2020-10-15 DIAGNOSIS — Z7901 Long term (current) use of anticoagulants: Secondary | ICD-10-CM | POA: Diagnosis not present

## 2020-10-28 DIAGNOSIS — I4821 Permanent atrial fibrillation: Secondary | ICD-10-CM | POA: Diagnosis not present

## 2020-10-28 DIAGNOSIS — M47812 Spondylosis without myelopathy or radiculopathy, cervical region: Secondary | ICD-10-CM | POA: Diagnosis not present

## 2020-10-28 DIAGNOSIS — L89813 Pressure ulcer of head, stage 3: Secondary | ICD-10-CM | POA: Diagnosis not present

## 2020-10-28 DIAGNOSIS — Z7901 Long term (current) use of anticoagulants: Secondary | ICD-10-CM | POA: Diagnosis not present

## 2020-10-28 DIAGNOSIS — B372 Candidiasis of skin and nail: Secondary | ICD-10-CM | POA: Diagnosis not present

## 2020-10-28 DIAGNOSIS — M436 Torticollis: Secondary | ICD-10-CM | POA: Diagnosis not present

## 2020-11-07 DIAGNOSIS — J4540 Moderate persistent asthma, uncomplicated: Secondary | ICD-10-CM | POA: Diagnosis not present

## 2020-11-07 DIAGNOSIS — L89893 Pressure ulcer of other site, stage 3: Secondary | ICD-10-CM | POA: Diagnosis not present

## 2020-11-07 DIAGNOSIS — I110 Hypertensive heart disease with heart failure: Secondary | ICD-10-CM | POA: Diagnosis not present

## 2020-11-07 DIAGNOSIS — I509 Heart failure, unspecified: Secondary | ICD-10-CM | POA: Diagnosis not present

## 2020-11-07 DIAGNOSIS — Z87891 Personal history of nicotine dependence: Secondary | ICD-10-CM | POA: Diagnosis not present

## 2020-11-07 DIAGNOSIS — Z7901 Long term (current) use of anticoagulants: Secondary | ICD-10-CM | POA: Diagnosis not present

## 2020-11-07 DIAGNOSIS — E119 Type 2 diabetes mellitus without complications: Secondary | ICD-10-CM | POA: Diagnosis not present

## 2020-11-07 DIAGNOSIS — I4821 Permanent atrial fibrillation: Secondary | ICD-10-CM | POA: Diagnosis not present

## 2020-11-07 DIAGNOSIS — Z8616 Personal history of COVID-19: Secondary | ICD-10-CM | POA: Diagnosis not present

## 2020-11-07 DIAGNOSIS — Z9181 History of falling: Secondary | ICD-10-CM | POA: Diagnosis not present

## 2020-11-07 DIAGNOSIS — Z7984 Long term (current) use of oral hypoglycemic drugs: Secondary | ICD-10-CM | POA: Diagnosis not present

## 2020-11-07 DIAGNOSIS — Z7951 Long term (current) use of inhaled steroids: Secondary | ICD-10-CM | POA: Diagnosis not present

## 2020-11-11 DIAGNOSIS — E119 Type 2 diabetes mellitus without complications: Secondary | ICD-10-CM | POA: Diagnosis not present

## 2020-11-11 DIAGNOSIS — J4540 Moderate persistent asthma, uncomplicated: Secondary | ICD-10-CM | POA: Diagnosis not present

## 2020-11-11 DIAGNOSIS — I110 Hypertensive heart disease with heart failure: Secondary | ICD-10-CM | POA: Diagnosis not present

## 2020-11-11 DIAGNOSIS — I509 Heart failure, unspecified: Secondary | ICD-10-CM | POA: Diagnosis not present

## 2020-11-11 DIAGNOSIS — I4821 Permanent atrial fibrillation: Secondary | ICD-10-CM | POA: Diagnosis not present

## 2020-11-11 DIAGNOSIS — L89893 Pressure ulcer of other site, stage 3: Secondary | ICD-10-CM | POA: Diagnosis not present

## 2020-11-13 DIAGNOSIS — M25462 Effusion, left knee: Secondary | ICD-10-CM | POA: Diagnosis not present

## 2020-11-13 DIAGNOSIS — M1712 Unilateral primary osteoarthritis, left knee: Secondary | ICD-10-CM | POA: Diagnosis not present

## 2020-11-14 DIAGNOSIS — E119 Type 2 diabetes mellitus without complications: Secondary | ICD-10-CM | POA: Diagnosis not present

## 2020-11-14 DIAGNOSIS — I509 Heart failure, unspecified: Secondary | ICD-10-CM | POA: Diagnosis not present

## 2020-11-14 DIAGNOSIS — J4540 Moderate persistent asthma, uncomplicated: Secondary | ICD-10-CM | POA: Diagnosis not present

## 2020-11-14 DIAGNOSIS — L89893 Pressure ulcer of other site, stage 3: Secondary | ICD-10-CM | POA: Diagnosis not present

## 2020-11-14 DIAGNOSIS — I110 Hypertensive heart disease with heart failure: Secondary | ICD-10-CM | POA: Diagnosis not present

## 2020-11-14 DIAGNOSIS — I4821 Permanent atrial fibrillation: Secondary | ICD-10-CM | POA: Diagnosis not present

## 2020-11-18 DIAGNOSIS — L89893 Pressure ulcer of other site, stage 3: Secondary | ICD-10-CM | POA: Diagnosis not present

## 2020-11-18 DIAGNOSIS — J4540 Moderate persistent asthma, uncomplicated: Secondary | ICD-10-CM | POA: Diagnosis not present

## 2020-11-18 DIAGNOSIS — I509 Heart failure, unspecified: Secondary | ICD-10-CM | POA: Diagnosis not present

## 2020-11-18 DIAGNOSIS — E119 Type 2 diabetes mellitus without complications: Secondary | ICD-10-CM | POA: Diagnosis not present

## 2020-11-18 DIAGNOSIS — I110 Hypertensive heart disease with heart failure: Secondary | ICD-10-CM | POA: Diagnosis not present

## 2020-11-18 DIAGNOSIS — Z7901 Long term (current) use of anticoagulants: Secondary | ICD-10-CM | POA: Diagnosis not present

## 2020-11-18 DIAGNOSIS — I4821 Permanent atrial fibrillation: Secondary | ICD-10-CM | POA: Diagnosis not present

## 2020-11-21 DIAGNOSIS — L89893 Pressure ulcer of other site, stage 3: Secondary | ICD-10-CM | POA: Diagnosis not present

## 2020-11-21 DIAGNOSIS — E119 Type 2 diabetes mellitus without complications: Secondary | ICD-10-CM | POA: Diagnosis not present

## 2020-11-21 DIAGNOSIS — I509 Heart failure, unspecified: Secondary | ICD-10-CM | POA: Diagnosis not present

## 2020-11-21 DIAGNOSIS — I110 Hypertensive heart disease with heart failure: Secondary | ICD-10-CM | POA: Diagnosis not present

## 2020-11-21 DIAGNOSIS — I4821 Permanent atrial fibrillation: Secondary | ICD-10-CM | POA: Diagnosis not present

## 2020-11-21 DIAGNOSIS — J4540 Moderate persistent asthma, uncomplicated: Secondary | ICD-10-CM | POA: Diagnosis not present

## 2020-11-25 DIAGNOSIS — I110 Hypertensive heart disease with heart failure: Secondary | ICD-10-CM | POA: Diagnosis not present

## 2020-11-25 DIAGNOSIS — J4540 Moderate persistent asthma, uncomplicated: Secondary | ICD-10-CM | POA: Diagnosis not present

## 2020-11-25 DIAGNOSIS — I509 Heart failure, unspecified: Secondary | ICD-10-CM | POA: Diagnosis not present

## 2020-11-25 DIAGNOSIS — E119 Type 2 diabetes mellitus without complications: Secondary | ICD-10-CM | POA: Diagnosis not present

## 2020-11-25 DIAGNOSIS — L89893 Pressure ulcer of other site, stage 3: Secondary | ICD-10-CM | POA: Diagnosis not present

## 2020-11-25 DIAGNOSIS — I4821 Permanent atrial fibrillation: Secondary | ICD-10-CM | POA: Diagnosis not present

## 2020-11-27 DIAGNOSIS — M170 Bilateral primary osteoarthritis of knee: Secondary | ICD-10-CM | POA: Diagnosis not present

## 2020-11-28 DIAGNOSIS — E119 Type 2 diabetes mellitus without complications: Secondary | ICD-10-CM | POA: Diagnosis not present

## 2020-11-28 DIAGNOSIS — I110 Hypertensive heart disease with heart failure: Secondary | ICD-10-CM | POA: Diagnosis not present

## 2020-11-28 DIAGNOSIS — I509 Heart failure, unspecified: Secondary | ICD-10-CM | POA: Diagnosis not present

## 2020-11-28 DIAGNOSIS — J4540 Moderate persistent asthma, uncomplicated: Secondary | ICD-10-CM | POA: Diagnosis not present

## 2020-11-28 DIAGNOSIS — L89893 Pressure ulcer of other site, stage 3: Secondary | ICD-10-CM | POA: Diagnosis not present

## 2020-11-28 DIAGNOSIS — I4821 Permanent atrial fibrillation: Secondary | ICD-10-CM | POA: Diagnosis not present

## 2020-12-05 DIAGNOSIS — J4540 Moderate persistent asthma, uncomplicated: Secondary | ICD-10-CM | POA: Diagnosis not present

## 2020-12-05 DIAGNOSIS — I110 Hypertensive heart disease with heart failure: Secondary | ICD-10-CM | POA: Diagnosis not present

## 2020-12-05 DIAGNOSIS — I509 Heart failure, unspecified: Secondary | ICD-10-CM | POA: Diagnosis not present

## 2020-12-05 DIAGNOSIS — E119 Type 2 diabetes mellitus without complications: Secondary | ICD-10-CM | POA: Diagnosis not present

## 2020-12-05 DIAGNOSIS — I4821 Permanent atrial fibrillation: Secondary | ICD-10-CM | POA: Diagnosis not present

## 2020-12-05 DIAGNOSIS — L89893 Pressure ulcer of other site, stage 3: Secondary | ICD-10-CM | POA: Diagnosis not present

## 2020-12-07 DIAGNOSIS — I509 Heart failure, unspecified: Secondary | ICD-10-CM | POA: Diagnosis not present

## 2020-12-07 DIAGNOSIS — Z7901 Long term (current) use of anticoagulants: Secondary | ICD-10-CM | POA: Diagnosis not present

## 2020-12-07 DIAGNOSIS — Z9181 History of falling: Secondary | ICD-10-CM | POA: Diagnosis not present

## 2020-12-07 DIAGNOSIS — I4821 Permanent atrial fibrillation: Secondary | ICD-10-CM | POA: Diagnosis not present

## 2020-12-07 DIAGNOSIS — L89893 Pressure ulcer of other site, stage 3: Secondary | ICD-10-CM | POA: Diagnosis not present

## 2020-12-07 DIAGNOSIS — Z7951 Long term (current) use of inhaled steroids: Secondary | ICD-10-CM | POA: Diagnosis not present

## 2020-12-07 DIAGNOSIS — I110 Hypertensive heart disease with heart failure: Secondary | ICD-10-CM | POA: Diagnosis not present

## 2020-12-07 DIAGNOSIS — Z8616 Personal history of COVID-19: Secondary | ICD-10-CM | POA: Diagnosis not present

## 2020-12-07 DIAGNOSIS — J4540 Moderate persistent asthma, uncomplicated: Secondary | ICD-10-CM | POA: Diagnosis not present

## 2020-12-07 DIAGNOSIS — E119 Type 2 diabetes mellitus without complications: Secondary | ICD-10-CM | POA: Diagnosis not present

## 2020-12-07 DIAGNOSIS — Z87891 Personal history of nicotine dependence: Secondary | ICD-10-CM | POA: Diagnosis not present

## 2020-12-07 DIAGNOSIS — Z7984 Long term (current) use of oral hypoglycemic drugs: Secondary | ICD-10-CM | POA: Diagnosis not present

## 2020-12-09 DIAGNOSIS — Z1331 Encounter for screening for depression: Secondary | ICD-10-CM | POA: Diagnosis not present

## 2020-12-09 DIAGNOSIS — J4540 Moderate persistent asthma, uncomplicated: Secondary | ICD-10-CM | POA: Diagnosis not present

## 2020-12-09 DIAGNOSIS — I4821 Permanent atrial fibrillation: Secondary | ICD-10-CM | POA: Diagnosis not present

## 2020-12-09 DIAGNOSIS — M109 Gout, unspecified: Secondary | ICD-10-CM | POA: Diagnosis not present

## 2020-12-09 DIAGNOSIS — E118 Type 2 diabetes mellitus with unspecified complications: Secondary | ICD-10-CM | POA: Diagnosis not present

## 2020-12-09 DIAGNOSIS — Z7901 Long term (current) use of anticoagulants: Secondary | ICD-10-CM | POA: Diagnosis not present

## 2020-12-09 DIAGNOSIS — E7800 Pure hypercholesterolemia, unspecified: Secondary | ICD-10-CM | POA: Diagnosis not present

## 2020-12-09 DIAGNOSIS — M1990 Unspecified osteoarthritis, unspecified site: Secondary | ICD-10-CM | POA: Diagnosis not present

## 2020-12-09 DIAGNOSIS — Z1389 Encounter for screening for other disorder: Secondary | ICD-10-CM | POA: Diagnosis not present

## 2020-12-09 DIAGNOSIS — I110 Hypertensive heart disease with heart failure: Secondary | ICD-10-CM | POA: Diagnosis not present

## 2020-12-09 DIAGNOSIS — M5136 Other intervertebral disc degeneration, lumbar region: Secondary | ICD-10-CM | POA: Diagnosis not present

## 2020-12-09 DIAGNOSIS — Z993 Dependence on wheelchair: Secondary | ICD-10-CM | POA: Diagnosis not present

## 2020-12-09 DIAGNOSIS — I509 Heart failure, unspecified: Secondary | ICD-10-CM | POA: Diagnosis not present

## 2020-12-09 DIAGNOSIS — Z9981 Dependence on supplemental oxygen: Secondary | ICD-10-CM | POA: Diagnosis not present

## 2020-12-12 DIAGNOSIS — J4540 Moderate persistent asthma, uncomplicated: Secondary | ICD-10-CM | POA: Diagnosis not present

## 2020-12-12 DIAGNOSIS — I4821 Permanent atrial fibrillation: Secondary | ICD-10-CM | POA: Diagnosis not present

## 2020-12-12 DIAGNOSIS — E119 Type 2 diabetes mellitus without complications: Secondary | ICD-10-CM | POA: Diagnosis not present

## 2020-12-12 DIAGNOSIS — I110 Hypertensive heart disease with heart failure: Secondary | ICD-10-CM | POA: Diagnosis not present

## 2020-12-12 DIAGNOSIS — L89893 Pressure ulcer of other site, stage 3: Secondary | ICD-10-CM | POA: Diagnosis not present

## 2020-12-12 DIAGNOSIS — I509 Heart failure, unspecified: Secondary | ICD-10-CM | POA: Diagnosis not present

## 2020-12-17 DIAGNOSIS — I4821 Permanent atrial fibrillation: Secondary | ICD-10-CM | POA: Diagnosis not present

## 2020-12-17 DIAGNOSIS — L89893 Pressure ulcer of other site, stage 3: Secondary | ICD-10-CM | POA: Diagnosis not present

## 2020-12-17 DIAGNOSIS — J4540 Moderate persistent asthma, uncomplicated: Secondary | ICD-10-CM | POA: Diagnosis not present

## 2020-12-17 DIAGNOSIS — E119 Type 2 diabetes mellitus without complications: Secondary | ICD-10-CM | POA: Diagnosis not present

## 2020-12-17 DIAGNOSIS — I509 Heart failure, unspecified: Secondary | ICD-10-CM | POA: Diagnosis not present

## 2020-12-17 DIAGNOSIS — I110 Hypertensive heart disease with heart failure: Secondary | ICD-10-CM | POA: Diagnosis not present

## 2020-12-26 DIAGNOSIS — L89893 Pressure ulcer of other site, stage 3: Secondary | ICD-10-CM | POA: Diagnosis not present

## 2020-12-26 DIAGNOSIS — I4821 Permanent atrial fibrillation: Secondary | ICD-10-CM | POA: Diagnosis not present

## 2020-12-26 DIAGNOSIS — I509 Heart failure, unspecified: Secondary | ICD-10-CM | POA: Diagnosis not present

## 2020-12-26 DIAGNOSIS — E119 Type 2 diabetes mellitus without complications: Secondary | ICD-10-CM | POA: Diagnosis not present

## 2020-12-26 DIAGNOSIS — I110 Hypertensive heart disease with heart failure: Secondary | ICD-10-CM | POA: Diagnosis not present

## 2020-12-26 DIAGNOSIS — J4540 Moderate persistent asthma, uncomplicated: Secondary | ICD-10-CM | POA: Diagnosis not present

## 2021-01-01 DIAGNOSIS — J4540 Moderate persistent asthma, uncomplicated: Secondary | ICD-10-CM | POA: Diagnosis not present

## 2021-01-01 DIAGNOSIS — E119 Type 2 diabetes mellitus without complications: Secondary | ICD-10-CM | POA: Diagnosis not present

## 2021-01-01 DIAGNOSIS — L89893 Pressure ulcer of other site, stage 3: Secondary | ICD-10-CM | POA: Diagnosis not present

## 2021-01-01 DIAGNOSIS — I4821 Permanent atrial fibrillation: Secondary | ICD-10-CM | POA: Diagnosis not present

## 2021-01-01 DIAGNOSIS — I509 Heart failure, unspecified: Secondary | ICD-10-CM | POA: Diagnosis not present

## 2021-01-01 DIAGNOSIS — I110 Hypertensive heart disease with heart failure: Secondary | ICD-10-CM | POA: Diagnosis not present

## 2021-01-06 DIAGNOSIS — I110 Hypertensive heart disease with heart failure: Secondary | ICD-10-CM | POA: Diagnosis not present

## 2021-01-06 DIAGNOSIS — L89893 Pressure ulcer of other site, stage 3: Secondary | ICD-10-CM | POA: Diagnosis not present

## 2021-01-06 DIAGNOSIS — J4540 Moderate persistent asthma, uncomplicated: Secondary | ICD-10-CM | POA: Diagnosis not present

## 2021-01-06 DIAGNOSIS — Z9981 Dependence on supplemental oxygen: Secondary | ICD-10-CM | POA: Diagnosis not present

## 2021-01-06 DIAGNOSIS — E119 Type 2 diabetes mellitus without complications: Secondary | ICD-10-CM | POA: Diagnosis not present

## 2021-01-06 DIAGNOSIS — Z87891 Personal history of nicotine dependence: Secondary | ICD-10-CM | POA: Diagnosis not present

## 2021-01-06 DIAGNOSIS — Z7984 Long term (current) use of oral hypoglycemic drugs: Secondary | ICD-10-CM | POA: Diagnosis not present

## 2021-01-06 DIAGNOSIS — I4821 Permanent atrial fibrillation: Secondary | ICD-10-CM | POA: Diagnosis not present

## 2021-01-06 DIAGNOSIS — L89312 Pressure ulcer of right buttock, stage 2: Secondary | ICD-10-CM | POA: Diagnosis not present

## 2021-01-06 DIAGNOSIS — Z9181 History of falling: Secondary | ICD-10-CM | POA: Diagnosis not present

## 2021-01-06 DIAGNOSIS — I509 Heart failure, unspecified: Secondary | ICD-10-CM | POA: Diagnosis not present

## 2021-01-06 DIAGNOSIS — Z8616 Personal history of COVID-19: Secondary | ICD-10-CM | POA: Diagnosis not present

## 2021-01-09 DIAGNOSIS — L89893 Pressure ulcer of other site, stage 3: Secondary | ICD-10-CM | POA: Diagnosis not present

## 2021-01-09 DIAGNOSIS — I4821 Permanent atrial fibrillation: Secondary | ICD-10-CM | POA: Diagnosis not present

## 2021-01-09 DIAGNOSIS — I509 Heart failure, unspecified: Secondary | ICD-10-CM | POA: Diagnosis not present

## 2021-01-09 DIAGNOSIS — I110 Hypertensive heart disease with heart failure: Secondary | ICD-10-CM | POA: Diagnosis not present

## 2021-01-09 DIAGNOSIS — L89312 Pressure ulcer of right buttock, stage 2: Secondary | ICD-10-CM | POA: Diagnosis not present

## 2021-01-09 DIAGNOSIS — E119 Type 2 diabetes mellitus without complications: Secondary | ICD-10-CM | POA: Diagnosis not present

## 2021-01-14 DIAGNOSIS — I4821 Permanent atrial fibrillation: Secondary | ICD-10-CM | POA: Diagnosis not present

## 2021-01-14 DIAGNOSIS — Z7901 Long term (current) use of anticoagulants: Secondary | ICD-10-CM | POA: Diagnosis not present

## 2021-01-15 DIAGNOSIS — E119 Type 2 diabetes mellitus without complications: Secondary | ICD-10-CM | POA: Diagnosis not present

## 2021-01-15 DIAGNOSIS — I4821 Permanent atrial fibrillation: Secondary | ICD-10-CM | POA: Diagnosis not present

## 2021-01-15 DIAGNOSIS — I509 Heart failure, unspecified: Secondary | ICD-10-CM | POA: Diagnosis not present

## 2021-01-15 DIAGNOSIS — L89312 Pressure ulcer of right buttock, stage 2: Secondary | ICD-10-CM | POA: Diagnosis not present

## 2021-01-15 DIAGNOSIS — L89893 Pressure ulcer of other site, stage 3: Secondary | ICD-10-CM | POA: Diagnosis not present

## 2021-01-15 DIAGNOSIS — I110 Hypertensive heart disease with heart failure: Secondary | ICD-10-CM | POA: Diagnosis not present

## 2021-01-21 DIAGNOSIS — E119 Type 2 diabetes mellitus without complications: Secondary | ICD-10-CM | POA: Diagnosis not present

## 2021-01-21 DIAGNOSIS — I509 Heart failure, unspecified: Secondary | ICD-10-CM | POA: Diagnosis not present

## 2021-01-21 DIAGNOSIS — L89893 Pressure ulcer of other site, stage 3: Secondary | ICD-10-CM | POA: Diagnosis not present

## 2021-01-21 DIAGNOSIS — L89312 Pressure ulcer of right buttock, stage 2: Secondary | ICD-10-CM | POA: Diagnosis not present

## 2021-01-21 DIAGNOSIS — I4821 Permanent atrial fibrillation: Secondary | ICD-10-CM | POA: Diagnosis not present

## 2021-01-21 DIAGNOSIS — I110 Hypertensive heart disease with heart failure: Secondary | ICD-10-CM | POA: Diagnosis not present

## 2021-02-05 DIAGNOSIS — I509 Heart failure, unspecified: Secondary | ICD-10-CM | POA: Diagnosis not present

## 2021-02-05 DIAGNOSIS — L89312 Pressure ulcer of right buttock, stage 2: Secondary | ICD-10-CM | POA: Diagnosis not present

## 2021-02-05 DIAGNOSIS — Z9981 Dependence on supplemental oxygen: Secondary | ICD-10-CM | POA: Diagnosis not present

## 2021-02-05 DIAGNOSIS — J4540 Moderate persistent asthma, uncomplicated: Secondary | ICD-10-CM | POA: Diagnosis not present

## 2021-02-05 DIAGNOSIS — I110 Hypertensive heart disease with heart failure: Secondary | ICD-10-CM | POA: Diagnosis not present

## 2021-02-05 DIAGNOSIS — Z87891 Personal history of nicotine dependence: Secondary | ICD-10-CM | POA: Diagnosis not present

## 2021-02-05 DIAGNOSIS — Z8616 Personal history of COVID-19: Secondary | ICD-10-CM | POA: Diagnosis not present

## 2021-02-05 DIAGNOSIS — E119 Type 2 diabetes mellitus without complications: Secondary | ICD-10-CM | POA: Diagnosis not present

## 2021-02-05 DIAGNOSIS — L89893 Pressure ulcer of other site, stage 3: Secondary | ICD-10-CM | POA: Diagnosis not present

## 2021-02-05 DIAGNOSIS — Z9181 History of falling: Secondary | ICD-10-CM | POA: Diagnosis not present

## 2021-02-05 DIAGNOSIS — I4821 Permanent atrial fibrillation: Secondary | ICD-10-CM | POA: Diagnosis not present

## 2021-02-05 DIAGNOSIS — Z7984 Long term (current) use of oral hypoglycemic drugs: Secondary | ICD-10-CM | POA: Diagnosis not present

## 2021-02-12 DIAGNOSIS — Z7901 Long term (current) use of anticoagulants: Secondary | ICD-10-CM | POA: Diagnosis not present

## 2021-02-12 DIAGNOSIS — I4821 Permanent atrial fibrillation: Secondary | ICD-10-CM | POA: Diagnosis not present

## 2021-02-27 DIAGNOSIS — E119 Type 2 diabetes mellitus without complications: Secondary | ICD-10-CM | POA: Diagnosis not present

## 2021-03-11 DIAGNOSIS — M5136 Other intervertebral disc degeneration, lumbar region: Secondary | ICD-10-CM | POA: Diagnosis not present

## 2021-03-11 DIAGNOSIS — Z7901 Long term (current) use of anticoagulants: Secondary | ICD-10-CM | POA: Diagnosis not present

## 2021-03-11 DIAGNOSIS — M25512 Pain in left shoulder: Secondary | ICD-10-CM | POA: Diagnosis not present

## 2021-03-11 DIAGNOSIS — Z87891 Personal history of nicotine dependence: Secondary | ICD-10-CM | POA: Diagnosis not present

## 2021-03-11 DIAGNOSIS — I110 Hypertensive heart disease with heart failure: Secondary | ICD-10-CM | POA: Diagnosis not present

## 2021-03-11 DIAGNOSIS — R32 Unspecified urinary incontinence: Secondary | ICD-10-CM | POA: Diagnosis not present

## 2021-03-11 DIAGNOSIS — R079 Chest pain, unspecified: Secondary | ICD-10-CM | POA: Diagnosis not present

## 2021-03-11 DIAGNOSIS — E119 Type 2 diabetes mellitus without complications: Secondary | ICD-10-CM | POA: Diagnosis not present

## 2021-03-11 DIAGNOSIS — J4540 Moderate persistent asthma, uncomplicated: Secondary | ICD-10-CM | POA: Diagnosis not present

## 2021-03-11 DIAGNOSIS — I509 Heart failure, unspecified: Secondary | ICD-10-CM | POA: Diagnosis not present

## 2021-03-11 DIAGNOSIS — M109 Gout, unspecified: Secondary | ICD-10-CM | POA: Diagnosis not present

## 2021-03-11 DIAGNOSIS — E7800 Pure hypercholesterolemia, unspecified: Secondary | ICD-10-CM | POA: Diagnosis not present

## 2021-03-11 DIAGNOSIS — M436 Torticollis: Secondary | ICD-10-CM | POA: Diagnosis not present

## 2021-03-11 DIAGNOSIS — M47812 Spondylosis without myelopathy or radiculopathy, cervical region: Secondary | ICD-10-CM | POA: Diagnosis not present

## 2021-03-11 DIAGNOSIS — Z993 Dependence on wheelchair: Secondary | ICD-10-CM | POA: Diagnosis not present

## 2021-03-11 DIAGNOSIS — I4821 Permanent atrial fibrillation: Secondary | ICD-10-CM | POA: Diagnosis not present

## 2021-03-11 DIAGNOSIS — Z8616 Personal history of COVID-19: Secondary | ICD-10-CM | POA: Diagnosis not present

## 2021-03-11 DIAGNOSIS — Z7984 Long term (current) use of oral hypoglycemic drugs: Secondary | ICD-10-CM | POA: Diagnosis not present

## 2021-03-11 DIAGNOSIS — Z9981 Dependence on supplemental oxygen: Secondary | ICD-10-CM | POA: Diagnosis not present

## 2021-03-14 DIAGNOSIS — R079 Chest pain, unspecified: Secondary | ICD-10-CM | POA: Diagnosis not present

## 2021-03-14 DIAGNOSIS — E119 Type 2 diabetes mellitus without complications: Secondary | ICD-10-CM | POA: Diagnosis not present

## 2021-03-14 DIAGNOSIS — M25512 Pain in left shoulder: Secondary | ICD-10-CM | POA: Diagnosis not present

## 2021-03-14 DIAGNOSIS — I110 Hypertensive heart disease with heart failure: Secondary | ICD-10-CM | POA: Diagnosis not present

## 2021-03-14 DIAGNOSIS — M47812 Spondylosis without myelopathy or radiculopathy, cervical region: Secondary | ICD-10-CM | POA: Diagnosis not present

## 2021-03-14 DIAGNOSIS — I509 Heart failure, unspecified: Secondary | ICD-10-CM | POA: Diagnosis not present

## 2021-03-19 DIAGNOSIS — I4821 Permanent atrial fibrillation: Secondary | ICD-10-CM | POA: Diagnosis not present

## 2021-03-19 DIAGNOSIS — Z23 Encounter for immunization: Secondary | ICD-10-CM | POA: Diagnosis not present

## 2021-03-19 DIAGNOSIS — Z7901 Long term (current) use of anticoagulants: Secondary | ICD-10-CM | POA: Diagnosis not present

## 2021-03-20 DIAGNOSIS — E119 Type 2 diabetes mellitus without complications: Secondary | ICD-10-CM | POA: Diagnosis not present

## 2021-03-20 DIAGNOSIS — M25512 Pain in left shoulder: Secondary | ICD-10-CM | POA: Diagnosis not present

## 2021-03-20 DIAGNOSIS — R079 Chest pain, unspecified: Secondary | ICD-10-CM | POA: Diagnosis not present

## 2021-03-20 DIAGNOSIS — I110 Hypertensive heart disease with heart failure: Secondary | ICD-10-CM | POA: Diagnosis not present

## 2021-03-20 DIAGNOSIS — I509 Heart failure, unspecified: Secondary | ICD-10-CM | POA: Diagnosis not present

## 2021-03-20 DIAGNOSIS — M47812 Spondylosis without myelopathy or radiculopathy, cervical region: Secondary | ICD-10-CM | POA: Diagnosis not present

## 2021-03-21 DIAGNOSIS — M47812 Spondylosis without myelopathy or radiculopathy, cervical region: Secondary | ICD-10-CM | POA: Diagnosis not present

## 2021-03-21 DIAGNOSIS — R079 Chest pain, unspecified: Secondary | ICD-10-CM | POA: Diagnosis not present

## 2021-03-21 DIAGNOSIS — E119 Type 2 diabetes mellitus without complications: Secondary | ICD-10-CM | POA: Diagnosis not present

## 2021-03-21 DIAGNOSIS — M25512 Pain in left shoulder: Secondary | ICD-10-CM | POA: Diagnosis not present

## 2021-03-21 DIAGNOSIS — I110 Hypertensive heart disease with heart failure: Secondary | ICD-10-CM | POA: Diagnosis not present

## 2021-03-21 DIAGNOSIS — I509 Heart failure, unspecified: Secondary | ICD-10-CM | POA: Diagnosis not present

## 2021-03-25 DIAGNOSIS — I110 Hypertensive heart disease with heart failure: Secondary | ICD-10-CM | POA: Diagnosis not present

## 2021-03-25 DIAGNOSIS — M25512 Pain in left shoulder: Secondary | ICD-10-CM | POA: Diagnosis not present

## 2021-03-25 DIAGNOSIS — I509 Heart failure, unspecified: Secondary | ICD-10-CM | POA: Diagnosis not present

## 2021-03-25 DIAGNOSIS — M47812 Spondylosis without myelopathy or radiculopathy, cervical region: Secondary | ICD-10-CM | POA: Diagnosis not present

## 2021-03-25 DIAGNOSIS — E119 Type 2 diabetes mellitus without complications: Secondary | ICD-10-CM | POA: Diagnosis not present

## 2021-03-25 DIAGNOSIS — R079 Chest pain, unspecified: Secondary | ICD-10-CM | POA: Diagnosis not present

## 2021-03-27 DIAGNOSIS — I110 Hypertensive heart disease with heart failure: Secondary | ICD-10-CM | POA: Diagnosis not present

## 2021-03-27 DIAGNOSIS — I509 Heart failure, unspecified: Secondary | ICD-10-CM | POA: Diagnosis not present

## 2021-03-27 DIAGNOSIS — R079 Chest pain, unspecified: Secondary | ICD-10-CM | POA: Diagnosis not present

## 2021-03-27 DIAGNOSIS — M25512 Pain in left shoulder: Secondary | ICD-10-CM | POA: Diagnosis not present

## 2021-03-27 DIAGNOSIS — M47812 Spondylosis without myelopathy or radiculopathy, cervical region: Secondary | ICD-10-CM | POA: Diagnosis not present

## 2021-03-27 DIAGNOSIS — E119 Type 2 diabetes mellitus without complications: Secondary | ICD-10-CM | POA: Diagnosis not present

## 2021-04-02 DIAGNOSIS — Z7901 Long term (current) use of anticoagulants: Secondary | ICD-10-CM | POA: Diagnosis not present

## 2021-04-02 DIAGNOSIS — I4821 Permanent atrial fibrillation: Secondary | ICD-10-CM | POA: Diagnosis not present

## 2021-04-04 DIAGNOSIS — R079 Chest pain, unspecified: Secondary | ICD-10-CM | POA: Diagnosis not present

## 2021-04-04 DIAGNOSIS — M47812 Spondylosis without myelopathy or radiculopathy, cervical region: Secondary | ICD-10-CM | POA: Diagnosis not present

## 2021-04-04 DIAGNOSIS — M25512 Pain in left shoulder: Secondary | ICD-10-CM | POA: Diagnosis not present

## 2021-04-04 DIAGNOSIS — I509 Heart failure, unspecified: Secondary | ICD-10-CM | POA: Diagnosis not present

## 2021-04-04 DIAGNOSIS — E119 Type 2 diabetes mellitus without complications: Secondary | ICD-10-CM | POA: Diagnosis not present

## 2021-04-04 DIAGNOSIS — I110 Hypertensive heart disease with heart failure: Secondary | ICD-10-CM | POA: Diagnosis not present

## 2021-04-08 DIAGNOSIS — M25512 Pain in left shoulder: Secondary | ICD-10-CM | POA: Diagnosis not present

## 2021-04-08 DIAGNOSIS — I110 Hypertensive heart disease with heart failure: Secondary | ICD-10-CM | POA: Diagnosis not present

## 2021-04-08 DIAGNOSIS — I509 Heart failure, unspecified: Secondary | ICD-10-CM | POA: Diagnosis not present

## 2021-04-08 DIAGNOSIS — M47812 Spondylosis without myelopathy or radiculopathy, cervical region: Secondary | ICD-10-CM | POA: Diagnosis not present

## 2021-04-08 DIAGNOSIS — R079 Chest pain, unspecified: Secondary | ICD-10-CM | POA: Diagnosis not present

## 2021-04-08 DIAGNOSIS — E119 Type 2 diabetes mellitus without complications: Secondary | ICD-10-CM | POA: Diagnosis not present

## 2021-04-10 DIAGNOSIS — E7800 Pure hypercholesterolemia, unspecified: Secondary | ICD-10-CM | POA: Diagnosis not present

## 2021-04-10 DIAGNOSIS — M25512 Pain in left shoulder: Secondary | ICD-10-CM | POA: Diagnosis not present

## 2021-04-10 DIAGNOSIS — R32 Unspecified urinary incontinence: Secondary | ICD-10-CM | POA: Diagnosis not present

## 2021-04-10 DIAGNOSIS — J4540 Moderate persistent asthma, uncomplicated: Secondary | ICD-10-CM | POA: Diagnosis not present

## 2021-04-10 DIAGNOSIS — Z7984 Long term (current) use of oral hypoglycemic drugs: Secondary | ICD-10-CM | POA: Diagnosis not present

## 2021-04-10 DIAGNOSIS — I509 Heart failure, unspecified: Secondary | ICD-10-CM | POA: Diagnosis not present

## 2021-04-10 DIAGNOSIS — I110 Hypertensive heart disease with heart failure: Secondary | ICD-10-CM | POA: Diagnosis not present

## 2021-04-10 DIAGNOSIS — Z7901 Long term (current) use of anticoagulants: Secondary | ICD-10-CM | POA: Diagnosis not present

## 2021-04-10 DIAGNOSIS — M436 Torticollis: Secondary | ICD-10-CM | POA: Diagnosis not present

## 2021-04-10 DIAGNOSIS — R079 Chest pain, unspecified: Secondary | ICD-10-CM | POA: Diagnosis not present

## 2021-04-10 DIAGNOSIS — Z993 Dependence on wheelchair: Secondary | ICD-10-CM | POA: Diagnosis not present

## 2021-04-10 DIAGNOSIS — M47812 Spondylosis without myelopathy or radiculopathy, cervical region: Secondary | ICD-10-CM | POA: Diagnosis not present

## 2021-04-10 DIAGNOSIS — M109 Gout, unspecified: Secondary | ICD-10-CM | POA: Diagnosis not present

## 2021-04-10 DIAGNOSIS — Z8616 Personal history of COVID-19: Secondary | ICD-10-CM | POA: Diagnosis not present

## 2021-04-10 DIAGNOSIS — M5136 Other intervertebral disc degeneration, lumbar region: Secondary | ICD-10-CM | POA: Diagnosis not present

## 2021-04-10 DIAGNOSIS — I4821 Permanent atrial fibrillation: Secondary | ICD-10-CM | POA: Diagnosis not present

## 2021-04-10 DIAGNOSIS — Z9981 Dependence on supplemental oxygen: Secondary | ICD-10-CM | POA: Diagnosis not present

## 2021-04-10 DIAGNOSIS — E119 Type 2 diabetes mellitus without complications: Secondary | ICD-10-CM | POA: Diagnosis not present

## 2021-04-10 DIAGNOSIS — Z87891 Personal history of nicotine dependence: Secondary | ICD-10-CM | POA: Diagnosis not present

## 2021-04-16 DIAGNOSIS — I110 Hypertensive heart disease with heart failure: Secondary | ICD-10-CM | POA: Diagnosis not present

## 2021-04-16 DIAGNOSIS — M47812 Spondylosis without myelopathy or radiculopathy, cervical region: Secondary | ICD-10-CM | POA: Diagnosis not present

## 2021-04-16 DIAGNOSIS — I509 Heart failure, unspecified: Secondary | ICD-10-CM | POA: Diagnosis not present

## 2021-04-16 DIAGNOSIS — R079 Chest pain, unspecified: Secondary | ICD-10-CM | POA: Diagnosis not present

## 2021-04-16 DIAGNOSIS — M25512 Pain in left shoulder: Secondary | ICD-10-CM | POA: Diagnosis not present

## 2021-04-16 DIAGNOSIS — E119 Type 2 diabetes mellitus without complications: Secondary | ICD-10-CM | POA: Diagnosis not present

## 2021-04-17 DIAGNOSIS — Z7901 Long term (current) use of anticoagulants: Secondary | ICD-10-CM | POA: Diagnosis not present

## 2021-04-17 DIAGNOSIS — I4821 Permanent atrial fibrillation: Secondary | ICD-10-CM | POA: Diagnosis not present

## 2021-04-18 DIAGNOSIS — I509 Heart failure, unspecified: Secondary | ICD-10-CM | POA: Diagnosis not present

## 2021-04-18 DIAGNOSIS — M25512 Pain in left shoulder: Secondary | ICD-10-CM | POA: Diagnosis not present

## 2021-04-18 DIAGNOSIS — I110 Hypertensive heart disease with heart failure: Secondary | ICD-10-CM | POA: Diagnosis not present

## 2021-04-18 DIAGNOSIS — R079 Chest pain, unspecified: Secondary | ICD-10-CM | POA: Diagnosis not present

## 2021-04-18 DIAGNOSIS — E119 Type 2 diabetes mellitus without complications: Secondary | ICD-10-CM | POA: Diagnosis not present

## 2021-04-18 DIAGNOSIS — M47812 Spondylosis without myelopathy or radiculopathy, cervical region: Secondary | ICD-10-CM | POA: Diagnosis not present

## 2021-04-23 DIAGNOSIS — M47812 Spondylosis without myelopathy or radiculopathy, cervical region: Secondary | ICD-10-CM | POA: Diagnosis not present

## 2021-04-23 DIAGNOSIS — E119 Type 2 diabetes mellitus without complications: Secondary | ICD-10-CM | POA: Diagnosis not present

## 2021-04-23 DIAGNOSIS — I110 Hypertensive heart disease with heart failure: Secondary | ICD-10-CM | POA: Diagnosis not present

## 2021-04-23 DIAGNOSIS — M25512 Pain in left shoulder: Secondary | ICD-10-CM | POA: Diagnosis not present

## 2021-04-23 DIAGNOSIS — I509 Heart failure, unspecified: Secondary | ICD-10-CM | POA: Diagnosis not present

## 2021-04-23 DIAGNOSIS — R079 Chest pain, unspecified: Secondary | ICD-10-CM | POA: Diagnosis not present

## 2021-04-25 DIAGNOSIS — E119 Type 2 diabetes mellitus without complications: Secondary | ICD-10-CM | POA: Diagnosis not present

## 2021-04-25 DIAGNOSIS — M47812 Spondylosis without myelopathy or radiculopathy, cervical region: Secondary | ICD-10-CM | POA: Diagnosis not present

## 2021-04-25 DIAGNOSIS — I509 Heart failure, unspecified: Secondary | ICD-10-CM | POA: Diagnosis not present

## 2021-04-25 DIAGNOSIS — M25512 Pain in left shoulder: Secondary | ICD-10-CM | POA: Diagnosis not present

## 2021-04-25 DIAGNOSIS — R079 Chest pain, unspecified: Secondary | ICD-10-CM | POA: Diagnosis not present

## 2021-04-25 DIAGNOSIS — I110 Hypertensive heart disease with heart failure: Secondary | ICD-10-CM | POA: Diagnosis not present

## 2021-04-29 DIAGNOSIS — M47812 Spondylosis without myelopathy or radiculopathy, cervical region: Secondary | ICD-10-CM | POA: Diagnosis not present

## 2021-04-29 DIAGNOSIS — I509 Heart failure, unspecified: Secondary | ICD-10-CM | POA: Diagnosis not present

## 2021-04-29 DIAGNOSIS — I110 Hypertensive heart disease with heart failure: Secondary | ICD-10-CM | POA: Diagnosis not present

## 2021-04-29 DIAGNOSIS — E119 Type 2 diabetes mellitus without complications: Secondary | ICD-10-CM | POA: Diagnosis not present

## 2021-04-29 DIAGNOSIS — M25512 Pain in left shoulder: Secondary | ICD-10-CM | POA: Diagnosis not present

## 2021-04-29 DIAGNOSIS — R079 Chest pain, unspecified: Secondary | ICD-10-CM | POA: Diagnosis not present

## 2021-04-30 DIAGNOSIS — M25512 Pain in left shoulder: Secondary | ICD-10-CM | POA: Diagnosis not present

## 2021-04-30 DIAGNOSIS — E119 Type 2 diabetes mellitus without complications: Secondary | ICD-10-CM | POA: Diagnosis not present

## 2021-04-30 DIAGNOSIS — M47812 Spondylosis without myelopathy or radiculopathy, cervical region: Secondary | ICD-10-CM | POA: Diagnosis not present

## 2021-04-30 DIAGNOSIS — I110 Hypertensive heart disease with heart failure: Secondary | ICD-10-CM | POA: Diagnosis not present

## 2021-04-30 DIAGNOSIS — I509 Heart failure, unspecified: Secondary | ICD-10-CM | POA: Diagnosis not present

## 2021-04-30 DIAGNOSIS — R079 Chest pain, unspecified: Secondary | ICD-10-CM | POA: Diagnosis not present

## 2021-05-05 DIAGNOSIS — E119 Type 2 diabetes mellitus without complications: Secondary | ICD-10-CM | POA: Diagnosis not present

## 2021-05-05 DIAGNOSIS — R079 Chest pain, unspecified: Secondary | ICD-10-CM | POA: Diagnosis not present

## 2021-05-05 DIAGNOSIS — M47812 Spondylosis without myelopathy or radiculopathy, cervical region: Secondary | ICD-10-CM | POA: Diagnosis not present

## 2021-05-05 DIAGNOSIS — I509 Heart failure, unspecified: Secondary | ICD-10-CM | POA: Diagnosis not present

## 2021-05-05 DIAGNOSIS — I110 Hypertensive heart disease with heart failure: Secondary | ICD-10-CM | POA: Diagnosis not present

## 2021-05-05 DIAGNOSIS — M25512 Pain in left shoulder: Secondary | ICD-10-CM | POA: Diagnosis not present

## 2021-05-07 DIAGNOSIS — M25512 Pain in left shoulder: Secondary | ICD-10-CM | POA: Diagnosis not present

## 2021-05-07 DIAGNOSIS — R079 Chest pain, unspecified: Secondary | ICD-10-CM | POA: Diagnosis not present

## 2021-05-07 DIAGNOSIS — E119 Type 2 diabetes mellitus without complications: Secondary | ICD-10-CM | POA: Diagnosis not present

## 2021-05-07 DIAGNOSIS — I509 Heart failure, unspecified: Secondary | ICD-10-CM | POA: Diagnosis not present

## 2021-05-07 DIAGNOSIS — I110 Hypertensive heart disease with heart failure: Secondary | ICD-10-CM | POA: Diagnosis not present

## 2021-05-07 DIAGNOSIS — M47812 Spondylosis without myelopathy or radiculopathy, cervical region: Secondary | ICD-10-CM | POA: Diagnosis not present

## 2021-05-09 DIAGNOSIS — M436 Torticollis: Secondary | ICD-10-CM | POA: Diagnosis not present

## 2021-05-09 DIAGNOSIS — L89813 Pressure ulcer of head, stage 3: Secondary | ICD-10-CM | POA: Diagnosis not present

## 2021-05-09 DIAGNOSIS — B372 Candidiasis of skin and nail: Secondary | ICD-10-CM | POA: Diagnosis not present

## 2021-05-09 DIAGNOSIS — M47812 Spondylosis without myelopathy or radiculopathy, cervical region: Secondary | ICD-10-CM | POA: Diagnosis not present

## 2021-05-10 DIAGNOSIS — R32 Unspecified urinary incontinence: Secondary | ICD-10-CM | POA: Diagnosis not present

## 2021-05-10 DIAGNOSIS — M109 Gout, unspecified: Secondary | ICD-10-CM | POA: Diagnosis not present

## 2021-05-10 DIAGNOSIS — I110 Hypertensive heart disease with heart failure: Secondary | ICD-10-CM | POA: Diagnosis not present

## 2021-05-10 DIAGNOSIS — M25512 Pain in left shoulder: Secondary | ICD-10-CM | POA: Diagnosis not present

## 2021-05-10 DIAGNOSIS — I4821 Permanent atrial fibrillation: Secondary | ICD-10-CM | POA: Diagnosis not present

## 2021-05-10 DIAGNOSIS — M436 Torticollis: Secondary | ICD-10-CM | POA: Diagnosis not present

## 2021-05-10 DIAGNOSIS — R079 Chest pain, unspecified: Secondary | ICD-10-CM | POA: Diagnosis not present

## 2021-05-10 DIAGNOSIS — E119 Type 2 diabetes mellitus without complications: Secondary | ICD-10-CM | POA: Diagnosis not present

## 2021-05-10 DIAGNOSIS — M5136 Other intervertebral disc degeneration, lumbar region: Secondary | ICD-10-CM | POA: Diagnosis not present

## 2021-05-10 DIAGNOSIS — J4540 Moderate persistent asthma, uncomplicated: Secondary | ICD-10-CM | POA: Diagnosis not present

## 2021-05-10 DIAGNOSIS — M47812 Spondylosis without myelopathy or radiculopathy, cervical region: Secondary | ICD-10-CM | POA: Diagnosis not present

## 2021-05-10 DIAGNOSIS — I509 Heart failure, unspecified: Secondary | ICD-10-CM | POA: Diagnosis not present

## 2021-05-13 DIAGNOSIS — Z7901 Long term (current) use of anticoagulants: Secondary | ICD-10-CM | POA: Diagnosis not present

## 2021-05-13 DIAGNOSIS — I4821 Permanent atrial fibrillation: Secondary | ICD-10-CM | POA: Diagnosis not present

## 2021-05-14 DIAGNOSIS — Z7901 Long term (current) use of anticoagulants: Secondary | ICD-10-CM | POA: Diagnosis not present

## 2021-05-14 DIAGNOSIS — E119 Type 2 diabetes mellitus without complications: Secondary | ICD-10-CM | POA: Diagnosis not present

## 2021-05-14 DIAGNOSIS — E785 Hyperlipidemia, unspecified: Secondary | ICD-10-CM | POA: Diagnosis not present

## 2021-05-14 DIAGNOSIS — L89893 Pressure ulcer of other site, stage 3: Secondary | ICD-10-CM | POA: Diagnosis not present

## 2021-05-14 DIAGNOSIS — I10 Essential (primary) hypertension: Secondary | ICD-10-CM | POA: Diagnosis not present

## 2021-05-14 DIAGNOSIS — Z8616 Personal history of COVID-19: Secondary | ICD-10-CM | POA: Diagnosis not present

## 2021-05-14 DIAGNOSIS — Z7951 Long term (current) use of inhaled steroids: Secondary | ICD-10-CM | POA: Diagnosis not present

## 2021-05-14 DIAGNOSIS — Z9981 Dependence on supplemental oxygen: Secondary | ICD-10-CM | POA: Diagnosis not present

## 2021-05-14 DIAGNOSIS — Z7984 Long term (current) use of oral hypoglycemic drugs: Secondary | ICD-10-CM | POA: Diagnosis not present

## 2021-05-14 DIAGNOSIS — J45909 Unspecified asthma, uncomplicated: Secondary | ICD-10-CM | POA: Diagnosis not present

## 2021-05-14 DIAGNOSIS — Z87891 Personal history of nicotine dependence: Secondary | ICD-10-CM | POA: Diagnosis not present

## 2021-05-14 DIAGNOSIS — Z993 Dependence on wheelchair: Secondary | ICD-10-CM | POA: Diagnosis not present

## 2021-05-14 DIAGNOSIS — I4821 Permanent atrial fibrillation: Secondary | ICD-10-CM | POA: Diagnosis not present

## 2021-05-14 DIAGNOSIS — M47812 Spondylosis without myelopathy or radiculopathy, cervical region: Secondary | ICD-10-CM | POA: Diagnosis not present

## 2021-05-14 DIAGNOSIS — M436 Torticollis: Secondary | ICD-10-CM | POA: Diagnosis not present

## 2021-05-14 DIAGNOSIS — B372 Candidiasis of skin and nail: Secondary | ICD-10-CM | POA: Diagnosis not present

## 2021-05-15 DIAGNOSIS — E119 Type 2 diabetes mellitus without complications: Secondary | ICD-10-CM | POA: Diagnosis not present

## 2021-05-15 DIAGNOSIS — J45909 Unspecified asthma, uncomplicated: Secondary | ICD-10-CM | POA: Diagnosis not present

## 2021-05-15 DIAGNOSIS — L89893 Pressure ulcer of other site, stage 3: Secondary | ICD-10-CM | POA: Diagnosis not present

## 2021-05-15 DIAGNOSIS — I10 Essential (primary) hypertension: Secondary | ICD-10-CM | POA: Diagnosis not present

## 2021-05-15 DIAGNOSIS — B372 Candidiasis of skin and nail: Secondary | ICD-10-CM | POA: Diagnosis not present

## 2021-05-15 DIAGNOSIS — I4821 Permanent atrial fibrillation: Secondary | ICD-10-CM | POA: Diagnosis not present

## 2021-05-16 DIAGNOSIS — I4821 Permanent atrial fibrillation: Secondary | ICD-10-CM | POA: Diagnosis not present

## 2021-05-16 DIAGNOSIS — B372 Candidiasis of skin and nail: Secondary | ICD-10-CM | POA: Diagnosis not present

## 2021-05-16 DIAGNOSIS — L89893 Pressure ulcer of other site, stage 3: Secondary | ICD-10-CM | POA: Diagnosis not present

## 2021-05-16 DIAGNOSIS — J45909 Unspecified asthma, uncomplicated: Secondary | ICD-10-CM | POA: Diagnosis not present

## 2021-05-16 DIAGNOSIS — E119 Type 2 diabetes mellitus without complications: Secondary | ICD-10-CM | POA: Diagnosis not present

## 2021-05-16 DIAGNOSIS — I10 Essential (primary) hypertension: Secondary | ICD-10-CM | POA: Diagnosis not present

## 2021-05-20 DIAGNOSIS — L89893 Pressure ulcer of other site, stage 3: Secondary | ICD-10-CM | POA: Diagnosis not present

## 2021-05-20 DIAGNOSIS — I10 Essential (primary) hypertension: Secondary | ICD-10-CM | POA: Diagnosis not present

## 2021-05-20 DIAGNOSIS — E119 Type 2 diabetes mellitus without complications: Secondary | ICD-10-CM | POA: Diagnosis not present

## 2021-05-20 DIAGNOSIS — J45909 Unspecified asthma, uncomplicated: Secondary | ICD-10-CM | POA: Diagnosis not present

## 2021-05-20 DIAGNOSIS — B372 Candidiasis of skin and nail: Secondary | ICD-10-CM | POA: Diagnosis not present

## 2021-05-20 DIAGNOSIS — I4821 Permanent atrial fibrillation: Secondary | ICD-10-CM | POA: Diagnosis not present

## 2021-05-21 DIAGNOSIS — I4821 Permanent atrial fibrillation: Secondary | ICD-10-CM | POA: Diagnosis not present

## 2021-05-21 DIAGNOSIS — J45909 Unspecified asthma, uncomplicated: Secondary | ICD-10-CM | POA: Diagnosis not present

## 2021-05-21 DIAGNOSIS — E119 Type 2 diabetes mellitus without complications: Secondary | ICD-10-CM | POA: Diagnosis not present

## 2021-05-21 DIAGNOSIS — B372 Candidiasis of skin and nail: Secondary | ICD-10-CM | POA: Diagnosis not present

## 2021-05-21 DIAGNOSIS — I10 Essential (primary) hypertension: Secondary | ICD-10-CM | POA: Diagnosis not present

## 2021-05-21 DIAGNOSIS — L89893 Pressure ulcer of other site, stage 3: Secondary | ICD-10-CM | POA: Diagnosis not present

## 2021-05-23 DIAGNOSIS — L89893 Pressure ulcer of other site, stage 3: Secondary | ICD-10-CM | POA: Diagnosis not present

## 2021-05-23 DIAGNOSIS — J45909 Unspecified asthma, uncomplicated: Secondary | ICD-10-CM | POA: Diagnosis not present

## 2021-05-23 DIAGNOSIS — E119 Type 2 diabetes mellitus without complications: Secondary | ICD-10-CM | POA: Diagnosis not present

## 2021-05-23 DIAGNOSIS — B372 Candidiasis of skin and nail: Secondary | ICD-10-CM | POA: Diagnosis not present

## 2021-05-23 DIAGNOSIS — I10 Essential (primary) hypertension: Secondary | ICD-10-CM | POA: Diagnosis not present

## 2021-05-23 DIAGNOSIS — I4821 Permanent atrial fibrillation: Secondary | ICD-10-CM | POA: Diagnosis not present

## 2021-05-27 DIAGNOSIS — J45909 Unspecified asthma, uncomplicated: Secondary | ICD-10-CM | POA: Diagnosis not present

## 2021-05-27 DIAGNOSIS — B372 Candidiasis of skin and nail: Secondary | ICD-10-CM | POA: Diagnosis not present

## 2021-05-27 DIAGNOSIS — I4821 Permanent atrial fibrillation: Secondary | ICD-10-CM | POA: Diagnosis not present

## 2021-05-27 DIAGNOSIS — L89893 Pressure ulcer of other site, stage 3: Secondary | ICD-10-CM | POA: Diagnosis not present

## 2021-05-27 DIAGNOSIS — I10 Essential (primary) hypertension: Secondary | ICD-10-CM | POA: Diagnosis not present

## 2021-05-27 DIAGNOSIS — E119 Type 2 diabetes mellitus without complications: Secondary | ICD-10-CM | POA: Diagnosis not present

## 2021-05-29 DIAGNOSIS — I4821 Permanent atrial fibrillation: Secondary | ICD-10-CM | POA: Diagnosis not present

## 2021-05-29 DIAGNOSIS — J45909 Unspecified asthma, uncomplicated: Secondary | ICD-10-CM | POA: Diagnosis not present

## 2021-05-29 DIAGNOSIS — E119 Type 2 diabetes mellitus without complications: Secondary | ICD-10-CM | POA: Diagnosis not present

## 2021-05-29 DIAGNOSIS — I10 Essential (primary) hypertension: Secondary | ICD-10-CM | POA: Diagnosis not present

## 2021-05-29 DIAGNOSIS — B372 Candidiasis of skin and nail: Secondary | ICD-10-CM | POA: Diagnosis not present

## 2021-05-29 DIAGNOSIS — L89893 Pressure ulcer of other site, stage 3: Secondary | ICD-10-CM | POA: Diagnosis not present

## 2021-05-30 DIAGNOSIS — I4821 Permanent atrial fibrillation: Secondary | ICD-10-CM | POA: Diagnosis not present

## 2021-05-30 DIAGNOSIS — E119 Type 2 diabetes mellitus without complications: Secondary | ICD-10-CM | POA: Diagnosis not present

## 2021-05-30 DIAGNOSIS — I10 Essential (primary) hypertension: Secondary | ICD-10-CM | POA: Diagnosis not present

## 2021-05-30 DIAGNOSIS — B372 Candidiasis of skin and nail: Secondary | ICD-10-CM | POA: Diagnosis not present

## 2021-05-30 DIAGNOSIS — J45909 Unspecified asthma, uncomplicated: Secondary | ICD-10-CM | POA: Diagnosis not present

## 2021-05-30 DIAGNOSIS — L89893 Pressure ulcer of other site, stage 3: Secondary | ICD-10-CM | POA: Diagnosis not present

## 2021-06-03 DIAGNOSIS — J45909 Unspecified asthma, uncomplicated: Secondary | ICD-10-CM | POA: Diagnosis not present

## 2021-06-03 DIAGNOSIS — L89893 Pressure ulcer of other site, stage 3: Secondary | ICD-10-CM | POA: Diagnosis not present

## 2021-06-03 DIAGNOSIS — B372 Candidiasis of skin and nail: Secondary | ICD-10-CM | POA: Diagnosis not present

## 2021-06-03 DIAGNOSIS — I10 Essential (primary) hypertension: Secondary | ICD-10-CM | POA: Diagnosis not present

## 2021-06-03 DIAGNOSIS — I4821 Permanent atrial fibrillation: Secondary | ICD-10-CM | POA: Diagnosis not present

## 2021-06-03 DIAGNOSIS — E119 Type 2 diabetes mellitus without complications: Secondary | ICD-10-CM | POA: Diagnosis not present

## 2021-06-04 DIAGNOSIS — E119 Type 2 diabetes mellitus without complications: Secondary | ICD-10-CM | POA: Diagnosis not present

## 2021-06-04 DIAGNOSIS — B372 Candidiasis of skin and nail: Secondary | ICD-10-CM | POA: Diagnosis not present

## 2021-06-04 DIAGNOSIS — I10 Essential (primary) hypertension: Secondary | ICD-10-CM | POA: Diagnosis not present

## 2021-06-04 DIAGNOSIS — I4821 Permanent atrial fibrillation: Secondary | ICD-10-CM | POA: Diagnosis not present

## 2021-06-04 DIAGNOSIS — L89893 Pressure ulcer of other site, stage 3: Secondary | ICD-10-CM | POA: Diagnosis not present

## 2021-06-04 DIAGNOSIS — J45909 Unspecified asthma, uncomplicated: Secondary | ICD-10-CM | POA: Diagnosis not present

## 2021-06-05 DIAGNOSIS — J45909 Unspecified asthma, uncomplicated: Secondary | ICD-10-CM | POA: Diagnosis not present

## 2021-06-05 DIAGNOSIS — I10 Essential (primary) hypertension: Secondary | ICD-10-CM | POA: Diagnosis not present

## 2021-06-05 DIAGNOSIS — E119 Type 2 diabetes mellitus without complications: Secondary | ICD-10-CM | POA: Diagnosis not present

## 2021-06-05 DIAGNOSIS — L89893 Pressure ulcer of other site, stage 3: Secondary | ICD-10-CM | POA: Diagnosis not present

## 2021-06-05 DIAGNOSIS — I4821 Permanent atrial fibrillation: Secondary | ICD-10-CM | POA: Diagnosis not present

## 2021-06-05 DIAGNOSIS — B372 Candidiasis of skin and nail: Secondary | ICD-10-CM | POA: Diagnosis not present

## 2021-06-09 DIAGNOSIS — E118 Type 2 diabetes mellitus with unspecified complications: Secondary | ICD-10-CM | POA: Diagnosis not present

## 2021-06-09 DIAGNOSIS — E7800 Pure hypercholesterolemia, unspecified: Secondary | ICD-10-CM | POA: Diagnosis not present

## 2021-06-09 DIAGNOSIS — M109 Gout, unspecified: Secondary | ICD-10-CM | POA: Diagnosis not present

## 2021-06-10 DIAGNOSIS — E119 Type 2 diabetes mellitus without complications: Secondary | ICD-10-CM | POA: Diagnosis not present

## 2021-06-10 DIAGNOSIS — L89893 Pressure ulcer of other site, stage 3: Secondary | ICD-10-CM | POA: Diagnosis not present

## 2021-06-10 DIAGNOSIS — J45909 Unspecified asthma, uncomplicated: Secondary | ICD-10-CM | POA: Diagnosis not present

## 2021-06-10 DIAGNOSIS — B372 Candidiasis of skin and nail: Secondary | ICD-10-CM | POA: Diagnosis not present

## 2021-06-10 DIAGNOSIS — I4821 Permanent atrial fibrillation: Secondary | ICD-10-CM | POA: Diagnosis not present

## 2021-06-10 DIAGNOSIS — I10 Essential (primary) hypertension: Secondary | ICD-10-CM | POA: Diagnosis not present

## 2021-06-11 DIAGNOSIS — J45909 Unspecified asthma, uncomplicated: Secondary | ICD-10-CM | POA: Diagnosis not present

## 2021-06-11 DIAGNOSIS — E119 Type 2 diabetes mellitus without complications: Secondary | ICD-10-CM | POA: Diagnosis not present

## 2021-06-11 DIAGNOSIS — B372 Candidiasis of skin and nail: Secondary | ICD-10-CM | POA: Diagnosis not present

## 2021-06-11 DIAGNOSIS — I4821 Permanent atrial fibrillation: Secondary | ICD-10-CM | POA: Diagnosis not present

## 2021-06-11 DIAGNOSIS — I10 Essential (primary) hypertension: Secondary | ICD-10-CM | POA: Diagnosis not present

## 2021-06-11 DIAGNOSIS — L89893 Pressure ulcer of other site, stage 3: Secondary | ICD-10-CM | POA: Diagnosis not present

## 2021-06-12 DIAGNOSIS — I4821 Permanent atrial fibrillation: Secondary | ICD-10-CM | POA: Diagnosis not present

## 2021-06-12 DIAGNOSIS — J45909 Unspecified asthma, uncomplicated: Secondary | ICD-10-CM | POA: Diagnosis not present

## 2021-06-12 DIAGNOSIS — I10 Essential (primary) hypertension: Secondary | ICD-10-CM | POA: Diagnosis not present

## 2021-06-12 DIAGNOSIS — B372 Candidiasis of skin and nail: Secondary | ICD-10-CM | POA: Diagnosis not present

## 2021-06-12 DIAGNOSIS — L89893 Pressure ulcer of other site, stage 3: Secondary | ICD-10-CM | POA: Diagnosis not present

## 2021-06-12 DIAGNOSIS — E119 Type 2 diabetes mellitus without complications: Secondary | ICD-10-CM | POA: Diagnosis not present

## 2021-06-13 DIAGNOSIS — E785 Hyperlipidemia, unspecified: Secondary | ICD-10-CM | POA: Diagnosis not present

## 2021-06-13 DIAGNOSIS — M436 Torticollis: Secondary | ICD-10-CM | POA: Diagnosis not present

## 2021-06-13 DIAGNOSIS — Z7901 Long term (current) use of anticoagulants: Secondary | ICD-10-CM | POA: Diagnosis not present

## 2021-06-13 DIAGNOSIS — M47812 Spondylosis without myelopathy or radiculopathy, cervical region: Secondary | ICD-10-CM | POA: Diagnosis not present

## 2021-06-13 DIAGNOSIS — Z7984 Long term (current) use of oral hypoglycemic drugs: Secondary | ICD-10-CM | POA: Diagnosis not present

## 2021-06-13 DIAGNOSIS — J45909 Unspecified asthma, uncomplicated: Secondary | ICD-10-CM | POA: Diagnosis not present

## 2021-06-13 DIAGNOSIS — B372 Candidiasis of skin and nail: Secondary | ICD-10-CM | POA: Diagnosis not present

## 2021-06-13 DIAGNOSIS — Z8616 Personal history of COVID-19: Secondary | ICD-10-CM | POA: Diagnosis not present

## 2021-06-13 DIAGNOSIS — L89893 Pressure ulcer of other site, stage 3: Secondary | ICD-10-CM | POA: Diagnosis not present

## 2021-06-13 DIAGNOSIS — I4821 Permanent atrial fibrillation: Secondary | ICD-10-CM | POA: Diagnosis not present

## 2021-06-13 DIAGNOSIS — E119 Type 2 diabetes mellitus without complications: Secondary | ICD-10-CM | POA: Diagnosis not present

## 2021-06-13 DIAGNOSIS — I10 Essential (primary) hypertension: Secondary | ICD-10-CM | POA: Diagnosis not present

## 2021-06-13 DIAGNOSIS — Z87891 Personal history of nicotine dependence: Secondary | ICD-10-CM | POA: Diagnosis not present

## 2021-06-13 DIAGNOSIS — Z7951 Long term (current) use of inhaled steroids: Secondary | ICD-10-CM | POA: Diagnosis not present

## 2021-06-13 DIAGNOSIS — Z993 Dependence on wheelchair: Secondary | ICD-10-CM | POA: Diagnosis not present

## 2021-06-13 DIAGNOSIS — Z9981 Dependence on supplemental oxygen: Secondary | ICD-10-CM | POA: Diagnosis not present

## 2021-06-16 DIAGNOSIS — Z993 Dependence on wheelchair: Secondary | ICD-10-CM | POA: Diagnosis not present

## 2021-06-16 DIAGNOSIS — Z9981 Dependence on supplemental oxygen: Secondary | ICD-10-CM | POA: Diagnosis not present

## 2021-06-16 DIAGNOSIS — Z0000 Encounter for general adult medical examination without abnormal findings: Secondary | ICD-10-CM | POA: Diagnosis not present

## 2021-06-16 DIAGNOSIS — I509 Heart failure, unspecified: Secondary | ICD-10-CM | POA: Diagnosis not present

## 2021-06-16 DIAGNOSIS — J4540 Moderate persistent asthma, uncomplicated: Secondary | ICD-10-CM | POA: Diagnosis not present

## 2021-06-16 DIAGNOSIS — M47812 Spondylosis without myelopathy or radiculopathy, cervical region: Secondary | ICD-10-CM | POA: Diagnosis not present

## 2021-06-16 DIAGNOSIS — I4821 Permanent atrial fibrillation: Secondary | ICD-10-CM | POA: Diagnosis not present

## 2021-06-16 DIAGNOSIS — E118 Type 2 diabetes mellitus with unspecified complications: Secondary | ICD-10-CM | POA: Diagnosis not present

## 2021-06-16 DIAGNOSIS — M1990 Unspecified osteoarthritis, unspecified site: Secondary | ICD-10-CM | POA: Diagnosis not present

## 2021-06-16 DIAGNOSIS — I110 Hypertensive heart disease with heart failure: Secondary | ICD-10-CM | POA: Diagnosis not present

## 2021-06-16 DIAGNOSIS — E7800 Pure hypercholesterolemia, unspecified: Secondary | ICD-10-CM | POA: Diagnosis not present

## 2021-06-16 DIAGNOSIS — Z7901 Long term (current) use of anticoagulants: Secondary | ICD-10-CM | POA: Diagnosis not present

## 2021-06-24 ENCOUNTER — Inpatient Hospital Stay (HOSPITAL_COMMUNITY): Payer: Medicare Other

## 2021-06-24 ENCOUNTER — Inpatient Hospital Stay (HOSPITAL_COMMUNITY)
Admission: EM | Admit: 2021-06-24 | Discharge: 2021-06-28 | DRG: 689 | Disposition: A | Discharge status: Skilled Nursing Facility | Payer: Medicare Other | Attending: Internal Medicine | Admitting: Internal Medicine

## 2021-06-24 ENCOUNTER — Emergency Department (HOSPITAL_COMMUNITY): Payer: Medicare Other

## 2021-06-24 ENCOUNTER — Other Ambulatory Visit: Payer: Self-pay

## 2021-06-24 ENCOUNTER — Encounter (HOSPITAL_COMMUNITY): Payer: Self-pay

## 2021-06-24 DIAGNOSIS — L89893 Pressure ulcer of other site, stage 3: Secondary | ICD-10-CM | POA: Diagnosis present

## 2021-06-24 DIAGNOSIS — R531 Weakness: Secondary | ICD-10-CM

## 2021-06-24 DIAGNOSIS — E785 Hyperlipidemia, unspecified: Secondary | ICD-10-CM | POA: Diagnosis present

## 2021-06-24 DIAGNOSIS — N390 Urinary tract infection, site not specified: Secondary | ICD-10-CM | POA: Diagnosis present

## 2021-06-24 DIAGNOSIS — R000 Tachycardia, unspecified: Secondary | ICD-10-CM | POA: Diagnosis not present

## 2021-06-24 DIAGNOSIS — R410 Disorientation, unspecified: Secondary | ICD-10-CM | POA: Diagnosis not present

## 2021-06-24 DIAGNOSIS — R4182 Altered mental status, unspecified: Secondary | ICD-10-CM | POA: Diagnosis not present

## 2021-06-24 DIAGNOSIS — S61511A Laceration without foreign body of right wrist, initial encounter: Secondary | ICD-10-CM | POA: Diagnosis not present

## 2021-06-24 DIAGNOSIS — E119 Type 2 diabetes mellitus without complications: Secondary | ICD-10-CM | POA: Diagnosis present

## 2021-06-24 DIAGNOSIS — S61411A Laceration without foreign body of right hand, initial encounter: Secondary | ICD-10-CM | POA: Diagnosis present

## 2021-06-24 DIAGNOSIS — S0083XA Contusion of other part of head, initial encounter: Secondary | ICD-10-CM | POA: Diagnosis present

## 2021-06-24 DIAGNOSIS — I509 Heart failure, unspecified: Secondary | ICD-10-CM | POA: Diagnosis present

## 2021-06-24 DIAGNOSIS — M40202 Unspecified kyphosis, cervical region: Secondary | ICD-10-CM | POA: Diagnosis present

## 2021-06-24 DIAGNOSIS — Z803 Family history of malignant neoplasm of breast: Secondary | ICD-10-CM | POA: Diagnosis not present

## 2021-06-24 DIAGNOSIS — Z7401 Bed confinement status: Secondary | ICD-10-CM | POA: Diagnosis not present

## 2021-06-24 DIAGNOSIS — W19XXXA Unspecified fall, initial encounter: Secondary | ICD-10-CM | POA: Diagnosis present

## 2021-06-24 DIAGNOSIS — L89892 Pressure ulcer of other site, stage 2: Secondary | ICD-10-CM

## 2021-06-24 DIAGNOSIS — Z823 Family history of stroke: Secondary | ICD-10-CM

## 2021-06-24 DIAGNOSIS — Z9109 Other allergy status, other than to drugs and biological substances: Secondary | ICD-10-CM

## 2021-06-24 DIAGNOSIS — Z23 Encounter for immunization: Secondary | ICD-10-CM

## 2021-06-24 DIAGNOSIS — J45909 Unspecified asthma, uncomplicated: Secondary | ICD-10-CM | POA: Diagnosis present

## 2021-06-24 DIAGNOSIS — L8990 Pressure ulcer of unspecified site, unspecified stage: Secondary | ICD-10-CM | POA: Diagnosis present

## 2021-06-24 DIAGNOSIS — M2578 Osteophyte, vertebrae: Secondary | ICD-10-CM | POA: Diagnosis not present

## 2021-06-24 DIAGNOSIS — Z7901 Long term (current) use of anticoagulants: Secondary | ICD-10-CM

## 2021-06-24 DIAGNOSIS — I110 Hypertensive heart disease with heart failure: Secondary | ICD-10-CM | POA: Diagnosis present

## 2021-06-24 DIAGNOSIS — Z8249 Family history of ischemic heart disease and other diseases of the circulatory system: Secondary | ICD-10-CM

## 2021-06-24 DIAGNOSIS — Z9049 Acquired absence of other specified parts of digestive tract: Secondary | ICD-10-CM

## 2021-06-24 DIAGNOSIS — Z20822 Contact with and (suspected) exposure to covid-19: Secondary | ICD-10-CM | POA: Diagnosis present

## 2021-06-24 DIAGNOSIS — F0390 Unspecified dementia without behavioral disturbance: Secondary | ICD-10-CM | POA: Diagnosis present

## 2021-06-24 DIAGNOSIS — G9341 Metabolic encephalopathy: Secondary | ICD-10-CM | POA: Diagnosis present

## 2021-06-24 DIAGNOSIS — N3000 Acute cystitis without hematuria: Secondary | ICD-10-CM | POA: Diagnosis not present

## 2021-06-24 DIAGNOSIS — M25531 Pain in right wrist: Secondary | ICD-10-CM | POA: Diagnosis not present

## 2021-06-24 DIAGNOSIS — I4821 Permanent atrial fibrillation: Secondary | ICD-10-CM | POA: Diagnosis present

## 2021-06-24 DIAGNOSIS — Z833 Family history of diabetes mellitus: Secondary | ICD-10-CM

## 2021-06-24 DIAGNOSIS — S41111A Laceration without foreign body of right upper arm, initial encounter: Secondary | ICD-10-CM | POA: Diagnosis not present

## 2021-06-24 DIAGNOSIS — Z8261 Family history of arthritis: Secondary | ICD-10-CM | POA: Diagnosis not present

## 2021-06-24 DIAGNOSIS — I4891 Unspecified atrial fibrillation: Secondary | ICD-10-CM | POA: Diagnosis not present

## 2021-06-24 DIAGNOSIS — M7989 Other specified soft tissue disorders: Secondary | ICD-10-CM | POA: Diagnosis not present

## 2021-06-24 DIAGNOSIS — Z7984 Long term (current) use of oral hypoglycemic drugs: Secondary | ICD-10-CM

## 2021-06-24 DIAGNOSIS — Z743 Need for continuous supervision: Secondary | ICD-10-CM | POA: Diagnosis not present

## 2021-06-24 DIAGNOSIS — Z79899 Other long term (current) drug therapy: Secondary | ICD-10-CM

## 2021-06-24 DIAGNOSIS — Z66 Do not resuscitate: Secondary | ICD-10-CM | POA: Diagnosis present

## 2021-06-24 DIAGNOSIS — R791 Abnormal coagulation profile: Secondary | ICD-10-CM | POA: Diagnosis not present

## 2021-06-24 DIAGNOSIS — M25561 Pain in right knee: Secondary | ICD-10-CM | POA: Diagnosis not present

## 2021-06-24 DIAGNOSIS — R0902 Hypoxemia: Secondary | ICD-10-CM | POA: Diagnosis present

## 2021-06-24 DIAGNOSIS — R404 Transient alteration of awareness: Secondary | ICD-10-CM | POA: Diagnosis not present

## 2021-06-24 DIAGNOSIS — Z981 Arthrodesis status: Secondary | ICD-10-CM

## 2021-06-24 DIAGNOSIS — Z90710 Acquired absence of both cervix and uterus: Secondary | ICD-10-CM

## 2021-06-24 DIAGNOSIS — Z91018 Allergy to other foods: Secondary | ICD-10-CM

## 2021-06-24 DIAGNOSIS — Z885 Allergy status to narcotic agent status: Secondary | ICD-10-CM

## 2021-06-24 DIAGNOSIS — Y92009 Unspecified place in unspecified non-institutional (private) residence as the place of occurrence of the external cause: Secondary | ICD-10-CM | POA: Diagnosis not present

## 2021-06-24 DIAGNOSIS — W010XXA Fall on same level from slipping, tripping and stumbling without subsequent striking against object, initial encounter: Secondary | ICD-10-CM

## 2021-06-24 LAB — GLUCOSE, CAPILLARY: Glucose-Capillary: 127 mg/dL — ABNORMAL HIGH (ref 70–99)

## 2021-06-24 LAB — COMPREHENSIVE METABOLIC PANEL
ALT: 15 U/L (ref 0–44)
AST: 31 U/L (ref 15–41)
Albumin: 3.2 g/dL — ABNORMAL LOW (ref 3.5–5.0)
Alkaline Phosphatase: 47 U/L (ref 38–126)
Anion gap: 8 (ref 5–15)
BUN: 24 mg/dL — ABNORMAL HIGH (ref 8–23)
CO2: 28 mmol/L (ref 22–32)
Calcium: 7.8 mg/dL — ABNORMAL LOW (ref 8.9–10.3)
Chloride: 103 mmol/L (ref 98–111)
Creatinine, Ser: 0.67 mg/dL (ref 0.44–1.00)
GFR, Estimated: >60 mL/min (ref >60)
Glucose, Bld: 122 mg/dL — ABNORMAL HIGH (ref 70–99)
Potassium: 4.9 mmol/L (ref 3.5–5.1)
Sodium: 139 mmol/L (ref 135–145)
Total Bilirubin: 1.7 mg/dL — ABNORMAL HIGH (ref 0.3–1.2)
Total Protein: 6.5 g/dL (ref 6.5–8.1)

## 2021-06-24 LAB — URINALYSIS, ROUTINE W REFLEX MICROSCOPIC
Bilirubin Urine: NEGATIVE
Glucose, UA: NEGATIVE mg/dL
Ketones, ur: 5 mg/dL — AB
Nitrite: POSITIVE — AB
Protein, ur: 100 mg/dL — AB
RBC / HPF: >50 RBC/hpf — ABNORMAL HIGH (ref 0–5)
Specific Gravity, Urine: 1.032 — ABNORMAL HIGH (ref 1.005–1.030)
WBC, UA: >50 WBC/hpf — ABNORMAL HIGH (ref 0–5)
pH: 5 (ref 5.0–8.0)

## 2021-06-24 LAB — CBC
HCT: 32.2 % — ABNORMAL LOW (ref 36.0–46.0)
Hemoglobin: 10 g/dL — ABNORMAL LOW (ref 12.0–15.0)
MCH: 27.2 pg (ref 26.0–34.0)
MCHC: 31.1 g/dL (ref 30.0–36.0)
MCV: 87.5 fL (ref 80.0–100.0)
Platelets: 217 10*3/uL (ref 150–400)
RBC: 3.68 MIL/uL — ABNORMAL LOW (ref 3.87–5.11)
RDW: 18.4 % — ABNORMAL HIGH (ref 11.5–15.5)
WBC: 10.2 10*3/uL (ref 4.0–10.5)
nRBC: 0 % (ref 0.0–0.2)

## 2021-06-24 LAB — RESP PANEL BY RT-PCR (FLU A&B, COVID) ARPGX2
Influenza A by PCR: NEGATIVE
Influenza B by PCR: NEGATIVE
SARS Coronavirus 2 by RT PCR: NEGATIVE

## 2021-06-24 LAB — PROTIME-INR
INR: 1.6 — ABNORMAL HIGH (ref 0.8–1.2)
Prothrombin Time: 19.4 seconds — ABNORMAL HIGH (ref 11.4–15.2)

## 2021-06-24 MED ORDER — WARFARIN SODIUM 5 MG PO TABS
5.0000 mg | ORAL_TABLET | Freq: Once | ORAL | Status: AC
  Administered 2021-06-24: 21:00:00 5 mg via ORAL
  Filled 2021-06-24: qty 1

## 2021-06-24 MED ORDER — BACITRACIN ZINC 500 UNIT/GM EX OINT
TOPICAL_OINTMENT | Freq: Two times a day (BID) | CUTANEOUS | Status: DC
  Administered 2021-06-24: 1 via TOPICAL
  Filled 2021-06-24: qty 28.35
  Filled 2021-06-24: qty 0.9
  Filled 2021-06-24 (×2): qty 28.35

## 2021-06-24 MED ORDER — SODIUM CHLORIDE 0.9 % IV SOLN
1.0000 g | INTRAVENOUS | Status: DC
  Administered 2021-06-25 – 2021-06-27 (×3): 1 g via INTRAVENOUS
  Filled 2021-06-24 (×4): qty 10

## 2021-06-24 MED ORDER — SODIUM CHLORIDE 0.9 % IV BOLUS
1000.0000 mL | Freq: Once | INTRAVENOUS | Status: AC
  Administered 2021-06-24: 19:00:00 1000 mL via INTRAVENOUS

## 2021-06-24 MED ORDER — TETANUS-DIPHTH-ACELL PERTUSSIS 5-2.5-18.5 LF-MCG/0.5 IM SUSY
0.5000 mL | PREFILLED_SYRINGE | Freq: Once | INTRAMUSCULAR | Status: AC
  Administered 2021-06-24: 16:00:00 0.5 mL via INTRAMUSCULAR
  Filled 2021-06-24: qty 0.5

## 2021-06-24 MED ORDER — DILTIAZEM HCL 25 MG/5ML IV SOLN
20.0000 mg | Freq: Once | INTRAVENOUS | Status: AC
  Administered 2021-06-24: 19:00:00 20 mg via INTRAVENOUS
  Filled 2021-06-24: qty 5

## 2021-06-24 MED ORDER — SODIUM CHLORIDE 0.9 % IV SOLN
1.0000 g | Freq: Once | INTRAVENOUS | Status: AC
  Administered 2021-06-24: 18:00:00 1 g via INTRAVENOUS
  Filled 2021-06-24: qty 10

## 2021-06-24 MED ORDER — WARFARIN - PHARMACIST DOSING INPATIENT: Freq: Every day | Status: DC

## 2021-06-24 NOTE — H&P (Signed)
History and Physical    Casey Patel WUX:[id number] DOB: Sep 08, 1929 DOA: 06/24/2021  PCP: [name], MD  Patient coming from: HOME Sister at bedside I have personally briefly reviewed patient's old medical records in [place]  Chief Complaint: Fall  HPI: Casey Patel is a 87 y.o. female with medical history significant for asthma, CHF, chronic hypoxemia on 2 L, permanent atrial fibrillation on Coumadin, hypertension, type 2 diabetes, and hyperlipidemia who presents following a fall.  Patient lives at home with her sister.  Apparently around 2 AM this morning she had a fall.  Sister found her on the ground away from her walker in her bedroom and had to get help from her grandson who also lives with them.  Patient has no recollection of how she fell.  She was noted to have significant skin tear to her right hand. EMS was called and wrapped her wounds but pt did not want to come to ER. However later in the day she not able to get out of bed, more tired, not able to help with transport so sister brought her in to ER.   ED Course: She was afebrile, in atrial fibrillation with RVR with rates up to 110-120, normotensive on home 2 L via nasal cannula WBC of 10 point 2K, hemoglobin of 10 around her baseline.  Sodium 139, K of 4.9, creatinine of 0.67, BG of 122  CT head, C-spine was negative.  Right hand, wrist, and knee were negative for fracture.  UA was positive for nitrite, large leukocyte, 100 protein and many bacteria. She was started on IV Rocephin and hospitalist called for admission  Review of Systems: Pertinent negatives and positives above as patient was a limited historian  Social Lives with sister and grandson. Sister takes care of all her ADLs. Only able to ambulate with walker by herself.   Past Medical History:  Diagnosis Date   AF (atrial fibrillation) ([facility])    Arthritis    Asthma    Diabetes mellitus    TYPE 2   Hyperlipidemia    Hypertension    PNA  (pneumonia)     Past Surgical History:  Procedure Laterality Date   ABDOMINAL HYSTERECTOMY  1976   BACK SURGERY     lumb x 3   CARDIOVERSION  12/07/2011   Procedure: CARDIOVERSION;  Surgeon: Peter M Martinique, MD;  Location: [place];  Service: Cardiovascular;  Laterality: N/A;   CARPAL TUNNEL RELEASE  09/15/2011   Procedure: CARPAL TUNNEL RELEASE;  Surgeon: Wynonia Sours, MD;  Location: [place];  Service: Orthopedics;  Laterality: Right;   CATARACT EXTRACTION     CHOLECYSTECTOMY  2003   HAND TENDON SURGERY  1999   left wrist - thumb   LAMINECTOMY  1999   with fusion (L1-5)   LAMINECTOMY  19995   with fusion (L3-4)   TONSILLECTOMY  1952      Allergies  Allergen Reactions   Wheat Bran Shortness Of Breath   Kiwi Extract Other (See Comments)    Causes mouth sores   Orange Fruit [Citrus] Other (See Comments)    Causes mouth sores   Phenergan [Promethazine]     Other reaction(s): disoriented   Pineapple Other (See Comments)    Causes mouth sores    Promethazine Hcl Other (See Comments)    Disorientation    Tomato Other (See Comments)    Causes mouth sores   Tylenol [Acetaminophen] Other (See Comments)    Causes fast heart  rate   Vistaril [Hydroxyzine Hcl] Other (See Comments)    confusion   Codeine Itching and Rash   Tape Other (See Comments) and Rash    Blisters on skin    Family History  Problem Relation Age of Onset   Hypertension Mother 4   Stroke Mother 30   Arthritis Mother 16   Heart attack Father 67   Heart disease Father 45   Hypertension Father 95   Ulcers Father 62   Breast cancer Sister    Heart disease Brother        CONGENTIAL HEART DISEASE   Heart disease Sister    Diabetes Sister    Breast cancer Sister      Prior to Admission medications   Medication Sig Start Date End Date Taking? Authorizing Provider  albuterol (VENTOLIN HFA) 108 (90 Base) MCG/ACT inhaler Inhale 2 puffs into the lungs every 6 (six) hours as needed for  wheezing or shortness of breath.   Yes [provider]  Ascorbic Acid (VITAMIN C) 1000 MG tablet Take 1,000 mg by mouth daily.   Yes [provider]  Cholecalciferol (VITAMIN D) 2000 UNITS CAPS Take 2,000 Units by mouth daily.   Yes [provider]  docusate sodium (COLACE) 100 MG capsule Take 1 capsule (100 mg total) by mouth daily as needed for mild constipation. Patient taking differently: Take 100 mg by mouth daily. 03/11/18  Yes Patrecia Pour, MD  fluticasone furoate-vilanterol (BREO ELLIPTA) 100-25 MCG/INH AEPB Inhale 1 puff into the lungs at bedtime.   Yes [provider]  loratadine (CLARITIN) 10 MG tablet Take 10 mg by mouth daily.   Yes [provider]  metFORMIN (GLUCOPHAGE) 500 MG tablet Take 500 mg by mouth 2 (two) times daily with a meal.  02/06/14  Yes [provider]  methocarbamol (ROBAXIN) 750 MG tablet Take 750 mg by mouth 3 (three) times daily.    Yes [provider]  metoprolol (TOPROL-XL) 100 MG 24 hr tablet Take 150 mg by mouth at bedtime.   Yes [provider]  Nutritional Supplements (GLUCERNA ADVANCE SHAKE) LIQD Take 1 Bottle by mouth daily as needed (nutritional suppliment). 08/08/19  Yes Martinique, Peter M, MD  omeprazole (PRILOSEC) 20 MG capsule Take 20 mg by mouth daily.   Yes [provider]  traMADol (ULTRAM) 50 MG tablet Take 1 tablet (50 mg total) by mouth every 6 (six) hours as needed (pain). Patient taking differently: Take 50 mg by mouth every 6 (six) hours as needed for moderate pain (pain). 03/11/18  Yes Patrecia Pour, MD  warfarin (COUMADIN) 5 MG tablet Take 2.5 mg by mouth See admin instructions. Take 2.5 tablet daily except Monday and Friday   Yes [provider]    Physical Exam: Vitals:   06/24/21 1913 06/24/21 1915 06/24/21 1918 06/24/21 1920  BP:  100/79 92/75 108/66  Pulse: 78  92 93  Resp: 16 15 (!) 24 15  Temp:      TempSrc:      SpO2: 97%  99% 98%  Weight:       Height:        Constitutional: NAD, calm, comfortable, nontoxic appearing elderly female laying in bed with severe kyphosis of the cervical spine with head held in complete flexion Vitals:   06/24/21 1913 06/24/21 1915 06/24/21 1918 06/24/21 1920  BP:  100/79 92/75 108/66  Pulse: 78  92 93  Resp: 16 15 (!) 24 15  Temp:  TempSrc:      SpO2: 97%  99% 98%  Weight:      Height:       Eyes: PERRL, lids and conjunctivae normal ENMT: Mucous membranes are moist. Posterior pharynx clear of any exudate or lesions.Normal dentition.  Neck: normal, supple, no masses, no thyromegaly Respiratory: clear to auscultation bilaterally, no wheezing, no crackles. Normal respiratory effort. No accessory muscle use.  Cardiovascular: Regular rate and rhythm, no murmurs / rubs / gallops. No extremity edema. 2+ pedal pulses. No carotid bruits.  Abdomen: no tenderness, no masses palpated. No hepatosplenomegaly. Bowel sounds positive.  Musculoskeletal: no clubbing / cyanosis.  Severe arthritic changes with deformities on bilateral hand. bruising to first, second and third digit of the right hand Skin tear dorsal right hand wrapped in clean ace bandage .  Skin: Stage 2 Ulcer on anterior chest with clean bandage. Neurologic: CN 2-12 grossly intact. Strength 4/5 in lower extremity.  Psychiatric: Normal judgment and insight. Alert and oriented x 3. Normal mood.      Labs on Admission: I have personally reviewed following labs and imaging studies  CBC: Recent Labs  Lab 06/24/21 1510  WBC 10.2  HGB 10.0*  HCT 32.2*  MCV 87.5  PLT 801   Basic Metabolic Panel: Recent Labs  Lab 06/24/21 1510  NA 139  K 4.9  CL 103  CO2 28  GLUCOSE 122*  BUN 24*  CREATININE 0.67  CALCIUM 7.8*   GFR: Estimated Creatinine Clearance: 48.9 mL/min (by C-G formula based on SCr of 0.67 mg/dL). Liver Function Tests: Recent Labs  Lab 06/24/21 1510  AST 31  ALT 15  ALKPHOS 47  BILITOT 1.7*  PROT 6.5  ALBUMIN  3.2*   No results for input(s): LIPASE, AMYLASE in the last 168 hours. No results for input(s): AMMONIA in the last 168 hours. Coagulation Profile: Recent Labs  Lab 06/24/21 1510  INR 1.6*   Cardiac Enzymes: No results for input(s): CKTOTAL, CKMB, CKMBINDEX, TROPONINI in the last 168 hours. BNP (last 3 results) No results for input(s): PROBNP in the last 8760 hours. HbA1C: No results for input(s): HGBA1C in the last 72 hours. CBG: No results for input(s): GLUCAP in the last 168 hours. Lipid Profile: No results for input(s): CHOL, HDL, LDLCALC, TRIG, CHOLHDL, LDLDIRECT in the last 72 hours. Thyroid Function Tests: No results for input(s): TSH, T4TOTAL, FREET4, T3FREE, THYROIDAB in the last 72 hours. Anemia Panel: No results for input(s): VITAMINB12, FOLATE, FERRITIN, TIBC, IRON, RETICCTPCT in the last 72 hours. Urine analysis:    Component Value Date/Time   COLORURINE AMBER (A) 06/24/2021 1530   APPEARANCEUR CLOUDY (A) 06/24/2021 1530   LABSPEC 1.032 (H) 06/24/2021 1530   PHURINE 5.0 06/24/2021 1530   GLUCOSEU NEGATIVE 06/24/2021 1530   HGBUR SMALL (A) 06/24/2021 1530   BILIRUBINUR NEGATIVE 06/24/2021 1530   KETONESUR 5 (A) 06/24/2021 1530   PROTEINUR 100 (A) 06/24/2021 1530   NITRITE POSITIVE (A) 06/24/2021 1530   LEUKOCYTESUR LARGE (A) 06/24/2021 1530    Radiological Exams on Admission: DG Wrist Complete Right  Result Date: 06/24/2021 CLINICAL DATA:  Right wrist pain after fall yesterday. EXAM: RIGHT WRIST - COMPLETE 3+ VIEW COMPARISON:  March 09, 2018. FINDINGS: There is no evidence of fracture or dislocation. Severe degenerative changes seen involving the radiocarpal joint as well as the first metacarpophalangeal joint. Soft tissues are unremarkable. IMPRESSION: Severe degenerative changes as described above. No acute abnormality seen. Electronically Signed   By: Bobbe Medico.D.  On: 06/24/2021 16:14   CT HEAD WO CONTRAST (5MM)  Result Date:  06/24/2021 CLINICAL DATA:  Head trauma, minor. Increased falls with weakness and confusion. EXAM: CT HEAD WITHOUT CONTRAST CT CERVICAL SPINE WITHOUT CONTRAST TECHNIQUE: Multidetector CT imaging of the head and cervical spine was performed following the standard protocol without intravenous contrast. Multiplanar CT image reconstructions of the cervical spine were also generated. COMPARISON:  Prior CTs 07/16/2011 FINDINGS: CT HEAD FINDINGS Due to extreme cervicothoracic kyphosis, the images were acquired in a direct nearly coronal plane. Plane of imaging affects image quality. Brain: There is no evidence of acute intracranial hemorrhage, mass lesion, brain edema or extra-axial fluid collection. The ventricles and subarachnoid spaces are appropriately sized for age. Low-density in the periventricular white matter appear similar to previous study, likely due to chronic small vessel ischemic changes. No evidence of acute cortical based infarct. Vascular: Prominent intracranial vascular calcifications. No hyperdense vessel identified. Skull: Negative for fracture or focal lesion. Calvarial hyperostosis noted. Sinuses/Orbits: The visualized paranasal sinuses and mastoid air cells are clear. No orbital abnormalities are seen. Other: Advanced TMJ degenerative changes bilaterally. CT CERVICAL SPINE FINDINGS Alignment: Exaggerated cervicothoracic kyphosis without evidence of focal angulation or progressive listhesis. Skull base and vertebrae: No evidence of acute cervical spine fracture or traumatic subluxation. There is multilevel spondylosis. The right C3-4 facet joint appears ankylosed. As above, bilateral TMJ osteoarthritis. Soft tissues and spinal canal: No prevertebral fluid or swelling. No visible canal hematoma. Disc levels: Chronic multilevel spondylosis with endplate osteophytes and facet hypertrophy contributing to mild foraminal narrowing at multiple levels. No large disc herniation identified. Upper chest:  Emphysematous changes at the lung apices. Aortic and great vessel atherosclerosis. Other: None. IMPRESSION: 1. No acute intracranial or calvarial findings. 2. Chronic small vessel ischemic changes in the periventricular white matter. 3. No evidence of acute cervical spine fracture, traumatic subluxation or static signs of instability. 4. Chronic cervicothoracic kyphosis and multilevel spondylosis. Electronically Signed   By: Richardean Sale M.D.   On: 06/24/2021 16:17   CT CERVICAL SPINE WO CONTRAST  Result Date: 06/24/2021 CLINICAL DATA:  Head trauma, minor. Increased falls with weakness and confusion. EXAM: CT HEAD WITHOUT CONTRAST CT CERVICAL SPINE WITHOUT CONTRAST TECHNIQUE: Multidetector CT imaging of the head and cervical spine was performed following the standard protocol without intravenous contrast. Multiplanar CT image reconstructions of the cervical spine were also generated. COMPARISON:  Prior CTs 07/16/2011 FINDINGS: CT HEAD FINDINGS Due to extreme cervicothoracic kyphosis, the images were acquired in a direct nearly coronal plane. Plane of imaging affects image quality. Brain: There is no evidence of acute intracranial hemorrhage, mass lesion, brain edema or extra-axial fluid collection. The ventricles and subarachnoid spaces are appropriately sized for age. Low-density in the periventricular white matter appear similar to previous study, likely due to chronic small vessel ischemic changes. No evidence of acute cortical based infarct. Vascular: Prominent intracranial vascular calcifications. No hyperdense vessel identified. Skull: Negative for fracture or focal lesion. Calvarial hyperostosis noted. Sinuses/Orbits: The visualized paranasal sinuses and mastoid air cells are clear. No orbital abnormalities are seen. Other: Advanced TMJ degenerative changes bilaterally. CT CERVICAL SPINE FINDINGS Alignment: Exaggerated cervicothoracic kyphosis without evidence of focal angulation or progressive  listhesis. Skull base and vertebrae: No evidence of acute cervical spine fracture or traumatic subluxation. There is multilevel spondylosis. The right C3-4 facet joint appears ankylosed. As above, bilateral TMJ osteoarthritis. Soft tissues and spinal canal: No prevertebral fluid or swelling. No visible canal hematoma. Disc levels: Chronic multilevel spondylosis with  endplate osteophytes and facet hypertrophy contributing to mild foraminal narrowing at multiple levels. No large disc herniation identified. Upper chest: Emphysematous changes at the lung apices. Aortic and great vessel atherosclerosis. Other: None. IMPRESSION: 1. No acute intracranial or calvarial findings. 2. Chronic small vessel ischemic changes in the periventricular white matter. 3. No evidence of acute cervical spine fracture, traumatic subluxation or static signs of instability. 4. Chronic cervicothoracic kyphosis and multilevel spondylosis. Electronically Signed   By: Richardean Sale M.D.   On: 06/24/2021 16:17   DG Knee Complete 4 Views Right  Result Date: 06/24/2021 CLINICAL DATA:  Right knee pain after fall yesterday. EXAM: RIGHT KNEE - COMPLETE 4+ VIEW COMPARISON:  None. FINDINGS: No evidence of fracture, dislocation, or joint effusion. Moderate narrowing of medial joint space is noted. Chondrocalcinosis is noted laterally with minimal osteophyte formation. Soft tissues are unremarkable. IMPRESSION: Moderate degenerative joint disease.  No acute abnormality seen. Electronically Signed   By: Marijo Conception M.D.   On: 06/24/2021 16:16      Assessment/Plan  Acute metabolic encephalopathy  Secondary to UTI and likely underlying dementia Continue IV Rocephin  UTI -continue IV Rocephin pending urine culture  Atrial fibrillation with RVR/Subtherapeutic INR Controlled following 20 mg IV diltiazem in ED Continue home metoprolol INR of 1.6- warfarin per pharmacy   Chronic hypoxia w/hx of asthma/CHF  Stable at baseline on 2L    right UE skin abrasion s/p fall  right hand, wrist X-ray negative Daily wound care   Pressure ulcer of the anterior chest Patient's head is held in complete flexion due to severe kyphosis of the C-spine with resulting pressure ulcer on her anterior chest -Daily wound care  Severe kyphosis of the cervical spine Obtain speech eval  DVT prophylaxis:warfarin Code Status: Full Family Communication: Plan discussed with sister at bedside  disposition Plan: Home with at least 2 midnight stays  Consults called:  Admission status: inpatient     Level of care: Telemetry  Status is: Inpatient  Remains inpatient appropriate because: Admit - It is my clinical opinion that admission to INPATIENT is reasonable and necessary because this patient will require at least 2 midnights in the hospital to treat this condition based on the medical complexity of the problems presented.  Given the aforementioned information, the predictability of an adverse outcome is felt to be significant.         Orene Desanctis DO [facility]   If 7PM-7AM, please contact night-coverage [email or web address]   06/24/2021, 7:37 PM

## 2021-06-24 NOTE — ED Triage Notes (Signed)
Pt bib ems for increased falls, increased weakness and confusion.  Pt from home w/ sister and grandson as care givers. Pt hx fall Monday 2am, refused transport at that time, pt has skin tear to right hand/forearm from fall Monday.  Pt incontinence of urine.

## 2021-06-24 NOTE — ED Notes (Signed)
Pt's right wrist skin tear cleaned, bacitracin applied, non-adherent dressing, 4x4's and rolled gauze applied by ed RN.  Pt tolerated well.

## 2021-06-24 NOTE — ED Provider Notes (Addendum)
[facility] DEPT Provider Note   CSN: [id number] Arrival date & time: 06/24/21  1353     History Chief Complaint  Patient presents with   Fall   Altered Mental Status    Casey Patel is a 87 y.o. female.  Patient s/p fall two days ago at/around 2 AM in bedroom. No loc noted, but not clear from history what caused fall. Hit head, small bruise to face. Also with skin tear and pain to dorsum right wrist. Last tetanus unknown. Also w contusion to anterior right knee. At baseline, limited ambulation w walker/assistance - in past couple days, patient not able to ambulate. Family also notes some general confusion/general weakness. Is on chronic anticoag therapy due to hx afib. No recent abnormal bleeding. No headache. No neck or back pain. No radicular pain. No chest pain or discomfort. No sob. No cough or uri symptoms. No abd pain or nvd. No dysuria or gu c/o. No fever or chills. No faintness or dizziness prior to fall. No melena or rectal bleeding.   The history is provided by the patient, medical records, a relative and the EMS personnel.  Fall Pertinent negatives include no chest pain, no abdominal pain, no headaches and no shortness of breath.  Altered Mental Status Associated symptoms: no abdominal pain, no agitation, no fever, no headaches, no palpitations and no vomiting       Past Medical History:  Diagnosis Date   AF (atrial fibrillation) (HCC)    Arthritis    Asthma    Diabetes mellitus    TYPE 2   Hyperlipidemia    Hypertension    PNA (pneumonia)     Patient Active Problem List   Diagnosis Date Noted   Pressure injury of skin 03/08/2018   Atrial fibrillation ([place]) 03/07/2018   Cough 01/01/2012   Pneumonia 12/03/2011   Arthritis    Hypertension    Hyperlipidemia    Asthma    DM (diabetes mellitus) ([place])     Past Surgical History:  Procedure Laterality Date   ABDOMINAL HYSTERECTOMY  1976   BACK SURGERY     lumb x 3    CARDIOVERSION  12/07/2011   Procedure: CARDIOVERSION;  Surgeon: Peter M Martinique, MD;  Location: [facility];  Service: Cardiovascular;  Laterality: N/A;   CARPAL TUNNEL RELEASE  09/15/2011   Procedure: CARPAL TUNNEL RELEASE;  Surgeon: Wynonia Sours, MD;  Location: [facility];  Service: Orthopedics;  Laterality: Right;   CATARACT EXTRACTION     CHOLECYSTECTOMY  2003   HAND TENDON SURGERY  1999   left wrist - thumb   LAMINECTOMY  1999   with fusion (L1-5)   LAMINECTOMY  19995   with fusion (L3-4)   TONSILLECTOMY  1952     OB History   No obstetric history on file.     Family History  Problem Relation Age of Onset   Hypertension Mother 54   Stroke Mother 45   Arthritis Mother 31   Heart attack Father 23   Heart disease Father 52   Hypertension Father 63   Ulcers Father 31   Breast cancer Sister    Heart disease Brother        CONGENTIAL HEART DISEASE   Heart disease Sister    Diabetes Sister    Breast cancer Sister     Social History   Tobacco Use   Smoking status: Former    Packs/day: 0.80    Years: 20.00  Pack years: 16.00    Types: Cigarettes    Quit date: 06/29/1978    Years since quitting: 43.0   Smokeless tobacco: Never  Substance Use Topics   Alcohol use: No   Drug use: No    Home Medications Prior to Admission medications   Medication Sig Start Date End Date Taking? Authorizing Provider  Cholecalciferol (VITAMIN D) 2000 UNITS CAPS Take 2,000 Units by mouth daily.    [provider]  docusate sodium (COLACE) 100 MG capsule Take 1 capsule (100 mg total) by mouth daily as needed for mild constipation. 03/11/18   Patrecia Pour, MD  fluticasone furoate-vilanterol (BREO ELLIPTA) 100-25 MCG/INH AEPB Inhale 1 puff into the lungs at bedtime.    [provider]  metFORMIN (GLUCOPHAGE) 500 MG tablet Take 500 mg by mouth 2 (two) times daily with a meal.  02/06/14   [provider]  methocarbamol (ROBAXIN) 750 MG tablet Take 750 mg by  mouth 3 (three) times daily.     [provider]  metoprolol (TOPROL-XL) 100 MG 24 hr tablet Take 150 mg by mouth at bedtime.    [provider]  Nutritional Supplements (GLUCERNA ADVANCE SHAKE) LIQD Take 1 shake twice a week 08/08/19   Martinique, Peter M, MD  omeprazole (PRILOSEC) 20 MG capsule Take 20 mg by mouth daily.    [provider]  traMADol (ULTRAM) 50 MG tablet Take 1 tablet (50 mg total) by mouth every 6 (six) hours as needed (pain). 03/11/18   Patrecia Pour, MD  warfarin (COUMADIN) 5 MG tablet Take 2.5-5 mg by mouth See admin instructions. Take 1 tablet (5 mg) by mouth on Sunday and Thursday at 5pm, take 1/2 tablet (2.5 mg) on Monday, Tuesday, Wednesday, Friday, Saturday at [employer], Historical, MD    Allergies    Wheat bran, Adhesive [tape], Kiwi extract, Orange fruit [citrus], Pineapple, Promethazine hcl, Tomato, Tylenol [acetaminophen], Vistaril [hydroxyzine hcl], and Codeine  Review of Systems   Review of Systems  Constitutional:  Negative for fever.  HENT:  Negative for nosebleeds.   Eyes:  Negative for redness and visual disturbance.  Respiratory:  Negative for cough and shortness of breath.   Cardiovascular:  Negative for chest pain, palpitations and leg swelling.  Gastrointestinal:  Negative for abdominal pain, blood in stool, diarrhea and vomiting.  Genitourinary:  Negative for dysuria and flank pain.  Musculoskeletal:  Negative for back pain and neck pain.  Skin:  Positive for wound.  Neurological:  Negative for speech difficulty, numbness and headaches.  Hematological:        On anticoag therapy  Psychiatric/Behavioral:  Negative for agitation.    Physical Exam Updated Vital Signs BP 116/86    Pulse (!) 118    Temp 99.3 F (37.4 C) (Rectal)    Resp 19    Ht 1.676 m (5\' 6" )    Wt 73.5 kg    SpO2 94%    BMI 26.15 kg/m   Physical Exam Vitals and nursing note reviewed.  Constitutional:      Appearance: Normal appearance. She is  well-developed.  HENT:     Head:     Comments: Small bruise to face. Facial bones and orbits appear grossly intact. No nasal septal hematoma. No malocclusion.     Nose: Nose normal.     Mouth/Throat:     Mouth: Mucous membranes are moist.  Eyes:     General: No scleral icterus.    Conjunctiva/sclera: Conjunctivae normal.  Pupils: Pupils are equal, round, and reactive to light.  Neck:     Vascular: No carotid bruit.     Trachea: No tracheal deviation.     Comments: Mild mid cervical tenderness.  Cardiovascular:     Rate and Rhythm: Tachycardia present. Rhythm irregular.     Pulses: Normal pulses.     Heart sounds: Normal heart sounds. No murmur heard.   No friction rub. No gallop.  Pulmonary:     Effort: Pulmonary effort is normal. No respiratory distress.     Breath sounds: Normal breath sounds.  Chest:     Chest wall: No tenderness.  Abdominal:     General: Bowel sounds are normal. There is no distension.     Palpations: Abdomen is soft.     Tenderness: There is no abdominal tenderness. There is no guarding.  Genitourinary:    Comments: No cva tenderness.  Musculoskeletal:        General: No swelling.     Cervical back: Normal range of motion and neck supple. No rigidity. No muscular tenderness.     Comments: Mild mid cervical tenderness, otherwise, CTLS spine, non tender, aligned, no step off. Large skin tear to dorsum right wrist with tenderness to wrist - no focal scaphoid tenderness. Tenderness and small bruise to anterior right knee. Right knee grossly stable without effusion. Otherwise good rom bilateral extremities without pain or focal bony tenderness. Distal pulses palp.   Skin:    General: Skin is warm and dry.     Findings: No rash.  Neurological:     Mental Status: She is alert.     Comments: Alert, speech normal. Mental status currently at baseline, although somewhat slow to respond/mildly lethargic appearing. Pt responds to questions appropriately.  Motor/sens grossly intact bil.   Psychiatric:        Mood and Affect: Mood normal.    ED Results / Procedures / Treatments   Labs (all labs ordered are listed, but only abnormal results are displayed) Results for orders placed or performed during the hospital encounter of 06/24/21  Resp Panel by RT-PCR (Flu A&B, Covid) Nasopharyngeal Swab   Specimen: Nasopharyngeal Swab; Nasopharyngeal(NP) swabs in vial transport medium  Result Value Ref Range   SARS Coronavirus 2 by RT PCR NEGATIVE NEGATIVE   Influenza A by PCR NEGATIVE NEGATIVE   Influenza B by PCR NEGATIVE NEGATIVE  CBC  Result Value Ref Range   WBC 10.2 4.0 - 10.5 K/uL   RBC 3.68 (L) 3.87 - 5.11 MIL/uL   Hemoglobin 10.0 (L) 12.0 - 15.0 g/dL   HCT 32.2 (L) 36.0 - 46.0 %   MCV 87.5 80.0 - 100.0 fL   MCH 27.2 26.0 - 34.0 pg   MCHC 31.1 30.0 - 36.0 g/dL   RDW 18.4 (H) 11.5 - 15.5 %   Platelets 217 150 - 400 K/uL   nRBC 0.0 0.0 - 0.2 %  Comprehensive metabolic panel  Result Value Ref Range   Sodium 139 135 - 145 mmol/L   Potassium 4.9 3.5 - 5.1 mmol/L   Chloride 103 98 - 111 mmol/L   CO2 28 22 - 32 mmol/L   Glucose, Bld 122 (H) 70 - 99 mg/dL   BUN 24 (H) 8 - 23 mg/dL   Creatinine, Ser 0.67 0.44 - 1.00 mg/dL   Calcium 7.8 (L) 8.9 - 10.3 mg/dL   Total Protein 6.5 6.5 - 8.1 g/dL   Albumin 3.2 (L) 3.5 - 5.0 g/dL   AST 31 15 -  41 U/L   ALT 15 0 - 44 U/L   Alkaline Phosphatase 47 38 - 126 U/L   Total Bilirubin 1.7 (H) 0.3 - 1.2 mg/dL   GFR, Estimated >60 >60 mL/min   Anion gap 8 5 - 15  Urinalysis, Routine w reflex microscopic Urine, In & Out Cath  Result Value Ref Range   Color, Urine AMBER (A) YELLOW   APPearance CLOUDY (A) CLEAR   Specific Gravity, Urine 1.032 (H) 1.005 - 1.030   pH 5.0 5.0 - 8.0   Glucose, UA NEGATIVE NEGATIVE mg/dL   Hgb urine dipstick SMALL (A) NEGATIVE   Bilirubin Urine NEGATIVE NEGATIVE   Ketones, ur 5 (A) NEGATIVE mg/dL   Protein, ur 100 (A) NEGATIVE mg/dL   Nitrite POSITIVE (A) NEGATIVE    Leukocytes,Ua LARGE (A) NEGATIVE   RBC / HPF >50 (H) 0 - 5 RBC/hpf   WBC, UA >50 (H) 0 - 5 WBC/hpf   Bacteria, UA MANY (A) NONE SEEN   Squamous Epithelial / LPF 0-5 0 - 5   WBC Clumps PRESENT    Mucus PRESENT   Protime-INR  Result Value Ref Range   Prothrombin Time 19.4 (H) 11.4 - 15.2 seconds   INR 1.6 (H) 0.8 - 1.2      EKG None  Radiology No results found.  Procedures Procedures   Medications Ordered in ED Medications  Tdap (BOOSTRIX) injection 0.5 mL (has no administration in time range)  bacitracin ointment (has no administration in time range)    ED Course  I have reviewed the triage vital signs and the nursing notes.  Pertinent labs & imaging results that were available during my care of the patient were reviewed by me and considered in my medical decision making (see chart for details).    MDM Rules/Calculators/A&P                         Labs sent and imaging ordered.  Reviewed nursing notes and prior charts for additional history.   Labs reviewed/interpreted by me  - wbc normal. Hct 32.   CT reviewed/interpreted by me - no hem.  Xrays reviewed/interpreted by me - no fx  Additional labs reviewed/interpreted by me - UA positive for uti. Will culture and tx.   Rocephin iv. Ivf.   Pt is too weak to walk/ambulate - in additional family notes general weakness, altered ms, decreased po - given above + uti, will admit.   Medicine consulted for admission.         Final Clinical Impression(s) / ED Diagnoses Final diagnoses:  None    Rx / DC Orders ED Discharge Orders     None           Lajean Saver, MD 06/24/21 [phone]

## 2021-06-24 NOTE — ED Notes (Addendum)
Pt removed from EMS sheets, pt's periarea cleaned and dried, rectal temp obtained, Purewick placed and hooked up to suction.  Pt repositioned in bed w/ pillow placed behind head. Pt covered w/ sheet, pt refused warm blankets. ED Tech and ED RN at bedside to preform above tasks. No breakdown or pressure sore noted to pt's coccyx.

## 2021-06-24 NOTE — Progress Notes (Signed)
ANTICOAGULATION CONSULT NOTE - Initial Consult  Pharmacy Consult for warfarin Indication: atrial fibrillation  Allergies  Allergen Reactions   Wheat Bran Shortness Of Breath   Kiwi Extract Other (See Comments)    Causes mouth sores   Orange Fruit [Citrus] Other (See Comments)    Causes mouth sores   Phenergan [Promethazine]     Other reaction(s): disoriented   Pineapple Other (See Comments)    Causes mouth sores    Promethazine Hcl Other (See Comments)    Disorientation    Tomato Other (See Comments)    Causes mouth sores   Tylenol [Acetaminophen] Other (See Comments)    Causes fast heart rate   Vistaril [Hydroxyzine Hcl] Other (See Comments)    confusion   Codeine Itching and Rash   Tape Other (See Comments) and Rash    Blisters on skin    Patient Measurements: Height: 5\' 6"  (167.6 cm) Weight: 73.5 kg (162 lb) IBW/kg (Calculated) : 59.3  Vital Signs: Temp: 98.1 F (36.7 C) (12/27 1910) Temp Source: Oral (12/27 1910) BP: 108/66 (12/27 1920) Pulse Rate: 93 (12/27 1920)  Labs: Recent Labs    06/24/21 1510  HGB 10.0*  HCT 32.2*  PLT 217  LABPROT 19.4*  INR 1.6*  CREATININE 0.67    Estimated Creatinine Clearance: 48.9 mL/min (by C-G formula based on SCr of 0.67 mg/dL).   Medical History: Past Medical History:  Diagnosis Date   AF (atrial fibrillation) (HCC)    Arthritis    Asthma    Diabetes mellitus    TYPE 2   Hyperlipidemia    Hypertension    PNA (pneumonia)     Medications:  Warfarin PTA regimen: 2.5 mg daily except 5 mg on Mon & Fri, last dose 12/25 2.5 mg  Assessment: 87 y.o. female with medical history significant for asthma, CHF, chronic hypoxemia on 2 L, permanent atrial fibrillation on Coumadin, hypertension, type 2 diabetes, and hyperlipidemia who presents following a fall.  Hemoglobin slightly low at 10, no bleeding documented, today's INR subtherapeutic and pharmacy consulted to dose warfarin.  Goal of Therapy:  INR 2-3 Monitor  platelets by anticoagulation protocol: Yes   Plan:  Warfarin 5 mg po x 1 this evening Monitor daily PT/INR for warfarin dose, CBC, signs/symptoms of bleeding   Royetta Asal, PharmD, [place] Please utilize Amion for appropriate phone number to reach the unit pharmacist ([place]) 06/24/2021 8:14 PM

## 2021-06-25 DIAGNOSIS — R791 Abnormal coagulation profile: Secondary | ICD-10-CM

## 2021-06-25 DIAGNOSIS — I4891 Unspecified atrial fibrillation: Secondary | ICD-10-CM

## 2021-06-25 DIAGNOSIS — G9341 Metabolic encephalopathy: Secondary | ICD-10-CM

## 2021-06-25 LAB — URINE CULTURE

## 2021-06-25 LAB — CBC
HCT: 28.4 % — ABNORMAL LOW (ref 36.0–46.0)
Hemoglobin: 9 g/dL — ABNORMAL LOW (ref 12.0–15.0)
MCH: 27.4 pg (ref 26.0–34.0)
MCHC: 31.7 g/dL (ref 30.0–36.0)
MCV: 86.6 fL (ref 80.0–100.0)
Platelets: 180 10*3/uL (ref 150–400)
RBC: 3.28 MIL/uL — ABNORMAL LOW (ref 3.87–5.11)
RDW: 18.4 % — ABNORMAL HIGH (ref 11.5–15.5)
WBC: 7.5 10*3/uL (ref 4.0–10.5)
nRBC: 0 % (ref 0.0–0.2)

## 2021-06-25 LAB — PROTIME-INR
INR: 1.8 — ABNORMAL HIGH (ref 0.8–1.2)
Prothrombin Time: 20.7 seconds — ABNORMAL HIGH (ref 11.4–15.2)

## 2021-06-25 MED ORDER — LORATADINE 10 MG PO TABS
10.0000 mg | ORAL_TABLET | Freq: Every day | ORAL | Status: DC
  Administered 2021-06-25 – 2021-06-28 (×4): 10 mg via ORAL
  Filled 2021-06-25 (×4): qty 1

## 2021-06-25 MED ORDER — ALBUTEROL SULFATE (2.5 MG/3ML) 0.083% IN NEBU: 3.0000 mL | INHALATION_SOLUTION | Freq: Four times a day (QID) | RESPIRATORY_TRACT | Status: DC | PRN

## 2021-06-25 MED ORDER — ENSURE MAX PROTEIN PO LIQD
11.0000 [oz_av] | Freq: Two times a day (BID) | ORAL | Status: DC
  Administered 2021-06-25 – 2021-06-27 (×6): 11 [oz_av] via ORAL
  Filled 2021-06-25 (×7): qty 330

## 2021-06-25 MED ORDER — METOPROLOL SUCCINATE ER 50 MG PO TB24
150.0000 mg | ORAL_TABLET | Freq: Every day | ORAL | Status: DC
  Administered 2021-06-25 – 2021-06-28 (×4): 150 mg via ORAL
  Filled 2021-06-25 (×4): qty 3

## 2021-06-25 MED ORDER — TRAMADOL HCL 50 MG PO TABS: 50.0000 mg | ORAL_TABLET | Freq: Four times a day (QID) | ORAL | Status: DC | PRN

## 2021-06-25 MED ORDER — ASCORBIC ACID 500 MG PO TABS
1000.0000 mg | ORAL_TABLET | Freq: Every day | ORAL | Status: DC
  Administered 2021-06-25 – 2021-06-28 (×4): 1000 mg via ORAL
  Filled 2021-06-25 (×4): qty 2

## 2021-06-25 MED ORDER — ADULT MULTIVITAMIN W/MINERALS CH
1.0000 | ORAL_TABLET | Freq: Every day | ORAL | Status: DC
Start: 2021-06-25 — End: 2021-06-28
  Administered 2021-06-25 – 2021-06-28 (×4): 1 via ORAL
  Filled 2021-06-25 (×4): qty 1

## 2021-06-25 MED ORDER — VITAMIN D3 25 MCG (1000 UNIT) PO TABS
2000.0000 [IU] | ORAL_TABLET | Freq: Every day | ORAL | Status: DC
  Administered 2021-06-25 – 2021-06-28 (×4): 2000 [IU] via ORAL
  Filled 2021-06-25 (×4): qty 2

## 2021-06-25 MED ORDER — METOPROLOL SUCCINATE ER 50 MG PO TB24: 150.0000 mg | ORAL_TABLET | Freq: Every day | ORAL | Status: DC

## 2021-06-25 MED ORDER — WARFARIN SODIUM 5 MG PO TABS
5.0000 mg | ORAL_TABLET | Freq: Once | ORAL | Status: AC
  Administered 2021-06-25: 17:00:00 5 mg via ORAL
  Filled 2021-06-25: qty 1

## 2021-06-25 MED ORDER — METOPROLOL TARTRATE 5 MG/5ML IV SOLN
2.5000 mg | Freq: Four times a day (QID) | INTRAVENOUS | Status: DC | PRN
  Administered 2021-06-25: 14:00:00 2.5 mg via INTRAVENOUS
  Filled 2021-06-25: qty 5

## 2021-06-25 MED ORDER — PANTOPRAZOLE SODIUM 40 MG PO TBEC
40.0000 mg | DELAYED_RELEASE_TABLET | Freq: Every day | ORAL | Status: DC
  Administered 2021-06-25 – 2021-06-28 (×4): 40 mg via ORAL
  Filled 2021-06-25 (×4): qty 1

## 2021-06-25 MED ORDER — JUVEN PO PACK
1.0000 | PACK | Freq: Two times a day (BID) | ORAL | Status: DC
  Administered 2021-06-26 – 2021-06-28 (×4): 1 via ORAL
  Filled 2021-06-25 (×6): qty 1

## 2021-06-25 MED ORDER — FLUTICASONE FUROATE-VILANTEROL 100-25 MCG/ACT IN AEPB
1.0000 | INHALATION_SPRAY | Freq: Every day | RESPIRATORY_TRACT | Status: DC
  Administered 2021-06-25 – 2021-06-26 (×2): 1 via RESPIRATORY_TRACT
  Filled 2021-06-25: qty 28

## 2021-06-25 NOTE — Progress Notes (Signed)
ANTICOAGULATION CONSULT NOTE - Follow Up Consult  Pharmacy Consult for Warfarin  Indication: atrial fibrillation  Allergies  Allergen Reactions   Wheat Bran Shortness Of Breath   Kiwi Extract Other (See Comments)    Causes mouth sores   Orange Fruit [Citrus] Other (See Comments)    Causes mouth sores   Phenergan [Promethazine]     Other reaction(s): disoriented   Pineapple Other (See Comments)    Causes mouth sores    Promethazine Hcl Other (See Comments)    Disorientation    Tomato Other (See Comments)    Causes mouth sores   Tylenol [Acetaminophen] Other (See Comments)    Causes fast heart rate   Vistaril [Hydroxyzine Hcl] Other (See Comments)    confusion   Codeine Itching and Rash   Tape Other (See Comments) and Rash    Blisters on skin    Patient Measurements: Height: 5\' 6"  (167.6 cm) Weight: 80 kg (176 lb 5.9 oz) IBW/kg (Calculated) : 59.3  Vital Signs: Temp: 98.6 F (37 C) (12/28 0520) Temp Source: Oral (12/28 0520) BP: 106/67 (12/28 0520) Pulse Rate: 110 (12/28 0520)  Labs: Recent Labs    06/24/21 1510 06/25/21 0546  HGB 10.0* 9.0*  HCT 32.2* 28.4*  PLT 217 180  LABPROT 19.4* 20.7*  INR 1.6* 1.8*  CREATININE 0.67  --     Estimated Creatinine Clearance: 50.9 mL/min (by C-G formula based on SCr of 0.67 mg/dL).   Medications:  Scheduled:   vitamin C  1,000 mg Oral Daily   bacitracin   Topical BID   cholecalciferol  2,000 Units Oral Daily   fluticasone furoate-vilanterol  1 puff Inhalation QHS   loratadine  10 mg Oral Daily   metoprolol succinate  150 mg Oral QHS   pantoprazole  40 mg Oral Daily   Warfarin - Pharmacist Dosing Inpatient   Does not apply q1600   Infusions:   cefTRIAXone (ROCEPHIN)  IV     PRN: albuterol, traMADol  Assessment: 87 y.o. female with medical history significant for asthma, CHF, chronic hypoxemia on 2 L, permanent atrial fibrillation on Coumadin, hypertension, type 2 diabetes, and hyperlipidemia who presents  following a fall. CT/C-spine neg for bleed.  Today, 06/25/2021 INR 1.8, slightly subtherapeutic Hgb 9.0, decreased slightly, Plts WNL No bleeding reported No drug interactions noted Dysphagia 1 diet ordered  Goal of Therapy:  INR 2-3   Plan:  Warfarin 5mg  PO x 1 tonight at 4P Daily PT/INR Monitor for signs/symptoms of bleeding  Peggyann Juba, PharmD, BCPS Pharmacy: [phone] 06/25/2021,7:52 AM

## 2021-06-25 NOTE — Progress Notes (Signed)
Initial Nutrition Assessment  INTERVENTION:   -Ensure MAX Protein po BID, each supplement provides 150 kcal and 30 grams of protein   -Multivitamin with minerals daily  -1 packet Juven BID, each packet provides 95 calories, 2.5 grams of protein (collagen), and 9.8 grams of carbohydrate (3 grams sugar); also contains 7 grams of L-arginine and L-glutamine, 300 mg vitamin C, 15 mg vitamin E, 1.2 mcg vitamin B-12, 9.5 mg zinc, 200 mg calcium, and 1.5 g  Calcium Beta-hydroxy-Beta-methylbutyrate to support wound healing    NUTRITION DIAGNOSIS:   Increased nutrient needs related to wound healing as evidenced by estimated needs.  GOAL:   Patient will meet greater than or equal to 90% of their needs  MONITOR:   PO intake, Supplement acceptance, Labs, Weight trends, I & O's, Skin  REASON FOR ASSESSMENT:   Malnutrition Screening Tool    ASSESSMENT:   87 y.o. female with medical history significant for asthma, CHF, chronic hypoxemia on 2 L, permanent atrial fibrillation on Coumadin, hypertension, type 2 diabetes, and hyperlipidemia who presents following a fall.  Patient in room, no family present at time of visit. Pt reports feeling very hungry but she had not received any breakfast this morning. States she was on liquids last night. Pt was awaiting speech evaluation. Per SLP note now, pt can have regular diet with no restrictions.  Pt did report to RD that she sometimes get choked up r/t kyphosis. She sometimes has sore mouth which limits her chewing as well. Pt agreeable to protein shakes while diet may be limited given multiple food allergies: citrus, tomato, kiwi, pineapple and wheat products. States she consumes gluten free bread and pasta at home.   Admission weight: 162 lbs. Current weight: 176 lbs Per weight records, pt has had 16 lb weight loss since 2/9 (8% wt loss x 10.5 months, insignificant for time frame).  Medications: Vitamin C, Vitamin D  Labs reviewed:  CBGs:  127  NUTRITION - FOCUSED PHYSICAL EXAM:  Flowsheet Row Most Recent Value  Orbital Region No depletion  Upper Arm Region Mild depletion  Thoracic and Lumbar Region Unable to assess  Buccal Region Mild depletion  Temple Region Mild depletion  Clavicle Bone Region No depletion  Clavicle and Acromion Bone Region No depletion  Scapular Bone Region No depletion  Dorsal Hand Unable to assess  [injured and bruised]  Patellar Region Unable to assess  Anterior Thigh Region Unable to assess  Posterior Calf Region Unable to assess  Edema (RD Assessment) Mild  [RUE]  Hair Reviewed  Eyes Reviewed  Mouth Reviewed  [missing teeth]  Skin Reviewed  [bruising from fall]       Diet Order:   Diet Order             Diet regular Room service appropriate? Yes; Fluid consistency: Thin  Diet effective now                   EDUCATION NEEDS:   No education needs have been identified at this time  Skin:  Skin Assessment: Skin Integrity Issues: Skin Integrity Issues:: Stage I, Stage II, Stage III, Other (Comment) Stage I: bilateral heels Stage II: mid jaw Stage III: sternum Other: skin tear right hand  Last BM:  12/27 -type 5  Height:   Ht Readings from Last 1 Encounters:  06/24/21 5\' 6"  (1.676 m)    Weight:   Wt Readings from Last 1 Encounters:  06/25/21 80 kg    BMI:  Body mass index  is 28.47 kg/m.  Estimated Nutritional Needs:   Kcal:  1850-2050  Protein:  105-115g  Fluid:  2L/day  Clayton Bibles, MS, RD, LDN Inpatient Clinical Dietitian Contact information available via Amion

## 2021-06-25 NOTE — Progress Notes (Signed)
°   06/25/21 1400  Assess: MEWS Score  Temp 98.6 F (37 C)  BP 111/86  Pulse Rate (!) 138  Assess: MEWS Score  MEWS Temp 0  MEWS Systolic 0  MEWS Pulse 3  MEWS RR 0  MEWS LOC 0  MEWS Score 3  MEWS Score Color Yellow  Assess: if the MEWS score is Yellow or Red  Were vital signs taken at a resting state? Yes  Focused Assessment No change from prior assessment  Does the patient meet 2 or more of the SIRS criteria? Yes  Does the patient have a confirmed or suspected source of infection? No  MEWS guidelines implemented *See Row Information* Yes  Treat  MEWS Interventions Administered scheduled meds/treatments  Pain Scale 0-10  Pain Score 0  Take Vital Signs  Increase Vital Sign Frequency  Yellow: Q 2hr X 2 then Q 4hr X 2, if remains yellow, continue Q 4hrs  Escalate  MEWS: Escalate Yellow: discuss with charge nurse/RN and consider discussing with provider and RRT  Notify: Charge Nurse/RN  Name of Charge Nurse/RN Notified Meredith Pel ,RN  Date Charge Nurse/RN Notified 06/25/21  Time Charge Nurse/RN Notified 1405  Notify: Provider  Provider Name/Title Lupita Leash, Ramesh,MD  Date Provider Notified 06/25/21  Time Provider Notified 1400  Notification Type Page  Notification Reason Change in status  Provider response See new orders  Date of Provider Response 06/25/21  Time of Provider Response 1403  Document  Patient Outcome Stabilized after interventions  Progress note created (see row info) Yes  Assess: SIRS CRITERIA  SIRS Temperature  0  SIRS Pulse 1  SIRS Respirations  0  SIRS WBC 0  SIRS Score Sum  1

## 2021-06-25 NOTE — Evaluation (Signed)
Clinical/Bedside Swallow Evaluation Patient Details  Name: Casey Patel MRN: [id number] Date of Birth: [birth year]  Today's Date: 06/25/2021 Time: SLP Start Time (ACUTE ONLY): 30 SLP Stop Time (ACUTE ONLY): 1122 SLP Time Calculation (min) (ACUTE ONLY): 20 min  Past Medical History:  Past Medical History:  Diagnosis Date   AF (atrial fibrillation) (HCC)    Arthritis    Asthma    Diabetes mellitus    TYPE 2   Hyperlipidemia    Hypertension    PNA (pneumonia)    Past Surgical History:  Past Surgical History:  Procedure Laterality Date   ABDOMINAL HYSTERECTOMY  1976   BACK SURGERY     lumb x 3   CARDIOVERSION  12/07/2011   Procedure: CARDIOVERSION;  Surgeon: Peter M Martinique, MD;  Location: [facility];  Service: Cardiovascular;  Laterality: N/A;   CARPAL TUNNEL RELEASE  09/15/2011   Procedure: CARPAL TUNNEL RELEASE;  Surgeon: Wynonia Sours, MD;  Location: [facility];  Service: Orthopedics;  Laterality: Right;   CATARACT EXTRACTION     CHOLECYSTECTOMY  2003   HAND TENDON SURGERY  1999   left wrist - thumb   LAMINECTOMY  1999   with fusion (L1-5)   LAMINECTOMY  19995   with fusion (L3-4)   TONSILLECTOMY  2831   HPI:  Casey Patel is a 87 y.o. female with medical history significant for asthma, CHF, chronic hypoxemia on 2 L, permanent atrial fibrillation on Coumadin, hypertension, type 2 diabetes, and hyperlipidemia who presents following a fall at home. Dx acute metabolic encephalopathy; UTI, pressure ulcer of anterior chest (pt's head held in complete flexion due to severe kyphosis of C-spine leading to resulting pressure ulcer)    Assessment / Plan / Recommendation  Clinical Impression  Despite cervical kyphosis, pt presents with normal swallow function with no dysphagia.  There is normal oral control/manipulation, brisk swallow response, no s/s of aspiration.  Pt reports no baseline swallowing deficits.  Normal oral mechanism exam.  Recommend resuming a regular diet;  meds whole; thin liquids. No SLP F/u needed. SLP Visit Diagnosis: Dysphagia, unspecified (R13.10)    Aspiration Risk  No limitations    Diet Recommendation   Regular solids, thin liquids  Medication Administration: Whole meds with liquid    Other  Recommendations Oral Care Recommendations: Oral care BID    Recommendations for follow up therapy are one component of a multi-disciplinary discharge planning process, led by the attending physician.  Recommendations may be updated based on patient status, additional functional criteria and insurance authorization.  Follow up Recommendations No SLP follow up      Assistance Recommended at Discharge    Functional Status Assessment    Frequency and Duration            Prognosis        Swallow Study   General Date of Onset: 51/76/16 HPI: Casey Patel is a 87 y.o. female with medical history significant for asthma, CHF, chronic hypoxemia on 2 L, permanent atrial fibrillation on Coumadin, hypertension, type 2 diabetes, and hyperlipidemia who presents following a fall at home. Dx acute metabolic encephalopathy; UTI, pressure ulcer of anterior chest (pt's head held in complete flexion due to severe kyphosis of C-spine leading to resulting pressure ulcer) Type of Study: Bedside Swallow Evaluation Previous Swallow Assessment: no Diet Prior to this Study: Dysphagia 1 (puree);Thin liquids Temperature Spikes Noted: No Respiratory Status: Nasal cannula History of Recent Intubation: No Behavior/Cognition: Alert;Cooperative;Pleasant mood Oral Cavity Assessment: Within Functional Limits Oral  Care Completed by SLP: Recent completion by staff Oral Cavity - Dentition: Adequate natural dentition Vision: Functional for self-feeding Self-Feeding Abilities: Able to feed self Patient Positioning: Upright in bed Baseline Vocal Quality: Normal Volitional Cough: Strong Volitional Swallow: Able to elicit    Oral/Motor/Sensory Function Overall Oral  Motor/Sensory Function: Within functional limits   Ice Chips Ice chips: Within functional limits   Thin Liquid Thin Liquid: Within functional limits    Nectar Thick Nectar Thick Liquid: Not tested   Honey Thick Honey Thick Liquid: Not tested   Puree Puree: Within functional limits   Solid     Solid: Not tested (pt allergic to crackers)      Casey Patel 06/25/2021,11:29 AM  Estill Bamberg L. Tivis Ringer, [place] Office number [phone] Pager [phone]

## 2021-06-25 NOTE — Plan of Care (Signed)
Care plan initiated. Kjones, RN 

## 2021-06-25 NOTE — Progress Notes (Signed)
RN admit note: pt rec'd from ER, via stretcher, very limited mobility with injury to hands. Pt oriented to unit and room. Placed on falls precautions, oriented to falls precautions, see flowsheet for physical assessment. Call bell within reach, educated on use. Placed on telemetry, Afib cont. Rate 90s, notified Vaughan Basta, patient sister on arrival to unit. No distress noted, will continue to monitor pt closely. Neomia Dear RN

## 2021-06-25 NOTE — Progress Notes (Signed)
PROGRESS NOTE    Casey Patel  KNL:[id number] DOB: [birth year] DOA: 06/24/2021 PCP: [name], MD   Chief Complaint  Patient presents with   Fall   Altered Mental Status    Brief Narrative/Hospital Course:  Casey Patel, 87 y.o. female with PMH of  asthma, CHF, chronic hypoxemia on 2 L, permanent atrial fibrillation on Coumadin, hypertension, type 2 diabetes, and hyperlipidemia presented after a fall around 2 AM from home where she lives with her sister.  Sister found her on the ground away from her walker in the bedroom and had to get help from her grandson patient had no recollection of the event and found to have significant skin tear on her right hand EMS was called brought to the ED In the ED was in A. fib with RVR rate 06/29/2008-120 blood pressure stable, on 2 L nasal cannula from home WBC count 10.2 hemoglobin 10 stable renal function CT head, CT C-spine, x-ray of the right hand and wrist knees were done no acute fracture but severe degenerative changes and soft tissue swelling on the hand UA was positive for nitrate large leukocytes, suspected to have UTI and admitted on antibiotics   Subjective: Seen and examined this morning.  Resting comfortably alert awake oriented Overnight afebrile.  Labs with a stable CBC INR 1.8  Assessment & Plan:  Urinary tract infection POA: Continue ceftriaxone follow-up urine culture Recent Labs  Lab 06/24/21 1510 06/25/21 0546  WBC 10.2 7.5     Fall at home: Imaging with CT head CT C-spine x-rays of the knees right hand negative fracture but severe degenerative changes on hand.  Likely due to deconditioning, PT OT eval requested.  Lives wit her Sister.  Acute metabolic encephalopathy due to UTI CT head no acute finding.  Mentation fairly stable and improved.  A. fib with RVR status post Cardizem IV transitioned to home metoprolol on home Coumadin -pharmacy managing, INR subtherapeutic Recent Labs  Lab 06/24/21 1510 06/25/21 0546   INR 1.6* 1.8*    Pressure ulcer at multiple sites POA, see below  Right upper skin abrasion status post fall.  Continue wound care  Severe kyphosis of the cervical spine- SLP eval requested having some issue with eating  DVT prophylaxis: Coumadin Code Status:   Code Status: DNR Family Communication: plan of care discussed with patient at bedside. Status is: Inpatient Remains inpatient appropriate because: Ongoing management UTI, deconditioning and debility Disposition: Currently not medically stable for discharge. Anticipated Disposition: TBD.  Pending PT OT eval  Objective: Vitals last 24 hrs: Vitals:   06/25/21 0123 06/25/21 0310 06/25/21 0520 06/25/21 0805  BP: (!) 100/55 106/68 106/67 110/67  Pulse: 99 (!) 108 (!) 110 99  Resp: 14  16 20   Temp: 98.8 F (37.1 C)  98.6 F (37 C) 98.5 F (36.9 C)  TempSrc: Oral  Oral Oral  SpO2: 92%  92% 93%  Weight:   80 kg   Height:       Weight change:   Intake/Output Summary (Last 24 hours) at 06/25/2021 0813 Last data filed at 06/25/2021 0534 Gross per 24 hour  Intake 433.39 ml  Output 100 ml  Net 333.39 ml   Net IO Since Admission: 333.39 mL [06/25/21 0813]   Physical Examination: General exam: AA0, obese, pleasant, elderly eak HEENT:Oral mucosa moist, Ear/Nose WNL grossly,dentition normal. Respiratory system: B/l clear BS, no use of accessory muscle, non tender. Cardiovascular system: S1 & S2 +,No JVD. Gastrointestinal system: Abdomen soft, NT,ND, BS+. Nervous System:Alert,  awake, moving extremities. Extremities: edema none, distal peripheral pulses palpable.  Skin: No rashes, no icterus. MSK: Normal muscle bulk, tone, power.  Medications reviewed:  Scheduled Meds:  vitamin C  1,000 mg Oral Daily   bacitracin   Topical BID   cholecalciferol  2,000 Units Oral Daily   fluticasone furoate-vilanterol  1 puff Inhalation QHS   loratadine  10 mg Oral Daily   metoprolol succinate  150 mg Oral QHS   pantoprazole  40 mg  Oral Daily   warfarin  5 mg Oral ONCE-1600   Warfarin - Pharmacist Dosing Inpatient   Does not apply q1600   Continuous Infusions:  cefTRIAXone (ROCEPHIN)  IV     Pressure Injury 03/07/18 Stage II -  Partial thickness loss of dermis presenting as a shallow open ulcer with a red, pink wound bed without slough. (Active)  03/07/18 2130  Location: Buttocks  Location Orientation: Medial  Staging: Stage II -  Partial thickness loss of dermis presenting as a shallow open ulcer with a red, pink wound bed without slough.  Wound Description (Comments):   Present on Admission: Yes     Pressure Injury 03/07/18 Stage I -  Intact skin with non-blanchable redness of a localized area usually over a bony prominence. (Active)  03/07/18 2130  Location: Buttocks  Location Orientation: Mid  Staging: Stage I -  Intact skin with non-blanchable redness of a localized area usually over a bony prominence.  Wound Description (Comments):   Present on Admission: Yes     Pressure Injury 06/24/21 Sternum Upper;Mid Stage 3 -  Full thickness tissue loss. Subcutaneous fat may be visible but bone, tendon or muscle are NOT exposed. (Active)  06/24/21   Location: Sternum  Location Orientation: Upper;Mid  Staging: Stage 3 -  Full thickness tissue loss. Subcutaneous fat may be visible but bone, tendon or muscle are NOT exposed.  Wound Description (Comments):   Present on Admission: Yes     Pressure Injury 06/24/21 Jaw Mid Stage 2 -  Partial thickness loss of dermis presenting as a shallow open injury with a red, pink wound bed without slough. (Active)  06/24/21   Location: Jaw  Location Orientation: Mid  Staging: Stage 2 -  Partial thickness loss of dermis presenting as a shallow open injury with a red, pink wound bed without slough.  Wound Description (Comments):   Present on Admission: Yes     Pressure Injury 06/24/21 Heel Left;Right Stage 1 -  Intact skin with non-blanchable redness of a localized area usually  over a bony prominence. (Active)  06/24/21   Location: Heel  Location Orientation: Left;Right  Staging: Stage 1 -  Intact skin with non-blanchable redness of a localized area usually over a bony prominence.  Wound Description (Comments):   Present on Admission: Yes    Diet Order             DIET - DYS 1 Room service appropriate? Yes; Fluid consistency: Thin  Diet effective now                  Weight change:   Wt Readings from Last 3 Encounters:  06/25/21 80 kg  08/07/20 80.9 kg  08/08/19 84.5 kg     Consultants:see note  Procedures:see note Antimicrobials: Anti-infectives (From admission, onward)    Start     Dose/Rate Route Frequency Ordered Stop   06/25/21 1000  cefTRIAXone (ROCEPHIN) 1 g in sodium chloride 0.9 % 100 mL IVPB  1 g 200 mL/hr over 30 Minutes Intravenous Every 24 hours 06/24/21 1936     06/24/21 1730  cefTRIAXone (ROCEPHIN) 1 g in sodium chloride 0.9 % 100 mL IVPB        1 g 200 mL/hr over 30 Minutes Intravenous  Once 06/24/21 1720 06/24/21 1820      Culture/Microbiology    Component Value Date/Time   SDES  07/23/2020 1828    URINE, CATHETERIZED Performed at [facility], [place] [address] [place], [place] [address]    [place]  07/23/2020 1828    NONE Performed at [facility], [place] [address], [place], [place] [address]    CULT (A) 07/23/2020 1828    <10,000 COLONIES/mL INSIGNIFICANT GROWTH Performed at [facility] [address] [place], [place] [address]    REPTSTATUS 07/25/2020 FINAL 07/23/2020 1828    Other culture-see note  Unresulted Labs (From admission, onward)     Start     Ordered   06/25/21 0500  Protime-INR  Daily,   R      06/24/21 1958   06/24/21 1721  Urine Culture  Once,   STAT       Question:  Indication  Answer:  Altered mental status (if no other cause identified)   06/24/21 1720          Data Reviewed: I have personally reviewed following labs and  imaging studies CBC: Recent Labs  Lab 06/24/21 1510 06/25/21 0546  WBC 10.2 7.5  HGB 10.0* 9.0*  HCT 32.2* 28.4*  MCV 87.5 86.6  PLT 217 509   Basic Metabolic Panel: Recent Labs  Lab 06/24/21 1510  NA 139  K 4.9  CL 103  CO2 28  GLUCOSE 122*  BUN 24*  CREATININE 0.67  CALCIUM 7.8*   GFR: Estimated Creatinine Clearance: 50.9 mL/min (by C-G formula based on SCr of 0.67 mg/dL). Liver Function Tests: Recent Labs  Lab 06/24/21 1510  AST 31  ALT 15  ALKPHOS 47  BILITOT 1.7*  PROT 6.5  ALBUMIN 3.2*   No results for input(s): LIPASE, AMYLASE in the last 168 hours. No results for input(s): AMMONIA in the last 168 hours. Coagulation Profile: Recent Labs  Lab 06/24/21 1510 06/25/21 0546  INR 1.6* 1.8*   Cardiac Enzymes: No results for input(s): CKTOTAL, CKMB, CKMBINDEX, TROPONINI in the last 168 hours. BNP (last 3 results) No results for input(s): PROBNP in the last 8760 hours. HbA1C: No results for input(s): HGBA1C in the last 72 hours. CBG: Recent Labs  Lab 06/24/21 2049  GLUCAP 127*   Lipid Profile: No results for input(s): CHOL, HDL, LDLCALC, TRIG, CHOLHDL, LDLDIRECT in the last 72 hours. Thyroid Function Tests: No results for input(s): TSH, T4TOTAL, FREET4, T3FREE, THYROIDAB in the last 72 hours. Anemia Panel: No results for input(s): VITAMINB12, FOLATE, FERRITIN, TIBC, IRON, RETICCTPCT in the last 72 hours. Sepsis Labs: No results for input(s): PROCALCITON, LATICACIDVEN in the last 168 hours.  Recent Results (from the past 240 hour(s))  Resp Panel by RT-PCR (Flu A&B, Covid) Nasopharyngeal Swab     Status: None   Collection Time: 06/24/21  3:14 PM   Specimen: Nasopharyngeal Swab; Nasopharyngeal(NP) swabs in vial transport medium  Result Value Ref Range Status   SARS Coronavirus 2 by RT PCR NEGATIVE NEGATIVE Final    Comment: (NOTE) SARS-CoV-2 target nucleic acids are NOT DETECTED.  The SARS-CoV-2 RNA is generally detectable in upper  respiratory specimens during the acute phase of infection. The lowest  concentration of SARS-CoV-2 viral copies this assay can detect is 138 copies/mL. A negative result does not preclude SARS-Cov-2 infection and should not be used as the sole basis for treatment or other patient management decisions. A negative result may occur with  improper specimen collection/handling, submission of specimen other than nasopharyngeal swab, presence of viral mutation(s) within the areas targeted by this assay, and inadequate number of viral copies(<138 copies/mL). A negative result must be combined with clinical observations, patient history, and epidemiological information. The expected result is Negative.  Fact Sheet for Patients:  EntrepreneurPulse.com.au  Fact Sheet for Healthcare Providers:  IncredibleEmployment.be  This test is no t yet approved or cleared by the [place] FDA and  has been authorized for detection and/or diagnosis of SARS-CoV-2 by FDA under an Emergency Use Authorization (EUA). This EUA will remain  in effect (meaning this test can be used) for the duration of the COVID-19 declaration under Section 564(b)(1) of the Act, 21 U.S.C.section 360bbb-3(b)(1), unless the authorization is terminated  or revoked sooner.       Influenza A by PCR NEGATIVE NEGATIVE Final   Influenza B by PCR NEGATIVE NEGATIVE Final    Comment: (NOTE) The Xpert Xpress SARS-CoV-2/FLU/RSV plus assay is intended as an aid in the diagnosis of influenza from Nasopharyngeal swab specimens and should not be used as a sole basis for treatment. Nasal washings and aspirates are unacceptable for Xpert Xpress SARS-CoV-2/FLU/RSV testing.  Fact Sheet for Patients: EntrepreneurPulse.com.au  Fact Sheet for Healthcare Providers: IncredibleEmployment.be  This test is not yet approved or cleared by the [place] FDA and has been  authorized for detection and/or diagnosis of SARS-CoV-2 by FDA under an Emergency Use Authorization (EUA). This EUA will remain in effect (meaning this test can be used) for the duration of the COVID-19 declaration under Section 564(b)(1) of the Act, 21 U.S.C. section 360bbb-3(b)(1), unless the authorization is terminated or revoked.  Performed at [facility], [place] [address] [place], [place] [address]      Radiology Studies: DG Wrist Complete Right  Result Date: 06/24/2021 CLINICAL DATA:  Right wrist pain after fall yesterday. EXAM: RIGHT WRIST - COMPLETE 3+ VIEW COMPARISON:  March 09, 2018. FINDINGS: There is no evidence of fracture or dislocation. Severe degenerative changes seen involving the radiocarpal joint as well as the first metacarpophalangeal joint. Soft tissues are unremarkable. IMPRESSION: Severe degenerative changes as described above. No acute abnormality seen. Electronically Signed   By: Marijo Conception M.D.   On: 06/24/2021 16:14   CT HEAD WO CONTRAST (5MM)  Result Date: 06/24/2021 CLINICAL DATA:  Head trauma, minor. Increased falls with weakness and confusion. EXAM: CT HEAD WITHOUT CONTRAST CT CERVICAL SPINE WITHOUT CONTRAST TECHNIQUE: Multidetector CT imaging of the head and cervical spine was performed following the standard protocol without intravenous contrast. Multiplanar CT image reconstructions of the cervical spine were also generated. COMPARISON:  Prior CTs 07/16/2011 FINDINGS: CT HEAD FINDINGS Due to extreme cervicothoracic kyphosis, the images were acquired in a direct nearly coronal plane. Plane of imaging affects image quality. Brain: There is no evidence of acute intracranial hemorrhage, mass lesion, brain edema or extra-axial fluid collection. The ventricles and subarachnoid spaces are appropriately sized for age. Low-density in the periventricular white matter appear similar to previous study, likely due to chronic small vessel ischemic  changes. No evidence of acute cortical based infarct. Vascular: Prominent intracranial vascular calcifications. No hyperdense vessel identified. Skull: Negative for fracture or focal lesion. Calvarial hyperostosis noted. Sinuses/Orbits: The  visualized paranasal sinuses and mastoid air cells are clear. No orbital abnormalities are seen. Other: Advanced TMJ degenerative changes bilaterally. CT CERVICAL SPINE FINDINGS Alignment: Exaggerated cervicothoracic kyphosis without evidence of focal angulation or progressive listhesis. Skull base and vertebrae: No evidence of acute cervical spine fracture or traumatic subluxation. There is multilevel spondylosis. The right C3-4 facet joint appears ankylosed. As above, bilateral TMJ osteoarthritis. Soft tissues and spinal canal: No prevertebral fluid or swelling. No visible canal hematoma. Disc levels: Chronic multilevel spondylosis with endplate osteophytes and facet hypertrophy contributing to mild foraminal narrowing at multiple levels. No large disc herniation identified. Upper chest: Emphysematous changes at the lung apices. Aortic and great vessel atherosclerosis. Other: None. IMPRESSION: 1. No acute intracranial or calvarial findings. 2. Chronic small vessel ischemic changes in the periventricular white matter. 3. No evidence of acute cervical spine fracture, traumatic subluxation or static signs of instability. 4. Chronic cervicothoracic kyphosis and multilevel spondylosis. Electronically Signed   By: Richardean Sale M.D.   On: 06/24/2021 16:17   CT CERVICAL SPINE WO CONTRAST  Result Date: 06/24/2021 CLINICAL DATA:  Head trauma, minor. Increased falls with weakness and confusion. EXAM: CT HEAD WITHOUT CONTRAST CT CERVICAL SPINE WITHOUT CONTRAST TECHNIQUE: Multidetector CT imaging of the head and cervical spine was performed following the standard protocol without intravenous contrast. Multiplanar CT image reconstructions of the cervical spine were also generated.  COMPARISON:  Prior CTs 07/16/2011 FINDINGS: CT HEAD FINDINGS Due to extreme cervicothoracic kyphosis, the images were acquired in a direct nearly coronal plane. Plane of imaging affects image quality. Brain: There is no evidence of acute intracranial hemorrhage, mass lesion, brain edema or extra-axial fluid collection. The ventricles and subarachnoid spaces are appropriately sized for age. Low-density in the periventricular white matter appear similar to previous study, likely due to chronic small vessel ischemic changes. No evidence of acute cortical based infarct. Vascular: Prominent intracranial vascular calcifications. No hyperdense vessel identified. Skull: Negative for fracture or focal lesion. Calvarial hyperostosis noted. Sinuses/Orbits: The visualized paranasal sinuses and mastoid air cells are clear. No orbital abnormalities are seen. Other: Advanced TMJ degenerative changes bilaterally. CT CERVICAL SPINE FINDINGS Alignment: Exaggerated cervicothoracic kyphosis without evidence of focal angulation or progressive listhesis. Skull base and vertebrae: No evidence of acute cervical spine fracture or traumatic subluxation. There is multilevel spondylosis. The right C3-4 facet joint appears ankylosed. As above, bilateral TMJ osteoarthritis. Soft tissues and spinal canal: No prevertebral fluid or swelling. No visible canal hematoma. Disc levels: Chronic multilevel spondylosis with endplate osteophytes and facet hypertrophy contributing to mild foraminal narrowing at multiple levels. No large disc herniation identified. Upper chest: Emphysematous changes at the lung apices. Aortic and great vessel atherosclerosis. Other: None. IMPRESSION: 1. No acute intracranial or calvarial findings. 2. Chronic small vessel ischemic changes in the periventricular white matter. 3. No evidence of acute cervical spine fracture, traumatic subluxation or static signs of instability. 4. Chronic cervicothoracic kyphosis and multilevel  spondylosis. Electronically Signed   By: Richardean Sale M.D.   On: 06/24/2021 16:17   DG Hand 2 View Right  Result Date: 06/24/2021 CLINICAL DATA:  [place], bruising EXAM: RIGHT HAND - 2 VIEW COMPARISON:  03/09/2018 FINDINGS: Frontal and lateral views of the right hand are obtained. Bones are severely osteopenic. There is severe diffuse osteoarthritis, most pronounced within the interphalangeal joints and radial aspect of the carpus. Chronic widening of the scapholunate interval, without significant migration of the capitate. No acute displaced fractures. Mild soft tissue swelling throughout the hand and wrist. IMPRESSION:  1. Severe multifocal osteoarthritis and osteopenia. 2. No acute displaced fracture. 3. Mild diffuse soft tissue swelling. Electronically Signed   By: Randa Ngo M.D.   On: 06/24/2021 21:20   DG Knee Complete 4 Views Right  Result Date: 06/24/2021 CLINICAL DATA:  Right knee pain after fall yesterday. EXAM: RIGHT KNEE - COMPLETE 4+ VIEW COMPARISON:  None. FINDINGS: No evidence of fracture, dislocation, or joint effusion. Moderate narrowing of medial joint space is noted. Chondrocalcinosis is noted laterally with minimal osteophyte formation. Soft tissues are unremarkable. IMPRESSION: Moderate degenerative joint disease.  No acute abnormality seen. Electronically Signed   By: Marijo Conception M.D.   On: 06/24/2021 16:16     LOS: 1 day   Antonieta Pert, MD [facility]  06/25/2021, 8:13 AM

## 2021-06-26 LAB — URINALYSIS, ROUTINE W REFLEX MICROSCOPIC
Bacteria, UA: NONE SEEN
Bilirubin Urine: NEGATIVE
Glucose, UA: NEGATIVE mg/dL
Hgb urine dipstick: NEGATIVE
Ketones, ur: 5 mg/dL — AB
Nitrite: NEGATIVE
Protein, ur: 100 mg/dL — AB
Specific Gravity, Urine: 1.033 — ABNORMAL HIGH (ref 1.005–1.030)
WBC, UA: >50 WBC/hpf — ABNORMAL HIGH (ref 0–5)
pH: 5 (ref 5.0–8.0)

## 2021-06-26 LAB — BASIC METABOLIC PANEL
Anion gap: 7 (ref 5–15)
BUN: 24 mg/dL — ABNORMAL HIGH (ref 8–23)
CO2: 27 mmol/L (ref 22–32)
Calcium: 8.1 mg/dL — ABNORMAL LOW (ref 8.9–10.3)
Chloride: 103 mmol/L (ref 98–111)
Creatinine, Ser: 0.62 mg/dL (ref 0.44–1.00)
GFR, Estimated: >60 mL/min (ref >60)
Glucose, Bld: 136 mg/dL — ABNORMAL HIGH (ref 70–99)
Potassium: 3.7 mmol/L (ref 3.5–5.1)
Sodium: 137 mmol/L (ref 135–145)

## 2021-06-26 LAB — CBC
HCT: 27.9 % — ABNORMAL LOW (ref 36.0–46.0)
Hemoglobin: 8.7 g/dL — ABNORMAL LOW (ref 12.0–15.0)
MCH: 27.5 pg (ref 26.0–34.0)
MCHC: 31.2 g/dL (ref 30.0–36.0)
MCV: 88.3 fL (ref 80.0–100.0)
Platelets: 178 10*3/uL (ref 150–400)
RBC: 3.16 MIL/uL — ABNORMAL LOW (ref 3.87–5.11)
RDW: 18.3 % — ABNORMAL HIGH (ref 11.5–15.5)
WBC: 7.9 10*3/uL (ref 4.0–10.5)
nRBC: 0 % (ref 0.0–0.2)

## 2021-06-26 LAB — PROTIME-INR
INR: 2 — ABNORMAL HIGH (ref 0.8–1.2)
Prothrombin Time: 22.6 seconds — ABNORMAL HIGH (ref 11.4–15.2)

## 2021-06-26 MED ORDER — WARFARIN SODIUM 5 MG PO TABS
5.0000 mg | ORAL_TABLET | Freq: Once | ORAL | Status: AC
  Administered 2021-06-26: 17:00:00 5 mg via ORAL
  Filled 2021-06-26: qty 1

## 2021-06-26 NOTE — [place] FL2 (Signed)
[place] MEDICAID FL2 LEVEL OF CARE SCREENING TOOL     IDENTIFICATION  Patient Name: Casey Patel Birthdate: 03/23/1930 Sex: female Admission Date (Current Location): 06/24/2021  [facility] and [place] Number:  Herbalist and Address:  [facility],  [place] [address] [place]      Provider Number: [id number]  Attending Physician Name and Address:  Antonieta Pert, MD  Relative Name and Phone Number:       Current Level of Care: Hospital Recommended Level of Care: [place] Prior Approval Number:    Date Approved/Denied:   PASRR Number: [id number]  Discharge Plan: SNF    Current Diagnoses: Patient Active Problem List   Diagnosis Date Noted   Acute metabolic encephalopathy 43/32/9518   Atrial fibrillation with RVR ([place]) 06/25/2021   Subtherapeutic international normalized ratio (INR) 06/25/2021   UTI (urinary tract infection) 06/24/2021   Pressure injury of skin 03/08/2018   Atrial fibrillation ([place]) 03/07/2018   Cough 01/01/2012   Pneumonia 12/03/2011   Arthritis    Hypertension    Hyperlipidemia    Asthma    DM (diabetes mellitus) ([place])     Orientation RESPIRATION BLADDER Height & Weight     Self, Time, Situation, Place  O2 (2L) Incontinent Weight: 80 kg Height:  5\' 6"  (167.6 cm)  BEHAVIORAL SYMPTOMS/MOOD NEUROLOGICAL BOWEL NUTRITION STATUS      Incontinent Diet (Regular)  AMBULATORY STATUS COMMUNICATION OF NEEDS Skin   Extensive Assist Verbally Skin abrasions (Skin tear to right hand. Dressing applied.)                       Personal Care Assistance Level of Assistance  Bathing, Feeding, Dressing Bathing Assistance: Limited assistance Feeding assistance: Limited assistance Dressing Assistance: Limited assistance     Functional Limitations Info  Sight, Hearing, Speech Sight Info: Impaired Hearing Info: Impaired Speech Info: Adequate    SPECIAL CARE FACTORS FREQUENCY  PT (By licensed PT), OT (By  licensed OT)     PT Frequency: 5 x weekly OT Frequency: 5 x weekly            Contractures      Additional Factors Info  Code Status, Allergies Code Status Info: DNR Allergies Info: Wheat Bran, Kiwi Extract, Orange Fruit (Citrus), Phenergan (Promethazine), Pineapple, Promethazine Hcl, Tomato, Tylenol (Acetaminophen), Vistaril (Hydroxyzine Hcl), Codeine, Tape           Current Medications (06/26/2021):  This is the current hospital active medication list Current Facility-Administered Medications  Medication Dose Route Frequency Provider Last Rate Last Admin   albuterol (PROVENTIL) (2.5 MG/3ML) 0.083% nebulizer solution 3 mL  3 mL Inhalation Q6H PRN Tu, Ching T, DO       ascorbic acid (VITAMIN C) tablet 1,000 mg  1,000 mg Oral Daily Tu, Ching T, DO   1,000 mg at 06/26/21 0934   bacitracin ointment   Topical BID Lajean Saver, MD   Given at 06/26/21 0947   cefTRIAXone (ROCEPHIN) 1 g in sodium chloride 0.9 % 100 mL IVPB  1 g Intravenous Q24H Tu, Ching T, DO 200 mL/hr at 06/26/21 0946 1 g at 06/26/21 0946   cholecalciferol (VITAMIN D) tablet 2,000 Units  2,000 Units Oral Daily Tu, Ching T, DO   2,000 Units at 06/26/21 0933   fluticasone furoate-vilanterol (BREO ELLIPTA) 100-25 MCG/ACT 1 puff  1 puff Inhalation QHS Tu, Ching T, DO   1 puff at 06/25/21 2131   loratadine (CLARITIN) tablet 10 mg  10 mg Oral Daily Tu, Ching T, DO   10 mg at 06/26/21 0934   metoprolol succinate (TOPROL-XL) 24 hr tablet 150 mg  150 mg Oral Daily Kc, Ramesh, MD   150 mg at 06/26/21 0934   metoprolol tartrate (LOPRESSOR) injection 2.5 mg  2.5 mg Intravenous Q6H PRN Kc, Maren [place], MD   2.5 mg at 06/25/21 1343   multivitamin with minerals tablet 1 tablet  1 tablet Oral Daily Antonieta Pert, MD   1 tablet at 06/26/21 2694   nutrition supplement (JUVEN) (JUVEN) powder packet 1 packet  1 packet Oral BID WC Kc, Ramesh, MD   1 packet at 06/26/21 0847   pantoprazole (PROTONIX) EC tablet 40 mg  40 mg Oral Daily Tu, Ching T, DO    40 mg at 06/26/21 8546   protein supplement (ENSURE MAX) liquid  11 oz Oral BID Kc, Ramesh, MD   11 oz at 06/26/21 0934   traMADol (ULTRAM) tablet 50 mg  50 mg Oral Q6H PRN Tu, Ching T, DO       warfarin (COUMADIN) tablet 5 mg  5 mg Oral ONCE-1600 Kc, Maren Beach, MD       Warfarin - Pharmacist Dosing Inpatient   Does not apply q1600 Suzzanne Cloud, [facility]         Discharge Medications: Please see discharge summary for a list of discharge medications.  Relevant Imaging Results:  Relevant Lab Results:   Additional Information SSN: [id number]  Lisbet Busker, Marjie Skiff, RN

## 2021-06-26 NOTE — Evaluation (Addendum)
Physical Therapy Evaluation Patient Details Name: Casey Patel MRN: [id number] DOB: 06-14-1930 Today's Date: 06/26/2021  History of Present Illness  87 y.o. female with PMH of  asthma, CHF, chronic hypoxemia on 2 L, permanent atrial fibrillation on Coumadin, hypertension, type 2 diabetes, and hyperlipidemia presented after a fall around 2 AM from home where she lives with her sister. Dx of UTI, acute metabolic encephalopathy, afib with RVR.  Clinical Impression  Pt admitted with above diagnosis. +2 mod A for sit to stand from elevated bed and to transfer to recliner with Stedy, as pt was unable to come to full stand with RW.  R knee is painful with movement, she stated she hit her knee when she fell. Pt is able to walk short distances with a RW at baseline. She has had a decline in functional mobility and will likely need ST-SNF upon acute DC. Pt currently with functional limitations due to the deficits listed below (see PT Problem List). Pt will benefit from skilled PT to increase their independence and safety with mobility to allow discharge to the venue listed below.          Recommendations for follow up therapy are one component of a multi-disciplinary discharge planning process, led by the attending physician.  Recommendations may be updated based on patient status, additional functional criteria and insurance authorization.  Follow Up Recommendations Skilled nursing-short term rehab (<3 hours/day)    Assistance Recommended at Discharge Frequent or constant Supervision/Assistance  Functional Status Assessment Patient has had a recent decline in their functional status and demonstrates the ability to make significant improvements in function in a reasonable and predictable amount of time.  Equipment Recommendations  None recommended by PT    Recommendations for Other Services       Precautions / Restrictions Precautions Precautions: Fall Precaution Comments: fall just prior to  admission, denies other falls in past year Restrictions Weight Bearing Restrictions: No      Mobility  Bed Mobility Overal bed mobility: Needs Assistance Bed Mobility: Supine to Sit     Supine to sit: Mod assist     General bed mobility comments: assist to raise trunk and pivot hips to EOB    Transfers Overall transfer level: Needs assistance Equipment used: Rolling walker (2 wheels) Transfers: Sit to/from Stand;Bed to chair/wheelchair/BSC Sit to Stand: Mod assist;+2 physical assistance;From elevated surface           General transfer comment: attempted sit to stand from elevated bed with RW x 3 with max A of 1, pt was unable to clear hips from bed; then switched to [facility], +2 mod A for sit to stand, pt able to briefly (~20 seconds) stand for pericare Transfer via Lift Equipment: Stedy  Ambulation/Gait               General Gait Details: unable  Stairs            Wheelchair Mobility    Modified Rankin (Stroke Patients Only)       Balance Overall balance assessment: Needs assistance;History of Falls Sitting-balance support: Feet supported;No upper extremity supported Sitting balance-Leahy Scale: Fair     Standing balance support: Bilateral upper extremity supported Standing balance-Leahy Scale: Zero                               Pertinent Vitals/Pain Pain Assessment: 0-10 Pain Score: 3  Pain Location: R knee with movement Pain Descriptors / Indicators: Sore  Pain Intervention(s): Limited activity within patient's tolerance;Monitored during session;Repositioned    Home Living Family/patient expects to be discharged to:: Private residence Living Arrangements: Other relatives Available Help at Discharge: Family;Available 24 hours/day   Home Access: Ramped entrance       Home Layout: One level Home Equipment: Conservation officer, nature (2 wheels);Shower seat;Transport chair;BSC/3in1 Additional Comments: lives with sister Vaughan Basta and grandson;  on 2L O2 at night at home    Prior Function Prior Level of Function : Needs assist       Physical Assist : ADLs (physical)     Mobility Comments: walks with RW short distances, uses transport chair to get to dining table at home ADLs Comments: sister assists with bathing     Hand Dominance        Extremity/Trunk Assessment   Upper Extremity Assessment Upper Extremity Assessment: Defer to OT evaluation    Lower Extremity Assessment Lower Extremity Assessment: RLE deficits/detail;LLE deficits/detail RLE Deficits / Details: knee extension -2/5 limited by pain RLE: Unable to fully assess due to pain RLE Sensation: WNL LLE Deficits / Details: knee ext +3/5 LLE Sensation: WNL    Cervical / Trunk Assessment Cervical / Trunk Assessment: Kyphotic;Other exceptions (profound cervical kyphosis, chin rests on chest at baseline, has wound on chest with dressing applied, uses washcloth under chin to elevate head a little, pt unable to actively elevate head) Cervical / Trunk Exceptions: profound cervical kyphosis, chin rests on chest at baseline, has wound on chest with dressing applied, uses washcloth under chin to elevate head a little, pt unable to actively elevate head  Communication   Communication: No difficulties  Cognition Arousal/Alertness: Awake/alert Behavior During Therapy: WFL for tasks assessed/performed Overall Cognitive Status: Within Functional Limits for tasks assessed                                          General Comments      Exercises General Exercises - Lower Extremity Ankle Circles/Pumps: AROM;Both;10 reps;Supine   Assessment/Plan    PT Assessment Patient needs continued PT services  PT Problem List Decreased strength;Decreased mobility;Decreased activity tolerance;Decreased balance;Pain;Decreased skin integrity       PT Treatment Interventions Therapeutic activities;Therapeutic exercise;Gait training;Functional mobility  training;Patient/family education;Balance training    PT Goals (Current goals can be found in the Care Plan section)  Acute Rehab PT Goals Patient Stated Goal: get strong enough to go home PT Goal Formulation: With patient/family Time For Goal Achievement: 07/10/21 Potential to Achieve Goals: Fair    Frequency Min 2X/week   Barriers to discharge        Co-evaluation PT/OT/SLP Co-Evaluation/Treatment: Yes Reason for Co-Treatment: Complexity of the patient's impairments (multi-system involvement);To address functional/ADL transfers PT goals addressed during session: Mobility/safety with mobility;Balance;Proper use of DME;Strengthening/ROM         AM-PAC PT "6 Clicks" Mobility  Outcome Measure Help needed turning from your back to your side while in a flat bed without using bedrails?: A Lot Help needed moving from lying on your back to sitting on the side of a flat bed without using bedrails?: A Lot Help needed moving to and from a bed to a chair (including a wheelchair)?: Total Help needed standing up from a chair using your arms (e.g., wheelchair or bedside chair)?: Total Help needed to walk in hospital room?: Total Help needed climbing 3-5 steps with a railing? : Total 6 Click Score: 8  End of Session Equipment Utilized During Treatment: Gait belt;Oxygen Activity Tolerance: Patient limited by fatigue;Patient limited by pain Patient left: in chair;with call bell/phone within reach;with family/visitor present;with chair alarm set Nurse Communication: Mobility status PT Visit Diagnosis: Difficulty in walking, not elsewhere classified (R26.2);Pain;History of falling (Z91.81);Muscle weakness (generalized) (M62.81) Pain - Right/Left: Right Pain - part of body: Knee    Time: 2353-6144 PT Time Calculation (min) (ACUTE ONLY): 39 min   Charges:   PT Evaluation $PT Eval Moderate Complexity: 1 Mod PT Treatments $Therapeutic Activity: 8-22 mins        Blondell Reveal  [place] PT 06/26/2021  Acute Rehabilitation Services Pager [phone] Office [phone]

## 2021-06-26 NOTE — Progress Notes (Signed)
ANTICOAGULATION CONSULT NOTE - Follow Up Consult  Pharmacy Consult for Warfarin  Indication: atrial fibrillation  Allergies  Allergen Reactions   Wheat Bran Shortness Of Breath   Kiwi Extract Other (See Comments)    Causes mouth sores   Orange Fruit [Citrus] Other (See Comments)    Causes mouth sores   Phenergan [Promethazine]     Other reaction(s): disoriented   Pineapple Other (See Comments)    Causes mouth sores    Promethazine Hcl Other (See Comments)    Disorientation    Tomato Other (See Comments)    Causes mouth sores   Tylenol [Acetaminophen] Other (See Comments)    Causes fast heart rate   Vistaril [Hydroxyzine Hcl] Other (See Comments)    confusion   Codeine Itching and Rash   Tape Other (See Comments) and Rash    Blisters on skin    Patient Measurements: Height: 5\' 6"  (167.6 cm) Weight: 80 kg (176 lb 5.9 oz) IBW/kg (Calculated) : 59.3  Vital Signs: Temp: 98.3 F (36.8 C) (12/29 0505) Temp Source: Oral (12/29 0505) BP: 110/75 (12/29 0505) Pulse Rate: 102 (12/29 0505)  Labs: Recent Labs    06/24/21 1510 06/25/21 0546 06/26/21 0537  HGB 10.0* 9.0* 8.7*  HCT 32.2* 28.4* 27.9*  PLT 217 180 178  LABPROT 19.4* 20.7* 22.6*  INR 1.6* 1.8* 2.0*  CREATININE 0.67  --  0.62     Estimated Creatinine Clearance: 50.9 mL/min (by C-G formula based on SCr of 0.62 mg/dL).   Medications:  Scheduled:   vitamin C  1,000 mg Oral Daily   bacitracin   Topical BID   cholecalciferol  2,000 Units Oral Daily   fluticasone furoate-vilanterol  1 puff Inhalation QHS   loratadine  10 mg Oral Daily   metoprolol succinate  150 mg Oral Daily   multivitamin with minerals  1 tablet Oral Daily   nutrition supplement (JUVEN)  1 packet Oral BID WC   pantoprazole  40 mg Oral Daily   Ensure Max Protein  11 oz Oral BID   Warfarin - Pharmacist Dosing Inpatient   Does not apply q1600   Infusions:   cefTRIAXone (ROCEPHIN)  IV 1 g (06/26/21 0946)   PRN: albuterol, metoprolol  tartrate, traMADol  Assessment: 87 y.o. female with medical history significant for asthma, CHF, chronic hypoxemia on 2 L, permanent atrial fibrillation on Coumadin, hypertension, type 2 diabetes, and hyperlipidemia who presents following a fall. CT/C-spine neg for bleed.     Today, 06/26/2021 INR 2.0, therapeutic Hgb 8.7, decreased slightly, Plts WNL No bleeding reported No drug interactions noted Dysphagia 1 diet ordered  Goal of Therapy:  INR 2-3   Plan:  Warfarin 5mg  PO x 1 tonight at 4P Daily PT/INR Monitor for signs/symptoms of bleeding    Royetta Asal, PharmD, BCPS 06/26/2021 10:18 AM

## 2021-06-26 NOTE — Evaluation (Signed)
Occupational Therapy Evaluation Patient Details Name: Casey Patel MRN: [id number] DOB: [birth year] Today's Date: 06/26/2021   History of Present Illness 87 y.o. female with PMH of  asthma, CHF, chronic hypoxemia on 2 L, permanent atrial fibrillation on Coumadin, hypertension, type 2 diabetes, and hyperlipidemia presented after a fall around 2 AM from home where she lives with her sister. Dx of UTI, acute metabolic encephalopathy, afib with RVR.   Clinical Impression   Ms. Casey Patel is an 87 year old woman who presents with generalized weakness, decreased activity tolerance, impaired balance, decreased ROM and strength of RUE, and joint resulting in a sudden decline in functional abilities. Patient requiring mod x 2 to stand with use of stedy and total assist to transfer to recliner. She is max-total assist for LB ADLs and set up to mod assist for UB ADLs. Patient is limited by severe cervical kyphosis with minimal ability to raise her head off her chest  .Patient typically has assistance of sister for bathing and dressing but can go the bathroom and perform toileting on her own as well as perform grooming and feeding. Patient will benefit from skilled OT services while in hospital to improve deficits and learn compensatory strategies as needed in order to return to PLOF.       Recommendations for follow up therapy are one component of a multi-disciplinary discharge planning process, led by the attending physician.  Recommendations may be updated based on patient status, additional functional criteria and insurance authorization.   Follow Up Recommendations  Skilled nursing-short term rehab (<3 hours/day)    Assistance Recommended at Discharge Frequent or constant Supervision/Assistance  Functional Status Assessment  Patient has had a recent decline in their functional status and demonstrates the ability to make significant improvements in function in a reasonable and predictable amount of time.   Equipment Recommendations   (defer to next venue)    Recommendations for Other Services       Precautions / Restrictions Precautions Precautions: Fall Precaution Comments: fall just prior to admission, denies other falls in past year Restrictions Weight Bearing Restrictions: No      Mobility Bed Mobility Overal bed mobility: Needs Assistance Bed Mobility: Supine to Sit     Supine to sit: Mod assist     General bed mobility comments: assist to raise trunk and pivot hips to EOB    Transfers Overall transfer level: Needs assistance Equipment used: Rolling walker (2 wheels) Transfers: Sit to/from Stand;Bed to chair/wheelchair/BSC Sit to Stand: Mod assist;+2 physical assistance;From elevated surface           General transfer comment: attempted sit to stand from elevated bed with RW x 3 with max A of 1, pt was unable to clear hips from bed; then switched to [facility], +2 mod A for sit to stand, pt able to briefly (~20 seconds) stand for pericare Transfer via Lift Equipment: Stedy    Balance Overall balance assessment: History of Falls Sitting-balance support: Feet supported;No upper extremity supported Sitting balance-Leahy Scale: Fair     Standing balance support: Bilateral upper extremity supported Standing balance-Leahy Scale: Zero                             ADL either performed or assessed with clinical judgement   ADL Overall ADL's : Needs assistance/impaired Eating/Feeding: Set up;Sitting   Grooming: Set up;Sitting   Upper Body Bathing: Moderate assistance;Sitting   Lower Body Bathing: Maximal assistance;Sitting/lateral leans  Upper Body Dressing : Maximal assistance;Sitting   Lower Body Dressing: Total assistance;Sit to/from stand;+2 for physical assistance   Toilet Transfer: Total assistance;+2 for physical assistance   Toileting- Clothing Manipulation and Hygiene: Total assistance;Sit to/from stand;+2 for physical assistance                Vision Baseline Vision/History: 1 Wears glasses Patient Visual Report: No change from baseline       Perception     Praxis      Pertinent Vitals/Pain Pain Assessment: Faces Pain Score: 3  Pain Location: R knee with movement Pain Descriptors / Indicators: Sore Pain Intervention(s): Limited activity within patient's tolerance     Hand Dominance Left   Extremity/Trunk Assessment Upper Extremity Assessment Upper Extremity Assessment: RUE deficits/detail;LUE deficits/detail RUE Deficits / Details: Grossly functional ROM; 3+/5 shoulder strength, 4--/5 elbow, wrist 5/5, grip 3+/5;  skin tear and brushing on dorsum of hand RUE Sensation: WNL RUE Coordination: WNL LUE Deficits / Details: WFL ROM, 4/5 shoulder, 4/5 elbow ,5/5 wrist, grip 4/5 LUE Sensation: WNL LUE Coordination: WNL   Lower Extremity Assessment Lower Extremity Assessment: Defer to PT evaluation RLE Deficits / Details: knee extension -2/5 limited by pain RLE: Unable to fully assess due to pain RLE Sensation: WNL LLE Deficits / Details: knee ext +3/5 LLE Sensation: WNL   Cervical / Trunk Assessment Cervical / Trunk Assessment: Kyphotic;Other exceptions Cervical / Trunk Exceptions: profound cervical kyphosis, chin rests on chest at baseline, has wound on chest with dressing applied, uses washcloth under chin to elevate head a little, pt unable to actively elevate head   Communication Communication Communication: No difficulties   Cognition Arousal/Alertness: Awake/alert Behavior During Therapy: WFL for tasks assessed/performed Overall Cognitive Status: Within Functional Limits for tasks assessed                                       General Comments       Exercises General Exercises - Lower Extremity Ankle Circles/Pumps: AROM;Both;10 reps;Supine   Shoulder Instructions      Home Living Family/patient expects to be discharged to:: Private residence Living Arrangements: Other  relatives Available Help at Discharge: Family;Available 24 hours/day   Home Access: Ramped entrance     Home Layout: One level     Bathroom Shower/Tub: Occupational psychologist: Handicapped height     Home Equipment: Conservation officer, nature (2 wheels);Shower seat;Transport chair;BSC/3in1   Additional Comments: lives with sister Vaughan Basta and grandson; on 2L O2 at night at home      Prior Functioning/Environment Prior Level of Function : Needs assist       Physical Assist : ADLs (physical)     Mobility Comments: walks with RW short distances, uses transport chair to get to dining table at home ADLs Comments: sister assists with bathing        OT Problem List: Decreased strength;Decreased range of motion;Decreased activity tolerance;Impaired balance (sitting and/or standing);Decreased knowledge of use of DME or AE;Pain;Obesity;Impaired UE functional use      OT Treatment/Interventions: Self-care/ADL training;Therapeutic exercise;DME and/or AE instruction;Therapeutic activities;Balance training;Patient/family education    OT Goals(Current goals can be found in the care plan section) Acute Rehab OT Goals Patient Stated Goal: walk to bathroom OT Goal Formulation: With patient Time For Goal Achievement: 07/10/21 Potential to Achieve Goals: Good  OT Frequency: Min 2X/week   Barriers to D/C:  Co-evaluation   Reason for Co-Treatment: For patient/therapist safety;To address functional/ADL transfers;Complexity of the patient's impairments (multi-system involvement) PT goals addressed during session: Mobility/safety with mobility;Balance;Proper use of DME;Strengthening/ROM        AM-PAC OT "6 Clicks" Daily Activity     Outcome Measure Help from another person eating meals?: A Little Help from another person taking care of personal grooming?: A Little Help from another person toileting, which includes using toliet, bedpan, or urinal?: Total Help from another  person bathing (including washing, rinsing, drying)?: A Lot Help from another person to put on and taking off regular upper body clothing?: A Lot Help from another person to put on and taking off regular lower body clothing?: Total 6 Click Score: 12   End of Session Equipment Utilized During Treatment: Gait belt Nurse Communication: Mobility status  Activity Tolerance: Patient tolerated treatment well Patient left: in chair;with call bell/phone within reach;with chair alarm set;with family/visitor present  OT Visit Diagnosis: Other abnormalities of gait and mobility (R26.89);Muscle weakness (generalized) (M62.81);Pain Pain - Right/Left: Right Pain - part of body: Knee                Time: 8182-9937 OT Time Calculation (min): 19 min Charges:  OT General Charges $OT Visit: 1 Visit OT Evaluation $OT Eval Moderate Complexity: 1 Mod  Casey Patel, OTR/L [place]  Office [phone] Pager: [place] 06/26/2021, 11:04 AM

## 2021-06-26 NOTE — TOC Initial Note (Signed)
Transition of Care [facility]) - Initial/Assessment Note    Patient Details  Name: Casey Patel MRN: [id number] Date of Birth: March 13, 1930  Transition of Care [facility]) CM/SW Contact:    Lynnell Catalan, RN Phone Number: 06/26/2021, 2:32 PM  Clinical Narrative:                 Spoke with pt and sister at bedside for dc planning. Physical therapy recommendations discussed. Pt agrees to short term SNF. FL2 faxed out to area facilities. TOC will provide SNF bed offers when available.   Expected Discharge Plan: Skilled Nursing Facility Barriers to Discharge: Continued Medical Work up   Patient Goals and CMS Choice Patient states their goals for this hospitalization and ongoing recovery are:: To get stronger      Expected Discharge Plan and Services Expected Discharge Plan: [place]   Discharge Planning Services: CM Consult   Living arrangements for the past 2 months: Single Family Home                                      Prior Living Arrangements/Services Living arrangements for the past 2 months: Single Family Home Lives with:: Siblings Patient language and need for interpreter reviewed:: Yes        Need for Family Participation in Patient Care: Yes (Comment) Care giver support system in place?: Yes (comment)   Criminal Activity/Legal Involvement Pertinent to Current Situation/Hospitalization: No - Comment as needed  Activities of Daily Living Home Assistive Devices/Equipment: Eyeglasses, Environmental consultant (specify type), Bedside commode/3-in-1 (glasses for near and far) ADL Screening (condition at time of admission) Patient's cognitive ability adequate to safely complete daily activities?: No Is the patient deaf or have difficulty hearing?: No Does the patient have difficulty seeing, even when wearing glasses/contacts?: Yes Does the patient have difficulty concentrating, remembering, or making decisions?: Yes Patient able to express need for assistance with ADLs?:  Yes Does the patient have difficulty dressing or bathing?: Yes Independently performs ADLs?: No Communication: Independent Dressing (OT): Needs assistance Is this a change from baseline?: Pre-admission baseline Grooming: Needs assistance Is this a change from baseline?: Pre-admission baseline Feeding: Independent Bathing: Needs assistance Is this a change from baseline?: Pre-admission baseline Toileting: Needs assistance Is this a change from baseline?: Pre-admission baseline In/Out Bed: Needs assistance Is this a change from baseline?: Pre-admission baseline Walks in Home: Dependent Is this a change from baseline?: Pre-admission baseline Does the patient have difficulty walking or climbing stairs?: Yes Weakness of Legs: Both Weakness of Arms/Hands: Both  Permission Sought/Granted   Permission granted to share information with : Yes, Verbal Permission Granted              Emotional Assessment Appearance:: Appears stated age Attitude/Demeanor/Rapport: Gracious Affect (typically observed): Calm Orientation: : Oriented to Self, Oriented to Place, Oriented to  Time, Oriented to Situation Alcohol / Substance Use: Not Applicable Psych Involvement: No (comment)  Admission diagnosis:  Confusion [R41.0] UTI (urinary tract infection) [N39.0] Fall [W19.XXXA] Acute UTI [N39.0] Generalized weakness [R53.1] Atrial fibrillation with RVR ([place]) [I48.91] Fall from slip, trip, or stumble, initial encounter [W01.0XXA] Altered mental status, unspecified altered mental status type [R41.82] Patient Active Problem List   Diagnosis Date Noted   Acute metabolic encephalopathy 67/61/9509   Atrial fibrillation with RVR ([place]) 06/25/2021   Subtherapeutic international normalized ratio (INR) 06/25/2021   UTI (urinary tract infection) 06/24/2021   Pressure injury of skin 03/08/2018  Atrial fibrillation ([place]) 03/07/2018   Cough 01/01/2012   Pneumonia 12/03/2011   Arthritis    Hypertension     Hyperlipidemia    Asthma    DM (diabetes mellitus) ([place])    PCP:  [name], MD Pharmacy:   [id number] - [place], [address] [place] [address] Phone: [phone] Fax: [phone]     Social Determinants of Health (SDOH) Interventions    Readmission Risk Interventions Readmission Risk Prevention Plan 06/26/2021  Transportation Screening Complete  PCP or Specialist Appt within 5-7 Days Complete  Home Care Screening Complete  Medication Review (RN CM) Complete  Some recent data might be hidden

## 2021-06-26 NOTE — Progress Notes (Signed)
PROGRESS NOTE    Casey Patel  WFU:[id number] DOB: Nov 06, 1929 DOA: 06/24/2021 PCP: [name], MD   Chief Complaint  Patient presents with   Fall   Altered Mental Status    Brief Narrative/Hospital Course:  Casey Patel, 87 y.o. female with PMH of  asthma, CHF, chronic hypoxemia on 2 L, permanent atrial fibrillation on Coumadin, hypertension, type 2 diabetes, and hyperlipidemia presented after a fall around 2 AM from home where she lives with her sister.  Sister found her on the ground away from her walker in the bedroom and had to get help from her grandson patient had no recollection of the event and found to have significant skin tear on her right hand EMS was called brought to the ED In the ED was in A. fib with RVR rate 06/29/2008-120 blood pressure stable, on 2 L nasal cannula from home WBC count 10.2 hemoglobin 10 stable renal function CT head, CT C-spine, x-ray of the right hand and wrist knees were done no acute fracture but severe degenerative changes and soft tissue swelling on the hand UA was positive for nitrate large leukocytes, suspected to have UTI and admitted on antibiotics   Subjective: Seen and examined this morning.  About to work with physical therapy Afebrile overnight.  Labs showed a stable CBC INR now therapeutic Abrasion in the right hand in dressing with some oozing,  Assessment & Plan:  Urinary tract infection POA: Urine culture with mixed organisms , reordered UA/urine culture-UA WBC more than 50, negative nitrite, large LE. Continue on IV ceftriaxone .   Fall at home: Imaging with CT head CT C-spine x-rays of the knees right hand negative fracture but severe degenerative changes on hand. suspect fall in the setting of deconditioning UTI. Continue PT OT. She Lives with her Sister.  Acute metabolic encephalopathy due to UTI CT head no acute finding mental status has improved.  A. fib with RVR status post Cardizem IV transitioned to home metoprolol on  home Coumadin -heart rate is poorly controlled Metropol changed to daily dosing needed IV metoprolol as well.  INR subtherapeutic - pharmacy managing. Recent Labs  Lab 06/24/21 1510 06/25/21 0546 06/26/21 0537  INR 1.6* 1.8* 2.0*    Pressure ulcer at multiple sites POA, see below  Right upper skin abrasion status post fall.  Continue wound care  Severe kyphosis of the cervical spine- SLP eval appreciated continue regular solid diet,   DVT prophylaxis: Place and maintain sequential compression device Start: 06/25/21 1231Coumadin Code Status:   Code Status: DNR Family Communication: plan of care discussed with patient at bedside. Status is: Inpatient Remains inpatient appropriate because: Ongoing management UTI, deconditioning and debility Disposition: Currently not medically stable for discharge. Anticipated Disposition: TBD.  Pending PT OT eval  Objective: Vitals last 24 hrs: Vitals:   06/25/21 1759 06/25/21 2211 06/26/21 0125 06/26/21 0505  BP: 103/67 117/75 (!) 109/55 110/75  Pulse: (!) 54 (!) 103 (!) 103 (!) 102  Resp: 16 16 14 18   Temp: 98.4 F (36.9 C) 98.2 F (36.8 C) 99.1 F (37.3 C) 98.3 F (36.8 C)  TempSrc: Oral Oral Oral Oral  SpO2: 96% 97% 96% 95%  Weight:      Height:       Weight change:   Intake/Output Summary (Last 24 hours) at 06/26/2021 1042 Last data filed at 06/26/2021 0742 Gross per 24 hour  Intake 100 ml  Output 400 ml  Net -300 ml   Net IO Since Admission: 33.39 mL [  06/26/21 1042]   Physical Examination: General exam:alert awake oriented, obese, pleasant,  elderly,weak appearing. HEENT:Oral mucosa moist, Ear/Nose WNL grossly, dentition normal. Respiratory system: bilaterally clear, no use of accessory muscle Cardiovascular system: S1 & S2 +, No JVD,. Gastrointestinal system: Abdomen soft, NT,ND, BS+ Nervous System:Alert, awake, moving extremities and grossly nonfocal Extremities: no edema, distal peripheral pulses palpable.  Skin: No  rashes,no icterus.  Skin tear on right hand, abrasion on the chest from chin rubbing MSK: Normal muscle bulk,tone, power   Medications reviewed:  Scheduled Meds:  vitamin C  1,000 mg Oral Daily   bacitracin   Topical BID   cholecalciferol  2,000 Units Oral Daily   fluticasone furoate-vilanterol  1 puff Inhalation QHS   loratadine  10 mg Oral Daily   metoprolol succinate  150 mg Oral Daily   multivitamin with minerals  1 tablet Oral Daily   nutrition supplement (JUVEN)  1 packet Oral BID WC   pantoprazole  40 mg Oral Daily   Ensure Max Protein  11 oz Oral BID   warfarin  5 mg Oral ONCE-1600   Warfarin - Pharmacist Dosing Inpatient   Does not apply q1600   Continuous Infusions:  cefTRIAXone (ROCEPHIN)  IV 1 g (06/26/21 0946)   Pressure Injury 03/07/18 Stage II -  Partial thickness loss of dermis presenting as a shallow open ulcer with a red, pink wound bed without slough. (Active)  03/07/18 2130  Location: Buttocks  Location Orientation: Medial  Staging: Stage II -  Partial thickness loss of dermis presenting as a shallow open ulcer with a red, pink wound bed without slough.  Wound Description (Comments):   Present on Admission: Yes     Pressure Injury 03/07/18 Stage I -  Intact skin with non-blanchable redness of a localized area usually over a bony prominence. (Active)  03/07/18 2130  Location: Buttocks  Location Orientation: Mid  Staging: Stage I -  Intact skin with non-blanchable redness of a localized area usually over a bony prominence.  Wound Description (Comments):   Present on Admission: Yes     Pressure Injury 06/24/21 Sternum Upper;Mid Stage 3 -  Full thickness tissue loss. Subcutaneous fat may be visible but bone, tendon or muscle are NOT exposed. (Active)  06/24/21   Location: Sternum  Location Orientation: Upper;Mid  Staging: Stage 3 -  Full thickness tissue loss. Subcutaneous fat may be visible but bone, tendon or muscle are NOT exposed.  Wound Description  (Comments):   Present on Admission: Yes     Pressure Injury 06/24/21 Jaw Mid Stage 2 -  Partial thickness loss of dermis presenting as a shallow open injury with a red, pink wound bed without slough. (Active)  06/24/21   Location: Jaw  Location Orientation: Mid  Staging: Stage 2 -  Partial thickness loss of dermis presenting as a shallow open injury with a red, pink wound bed without slough.  Wound Description (Comments):   Present on Admission: Yes     Pressure Injury 06/24/21 Heel Left;Right Stage 1 -  Intact skin with non-blanchable redness of a localized area usually over a bony prominence. (Active)  06/24/21   Location: Heel  Location Orientation: Left;Right  Staging: Stage 1 -  Intact skin with non-blanchable redness of a localized area usually over a bony prominence.  Wound Description (Comments):   Present on Admission: Yes    Diet Order             Diet regular Room service appropriate? Yes; Fluid consistency:  Thin  Diet effective now                  Weight change:   Wt Readings from Last 3 Encounters:  06/25/21 80 kg  08/07/20 80.9 kg  08/08/19 84.5 kg     Consultants:see note  Procedures:see note Antimicrobials: Anti-infectives (From admission, onward)    Start     Dose/Rate Route Frequency Ordered Stop   06/25/21 1000  cefTRIAXone (ROCEPHIN) 1 g in sodium chloride 0.9 % 100 mL IVPB        1 g 200 mL/hr over 30 Minutes Intravenous Every 24 hours 06/24/21 1936     06/24/21 1730  cefTRIAXone (ROCEPHIN) 1 g in sodium chloride 0.9 % 100 mL IVPB        1 g 200 mL/hr over 30 Minutes Intravenous  Once 06/24/21 1720 06/24/21 1820      Culture/Microbiology    Component Value Date/Time   SDES  06/24/2021 1530    URINE, CATHETERIZED Performed at [facility], [place] [address], [place], [place] [address]    SPECREQUEST  06/24/2021 1530    NONE Performed at [facility], [place] [address], [place], [place] [address]     CULT MULTIPLE SPECIES PRESENT, SUGGEST RECOLLECTION (A) 06/24/2021 1530   REPTSTATUS 06/25/2021 FINAL 06/24/2021 1530    Other culture-see note  Unresulted Labs (From admission, onward)     Start     Ordered   06/26/21 0715  Urine Culture  ONCE - STAT,   STAT       Question:  Indication  Answer:  Dysuria   06/26/21 0714   06/26/21 5102  Basic metabolic panel  Daily,   R     Question:  Specimen collection method  Answer:  Lab=Lab collect   06/25/21 0957   06/26/21 0500  CBC  Daily,   R     Question:  Specimen collection method  Answer:  Lab=Lab collect   06/25/21 0957   06/25/21 0500  Protime-INR  Daily,   R      06/24/21 1958          Data Reviewed: I have personally reviewed following labs and imaging studies CBC: Recent Labs  Lab 06/24/21 1510 06/25/21 0546 06/26/21 0537  WBC 10.2 7.5 7.9  HGB 10.0* 9.0* 8.7*  HCT 32.2* 28.4* 27.9*  MCV 87.5 86.6 88.3  PLT 217 180 585   Basic Metabolic Panel: Recent Labs  Lab 06/24/21 1510 06/26/21 0537  NA 139 137  K 4.9 3.7  CL 103 103  CO2 28 27  GLUCOSE 122* 136*  BUN 24* 24*  CREATININE 0.67 0.62  CALCIUM 7.8* 8.1*   GFR: Estimated Creatinine Clearance: 50.9 mL/min (by C-G formula based on SCr of 0.62 mg/dL). Liver Function Tests: Recent Labs  Lab 06/24/21 1510  AST 31  ALT 15  ALKPHOS 47  BILITOT 1.7*  PROT 6.5  ALBUMIN 3.2*   No results for input(s): LIPASE, AMYLASE in the last 168 hours. No results for input(s): AMMONIA in the last 168 hours. Coagulation Profile: Recent Labs  Lab 06/24/21 1510 06/25/21 0546 06/26/21 0537  INR 1.6* 1.8* 2.0*   Cardiac Enzymes: No results for input(s): CKTOTAL, CKMB, CKMBINDEX, TROPONINI in the last 168 hours. BNP (last 3 results) No results for input(s): PROBNP in the last 8760 hours. HbA1C: No results for input(s): HGBA1C in the last 72 hours. CBG: Recent Labs  Lab 06/24/21 2049  GLUCAP 127*   Lipid Profile:  No results for input(s): CHOL, HDL, LDLCALC,  TRIG, CHOLHDL, LDLDIRECT in the last 72 hours. Thyroid Function Tests: No results for input(s): TSH, T4TOTAL, FREET4, T3FREE, THYROIDAB in the last 72 hours. Anemia Panel: No results for input(s): VITAMINB12, FOLATE, FERRITIN, TIBC, IRON, RETICCTPCT in the last 72 hours. Sepsis Labs: No results for input(s): PROCALCITON, LATICACIDVEN in the last 168 hours.  Recent Results (from the past 240 hour(s))  Resp Panel by RT-PCR (Flu A&B, Covid) Nasopharyngeal Swab     Status: None   Collection Time: 06/24/21  3:14 PM   Specimen: Nasopharyngeal Swab; Nasopharyngeal(NP) swabs in vial transport medium  Result Value Ref Range Status   SARS Coronavirus 2 by RT PCR NEGATIVE NEGATIVE Final    Comment: (NOTE) SARS-CoV-2 target nucleic acids are NOT DETECTED.  The SARS-CoV-2 RNA is generally detectable in upper respiratory specimens during the acute phase of infection. The lowest concentration of SARS-CoV-2 viral copies this assay can detect is 138 copies/mL. A negative result does not preclude SARS-Cov-2 infection and should not be used as the sole basis for treatment or other patient management decisions. A negative result may occur with  improper specimen collection/handling, submission of specimen other than nasopharyngeal swab, presence of viral mutation(s) within the areas targeted by this assay, and inadequate number of viral copies(<138 copies/mL). A negative result must be combined with clinical observations, patient history, and epidemiological information. The expected result is Negative.  Fact Sheet for Patients:  EntrepreneurPulse.com.au  Fact Sheet for Healthcare Providers:  IncredibleEmployment.be  This test is no t yet approved or cleared by the [place] FDA and  has been authorized for detection and/or diagnosis of SARS-CoV-2 by FDA under an Emergency Use Authorization (EUA). This EUA will remain  in effect (meaning this test can be  used) for the duration of the COVID-19 declaration under Section 564(b)(1) of the Act, 21 U.S.C.section 360bbb-3(b)(1), unless the authorization is terminated  or revoked sooner.       Influenza A by PCR NEGATIVE NEGATIVE Final   Influenza B by PCR NEGATIVE NEGATIVE Final    Comment: (NOTE) The Xpert Xpress SARS-CoV-2/FLU/RSV plus assay is intended as an aid in the diagnosis of influenza from Nasopharyngeal swab specimens and should not be used as a sole basis for treatment. Nasal washings and aspirates are unacceptable for Xpert Xpress SARS-CoV-2/FLU/RSV testing.  Fact Sheet for Patients: EntrepreneurPulse.com.au  Fact Sheet for Healthcare Providers: IncredibleEmployment.be  This test is not yet approved or cleared by the [place] FDA and has been authorized for detection and/or diagnosis of SARS-CoV-2 by FDA under an Emergency Use Authorization (EUA). This EUA will remain in effect (meaning this test can be used) for the duration of the COVID-19 declaration under Section 564(b)(1) of the Act, 21 U.S.C. section 360bbb-3(b)(1), unless the authorization is terminated or revoked.  Performed at [facility], [place] [address] [place], [place] [address]   Urine Culture     Status: Abnormal   Collection Time: 06/24/21  3:30 PM   Specimen: Urine, Catheterized  Result Value Ref Range Status   Specimen Description   Final    URINE, CATHETERIZED Performed at [place] [address] [place], [place] [address]    Special Requests   Final    NONE Performed at [facility], [place] [address] [place], [place] [address]    Culture MULTIPLE SPECIES PRESENT, SUGGEST RECOLLECTION (A)  Final   Report Status 06/25/2021 FINAL  Final     Radiology  Studies: DG Wrist Complete Right  Result Date: 06/24/2021 CLINICAL DATA:  Right wrist pain after fall yesterday. EXAM: RIGHT WRIST - COMPLETE  3+ VIEW COMPARISON:  March 09, 2018. FINDINGS: There is no evidence of fracture or dislocation. Severe degenerative changes seen involving the radiocarpal joint as well as the first metacarpophalangeal joint. Soft tissues are unremarkable. IMPRESSION: Severe degenerative changes as described above. No acute abnormality seen. Electronically Signed   By: Marijo Conception M.D.   On: 06/24/2021 16:14   CT HEAD WO CONTRAST (5MM)  Result Date: 06/24/2021 CLINICAL DATA:  Head trauma, minor. Increased falls with weakness and confusion. EXAM: CT HEAD WITHOUT CONTRAST CT CERVICAL SPINE WITHOUT CONTRAST TECHNIQUE: Multidetector CT imaging of the head and cervical spine was performed following the standard protocol without intravenous contrast. Multiplanar CT image reconstructions of the cervical spine were also generated. COMPARISON:  Prior CTs 07/16/2011 FINDINGS: CT HEAD FINDINGS Due to extreme cervicothoracic kyphosis, the images were acquired in a direct nearly coronal plane. Plane of imaging affects image quality. Brain: There is no evidence of acute intracranial hemorrhage, mass lesion, brain edema or extra-axial fluid collection. The ventricles and subarachnoid spaces are appropriately sized for age. Low-density in the periventricular white matter appear similar to previous study, likely due to chronic small vessel ischemic changes. No evidence of acute cortical based infarct. Vascular: Prominent intracranial vascular calcifications. No hyperdense vessel identified. Skull: Negative for fracture or focal lesion. Calvarial hyperostosis noted. Sinuses/Orbits: The visualized paranasal sinuses and mastoid air cells are clear. No orbital abnormalities are seen. Other: Advanced TMJ degenerative changes bilaterally. CT CERVICAL SPINE FINDINGS Alignment: Exaggerated cervicothoracic kyphosis without evidence of focal angulation or progressive listhesis. Skull base and vertebrae: No evidence of acute cervical spine  fracture or traumatic subluxation. There is multilevel spondylosis. The right C3-4 facet joint appears ankylosed. As above, bilateral TMJ osteoarthritis. Soft tissues and spinal canal: No prevertebral fluid or swelling. No visible canal hematoma. Disc levels: Chronic multilevel spondylosis with endplate osteophytes and facet hypertrophy contributing to mild foraminal narrowing at multiple levels. No large disc herniation identified. Upper chest: Emphysematous changes at the lung apices. Aortic and great vessel atherosclerosis. Other: None. IMPRESSION: 1. No acute intracranial or calvarial findings. 2. Chronic small vessel ischemic changes in the periventricular white matter. 3. No evidence of acute cervical spine fracture, traumatic subluxation or static signs of instability. 4. Chronic cervicothoracic kyphosis and multilevel spondylosis. Electronically Signed   By: Richardean Sale M.D.   On: 06/24/2021 16:17   CT CERVICAL SPINE WO CONTRAST  Result Date: 06/24/2021 CLINICAL DATA:  Head trauma, minor. Increased falls with weakness and confusion. EXAM: CT HEAD WITHOUT CONTRAST CT CERVICAL SPINE WITHOUT CONTRAST TECHNIQUE: Multidetector CT imaging of the head and cervical spine was performed following the standard protocol without intravenous contrast. Multiplanar CT image reconstructions of the cervical spine were also generated. COMPARISON:  Prior CTs 07/16/2011 FINDINGS: CT HEAD FINDINGS Due to extreme cervicothoracic kyphosis, the images were acquired in a direct nearly coronal plane. Plane of imaging affects image quality. Brain: There is no evidence of acute intracranial hemorrhage, mass lesion, brain edema or extra-axial fluid collection. The ventricles and subarachnoid spaces are appropriately sized for age. Low-density in the periventricular white matter appear similar to previous study, likely due to chronic small vessel ischemic changes. No evidence of acute cortical based infarct. Vascular: Prominent  intracranial vascular calcifications. No hyperdense vessel identified. Skull: Negative for fracture or focal lesion. Calvarial hyperostosis noted. Sinuses/Orbits: The visualized paranasal sinuses and mastoid  air cells are clear. No orbital abnormalities are seen. Other: Advanced TMJ degenerative changes bilaterally. CT CERVICAL SPINE FINDINGS Alignment: Exaggerated cervicothoracic kyphosis without evidence of focal angulation or progressive listhesis. Skull base and vertebrae: No evidence of acute cervical spine fracture or traumatic subluxation. There is multilevel spondylosis. The right C3-4 facet joint appears ankylosed. As above, bilateral TMJ osteoarthritis. Soft tissues and spinal canal: No prevertebral fluid or swelling. No visible canal hematoma. Disc levels: Chronic multilevel spondylosis with endplate osteophytes and facet hypertrophy contributing to mild foraminal narrowing at multiple levels. No large disc herniation identified. Upper chest: Emphysematous changes at the lung apices. Aortic and great vessel atherosclerosis. Other: None. IMPRESSION: 1. No acute intracranial or calvarial findings. 2. Chronic small vessel ischemic changes in the periventricular white matter. 3. No evidence of acute cervical spine fracture, traumatic subluxation or static signs of instability. 4. Chronic cervicothoracic kyphosis and multilevel spondylosis. Electronically Signed   By: Richardean Sale M.D.   On: 06/24/2021 16:17   DG Hand 2 View Right  Result Date: 06/24/2021 CLINICAL DATA:  [place], bruising EXAM: RIGHT HAND - 2 VIEW COMPARISON:  03/09/2018 FINDINGS: Frontal and lateral views of the right hand are obtained. Bones are severely osteopenic. There is severe diffuse osteoarthritis, most pronounced within the interphalangeal joints and radial aspect of the carpus. Chronic widening of the scapholunate interval, without significant migration of the capitate. No acute displaced fractures. Mild soft tissue swelling  throughout the hand and wrist. IMPRESSION: 1. Severe multifocal osteoarthritis and osteopenia. 2. No acute displaced fracture. 3. Mild diffuse soft tissue swelling. Electronically Signed   By: Randa Ngo M.D.   On: 06/24/2021 21:20   DG Knee Complete 4 Views Right  Result Date: 06/24/2021 CLINICAL DATA:  Right knee pain after fall yesterday. EXAM: RIGHT KNEE - COMPLETE 4+ VIEW COMPARISON:  None. FINDINGS: No evidence of fracture, dislocation, or joint effusion. Moderate narrowing of medial joint space is noted. Chondrocalcinosis is noted laterally with minimal osteophyte formation. Soft tissues are unremarkable. IMPRESSION: Moderate degenerative joint disease.  No acute abnormality seen. Electronically Signed   By: Marijo Conception M.D.   On: 06/24/2021 16:16     LOS: 2 days   Antonieta Pert, MD [facility]  06/26/2021, 10:42 AM

## 2021-06-27 LAB — URINE CULTURE: Culture: <10000 — AB

## 2021-06-27 LAB — BASIC METABOLIC PANEL
Anion gap: 5 (ref 5–15)
BUN: 31 mg/dL — ABNORMAL HIGH (ref 8–23)
CO2: 29 mmol/L (ref 22–32)
Calcium: 8.1 mg/dL — ABNORMAL LOW (ref 8.9–10.3)
Chloride: 100 mmol/L (ref 98–111)
Creatinine, Ser: 0.57 mg/dL (ref 0.44–1.00)
GFR, Estimated: >60 mL/min (ref >60)
Glucose, Bld: 138 mg/dL — ABNORMAL HIGH (ref 70–99)
Potassium: 3.9 mmol/L (ref 3.5–5.1)
Sodium: 134 mmol/L — ABNORMAL LOW (ref 135–145)

## 2021-06-27 LAB — CBC
HCT: 27.6 % — ABNORMAL LOW (ref 36.0–46.0)
Hemoglobin: 8.5 g/dL — ABNORMAL LOW (ref 12.0–15.0)
MCH: 27 pg (ref 26.0–34.0)
MCHC: 30.8 g/dL (ref 30.0–36.0)
MCV: 87.6 fL (ref 80.0–100.0)
Platelets: 190 10*3/uL (ref 150–400)
RBC: 3.15 MIL/uL — ABNORMAL LOW (ref 3.87–5.11)
RDW: 18.3 % — ABNORMAL HIGH (ref 11.5–15.5)
WBC: 7.4 10*3/uL (ref 4.0–10.5)
nRBC: 0 % (ref 0.0–0.2)

## 2021-06-27 LAB — PROTIME-INR
INR: 2.1 — ABNORMAL HIGH (ref 0.8–1.2)
Prothrombin Time: 23.3 seconds — ABNORMAL HIGH (ref 11.4–15.2)

## 2021-06-27 LAB — SARS CORONAVIRUS 2 (TAT 6-24 HRS): SARS Coronavirus 2: NEGATIVE

## 2021-06-27 MED ORDER — ADULT MULTIVITAMIN W/MINERALS CH: 1.0000 | ORAL_TABLET | Freq: Every day | ORAL | Status: DC

## 2021-06-27 MED ORDER — CEPHALEXIN 500 MG PO CAPS: 500.0000 mg | ORAL_CAPSULE | Freq: Four times a day (QID) | ORAL | 0 refills | Status: DC

## 2021-06-27 MED ORDER — WARFARIN SODIUM 2.5 MG PO TABS
2.5000 mg | ORAL_TABLET | Freq: Once | ORAL | Status: AC
  Administered 2021-06-27: 18:00:00 2.5 mg via ORAL
  Filled 2021-06-27: qty 1

## 2021-06-27 MED ORDER — ADULT MULTIVITAMIN W/MINERALS CH: 1.0000 | ORAL_TABLET | Freq: Every day | ORAL | 0 refills | Status: AC

## 2021-06-27 MED ORDER — JUVEN PO PACK: 1.0000 | PACK | Freq: Two times a day (BID) | ORAL | 0 refills | Status: AC

## 2021-06-27 MED ORDER — TRAMADOL HCL 50 MG PO TABS
50.0000 mg | ORAL_TABLET | Freq: Four times a day (QID) | ORAL | 0 refills | Status: DC | PRN
Start: 2021-06-27 — End: 2021-07-08

## 2021-06-27 MED ORDER — BACITRACIN ZINC 500 UNIT/GM EX OINT
TOPICAL_OINTMENT | Freq: Two times a day (BID) | CUTANEOUS | 0 refills | Status: DC
Start: 2021-06-27 — End: 2021-07-08

## 2021-06-27 MED ORDER — BACITRACIN ZINC 500 UNIT/GM EX OINT
TOPICAL_OINTMENT | Freq: Two times a day (BID) | CUTANEOUS | 0 refills | Status: DC
Start: 2021-06-27 — End: 2021-06-27

## 2021-06-27 NOTE — Discharge Summary (Signed)
Physician Discharge Summary  Casey Patel DUK:[id number] DOB: 12-Nov-1929 DOA: 06/24/2021  PCP: Casey Pao, MD  Admit date: 06/24/2021 Discharge date: 06/27/2021  Admitted From: home Disposition:  SNF  Recommendations for Outpatient Follow-up:  Follow up with PCP in 1-2 weeks Please obtain BMP/CBC in one week  Home Health:NO  Equipment/Devices: NONE  Discharge Condition: Stable Code Status:   Code Status: DNR Diet recommendation:  Diet Order             Diet regular Room service appropriate? Yes; Fluid consistency: Thin  Diet effective now                    Brief/Interim Summary:   87 y.o. female with PMH of  asthma, CHF, chronic hypoxemia on 2 L, permanent atrial fibrillation on Coumadin, hypertension, type 2 diabetes, and hyperlipidemia presented after a fall around 2 AM from home where she lives with her sister.  Sister found her on the ground away from her walker in the bedroom and had to get help from her grandson patient had no recollection of the event and found to have significant skin tear on her right hand EMS was called brought to the ED In the ED was in A. fib with RVR rate 06/29/2008-120 blood pressure stable, on 2 L nasal cannula from home WBC count 10.2 hemoglobin 10 stable renal function CT head, CT C-spine, x-ray of the right hand and wrist knees were done no acute fracture but severe degenerative changes and soft tissue swelling on the hand UA was positive for nitrate large leukocytes, suspected to have UTI and admitted on antibiotics. Patient clinically improved but remains deconditioned weak seen by PT OT and advised skilled nursing facility.  Repeat UA is abnormal but clear urine culture less than 10,000 GROWTH At this time she is medically stable for discharge to facility once available  Discharge Diagnoses:   Urinary tract infection POA: Urine culture with mixed organisms , reordered UA/urine culture-UA WBC more than 50, negative nitrite, large  LE-urine culture less than 10,000 colonies.  Change to oral Keflex upon discharge.   Fall at home: Imaging with CT head CT C-spine x-rays of the knees right hand negative fracture but severe degenerative changes on hand. suspect fall in the setting of deconditioning UTI. Continue PT OT. She Lives with her Sister.  Looking into skilled nursing facility   Acute metabolic encephalopathy due to UTI CT head no acute finding mental status has improved.   A. fib with RVR status post Cardizem IV transitioned to home metoprolol on home Coumadin -HR rate is well controlled now continue home metoprolol, INR is therapeutic.   Pressure ulcer at multiple sites POA, see below   Right upper skin abrasion status post fall.  Continue wound care.  She is on antibiotics as above per #1   Severe kyphosis of the cervical spine- SLP eval appreciated continue regular solid diet,  Pressure Ulcer: Pressure Injury 06/24/21 Sternum Upper;Mid Stage 3 -  Full thickness tissue loss. Subcutaneous fat may be visible but bone, tendon or muscle are NOT exposed. (Active)  06/24/21   Location: Sternum  Location Orientation: Upper;Mid  Staging: Stage 3 -  Full thickness tissue loss. Subcutaneous fat may be visible but bone, tendon or muscle are NOT exposed.  Wound Description (Comments):   Present on Admission: Yes     Pressure Injury 06/24/21 Jaw Mid Stage 2 -  Partial thickness loss of dermis presenting as a shallow open injury with a red,  pink wound bed without slough. (Active)  06/24/21   Location: Jaw  Location Orientation: Mid  Staging: Stage 2 -  Partial thickness loss of dermis presenting as a shallow open injury with a red, pink wound bed without slough.  Wound Description (Comments):   Present on Admission: Yes     Pressure Injury 06/24/21 Heel Left;Right Stage 1 -  Intact skin with non-blanchable redness of a localized area usually over a bony prominence. (Active)  06/24/21   Location: Heel  Location  Orientation: Left;Right  Staging: Stage 1 -  Intact skin with non-blanchable redness of a localized area usually over a bony prominence.  Wound Description (Comments):   Present on Admission: Yes    Consults: toc  Subjective: Alert awake resting comfortably no new complaints. Discharge Exam: Vitals:   06/26/21 2133 06/27/21 0506  BP: (!) 113/59 (!) 107/55  Pulse: (!) 107 (!) 101  Resp: 16 18  Temp: 97.6 F (36.4 C) 99.3 F (37.4 C)  SpO2: 97% 93%   General: Pt is alert, awake, not in acute distress Cardiovascular: RRR, S1/S2 +, no rubs, no gallops Respiratory: CTA bilaterally, no wheezing, no rhonchi Abdominal: Soft, NT, ND, bowel sounds + Extremities: no edema, no cyanosis Skin tear abrasion on the chest and the right hand  Discharge Instructions  Discharge Instructions     Discharge instructions   Complete by: As directed    Please call call MD or return to ER for similar or worsening recurring problem that brought you to hospital or if any fever,nausea/vomiting,abdominal pain, uncontrolled pain, chest pain,  shortness of breath or any other alarming symptoms.  Please follow-up your doctor as instructed in a week time and call the office for appointment.  Please avoid alcohol, smoking, or any other illicit substance and maintain healthy habits including taking your regular medications as prescribed.  You were cared for by a hospitalist during your hospital stay. If you have any questions about your discharge medications or the care you received while you were in the hospital after you are discharged, you can call the unit and ask to speak with the hospitalist on call if the hospitalist that took care of you is not available.  Once you are discharged, your primary care physician will handle any further medical issues. Please note that NO REFILLS for any discharge medications will be authorized once you are discharged, as it is imperative that you return to your primary  care physician (or establish a relationship with a primary care physician if you do not have one) for your aftercare needs so that they can reassess your need for medications and monitor your lab values   Discharge wound care:   Complete by: As directed    Please clean skin tear to right wrist, re-appoximate skin as best as able - then apply thin coat of bacitracin and sterile, non-adherent dressing  Dry dressing to the ulcer on the chest.   Increase activity slowly   Complete by: As directed       Allergies as of 06/27/2021       Reactions   Wheat Bran Shortness Of Breath   Kiwi Extract Other (See Comments)   Causes mouth sores   Orange Fruit [citrus] Other (See Comments)   Causes mouth sores   Phenergan [promethazine]    Other reaction(s): disoriented   Pineapple Other (See Comments)   Causes mouth sores   Promethazine Hcl Other (See Comments)   Disorientation   Tomato Other (See Comments)  Causes mouth sores   Tylenol [acetaminophen] Other (See Comments)   Causes fast heart rate   Vistaril [hydroxyzine Hcl] Other (See Comments)   confusion   Codeine Itching, Rash   Tape Other (See Comments), Rash   Blisters on skin        Medication List     TAKE these medications    albuterol 108 (90 Base) MCG/ACT inhaler Commonly known as: VENTOLIN HFA Inhale 2 puffs into the lungs every 6 (six) hours as needed for wheezing or shortness of breath.   bacitracin ointment Apply topically 2 (two) times daily for 7 days. Apply to right wrist   cephALEXin 500 MG capsule Commonly known as: KEFLEX Take 1 capsule (500 mg total) by mouth 4 (four) times daily for 3 days.   docusate sodium 100 MG capsule Commonly known as: COLACE Take 1 capsule (100 mg total) by mouth daily as needed for mild constipation. What changed: when to take this   fluticasone furoate-vilanterol 100-25 MCG/INH Aepb Commonly known as: BREO ELLIPTA Inhale 1 puff into the lungs at bedtime.   Glucerna  Advance Shake Liqd Take 1 Bottle by mouth daily as needed (nutritional suppliment). What changed: Another medication with the same name was added. Make sure you understand how and when to take each.   nutrition supplement (JUVEN) Pack Take 1 packet by mouth 2 (two) times daily with a meal. What changed: You were already taking a medication with the same name, and this prescription was added. Make sure you understand how and when to take each.   loratadine 10 MG tablet Commonly known as: CLARITIN Take 10 mg by mouth daily.   metFORMIN 500 MG tablet Commonly known as: GLUCOPHAGE Take 500 mg by mouth 2 (two) times daily with a meal.   methocarbamol 750 MG tablet Commonly known as: ROBAXIN Take 750 mg by mouth 3 (three) times daily.   metoprolol succinate 100 MG 24 hr tablet Commonly known as: TOPROL-XL Take 150 mg by mouth at bedtime.   multivitamin with minerals Tabs tablet Take 1 tablet by mouth daily. Start taking on: June 28, 2021   omeprazole 20 MG capsule Commonly known as: PRILOSEC Take 20 mg by mouth daily.   traMADol 50 MG tablet Commonly known as: ULTRAM Take 1 tablet (50 mg total) by mouth every 6 (six) hours as needed for up to 4 doses for moderate pain (pain).   vitamin C 1000 MG tablet Take 1,000 mg by mouth daily.   Vitamin D 50 MCG (2000 UT) Caps Take 2,000 Units by mouth daily.   warfarin 5 MG tablet Commonly known as: COUMADIN Take 2.5 mg by mouth See admin instructions. Take 2.5 tablet daily except Monday and Friday               Discharge Care Instructions  (From admission, onward)           Start     Ordered   06/27/21 0000  Discharge wound care:       Comments: Please clean skin tear to right wrist, re-appoximate skin as best as able - then apply thin coat of bacitracin and sterile, non-adherent dressing  Dry dressing to the ulcer on the chest.   06/27/21 1117            Follow-up Information     Tisovec, Fransico Him, MD  Follow up in 1 week(s).   Specialty: Internal Medicine Contact information: [address] [place] [address] [phone]  [place], Peter M, MD .   Specialty: Cardiology Contact information: [address] [place] [address] [phone]                Allergies  Allergen Reactions   Wheat Bran Shortness Of Breath   Kiwi Extract Other (See Comments)    Causes mouth sores   Orange Fruit [Citrus] Other (See Comments)    Causes mouth sores   Phenergan [Promethazine]     Other reaction(s): disoriented   Pineapple Other (See Comments)    Causes mouth sores    Promethazine Hcl Other (See Comments)    Disorientation    Tomato Other (See Comments)    Causes mouth sores   Tylenol [Acetaminophen] Other (See Comments)    Causes fast heart rate   Vistaril [Hydroxyzine Hcl] Other (See Comments)    confusion   Codeine Itching and Rash   Tape Other (See Comments) and Rash    Blisters on skin    The results of significant diagnostics from this hospitalization (including imaging, microbiology, ancillary and laboratory) are listed below for reference.    Microbiology: Recent Results (from the past 240 hour(s))  Resp Panel by RT-PCR (Flu A&B, Covid) Nasopharyngeal Swab     Status: None   Collection Time: 06/24/21  3:14 PM   Specimen: Nasopharyngeal Swab; Nasopharyngeal(NP) swabs in vial transport medium  Result Value Ref Range Status   SARS Coronavirus 2 by RT PCR NEGATIVE NEGATIVE Final    Comment: (NOTE) SARS-CoV-2 target nucleic acids are NOT DETECTED.  The SARS-CoV-2 RNA is generally detectable in upper respiratory specimens during the acute phase of infection. The lowest concentration of SARS-CoV-2 viral copies this assay can detect is 138 copies/mL. A negative result does not preclude SARS-Cov-2 infection and should not be used as the sole basis for treatment or other patient management decisions. A negative result may occur with   improper specimen collection/handling, submission of specimen other than nasopharyngeal swab, presence of viral mutation(s) within the areas targeted by this assay, and inadequate number of viral copies(<138 copies/mL). A negative result must be combined with clinical observations, patient history, and epidemiological information. The expected result is Negative.  Fact Sheet for Patients:  EntrepreneurPulse.com.au  Fact Sheet for Healthcare Providers:  IncredibleEmployment.be  This test is no t yet approved or cleared by the [place] FDA and  has been authorized for detection and/or diagnosis of SARS-CoV-2 by FDA under an Emergency Use Authorization (EUA). This EUA will remain  in effect (meaning this test can be used) for the duration of the COVID-19 declaration under Section 564(b)(1) of the Act, 21 U.S.C.section 360bbb-3(b)(1), unless the authorization is terminated  or revoked sooner.       Influenza A by PCR NEGATIVE NEGATIVE Final   Influenza B by PCR NEGATIVE NEGATIVE Final    Comment: (NOTE) The Xpert Xpress SARS-CoV-2/FLU/RSV plus assay is intended as an aid in the diagnosis of influenza from Nasopharyngeal swab specimens and should not be used as a sole basis for treatment. Nasal washings and aspirates are unacceptable for Xpert Xpress SARS-CoV-2/FLU/RSV testing.  Fact Sheet for Patients: EntrepreneurPulse.com.au  Fact Sheet for Healthcare Providers: IncredibleEmployment.be  This test is not yet approved or cleared by the [place] FDA and has been authorized for detection and/or diagnosis of SARS-CoV-2 by FDA under an Emergency Use Authorization (EUA). This EUA will remain in effect (meaning this test can be used) for the duration of the COVID-19 declaration under Section 564(b)(1) of the Act,  21 U.S.C. section 360bbb-3(b)(1), unless the authorization is terminated  or revoked.  Performed at [facility], [place] [address], [place], [place] [address]   Urine Culture     Status: Abnormal   Collection Time: 06/24/21  3:30 PM   Specimen: Urine, Catheterized  Result Value Ref Range Status   Specimen Description   Final    URINE, CATHETERIZED Performed at [place] [address] [place], [place] [address]    Special Requests   Final    NONE Performed at [facility], [place] [address], [place], [place] [address]    Culture MULTIPLE SPECIES PRESENT, SUGGEST RECOLLECTION (A)  Final   Report Status 06/25/2021 FINAL  Final  Urine Culture     Status: Abnormal   Collection Time: 06/26/21  8:50 AM   Specimen: Urine, Clean Catch  Result Value Ref Range Status   Specimen Description   Final    URINE, CLEAN CATCH Performed at [facility], [place] [address], [place], [place] [address]    Special Requests   Final    NONE Performed at [facility], [place] [address] [place], [place] [address]    Culture (A)  Final    <10,000 COLONIES/mL INSIGNIFICANT GROWTH Performed at [place] [address] [place], [place] [address]    Report Status 06/27/2021 FINAL  Final    Procedures/Studies: DG Wrist Complete Right  Result Date: 06/24/2021 CLINICAL DATA:  Right wrist pain after fall yesterday. EXAM: RIGHT WRIST - COMPLETE 3+ VIEW COMPARISON:  March 09, 2018. FINDINGS: There is no evidence of fracture or dislocation. Severe degenerative changes seen involving the radiocarpal joint as well as the first metacarpophalangeal joint. Soft tissues are unremarkable. IMPRESSION: Severe degenerative changes as described above. No acute abnormality seen. Electronically Signed   By: Marijo Conception M.D.   On: 06/24/2021 16:14   CT HEAD WO CONTRAST (5MM)  Result Date: 06/24/2021 CLINICAL DATA:  Head trauma, minor. Increased falls with weakness and confusion. EXAM: CT  HEAD WITHOUT CONTRAST CT CERVICAL SPINE WITHOUT CONTRAST TECHNIQUE: Multidetector CT imaging of the head and cervical spine was performed following the standard protocol without intravenous contrast. Multiplanar CT image reconstructions of the cervical spine were also generated. COMPARISON:  Prior CTs 07/16/2011 FINDINGS: CT HEAD FINDINGS Due to extreme cervicothoracic kyphosis, the images were acquired in a direct nearly coronal plane. Plane of imaging affects image quality. Brain: There is no evidence of acute intracranial hemorrhage, mass lesion, brain edema or extra-axial fluid collection. The ventricles and subarachnoid spaces are appropriately sized for age. Low-density in the periventricular white matter appear similar to previous study, likely due to chronic small vessel ischemic changes. No evidence of acute cortical based infarct. Vascular: Prominent intracranial vascular calcifications. No hyperdense vessel identified. Skull: Negative for fracture or focal lesion. Calvarial hyperostosis noted. Sinuses/Orbits: The visualized paranasal sinuses and mastoid air cells are clear. No orbital abnormalities are seen. Other: Advanced TMJ degenerative changes bilaterally. CT CERVICAL SPINE FINDINGS Alignment: Exaggerated cervicothoracic kyphosis without evidence of focal angulation or progressive listhesis. Skull base and vertebrae: No evidence of acute cervical spine fracture or traumatic subluxation. There is multilevel spondylosis. The right C3-4 facet joint appears ankylosed. As above, bilateral TMJ osteoarthritis. Soft tissues and spinal canal: No prevertebral fluid or swelling. No visible canal hematoma. Disc levels: Chronic multilevel spondylosis with endplate osteophytes and facet hypertrophy contributing to mild foraminal narrowing at multiple levels. No large disc herniation identified.  Upper chest: Emphysematous changes at the lung apices. Aortic and great vessel atherosclerosis. Other: None. IMPRESSION:  1. No acute intracranial or calvarial findings. 2. Chronic small vessel ischemic changes in the periventricular white matter. 3. No evidence of acute cervical spine fracture, traumatic subluxation or static signs of instability. 4. Chronic cervicothoracic kyphosis and multilevel spondylosis. Electronically Signed   By: Richardean Sale M.D.   On: 06/24/2021 16:17   CT CERVICAL SPINE WO CONTRAST  Result Date: 06/24/2021 CLINICAL DATA:  Head trauma, minor. Increased falls with weakness and confusion. EXAM: CT HEAD WITHOUT CONTRAST CT CERVICAL SPINE WITHOUT CONTRAST TECHNIQUE: Multidetector CT imaging of the head and cervical spine was performed following the standard protocol without intravenous contrast. Multiplanar CT image reconstructions of the cervical spine were also generated. COMPARISON:  Prior CTs 07/16/2011 FINDINGS: CT HEAD FINDINGS Due to extreme cervicothoracic kyphosis, the images were acquired in a direct nearly coronal plane. Plane of imaging affects image quality. Brain: There is no evidence of acute intracranial hemorrhage, mass lesion, brain edema or extra-axial fluid collection. The ventricles and subarachnoid spaces are appropriately sized for age. Low-density in the periventricular white matter appear similar to previous study, likely due to chronic small vessel ischemic changes. No evidence of acute cortical based infarct. Vascular: Prominent intracranial vascular calcifications. No hyperdense vessel identified. Skull: Negative for fracture or focal lesion. Calvarial hyperostosis noted. Sinuses/Orbits: The visualized paranasal sinuses and mastoid air cells are clear. No orbital abnormalities are seen. Other: Advanced TMJ degenerative changes bilaterally. CT CERVICAL SPINE FINDINGS Alignment: Exaggerated cervicothoracic kyphosis without evidence of focal angulation or progressive listhesis. Skull base and vertebrae: No evidence of acute cervical spine fracture or traumatic subluxation. There  is multilevel spondylosis. The right C3-4 facet joint appears ankylosed. As above, bilateral TMJ osteoarthritis. Soft tissues and spinal canal: No prevertebral fluid or swelling. No visible canal hematoma. Disc levels: Chronic multilevel spondylosis with endplate osteophytes and facet hypertrophy contributing to mild foraminal narrowing at multiple levels. No large disc herniation identified. Upper chest: Emphysematous changes at the lung apices. Aortic and great vessel atherosclerosis. Other: None. IMPRESSION: 1. No acute intracranial or calvarial findings. 2. Chronic small vessel ischemic changes in the periventricular white matter. 3. No evidence of acute cervical spine fracture, traumatic subluxation or static signs of instability. 4. Chronic cervicothoracic kyphosis and multilevel spondylosis. Electronically Signed   By: Richardean Sale M.D.   On: 06/24/2021 16:17   DG Hand 2 View Right  Result Date: 06/24/2021 CLINICAL DATA:  [place], bruising EXAM: RIGHT HAND - 2 VIEW COMPARISON:  03/09/2018 FINDINGS: Frontal and lateral views of the right hand are obtained. Bones are severely osteopenic. There is severe diffuse osteoarthritis, most pronounced within the interphalangeal joints and radial aspect of the carpus. Chronic widening of the scapholunate interval, without significant migration of the capitate. No acute displaced fractures. Mild soft tissue swelling throughout the hand and wrist. IMPRESSION: 1. Severe multifocal osteoarthritis and osteopenia. 2. No acute displaced fracture. 3. Mild diffuse soft tissue swelling. Electronically Signed   By: Randa Ngo M.D.   On: 06/24/2021 21:20   DG Knee Complete 4 Views Right  Result Date: 06/24/2021 CLINICAL DATA:  Right knee pain after fall yesterday. EXAM: RIGHT KNEE - COMPLETE 4+ VIEW COMPARISON:  None. FINDINGS: No evidence of fracture, dislocation, or joint effusion. Moderate narrowing of medial joint space is noted. Chondrocalcinosis is noted  laterally with minimal osteophyte formation. Soft tissues are unremarkable. IMPRESSION: Moderate degenerative joint disease.  No acute abnormality seen. Electronically Signed  By: Marijo Conception M.D.   On: 06/24/2021 16:16    Labs: BNP (last 3 results) No results for input(s): BNP in the last 8760 hours. Basic Metabolic Panel: Recent Labs  Lab 06/24/21 1510 06/26/21 0537 06/27/21 0557  NA 139 137 134*  K 4.9 3.7 3.9  CL 103 103 100  CO2 28 27 29   GLUCOSE 122* 136* 138*  BUN 24* 24* 31*  CREATININE 0.67 0.62 0.57  CALCIUM 7.8* 8.1* 8.1*   Liver Function Tests: Recent Labs  Lab 06/24/21 1510  AST 31  ALT 15  ALKPHOS 47  BILITOT 1.7*  PROT 6.5  ALBUMIN 3.2*   No results for input(s): LIPASE, AMYLASE in the last 168 hours. No results for input(s): AMMONIA in the last 168 hours. CBC: Recent Labs  Lab 06/24/21 1510 06/25/21 0546 06/26/21 0537 06/27/21 0557  WBC 10.2 7.5 7.9 7.4  HGB 10.0* 9.0* 8.7* 8.5*  HCT 32.2* 28.4* 27.9* 27.6*  MCV 87.5 86.6 88.3 87.6  PLT 217 180 178 190   Cardiac Enzymes: No results for input(s): CKTOTAL, CKMB, CKMBINDEX, TROPONINI in the last 168 hours. BNP: Invalid input(s): POCBNP CBG: Recent Labs  Lab 06/24/21 2049  GLUCAP 127*   D-Dimer No results for input(s): DDIMER in the last 72 hours. Hgb A1c No results for input(s): HGBA1C in the last 72 hours. Lipid Profile No results for input(s): CHOL, HDL, LDLCALC, TRIG, CHOLHDL, LDLDIRECT in the last 72 hours. Thyroid function studies No results for input(s): TSH, T4TOTAL, T3FREE, THYROIDAB in the last 72 hours.  Invalid input(s): FREET3 Anemia work up No results for input(s): VITAMINB12, FOLATE, FERRITIN, TIBC, IRON, RETICCTPCT in the last 72 hours. Urinalysis    Component Value Date/Time   COLORURINE AMBER (A) 06/26/2021 0850   APPEARANCEUR CLOUDY (A) 06/26/2021 0850   LABSPEC 1.033 (H) 06/26/2021 0850   PHURINE 5.0 06/26/2021 0850   GLUCOSEU NEGATIVE 06/26/2021 0850    HGBUR NEGATIVE 06/26/2021 0850   BILIRUBINUR NEGATIVE 06/26/2021 0850   KETONESUR 5 (A) 06/26/2021 0850   PROTEINUR 100 (A) 06/26/2021 0850   NITRITE NEGATIVE 06/26/2021 0850   LEUKOCYTESUR LARGE (A) 06/26/2021 0850   Sepsis Labs Invalid input(s): PROCALCITONIN,  WBC,  LACTICIDVEN Microbiology Recent Results (from the past 240 hour(s))  Resp Panel by RT-PCR (Flu A&B, Covid) Nasopharyngeal Swab     Status: None   Collection Time: 06/24/21  3:14 PM   Specimen: Nasopharyngeal Swab; Nasopharyngeal(NP) swabs in vial transport medium  Result Value Ref Range Status   SARS Coronavirus 2 by RT PCR NEGATIVE NEGATIVE Final    Comment: (NOTE) SARS-CoV-2 target nucleic acids are NOT DETECTED.  The SARS-CoV-2 RNA is generally detectable in upper respiratory specimens during the acute phase of infection. The lowest concentration of SARS-CoV-2 viral copies this assay can detect is 138 copies/mL. A negative result does not preclude SARS-Cov-2 infection and should not be used as the sole basis for treatment or other patient management decisions. A negative result may occur with  improper specimen collection/handling, submission of specimen other than nasopharyngeal swab, presence of viral mutation(s) within the areas targeted by this assay, and inadequate number of viral copies(<138 copies/mL). A negative result must be combined with clinical observations, patient history, and epidemiological information. The expected result is Negative.  Fact Sheet for Patients:  EntrepreneurPulse.com.au  Fact Sheet for Healthcare Providers:  IncredibleEmployment.be  This test is no t yet approved or cleared by the [place] FDA and  has been authorized for detection and/or diagnosis of  SARS-CoV-2 by FDA under an Emergency Use Authorization (EUA). This EUA will remain  in effect (meaning this test can be used) for the duration of the COVID-19 declaration under Section  564(b)(1) of the Act, 21 U.S.C.section 360bbb-3(b)(1), unless the authorization is terminated  or revoked sooner.       Influenza A by PCR NEGATIVE NEGATIVE Final   Influenza B by PCR NEGATIVE NEGATIVE Final    Comment: (NOTE) The Xpert Xpress SARS-CoV-2/FLU/RSV plus assay is intended as an aid in the diagnosis of influenza from Nasopharyngeal swab specimens and should not be used as a sole basis for treatment. Nasal washings and aspirates are unacceptable for Xpert Xpress SARS-CoV-2/FLU/RSV testing.  Fact Sheet for Patients: EntrepreneurPulse.com.au  Fact Sheet for Healthcare Providers: IncredibleEmployment.be  This test is not yet approved or cleared by the [place] FDA and has been authorized for detection and/or diagnosis of SARS-CoV-2 by FDA under an Emergency Use Authorization (EUA). This EUA will remain in effect (meaning this test can be used) for the duration of the COVID-19 declaration under Section 564(b)(1) of the Act, 21 U.S.C. section 360bbb-3(b)(1), unless the authorization is terminated or revoked.  Performed at [facility], [place] [address] [place], [place] [address]   Urine Culture     Status: Abnormal   Collection Time: 06/24/21  3:30 PM   Specimen: Urine, Catheterized  Result Value Ref Range Status   Specimen Description   Final    URINE, CATHETERIZED Performed at [place] [address], [place], [place] [address]    Special Requests   Final    NONE Performed at [facility], [place] [address], [place], [place] [address]    Culture MULTIPLE SPECIES PRESENT, SUGGEST RECOLLECTION (A)  Final   Report Status 06/25/2021 FINAL  Final  Urine Culture     Status: Abnormal   Collection Time: 06/26/21  8:50 AM   Specimen: Urine, Clean Catch  Result Value Ref Range Status   Specimen Description   Final    URINE, CLEAN CATCH Performed at [facility], [place] [address] [place], [place] [address]    Special Requests   Final    NONE Performed at [facility], [place] [address] [place], [place] [address]    Culture (A)  Final    <10,000 COLONIES/mL INSIGNIFICANT GROWTH Performed at [place] [address] [place], [place] [address]    Report Status 06/27/2021 FINAL  Final     Time coordinating discharge: 35 minutes  SIGNED: Antonieta Pert, MD  [facility] 06/27/2021, 11:17 AM  If 7PM-7AM, please contact night-coverage [email or web address]

## 2021-06-27 NOTE — Progress Notes (Signed)
ANTICOAGULATION CONSULT NOTE - Follow Up Consult  Pharmacy Consult for Warfarin  Indication: atrial fibrillation  Allergies  Allergen Reactions   Wheat Bran Shortness Of Breath   Kiwi Extract Other (See Comments)    Causes mouth sores   Orange Fruit [Citrus] Other (See Comments)    Causes mouth sores   Phenergan [Promethazine]     Other reaction(s): disoriented   Pineapple Other (See Comments)    Causes mouth sores    Promethazine Hcl Other (See Comments)    Disorientation    Tomato Other (See Comments)    Causes mouth sores   Tylenol [Acetaminophen] Other (See Comments)    Causes fast heart rate   Vistaril [Hydroxyzine Hcl] Other (See Comments)    confusion   Codeine Itching and Rash   Tape Other (See Comments) and Rash    Blisters on skin    Patient Measurements: Height: 5\' 6"  (167.6 cm) Weight: 80 kg (176 lb 5.9 oz) IBW/kg (Calculated) : 59.3  Vital Signs: Temp: 99.3 F (37.4 C) (12/30 0506) Temp Source: Oral (12/30 0506) BP: 107/55 (12/30 0506) Pulse Rate: 101 (12/30 0506)  Labs: Recent Labs    06/24/21 1510 06/25/21 0546 06/26/21 0537 06/27/21 0557  HGB 10.0* 9.0* 8.7* 8.5*  HCT 32.2* 28.4* 27.9* 27.6*  PLT 217 180 178 190  LABPROT 19.4* 20.7* 22.6* 23.3*  INR 1.6* 1.8* 2.0* 2.1*  CREATININE 0.67  --  0.62 0.57     Estimated Creatinine Clearance: 50.9 mL/min (by C-G formula based on SCr of 0.57 mg/dL).   Medications:  Scheduled:   vitamin C  1,000 mg Oral Daily   bacitracin   Topical BID   cholecalciferol  2,000 Units Oral Daily   fluticasone furoate-vilanterol  1 puff Inhalation QHS   loratadine  10 mg Oral Daily   metoprolol succinate  150 mg Oral Daily   multivitamin with minerals  1 tablet Oral Daily   nutrition supplement (JUVEN)  1 packet Oral BID WC   pantoprazole  40 mg Oral Daily   Ensure Max Protein  11 oz Oral BID   Warfarin - Pharmacist Dosing Inpatient   Does not apply q1600   Infusions:   cefTRIAXone (ROCEPHIN)  IV 1 g  (06/26/21 0946)   PRN: albuterol, metoprolol tartrate, traMADol  Assessment: 87 y.o. female with medical history significant for asthma, CHF, chronic hypoxemia on 2 L, permanent atrial fibrillation on Coumadin, hypertension, type 2 diabetes, and hyperlipidemia who presents following a fall. CT/C-spine neg for bleed.     Today, 06/27/2021 INR 2.1, therapeutic Hgb 8.5, decreased slightly, Plts WNL No bleeding reported No drug interactions noted Dysphagia 1 diet ordered  Goal of Therapy:  INR 2-3   Plan:  Warfarin 2.5mg  PO x 1 tonight at 4P Daily PT/INR Monitor for signs/symptoms of bleeding    Royetta Asal, PharmD, BCPS 06/27/2021 10:07 AM

## 2021-06-27 NOTE — Discharge Instructions (Signed)
1. Please clean skin tear to right wrist, re-appoximate skin as best as able - then apply thin coat of bacitracin and sterile, non-adherent dressing twice a BID 2. Foam dressing to chest wound, change Q 3 days or PRN soiling.

## 2021-06-27 NOTE — Consult Note (Addendum)
WOC Nurse Consult Note: Reason for Consult: Consult requested for skin tear to right arm and Stage 3 pressure injury to anterior chest.  Performed remotely after review of progress notes and discussed wound appearance with bedside nurse via phone call. Topical treatment orders have already been provided by the physician for the skin tear.  Pt has chronic kyphosis and her head and neck are constantly causing pressure to the anterior chest related to limited mobility and body habitus.  She has a red moist Stage 3 pressure injury where the anatomy is constantly rubbing together.  Pressure Injury POA: Yes, this was noted as present on admission on the nursing wound care flow sheet Dressing procedure/placement/frequency: Topical treatment orders provided for bedside nurses to perform as follows: 1. Please clean skin tear to right wrist, re-appoximate skin as best as able - then apply thin coat of bacitracin and sterile, non-adherent dressing BID 2. Foam dressing to chest wound, change Q 3 days or PRN soiling. Please re-consult if further assistance is needed.  Thank-you,  Julien Girt MSN, [place], [place], [place], [place]

## 2021-06-27 NOTE — TOC Transition Note (Signed)
Transition of Care [facility]) - CM/SW Discharge Note   Patient Details  Name: Casey Patel MRN: [id number] Date of Birth: June 23, 1930  [facility] CM/SW Contact:  Lynnell Catalan, RN Phone Number: 06/27/2021, 2:43 PM   Clinical Narrative:    Pt and sister provided with SNF bed offers. Pt has decided that she is not happy with any of the facility choices and wants to go back home. She was active with [facility] for home health services. [place] liaison contacted to resume services at [facility]. MD orders for [facility] received. Sister Vaughan Basta is asking for a hospital bed for pt at home. Hospital bed ordered from [place]. Per Andee Poles at [facility] the hospital bed will be delivered to pt home at 4:30pm today. [facility] was requested for transport home. [facility] called and scheduled for 6pm pick up. Yellow DNR on the chart for dc.    Final next level of care: [place] Barriers to Discharge: No Barriers Identified   Patient Goals and CMS Choice Patient states their goals for this hospitalization and ongoing recovery are:: To get stronger CMS Medicare.gov Compare Post Acute Care list provided to:: Patient Choice offered to / list presented to : Patient   Discharge Plan and Services   Discharge Planning Services: CM Consult            DME Arranged: Hospital bed DME Agency: [facility] Date DME Agency Contacted: 06/27/21 Time DME Agency Contacted: 61 Representative spoke with at DME Agency: Andee Poles HH Arranged: RN, PT, OT [facility] Agency: [place] Date [place]: 06/27/21 Time [place]: 1330 Representative spoke with at [place]: [place] ([place]) Interventions     Readmission Risk Interventions Readmission Risk Prevention Plan 06/26/2021  Transportation Screening Complete  PCP or Specialist Appt within 5-7 Days Complete  Home Care Screening Complete  Medication Review (RN CM) Complete  Some recent data might be  hidden

## 2021-06-28 LAB — PROTIME-INR
INR: 2.1 — ABNORMAL HIGH (ref 0.8–1.2)
Prothrombin Time: 23.8 seconds — ABNORMAL HIGH (ref 11.4–15.2)

## 2021-06-28 MED ORDER — CEPHALEXIN 500 MG PO CAPS
500.0000 mg | ORAL_CAPSULE | Freq: Four times a day (QID) | ORAL | Status: DC
  Administered 2021-06-28: 500 mg via ORAL
  Filled 2021-06-28: qty 1

## 2021-06-28 MED ORDER — WARFARIN SODIUM 2.5 MG PO TABS
2.5000 mg | ORAL_TABLET | Freq: Once | ORAL | Status: AC
  Administered 2021-06-28: 2.5 mg via ORAL
  Filled 2021-06-28: qty 1

## 2021-06-28 NOTE — Progress Notes (Signed)
[facility] transportation colt not be set up so was not discharged yesterday, transport set up this morning is being discharged.  Seen this morning alert awake oriented stable would like to go home with sister, has no complaint.  She is completing antibiotic today.

## 2021-06-28 NOTE — Progress Notes (Signed)
Pt discharged home via [facility] in stable condition. Discharge instructions given to sister Vaughan Basta who verbalized understanding. Scripts sent to pharmacy of choice. Vaughan Basta called and notified of [facility] pick up

## 2021-06-28 NOTE — Progress Notes (Addendum)
ANTICOAGULATION CONSULT NOTE - Follow Up Consult  Pharmacy Consult for Warfarin  Indication: atrial fibrillation  Allergies  Allergen Reactions   Wheat Bran Shortness Of Breath   Kiwi Extract Other (See Comments)    Causes mouth sores   Orange Fruit [Citrus] Other (See Comments)    Causes mouth sores   Phenergan [Promethazine]     Other reaction(s): disoriented   Pineapple Other (See Comments)    Causes mouth sores    Promethazine Hcl Other (See Comments)    Disorientation    Tomato Other (See Comments)    Causes mouth sores   Tylenol [Acetaminophen] Other (See Comments)    Causes fast heart rate   Vistaril [Hydroxyzine Hcl] Other (See Comments)    confusion   Codeine Itching and Rash   Tape Other (See Comments) and Rash    Blisters on skin    Patient Measurements: Height: 5\' 6"  (167.6 cm) Weight: 80 kg (176 lb 5.9 oz) IBW/kg (Calculated) : 59.3  Vital Signs: Temp: 98.1 F (36.7 C) (12/31 0654) Temp Source: Oral (12/31 0654) BP: 101/67 (12/31 0654) Pulse Rate: 51 (12/31 0654)  Labs: Recent Labs    06/26/21 0537 06/27/21 0557 06/28/21 0642  HGB 8.7* 8.5*  --   HCT 27.9* 27.6*  --   PLT 178 190  --   LABPROT 22.6* 23.3* 23.8*  INR 2.0* 2.1* 2.1*  CREATININE 0.62 0.57  --      Estimated Creatinine Clearance: 50.9 mL/min (by C-G formula based on SCr of 0.57 mg/dL).   Medications:  Scheduled:   vitamin C  1,000 mg Oral Daily   bacitracin   Topical BID   cholecalciferol  2,000 Units Oral Daily   fluticasone furoate-vilanterol  1 puff Inhalation QHS   loratadine  10 mg Oral Daily   metoprolol succinate  150 mg Oral Daily   multivitamin with minerals  1 tablet Oral Daily   nutrition supplement (JUVEN)  1 packet Oral BID WC   pantoprazole  40 mg Oral Daily   Ensure Max Protein  11 oz Oral BID   Warfarin - Pharmacist Dosing Inpatient   Does not apply q1600   Infusions:   cefTRIAXone (ROCEPHIN)  IV Stopped (06/27/21 1110)   PRN: albuterol, metoprolol  tartrate, traMADol  Assessment: 87 y.o. female with medical history significant for asthma, CHF, chronic hypoxemia on 2 L, permanent atrial fibrillation on Coumadin, hypertension, type 2 diabetes, and hyperlipidemia who presents following a fall. CT/C-spine neg for bleed.  PTA Warfarin regimen: 2.5mg  PO daily except for 5mg  on Mondays and Fridays.   Today, 06/28/2021 INR 2.1, therapeutic 12/30 labs: Hgb 8.5, decreased slightly, Plts WNL No bleeding reported No drug interactions noted Dysphagia 1 diet ordered- eating 100% of meals  Goal of Therapy:  INR 2-3   Plan:  Warfarin 2.5mg  PO x 1 (will give now so patient receives prior to discharge today) Daily PT/INR Monitor for signs/symptoms of bleeding Based on INR trend up to this point, would resume PTA regimen at discharge  Dimple Nanas, PharmD 06/28/2021 9:25 AM

## 2021-06-30 ENCOUNTER — Other Ambulatory Visit: Payer: Self-pay

## 2021-06-30 ENCOUNTER — Emergency Department (HOSPITAL_COMMUNITY)
Admission: EM | Admit: 2021-06-30 | Discharge: 2021-07-01 | Disposition: A | Discharge status: Home / Self Care | Payer: Medicare Other | Source: Home / Self Care | Attending: Emergency Medicine | Admitting: Emergency Medicine

## 2021-06-30 ENCOUNTER — Telehealth: Payer: Self-pay | Admitting: Nurse Practitioner

## 2021-06-30 ENCOUNTER — Other Ambulatory Visit (HOSPITAL_COMMUNITY)
Admission: AD | Admit: 2021-06-30 | Discharge: 2021-06-30 | Disposition: A | Discharge status: Home / Self Care | Payer: Medicare Other | Source: Skilled Nursing Facility | Attending: *Deleted | Admitting: *Deleted

## 2021-06-30 ENCOUNTER — Encounter (HOSPITAL_COMMUNITY): Payer: Self-pay

## 2021-06-30 ENCOUNTER — Emergency Department (HOSPITAL_COMMUNITY): Payer: Medicare Other

## 2021-06-30 DIAGNOSIS — Z7401 Bed confinement status: Secondary | ICD-10-CM | POA: Diagnosis not present

## 2021-06-30 DIAGNOSIS — Z7189 Other specified counseling: Secondary | ICD-10-CM | POA: Diagnosis not present

## 2021-06-30 DIAGNOSIS — R296 Repeated falls: Secondary | ICD-10-CM | POA: Diagnosis present

## 2021-06-30 DIAGNOSIS — I10 Essential (primary) hypertension: Secondary | ICD-10-CM | POA: Diagnosis not present

## 2021-06-30 DIAGNOSIS — R000 Tachycardia, unspecified: Secondary | ICD-10-CM | POA: Diagnosis not present

## 2021-06-30 DIAGNOSIS — R0902 Hypoxemia: Secondary | ICD-10-CM | POA: Diagnosis not present

## 2021-06-30 DIAGNOSIS — Z20822 Contact with and (suspected) exposure to covid-19: Secondary | ICD-10-CM | POA: Diagnosis present

## 2021-06-30 DIAGNOSIS — I499 Cardiac arrhythmia, unspecified: Secondary | ICD-10-CM | POA: Diagnosis not present

## 2021-06-30 DIAGNOSIS — E119 Type 2 diabetes mellitus without complications: Secondary | ICD-10-CM | POA: Insufficient documentation

## 2021-06-30 DIAGNOSIS — J45909 Unspecified asthma, uncomplicated: Secondary | ICD-10-CM | POA: Insufficient documentation

## 2021-06-30 DIAGNOSIS — R627 Adult failure to thrive: Secondary | ICD-10-CM | POA: Diagnosis not present

## 2021-06-30 DIAGNOSIS — I517 Cardiomegaly: Secondary | ICD-10-CM | POA: Diagnosis not present

## 2021-06-30 DIAGNOSIS — I4819 Other persistent atrial fibrillation: Secondary | ICD-10-CM | POA: Diagnosis present

## 2021-06-30 DIAGNOSIS — R0989 Other specified symptoms and signs involving the circulatory and respiratory systems: Secondary | ICD-10-CM | POA: Diagnosis not present

## 2021-06-30 DIAGNOSIS — I110 Hypertensive heart disease with heart failure: Secondary | ICD-10-CM | POA: Diagnosis present

## 2021-06-30 DIAGNOSIS — Z885 Allergy status to narcotic agent status: Secondary | ICD-10-CM | POA: Diagnosis not present

## 2021-06-30 DIAGNOSIS — R0689 Other abnormalities of breathing: Secondary | ICD-10-CM | POA: Diagnosis not present

## 2021-06-30 DIAGNOSIS — R2681 Unsteadiness on feet: Secondary | ICD-10-CM | POA: Diagnosis not present

## 2021-06-30 DIAGNOSIS — J90 Pleural effusion, not elsewhere classified: Secondary | ICD-10-CM | POA: Diagnosis not present

## 2021-06-30 DIAGNOSIS — M6259 Muscle wasting and atrophy, not elsewhere classified, multiple sites: Secondary | ICD-10-CM | POA: Diagnosis not present

## 2021-06-30 DIAGNOSIS — I4891 Unspecified atrial fibrillation: Secondary | ICD-10-CM | POA: Diagnosis not present

## 2021-06-30 DIAGNOSIS — Z79899 Other long term (current) drug therapy: Secondary | ICD-10-CM | POA: Diagnosis not present

## 2021-06-30 DIAGNOSIS — Z91018 Allergy to other foods: Secondary | ICD-10-CM | POA: Diagnosis not present

## 2021-06-30 DIAGNOSIS — R531 Weakness: Secondary | ICD-10-CM | POA: Diagnosis not present

## 2021-06-30 DIAGNOSIS — M40203 Unspecified kyphosis, cervicothoracic region: Secondary | ICD-10-CM | POA: Diagnosis present

## 2021-06-30 DIAGNOSIS — Z7951 Long term (current) use of inhaled steroids: Secondary | ICD-10-CM | POA: Diagnosis not present

## 2021-06-30 DIAGNOSIS — R41841 Cognitive communication deficit: Secondary | ICD-10-CM | POA: Diagnosis not present

## 2021-06-30 DIAGNOSIS — Z515 Encounter for palliative care: Secondary | ICD-10-CM | POA: Diagnosis not present

## 2021-06-30 DIAGNOSIS — Z7984 Long term (current) use of oral hypoglycemic drugs: Secondary | ICD-10-CM | POA: Insufficient documentation

## 2021-06-30 DIAGNOSIS — R404 Transient alteration of awareness: Secondary | ICD-10-CM | POA: Diagnosis not present

## 2021-06-30 DIAGNOSIS — Z833 Family history of diabetes mellitus: Secondary | ICD-10-CM | POA: Diagnosis not present

## 2021-06-30 DIAGNOSIS — Z66 Do not resuscitate: Secondary | ICD-10-CM | POA: Diagnosis not present

## 2021-06-30 DIAGNOSIS — Z743 Need for continuous supervision: Secondary | ICD-10-CM | POA: Diagnosis not present

## 2021-06-30 DIAGNOSIS — Z8249 Family history of ischemic heart disease and other diseases of the circulatory system: Secondary | ICD-10-CM | POA: Diagnosis not present

## 2021-06-30 DIAGNOSIS — Z888 Allergy status to other drugs, medicaments and biological substances status: Secondary | ICD-10-CM | POA: Diagnosis not present

## 2021-06-30 DIAGNOSIS — Z7901 Long term (current) use of anticoagulants: Secondary | ICD-10-CM | POA: Diagnosis not present

## 2021-06-30 DIAGNOSIS — M6281 Muscle weakness (generalized): Secondary | ICD-10-CM | POA: Diagnosis not present

## 2021-06-30 DIAGNOSIS — Z6830 Body mass index (BMI) 30.0-30.9, adult: Secondary | ICD-10-CM | POA: Diagnosis not present

## 2021-06-30 DIAGNOSIS — Z9181 History of falling: Secondary | ICD-10-CM | POA: Diagnosis not present

## 2021-06-30 DIAGNOSIS — R54 Age-related physical debility: Secondary | ICD-10-CM | POA: Diagnosis present

## 2021-06-30 DIAGNOSIS — G9341 Metabolic encephalopathy: Secondary | ICD-10-CM | POA: Diagnosis present

## 2021-06-30 DIAGNOSIS — R5381 Other malaise: Secondary | ICD-10-CM | POA: Diagnosis not present

## 2021-06-30 DIAGNOSIS — R0609 Other forms of dyspnea: Secondary | ICD-10-CM | POA: Diagnosis not present

## 2021-06-30 DIAGNOSIS — R262 Difficulty in walking, not elsewhere classified: Secondary | ICD-10-CM | POA: Diagnosis not present

## 2021-06-30 DIAGNOSIS — I5022 Chronic systolic (congestive) heart failure: Secondary | ICD-10-CM | POA: Diagnosis present

## 2021-06-30 DIAGNOSIS — Z886 Allergy status to analgesic agent status: Secondary | ICD-10-CM | POA: Diagnosis not present

## 2021-06-30 DIAGNOSIS — E785 Hyperlipidemia, unspecified: Secondary | ICD-10-CM | POA: Diagnosis not present

## 2021-06-30 LAB — URINALYSIS, COMPLETE (UACMP) WITH MICROSCOPIC
Bilirubin Urine: NEGATIVE
Glucose, UA: NEGATIVE mg/dL
Hgb urine dipstick: NEGATIVE
Ketones, ur: NEGATIVE mg/dL
Nitrite: NEGATIVE
Protein, ur: 30 mg/dL — AB
Specific Gravity, Urine: 1.025 (ref 1.005–1.030)
pH: 6 (ref 5.0–8.0)

## 2021-06-30 LAB — URINALYSIS, ROUTINE W REFLEX MICROSCOPIC
Bacteria, UA: NONE SEEN
Bilirubin Urine: NEGATIVE
Glucose, UA: NEGATIVE mg/dL
Hgb urine dipstick: NEGATIVE
Ketones, ur: NEGATIVE mg/dL
Nitrite: NEGATIVE
Protein, ur: 30 mg/dL — AB
Specific Gravity, Urine: 1.028 (ref 1.005–1.030)
pH: 5 (ref 5.0–8.0)

## 2021-06-30 LAB — CBC
HCT: 30.8 % — ABNORMAL LOW (ref 36.0–46.0)
Hemoglobin: 9.1 g/dL — ABNORMAL LOW (ref 12.0–15.0)
MCH: 26.4 pg (ref 26.0–34.0)
MCHC: 29.5 g/dL — ABNORMAL LOW (ref 30.0–36.0)
MCV: 89.3 fL (ref 80.0–100.0)
Platelets: 292 10*3/uL (ref 150–400)
RBC: 3.45 MIL/uL — ABNORMAL LOW (ref 3.87–5.11)
RDW: 18.4 % — ABNORMAL HIGH (ref 11.5–15.5)
WBC: 7.6 10*3/uL (ref 4.0–10.5)
nRBC: 0 % (ref 0.0–0.2)

## 2021-06-30 LAB — PROTIME-INR
INR: 2.8 — ABNORMAL HIGH (ref 0.8–1.2)
Prothrombin Time: 29.1 seconds — ABNORMAL HIGH (ref 11.4–15.2)

## 2021-06-30 LAB — BASIC METABOLIC PANEL
Anion gap: 6 (ref 5–15)
BUN: 29 mg/dL — ABNORMAL HIGH (ref 8–23)
CO2: 30 mmol/L (ref 22–32)
Calcium: 8.3 mg/dL — ABNORMAL LOW (ref 8.9–10.3)
Chloride: 100 mmol/L (ref 98–111)
Creatinine, Ser: 0.5 mg/dL (ref 0.44–1.00)
GFR, Estimated: >60 mL/min (ref >60)
Glucose, Bld: 149 mg/dL — ABNORMAL HIGH (ref 70–99)
Potassium: 4.9 mmol/L (ref 3.5–5.1)
Sodium: 136 mmol/L (ref 135–145)

## 2021-06-30 LAB — MAGNESIUM: Magnesium: 1.8 mg/dL (ref 1.7–2.4)

## 2021-06-30 MED ORDER — METOPROLOL SUCCINATE ER 100 MG PO TB24
100.0000 mg | ORAL_TABLET | Freq: Two times a day (BID) | ORAL | 0 refills | Status: DC
Start: 2021-07-01 — End: 2021-07-04

## 2021-06-30 MED ORDER — METOPROLOL TARTRATE 25 MG PO TABS
50.0000 mg | ORAL_TABLET | Freq: Once | ORAL | Status: AC
  Administered 2021-06-30: 50 mg via ORAL
  Filled 2021-06-30: qty 2

## 2021-06-30 MED ORDER — METOPROLOL TARTRATE 25 MG PO TABS
25.0000 mg | ORAL_TABLET | Freq: Once | ORAL | Status: AC
  Administered 2021-06-30: 25 mg via ORAL
  Filled 2021-06-30: qty 1

## 2021-06-30 MED ORDER — METOPROLOL SUCCINATE ER 25 MG PO TB24
50.0000 mg | ORAL_TABLET | Freq: Once | ORAL | Status: AC
  Administered 2021-07-01: 50 mg via ORAL
  Filled 2021-06-30: qty 2

## 2021-06-30 NOTE — Discharge Instructions (Signed)
Please adjust your metoprolol succinate/Toprol-XL to 100 mg twice daily.  Once in the morning and once in the evening.  Follow-up with cardiology in the office in the next few days.

## 2021-06-30 NOTE — Telephone Encounter (Signed)
° °  Received a call from home health [place].  Patient recently discharged from [facility] after admission for UTI and rapid atrial fibrillation.  Patient has permanent atrial fibrillation and was discharged home on her previous home dose of metoprolol XL-100 mg daily.  Home health RN reports today that patient has been taking her medicines and eating and drinking well however, heart rates have been trending in the 120s to 140s.  Patient is asymptomatic and afebrile.  Systolic blood pressure around 100.  Patient is already taken her metoprolol this morning.  In the setting of permanent atrial fibrillation with rapid rates, its much more likely that there is an underlying condition resulting in tachycardia.  Discharge summary suggest the patient was to be discharged home on 3 additional days of Keflex however, home health RN notes that patient was not discharged with antibiotic due to apparently receiving additional dose of Rocephin while hospitalized.  I provided an order for repeat urinalysis and advised that results be sent to primary care.  Regarding rapid rates, as these are likely driven by another process, provided that she remains asymptomatic, no additional treatment is required at this time but persistent elevations in heart rate or worsening of clinical status may require ED evaluation.  Caller verbalized understanding and was grateful for the call back.  Murray Hodgkins, NP 06/30/2021, 12:50 PM

## 2021-06-30 NOTE — Progress Notes (Signed)
Called by Dr. Pearline Cables regarding rate control for AF/RVR. Workup has been unrevealing for cause of AF/RVR although in permanent AF, usually rates better controlled. Has not had success with rhythm control in the past. At one point rate controlled with short course of digoxin however I think since she is asx the benefits vs risk of this is not ideal, especially since otherwise she is completely asx. She is relatively rate controlled after receiving metop tartrate (cumulative dose over past 5 hours-100 mg), home dose toprol XL 150 mg qhs. Suggested giving additional toprol XL 50 mg now before transferring back to facility and increasing to toprol XL 100 mg bid to see if we can get improved rate control at higher dose. Dr. Pearline Cables will reach out if she has indication for admission and needs cardiology input, otherwise can f/u as OP. ECG on initial evaluation with AF/RVR at HR 144, now 100-110.

## 2021-06-30 NOTE — ED Provider Notes (Signed)
[facility] EMERGENCY DEPARTMENT Provider Note   CSN: [id number] Arrival date & time: 06/30/21  1736     History  Chief Complaint  Patient presents with   Tachycardia    Casey Patel is a 88 y.o. female.  This is a 88 y.o. female with significant medical history as below, including atrial fibrillation on warfarin who presents to the ED with complaint of rapid heart rate.  She was recently discharged from hospital secondary to fall, UTI, atrial fibrillation with RVR.  She has been having difficulty with ambulation since being discharged, she has physical therapy and home health nurse at home.  Caregiver concerned that patient's urine has remained dark and cloudy since discharge from the hospital.  Vital signs were checked today by home health nurse which reported her heart rate was elevated 130s 140s.  Has been compliant with her home beta-blocker and warfarin.  No chest pain, dyspnea, palpitations, nausea or vomiting.  No change in bowel or bladder function her baseline.  Pt follows with [facility], hx permanent afib, not amenable to cardioversion in the past. On warfarin.   The history is provided by the patient and a relative. No language interpreter was used.   Patient Active Problem List   Diagnosis Date Noted   Acute metabolic encephalopathy 29/93/7169   Atrial fibrillation with RVR ([place]) 06/25/2021   Subtherapeutic international normalized ratio (INR) 06/25/2021   UTI (urinary tract infection) 06/24/2021   Pressure injury of skin 03/08/2018   Atrial fibrillation ([place]) 03/07/2018   Cough 01/01/2012   Pneumonia 12/03/2011   Arthritis    Hypertension    Hyperlipidemia    Asthma    DM (diabetes mellitus) ([place])        Home Medications Prior to Admission medications   Medication Sig Start Date End Date Taking? Authorizing Provider  Ascorbic Acid (VITAMIN C) 1000 MG tablet Take 1,000 mg by mouth daily.   Yes [provider]  bacitracin ointment Apply  topically 2 (two) times daily for 7 days. Apply to right wrist Patient taking differently: Apply 1 application topically See admin instructions. Apply to right wrist 2 times a day for 7 days 06/27/21 07/04/21 Yes Antonieta Pert, MD  Cholecalciferol (VITAMIN D3) 50 MCG (2000 UT) TABS Take 2,000 Units by mouth daily.   Yes [provider]  docusate sodium (COLACE) 100 MG capsule Take 1 capsule (100 mg total) by mouth daily as needed for mild constipation. Patient taking differently: Take 100 mg by mouth daily. 03/11/18  Yes Patrecia Pour, MD  feeding supplement, GLUCERNA SHAKE, (GLUCERNA SHAKE) LIQD Take 237 mLs by mouth 2 (two) times daily between meals.   Yes [provider]  fluticasone furoate-vilanterol (BREO ELLIPTA) 100-25 MCG/INH AEPB Inhale 1 puff into the lungs at bedtime.   Yes [provider]  loratadine (CLARITIN) 10 MG tablet Take 10 mg by mouth daily.   Yes [provider]  metFORMIN (GLUCOPHAGE) 500 MG tablet Take 500 mg by mouth 2 (two) times daily with a meal.  02/06/14  Yes [provider]  methocarbamol (ROBAXIN) 750 MG tablet Take 750 mg by mouth 3 (three) times daily.    Yes [provider]  metoprolol (TOPROL-XL) 100 MG 24 hr tablet Take 150 mg by mouth at bedtime.   Yes [provider]  metoprolol succinate (TOPROL-XL) 100 MG 24 hr tablet Take 1 tablet (100 mg total) by mouth in the morning and at bedtime for 14 days. 07/01/21 07/15/21 Yes Wynona Dove  A, DO  Multiple Vitamin (MULTIVITAMIN WITH MINERALS) TABS tablet Take 1 tablet by mouth daily. 06/28/21 07/28/21 Yes Antonieta Pert, MD  omeprazole (PRILOSEC) 20 MG capsule Take 20 mg by mouth daily before breakfast.   Yes [provider]  PROAIR HFA 108 (90 Base) MCG/ACT inhaler Inhale 2 puffs into the lungs every 6 (six) hours as needed for wheezing or shortness of breath.   Yes [provider]  traMADol (ULTRAM) 50 MG tablet Take 1 tablet (50 mg total) by mouth  every 6 (six) hours as needed for up to 4 doses for moderate pain (pain). Patient taking differently: Take 50 mg by mouth every 6 (six) hours as needed for moderate pain. 06/27/21  Yes Antonieta Pert, MD  warfarin (COUMADIN) 5 MG tablet Take 2.5 mg by mouth See admin instructions. Take 2.5 mg by mouth with supper on Sun/Tues/Wed/Thurs/Sat and nothing on Mondays and Fridays   Yes [provider]  nutrition supplement, JUVEN, (JUVEN) PACK Take 1 packet by mouth 2 (two) times daily with a meal. Patient not taking: Reported on 06/30/2021 06/27/21   Antonieta Pert, MD      Allergies    Wheat bran, Kiwi extract, Orange fruit [citrus], Phenergan [promethazine], Pineapple, Promethazine hcl, Tomato, Tylenol [acetaminophen], Vistaril [hydroxyzine hcl], Codeine, and Tape    Review of Systems   Review of Systems  Constitutional:  Positive for fatigue. Negative for chills and fever.  HENT:  Negative for facial swelling and trouble swallowing.   Eyes:  Negative for photophobia and visual disturbance.  Respiratory:  Negative for cough and shortness of breath.   Cardiovascular:  Positive for palpitations. Negative for chest pain.  Gastrointestinal:  Negative for abdominal pain, nausea and vomiting.  Endocrine: Negative for polydipsia and polyuria.  Genitourinary:  Negative for difficulty urinating and hematuria.  Musculoskeletal:  Negative for gait problem and joint swelling.  Skin:  Negative for pallor and rash.  Neurological:  Negative for syncope and headaches.  Psychiatric/Behavioral:  Negative for agitation and confusion.    Physical Exam Updated Vital Signs BP 108/70    Pulse (!) 122    Temp (!) 97.5 F (36.4 C) (Oral)    Resp (!) 21    Ht 5\' 6"  (1.676 m)    Wt 80 kg    SpO2 90%    BMI 28.47 kg/m  Physical Exam Vitals and nursing note reviewed.  Constitutional:      General: She is not in acute distress.    Appearance: Normal appearance.  HENT:     Head: Normocephalic and atraumatic.       Right Ear: External ear normal.     Left Ear: External ear normal.     Nose: Nose normal.     Mouth/Throat:     Mouth: Mucous membranes are moist.  Eyes:     General: No scleral icterus.       Right eye: No discharge.        Left eye: No discharge.  Cardiovascular:     Rate and Rhythm: Tachycardia present. Rhythm irregular.     Pulses: Normal pulses.     Heart sounds: Normal heart sounds.  Pulmonary:     Effort: Pulmonary effort is normal. No respiratory distress.     Breath sounds: Normal breath sounds.  Abdominal:     General: Abdomen is flat.     Tenderness: There is no abdominal tenderness.  Musculoskeletal:        General: Normal range of motion.  Arms:     Cervical back: Normal range of motion.     Right lower leg: No edema.     Left lower leg: No edema.       Legs:  Skin:    General: Skin is warm and dry.     Capillary Refill: Capillary refill takes less than 2 seconds.  Neurological:     Mental Status: She is alert.  Psychiatric:        Mood and Affect: Mood normal.        Behavior: Behavior normal.    ED Results / Procedures / Treatments   Labs (all labs ordered are listed, but only abnormal results are displayed) Labs Reviewed  BASIC METABOLIC PANEL - Abnormal; Notable for the following components:      Result Value   Glucose, Bld 149 (*)    BUN 29 (*)    Calcium 8.3 (*)    All other components within normal limits  CBC - Abnormal; Notable for the following components:   RBC 3.45 (*)    Hemoglobin 9.1 (*)    HCT 30.8 (*)    MCHC 29.5 (*)    RDW 18.4 (*)    All other components within normal limits  PROTIME-INR - Abnormal; Notable for the following components:   Prothrombin Time 29.1 (*)    INR 2.8 (*)    All other components within normal limits  URINALYSIS, ROUTINE W REFLEX MICROSCOPIC - Abnormal; Notable for the following components:   Color, Urine AMBER (*)    APPearance HAZY (*)    Protein, ur 30 (*)    Leukocytes,Ua TRACE (*)    All  other components within normal limits  MAGNESIUM    EKG None  Radiology DG Chest Portable 1 View  Result Date: 06/30/2021 CLINICAL DATA:  Tachycardia. EXAM: PORTABLE CHEST 1 VIEW COMPARISON:  07/23/2020. FINDINGS: The heart is enlarged. Atherosclerotic calcification of the aorta is noted. Stable calcifications are noted at the mediastinum and hilar regions bilaterally, however the upper mediastinum and lung apices are not well seen due to overlapping structures. The pulmonary vasculature is not well seen. There is patchy airspace disease at the lung bases with small bilateral pleural effusions. No definite pneumothorax. No acute osseous abnormality. IMPRESSION: 1. Cardiomegaly. 2. Patchy airspace disease at the lung bases with small bilateral pleural effusions. 3. Aortic atherosclerosis. Electronically Signed   By: Brett Fairy M.D.   On: 06/30/2021 20:13    Procedures Procedures    Medications Ordered in ED Medications  metoprolol succinate (TOPROL-XL) 24 hr tablet 50 mg (has no administration in time range)  metoprolol tartrate (LOPRESSOR) tablet 25 mg (25 mg Oral Given 06/30/21 2052)  metoprolol tartrate (LOPRESSOR) tablet 25 mg (25 mg Oral Given 06/30/21 2213)  metoprolol tartrate (LOPRESSOR) tablet 50 mg (50 mg Oral Given 06/30/21 2305)    ED Course/ Medical Decision Making/ A&P                           Medical Decision Making   CC: Palpitations, rapid heart rate  This patient complains of above; this involves an extensive number of treatment options and is a complaint that carries with it a high risk of complications and morbidity. Vital signs were reviewed. Serious etiologies considered.  Record review:   Previous records obtained and reviewed   Additional history obtained from caregiver  Work up as above, notable for:  Labs & imaging results that were available during  my care of the patient were reviewed by me and considered in my medical decision making.   I ordered  imaging studies which included chest x-ray and I independently visualized and interpreted imaging which showed small bilateral pleural effusions, cardiomegaly. She has no cough or dib, no fevers, no symptoms a/w pneumonia.   Urinalysis does not demonstrate acute infection.  There are WBCs but no bacteria seen.  EKG with afib with RVR.  Management: Patient given another Lopressor 100 mg.  Mild improvement to her heart rate.  Rate between 100-115. Will d/w cardiology regarding further recommendations for rate control. In the past she was placed on digoxin briefly during prior admission.    Cardiac monitoring reviewed by myself demonstrates atrial fibrillation.   Discussed with cardiology on-call, recommend increasing patient's Toprol-XL to 100 mg twice daily.  Recommend giving 50 mg Toprol-XL prior to discharge.  will follow-up in the clinic in the next few days.  D/w with the patient, she is agreeable.  She remains otherwise asymptomatic.  She feels she is currently at her approximate baseline.   Heart rate at this time range between 100-110; will intermittently increase to 120 briefly and then back to <110.  INR is therapeutic. Pt's nasal cannula had come out of her nose and pulse ox was around 91-92, replaced cannula and pulse ox returned to 97-98%. No dyspnea, no respiratory distress.   The patient improved significantly and was discharged in stable condition. Detailed discussions were had with the patient regarding current findings, and need for close f/u with PCP or on call doctor. The patient has been instructed to return immediately if the symptoms worsen in any way for re-evaluation. Patient verbalized understanding and is in agreement with current care plan. All questions answered prior to discharge.    This chart was dictated using voice recognition software.  Despite best efforts to proofread,  errors can occur which can change the documentation meaning. Final Clinical Impression(s) /  ED Diagnoses Final diagnoses:  Atrial fibrillation with RVR ([place])    Rx / DC Orders ED Discharge Orders          Ordered    metoprolol succinate (TOPROL-XL) 100 MG 24 hr tablet  2 times daily        06/30/21 2353              Wynona Dove A, DO 07/01/21 0002

## 2021-06-30 NOTE — ED Triage Notes (Signed)
Pt arrived via [facility] from home for c/o tachycardia. Pt denies chest pain, SOB or any other sx. Pt is A-fib w/RVR on monitor. Pt is A&Ox4. VSS

## 2021-07-02 ENCOUNTER — Encounter (HOSPITAL_COMMUNITY): Payer: Self-pay | Admitting: Emergency Medicine

## 2021-07-02 ENCOUNTER — Emergency Department (HOSPITAL_COMMUNITY): Payer: Medicare Other

## 2021-07-02 ENCOUNTER — Inpatient Hospital Stay (HOSPITAL_COMMUNITY)
Admission: EM | Admit: 2021-07-02 | Discharge: 2021-07-08 | DRG: 308 | Disposition: A | Discharge status: Skilled Nursing Facility | Payer: Medicare Other | Attending: Internal Medicine | Admitting: Internal Medicine

## 2021-07-02 ENCOUNTER — Other Ambulatory Visit: Payer: Self-pay

## 2021-07-02 DIAGNOSIS — G9341 Metabolic encephalopathy: Secondary | ICD-10-CM | POA: Diagnosis present

## 2021-07-02 DIAGNOSIS — M6281 Muscle weakness (generalized): Secondary | ICD-10-CM | POA: Diagnosis not present

## 2021-07-02 DIAGNOSIS — Z7951 Long term (current) use of inhaled steroids: Secondary | ICD-10-CM

## 2021-07-02 DIAGNOSIS — E119 Type 2 diabetes mellitus without complications: Secondary | ICD-10-CM

## 2021-07-02 DIAGNOSIS — M40203 Unspecified kyphosis, cervicothoracic region: Secondary | ICD-10-CM | POA: Diagnosis present

## 2021-07-02 DIAGNOSIS — R41841 Cognitive communication deficit: Secondary | ICD-10-CM | POA: Diagnosis not present

## 2021-07-02 DIAGNOSIS — R000 Tachycardia, unspecified: Secondary | ICD-10-CM | POA: Diagnosis not present

## 2021-07-02 DIAGNOSIS — E785 Hyperlipidemia, unspecified: Secondary | ICD-10-CM | POA: Diagnosis present

## 2021-07-02 DIAGNOSIS — Z885 Allergy status to narcotic agent status: Secondary | ICD-10-CM | POA: Diagnosis not present

## 2021-07-02 DIAGNOSIS — Z9181 History of falling: Secondary | ICD-10-CM | POA: Diagnosis not present

## 2021-07-02 DIAGNOSIS — I4819 Other persistent atrial fibrillation: Secondary | ICD-10-CM | POA: Diagnosis present

## 2021-07-02 DIAGNOSIS — R2681 Unsteadiness on feet: Secondary | ICD-10-CM | POA: Diagnosis not present

## 2021-07-02 DIAGNOSIS — R296 Repeated falls: Secondary | ICD-10-CM | POA: Diagnosis present

## 2021-07-02 DIAGNOSIS — Z8261 Family history of arthritis: Secondary | ICD-10-CM

## 2021-07-02 DIAGNOSIS — Z87891 Personal history of nicotine dependence: Secondary | ICD-10-CM

## 2021-07-02 DIAGNOSIS — Z6830 Body mass index (BMI) 30.0-30.9, adult: Secondary | ICD-10-CM | POA: Diagnosis not present

## 2021-07-02 DIAGNOSIS — I4891 Unspecified atrial fibrillation: Secondary | ICD-10-CM

## 2021-07-02 DIAGNOSIS — Z91018 Allergy to other foods: Secondary | ICD-10-CM | POA: Diagnosis not present

## 2021-07-02 DIAGNOSIS — R627 Adult failure to thrive: Secondary | ICD-10-CM | POA: Diagnosis present

## 2021-07-02 DIAGNOSIS — Z66 Do not resuscitate: Secondary | ICD-10-CM | POA: Diagnosis present

## 2021-07-02 DIAGNOSIS — Z833 Family history of diabetes mellitus: Secondary | ICD-10-CM

## 2021-07-02 DIAGNOSIS — Z7189 Other specified counseling: Secondary | ICD-10-CM | POA: Diagnosis not present

## 2021-07-02 DIAGNOSIS — R531 Weakness: Secondary | ICD-10-CM | POA: Diagnosis not present

## 2021-07-02 DIAGNOSIS — Z803 Family history of malignant neoplasm of breast: Secondary | ICD-10-CM

## 2021-07-02 DIAGNOSIS — M6259 Muscle wasting and atrophy, not elsewhere classified, multiple sites: Secondary | ICD-10-CM | POA: Diagnosis not present

## 2021-07-02 DIAGNOSIS — I5022 Chronic systolic (congestive) heart failure: Secondary | ICD-10-CM | POA: Diagnosis present

## 2021-07-02 DIAGNOSIS — I110 Hypertensive heart disease with heart failure: Secondary | ICD-10-CM | POA: Diagnosis present

## 2021-07-02 DIAGNOSIS — Z886 Allergy status to analgesic agent status: Secondary | ICD-10-CM | POA: Diagnosis not present

## 2021-07-02 DIAGNOSIS — Z7901 Long term (current) use of anticoagulants: Secondary | ICD-10-CM | POA: Diagnosis not present

## 2021-07-02 DIAGNOSIS — Z7984 Long term (current) use of oral hypoglycemic drugs: Secondary | ICD-10-CM

## 2021-07-02 DIAGNOSIS — I4892 Unspecified atrial flutter: Secondary | ICD-10-CM | POA: Insufficient documentation

## 2021-07-02 DIAGNOSIS — Z20822 Contact with and (suspected) exposure to covid-19: Secondary | ICD-10-CM | POA: Diagnosis present

## 2021-07-02 DIAGNOSIS — R54 Age-related physical debility: Secondary | ICD-10-CM | POA: Diagnosis present

## 2021-07-02 DIAGNOSIS — I10 Essential (primary) hypertension: Secondary | ICD-10-CM | POA: Diagnosis present

## 2021-07-02 DIAGNOSIS — R404 Transient alteration of awareness: Secondary | ICD-10-CM | POA: Diagnosis not present

## 2021-07-02 DIAGNOSIS — R4189 Other symptoms and signs involving cognitive functions and awareness: Secondary | ICD-10-CM | POA: Diagnosis present

## 2021-07-02 DIAGNOSIS — Z8249 Family history of ischemic heart disease and other diseases of the circulatory system: Secondary | ICD-10-CM | POA: Diagnosis not present

## 2021-07-02 DIAGNOSIS — R262 Difficulty in walking, not elsewhere classified: Secondary | ICD-10-CM | POA: Diagnosis not present

## 2021-07-02 DIAGNOSIS — D72829 Elevated white blood cell count, unspecified: Secondary | ICD-10-CM

## 2021-07-02 DIAGNOSIS — R0609 Other forms of dyspnea: Secondary | ICD-10-CM | POA: Diagnosis not present

## 2021-07-02 DIAGNOSIS — R5381 Other malaise: Secondary | ICD-10-CM | POA: Diagnosis not present

## 2021-07-02 DIAGNOSIS — Z79899 Other long term (current) drug therapy: Secondary | ICD-10-CM

## 2021-07-02 DIAGNOSIS — Z743 Need for continuous supervision: Secondary | ICD-10-CM | POA: Diagnosis not present

## 2021-07-02 DIAGNOSIS — Z515 Encounter for palliative care: Secondary | ICD-10-CM | POA: Diagnosis not present

## 2021-07-02 DIAGNOSIS — Z888 Allergy status to other drugs, medicaments and biological substances status: Secondary | ICD-10-CM | POA: Diagnosis not present

## 2021-07-02 DIAGNOSIS — I499 Cardiac arrhythmia, unspecified: Secondary | ICD-10-CM | POA: Diagnosis not present

## 2021-07-02 DIAGNOSIS — J90 Pleural effusion, not elsewhere classified: Secondary | ICD-10-CM | POA: Diagnosis not present

## 2021-07-02 DIAGNOSIS — Z7401 Bed confinement status: Secondary | ICD-10-CM | POA: Diagnosis not present

## 2021-07-02 DIAGNOSIS — Z823 Family history of stroke: Secondary | ICD-10-CM

## 2021-07-02 DIAGNOSIS — R0689 Other abnormalities of breathing: Secondary | ICD-10-CM | POA: Diagnosis not present

## 2021-07-02 LAB — PROTIME-INR
INR: 2.2 — ABNORMAL HIGH (ref 0.8–1.2)
Prothrombin Time: 24 seconds — ABNORMAL HIGH (ref 11.4–15.2)

## 2021-07-02 LAB — TROPONIN I (HIGH SENSITIVITY)
Troponin I (High Sensitivity): 18 ng/L — ABNORMAL HIGH (ref <18)
Troponin I (High Sensitivity): 19 ng/L — ABNORMAL HIGH (ref <18)

## 2021-07-02 LAB — URINALYSIS, ROUTINE W REFLEX MICROSCOPIC
Bilirubin Urine: NEGATIVE
Glucose, UA: NEGATIVE mg/dL
Hgb urine dipstick: NEGATIVE
Ketones, ur: NEGATIVE mg/dL
Nitrite: NEGATIVE
Protein, ur: 30 mg/dL — AB
Specific Gravity, Urine: 1.028 (ref 1.005–1.030)
pH: 5 (ref 5.0–8.0)

## 2021-07-02 LAB — CBC WITH DIFFERENTIAL/PLATELET
Abs Immature Granulocytes: 0.06 10*3/uL (ref 0.00–0.07)
Basophils Absolute: 0 10*3/uL (ref 0.0–0.1)
Basophils Relative: 0 %
Eosinophils Absolute: 0 10*3/uL (ref 0.0–0.5)
Eosinophils Relative: 0 %
HCT: 31.7 % — ABNORMAL LOW (ref 36.0–46.0)
Hemoglobin: 9.4 g/dL — ABNORMAL LOW (ref 12.0–15.0)
Immature Granulocytes: 1 %
Lymphocytes Relative: 5 %
Lymphs Abs: 0.5 10*3/uL — ABNORMAL LOW (ref 0.7–4.0)
MCH: 27 pg (ref 26.0–34.0)
MCHC: 29.7 g/dL — ABNORMAL LOW (ref 30.0–36.0)
MCV: 91.1 fL (ref 80.0–100.0)
Monocytes Absolute: 0.7 10*3/uL (ref 0.1–1.0)
Monocytes Relative: 6 %
Neutro Abs: 9 10*3/uL — ABNORMAL HIGH (ref 1.7–7.7)
Neutrophils Relative %: 88 %
Platelets: 329 10*3/uL (ref 150–400)
RBC: 3.48 MIL/uL — ABNORMAL LOW (ref 3.87–5.11)
RDW: 18.3 % — ABNORMAL HIGH (ref 11.5–15.5)
WBC: 10.3 10*3/uL (ref 4.0–10.5)
nRBC: 0 % (ref 0.0–0.2)

## 2021-07-02 LAB — COMPREHENSIVE METABOLIC PANEL
ALT: 19 U/L (ref 0–44)
AST: 28 U/L (ref 15–41)
Albumin: 2.3 g/dL — ABNORMAL LOW (ref 3.5–5.0)
Alkaline Phosphatase: 68 U/L (ref 38–126)
Anion gap: 8 (ref 5–15)
BUN: 32 mg/dL — ABNORMAL HIGH (ref 8–23)
CO2: 29 mmol/L (ref 22–32)
Calcium: 8.2 mg/dL — ABNORMAL LOW (ref 8.9–10.3)
Chloride: 102 mmol/L (ref 98–111)
Creatinine, Ser: 0.63 mg/dL (ref 0.44–1.00)
GFR, Estimated: >60 mL/min (ref >60)
Glucose, Bld: 158 mg/dL — ABNORMAL HIGH (ref 70–99)
Potassium: 4.9 mmol/L (ref 3.5–5.1)
Sodium: 139 mmol/L (ref 135–145)
Total Bilirubin: 0.7 mg/dL (ref 0.3–1.2)
Total Protein: 5.8 g/dL — ABNORMAL LOW (ref 6.5–8.1)

## 2021-07-02 LAB — RESP PANEL BY RT-PCR (FLU A&B, COVID) ARPGX2
Influenza A by PCR: NEGATIVE
Influenza B by PCR: NEGATIVE
SARS Coronavirus 2 by RT PCR: NEGATIVE

## 2021-07-02 LAB — CBG MONITORING, ED
Glucose-Capillary: 138 mg/dL — ABNORMAL HIGH (ref 70–99)
Glucose-Capillary: 142 mg/dL — ABNORMAL HIGH (ref 70–99)

## 2021-07-02 LAB — LIPASE, BLOOD: Lipase: 20 U/L (ref 11–51)

## 2021-07-02 LAB — URINE CULTURE: Culture: NO GROWTH

## 2021-07-02 LAB — BRAIN NATRIURETIC PEPTIDE: B Natriuretic Peptide: 283.1 pg/mL — ABNORMAL HIGH (ref 0.0–100.0)

## 2021-07-02 MED ORDER — OXYCODONE HCL 5 MG PO TABS: 5.0000 mg | ORAL_TABLET | ORAL | Status: DC | PRN

## 2021-07-02 MED ORDER — POLYETHYLENE GLYCOL 3350 17 G PO PACK: 17.0000 g | PACK | Freq: Every day | ORAL | Status: DC | PRN

## 2021-07-02 MED ORDER — FUROSEMIDE 10 MG/ML IJ SOLN
20.0000 mg | Freq: Once | INTRAMUSCULAR | Status: AC
  Administered 2021-07-02: 20 mg via INTRAVENOUS
  Filled 2021-07-02: qty 2

## 2021-07-02 MED ORDER — METOPROLOL TARTRATE 5 MG/5ML IV SOLN
2.5000 mg | Freq: Once | INTRAVENOUS | Status: AC
  Administered 2021-07-02: 2.5 mg via INTRAVENOUS
  Filled 2021-07-02: qty 5

## 2021-07-02 MED ORDER — WARFARIN SODIUM 2.5 MG PO TABS
2.5000 mg | ORAL_TABLET | Freq: Once | ORAL | Status: AC
  Administered 2021-07-02: 2.5 mg via ORAL
  Filled 2021-07-02: qty 1

## 2021-07-02 MED ORDER — INSULIN ASPART 100 UNIT/ML IJ SOLN: 0.0000 [IU] | Freq: Every day | INTRAMUSCULAR | Status: DC

## 2021-07-02 MED ORDER — SODIUM CHLORIDE 0.9% FLUSH
3.0000 mL | Freq: Two times a day (BID) | INTRAVENOUS | Status: DC
  Administered 2021-07-03 – 2021-07-08 (×9): 3 mL via INTRAVENOUS

## 2021-07-02 MED ORDER — INSULIN ASPART 100 UNIT/ML IJ SOLN
0.0000 [IU] | Freq: Three times a day (TID) | INTRAMUSCULAR | Status: DC
  Administered 2021-07-03: 1 [IU] via SUBCUTANEOUS
  Administered 2021-07-03: 2 [IU] via SUBCUTANEOUS
  Administered 2021-07-03 – 2021-07-04 (×3): 1 [IU] via SUBCUTANEOUS
  Administered 2021-07-04: 2 [IU] via SUBCUTANEOUS
  Administered 2021-07-05 (×3): 1 [IU] via SUBCUTANEOUS
  Administered 2021-07-06: 2 [IU] via SUBCUTANEOUS
  Administered 2021-07-06: 1 [IU] via SUBCUTANEOUS
  Administered 2021-07-07 – 2021-07-08 (×2): 2 [IU] via SUBCUTANEOUS

## 2021-07-02 MED ORDER — ALBUTEROL SULFATE (2.5 MG/3ML) 0.083% IN NEBU: 2.5000 mg | INHALATION_SOLUTION | Freq: Four times a day (QID) | RESPIRATORY_TRACT | Status: DC | PRN

## 2021-07-02 MED ORDER — METHOCARBAMOL 500 MG PO TABS
750.0000 mg | ORAL_TABLET | Freq: Three times a day (TID) | ORAL | Status: DC
  Administered 2021-07-03 – 2021-07-08 (×16): 750 mg via ORAL
  Filled 2021-07-02 (×16): qty 2

## 2021-07-02 MED ORDER — PANTOPRAZOLE SODIUM 40 MG PO TBEC
40.0000 mg | DELAYED_RELEASE_TABLET | Freq: Every day | ORAL | Status: DC
  Administered 2021-07-03 – 2021-07-08 (×6): 40 mg via ORAL
  Filled 2021-07-02 (×6): qty 1

## 2021-07-02 MED ORDER — ONDANSETRON HCL 4 MG PO TABS: 4.0000 mg | ORAL_TABLET | Freq: Four times a day (QID) | ORAL | Status: DC | PRN

## 2021-07-02 MED ORDER — SODIUM CHLORIDE 0.9 % IV BOLUS
500.0000 mL | Freq: Once | INTRAVENOUS | Status: AC
  Administered 2021-07-02: 500 mL via INTRAVENOUS

## 2021-07-02 MED ORDER — FLUTICASONE FUROATE-VILANTEROL 100-25 MCG/ACT IN AEPB
1.0000 | INHALATION_SPRAY | Freq: Every day | RESPIRATORY_TRACT | Status: DC
  Filled 2021-07-02 (×3): qty 28

## 2021-07-02 MED ORDER — WARFARIN - PHARMACIST DOSING INPATIENT: Freq: Every day | Status: DC

## 2021-07-02 MED ORDER — DOCUSATE SODIUM 100 MG PO CAPS
100.0000 mg | ORAL_CAPSULE | Freq: Two times a day (BID) | ORAL | Status: DC
  Administered 2021-07-03 – 2021-07-07 (×9): 100 mg via ORAL
  Filled 2021-07-02 (×10): qty 1

## 2021-07-02 MED ORDER — HYDRALAZINE HCL 20 MG/ML IJ SOLN: 5.0000 mg | INTRAMUSCULAR | Status: DC | PRN

## 2021-07-02 MED ORDER — ONDANSETRON HCL 4 MG/2ML IJ SOLN: 4.0000 mg | Freq: Four times a day (QID) | INTRAMUSCULAR | Status: DC | PRN

## 2021-07-02 MED ORDER — METHOCARBAMOL 500 MG PO TABS
750.0000 mg | ORAL_TABLET | Freq: Three times a day (TID) | ORAL | Status: DC
  Administered 2021-07-02: 750 mg via ORAL
  Filled 2021-07-02: qty 2

## 2021-07-02 MED ORDER — TRAMADOL HCL 50 MG PO TABS: 50.0000 mg | ORAL_TABLET | Freq: Four times a day (QID) | ORAL | Status: DC | PRN

## 2021-07-02 MED ORDER — DILTIAZEM HCL-DEXTROSE 125-5 MG/125ML-% IV SOLN (PREMIX)
5.0000 mg/h | INTRAVENOUS | Status: DC
  Administered 2021-07-02 – 2021-07-04 (×3): 5 mg/h via INTRAVENOUS
  Filled 2021-07-02 (×4): qty 125

## 2021-07-02 MED ORDER — BISACODYL 5 MG PO TBEC: 5.0000 mg | DELAYED_RELEASE_TABLET | Freq: Every day | ORAL | Status: DC | PRN

## 2021-07-02 MED ORDER — LORATADINE 10 MG PO TABS
10.0000 mg | ORAL_TABLET | Freq: Every day | ORAL | Status: DC
  Administered 2021-07-03 – 2021-07-08 (×6): 10 mg via ORAL
  Filled 2021-07-02 (×6): qty 1

## 2021-07-02 NOTE — ED Notes (Signed)
The pt is reporting that someone  in the family is angry with her  sister no longer at  the bedside

## 2021-07-02 NOTE — Assessment & Plan Note (Signed)
-  Patient with known afib presenting with persistent tachycardia -On previous ER evaluation, Toprol XL was increased from 150 mg daily to 100 mg BID without apparent improvement  -Will admit to SDU for Diltiazem drip as per protocol with plan to transition to PO Diltiazem once heart rate is controlled (resting HR 110 or lower -HS troponin negative x 2 -Consider cardiology consult - but will consult palliative care first. -Continue Coumadin but she is such a high fall risk that this needs to be considered.  HTN -Takes Toprol XL monotherapy at home -Patient with borderline low BP while in the ER -Will hold Toprol and start Diltiazem -Will also add prn hydralazine  HLD -She does not appear to be taking medications for this issue at this time  DM -Last A1c was 6.0 -Hold Glucophage -Will cover with sensitive-scale SSI for now -Generally glycemic goals for her are to avoid hypoglycemia > severe hyperglycemia

## 2021-07-02 NOTE — ED Triage Notes (Signed)
Pt here via [facility] from home when sister/caregiver noticed tachycardia. Per sister pt had a fall 12/26 and injured R wrist, pt has required 4L of O2 since the fall, has also had UTI. Pt is normally aox4 and uses a walker, now she is bed bound and aox2. W/ EMS HR 150s afib, gave 10mg  cardizem w/ no change. 464ml NS given. 114/84, 150HR, 20RR, 20 ETCO2, 96% 4L O2.

## 2021-07-02 NOTE — ED Provider Notes (Signed)
[facility]PART[facility] Provider Note   CSN: [id number] Arrival date & time: 07/02/21  1115     History  Chief Complaint  Patient presents with   Atrial Fibrillation   Weakness    Casey Patel is a 88 y.o. female.  She is brought in by EMS from home for weakness and atrial fibrillation.  She has a history of chronic A. fib on Coumadin.  Was seen a few weeks ago for a fall and found to have a UTI.  Was seen here 2 days ago for difficulty ambulation and elevated heart rate.  Beta-blocker was increased.  Lives with her sister.  Patient's only complaint is pain in her right knee from her prior fall.  Asking for a pillow for it.  Denies any chest pain shortness of breath abdominal pain vomiting diarrhea.  No urinary symptoms.  EMS found with heart rate in the 150s and gave her 10 of Cardizem.  Also give her some fluids.  The history is provided by the patient and the EMS personnel.  Atrial Fibrillation This is a chronic problem. The problem occurs constantly. The problem has not changed since onset.Pertinent negatives include no chest pain, no abdominal pain, no headaches and no shortness of breath. Nothing aggravates the symptoms. Nothing relieves the symptoms. She has tried nothing for the symptoms. The treatment provided no relief.  Weakness Severity:  Moderate Onset quality:  Gradual Duration:  2 weeks Timing:  Constant Progression:  Unchanged Chronicity:  New Context: recent infection   Relieved by:  Nothing Worsened by:  Activity Ineffective treatments:  Rest Associated symptoms: difficulty walking and falls   Associated symptoms: no abdominal pain, no chest pain, no cough, no diarrhea, no dysuria, no fever, no foul-smelling urine, no headaches, no nausea, no shortness of breath and no vomiting   Risk factors: congestive heart failure       Home Medications Prior to Admission medications   Medication Sig Start Date End Date Taking? Authorizing  Provider  Ascorbic Acid (VITAMIN C) 1000 MG tablet Take 1,000 mg by mouth daily.    [provider]  bacitracin ointment Apply topically 2 (two) times daily for 7 days. Apply to right wrist Patient taking differently: Apply 1 application topically See admin instructions. Apply to right wrist 2 times a day for 7 days 06/27/21 07/04/21  Antonieta Pert, MD  Cholecalciferol (VITAMIN D3) 50 MCG (2000 UT) TABS Take 2,000 Units by mouth daily.    [provider]  docusate sodium (COLACE) 100 MG capsule Take 1 capsule (100 mg total) by mouth daily as needed for mild constipation. Patient taking differently: Take 100 mg by mouth daily. 03/11/18   Patrecia Pour, MD  feeding supplement, GLUCERNA SHAKE, (GLUCERNA SHAKE) LIQD Take 237 mLs by mouth 2 (two) times daily between meals.    [provider]  fluticasone furoate-vilanterol (BREO ELLIPTA) 100-25 MCG/INH AEPB Inhale 1 puff into the lungs at bedtime.    [provider]  loratadine (CLARITIN) 10 MG tablet Take 10 mg by mouth daily.    [provider]  metFORMIN (GLUCOPHAGE) 500 MG tablet Take 500 mg by mouth 2 (two) times daily with a meal.  02/06/14   [provider]  methocarbamol (ROBAXIN) 750 MG tablet Take 750 mg by mouth 3 (three) times daily.     [provider]  metoprolol (TOPROL-XL) 100 MG 24 hr tablet Take 150 mg by mouth at bedtime.    [provider]  metoprolol  succinate (TOPROL-XL) 100 MG 24 hr tablet Take 1 tablet (100 mg total) by mouth in the morning and at bedtime for 14 days. 07/01/21 07/15/21  Jeanell Sparrow, DO  Multiple Vitamin (MULTIVITAMIN WITH MINERALS) TABS tablet Take 1 tablet by mouth daily. 06/28/21 07/28/21  Antonieta Pert, MD  nutrition supplement, Fanny Dance, (JUVEN) PACK Take 1 packet by mouth 2 (two) times daily with a meal. Patient not taking: Reported on 06/30/2021 06/27/21   Antonieta Pert, MD  omeprazole (PRILOSEC) 20 MG capsule Take 20 mg by mouth daily before breakfast.     [provider]  PROAIR HFA 108 (90 Base) MCG/ACT inhaler Inhale 2 puffs into the lungs every 6 (six) hours as needed for wheezing or shortness of breath.    [provider]  traMADol (ULTRAM) 50 MG tablet Take 1 tablet (50 mg total) by mouth every 6 (six) hours as needed for up to 4 doses for moderate pain (pain). Patient taking differently: Take 50 mg by mouth every 6 (six) hours as needed for moderate pain. 06/27/21   Antonieta Pert, MD  warfarin (COUMADIN) 5 MG tablet Take 2.5 mg by mouth See admin instructions. Take 2.5 mg by mouth with supper on Sun/Tues/Wed/Thurs/Sat and nothing on Mondays and Fridays    [provider]      Allergies    Wheat bran, Kiwi extract, Orange fruit [citrus], Phenergan [promethazine], Pineapple, Promethazine hcl, Tomato, Tylenol [acetaminophen], Vistaril [hydroxyzine hcl], Codeine, and Tape    Review of Systems   Review of Systems  Constitutional:  Negative for fever.  HENT:  Negative for sore throat.   Eyes:  Negative for visual disturbance.  Respiratory:  Negative for cough and shortness of breath.   Cardiovascular:  Negative for chest pain.  Gastrointestinal:  Negative for abdominal pain, diarrhea, nausea and vomiting.  Genitourinary:  Negative for dysuria.  Musculoskeletal:  Positive for falls and gait problem. Negative for neck pain.  Skin:  Negative for rash.  Neurological:  Positive for weakness. Negative for headaches.   Physical Exam Updated Vital Signs BP 116/82    Pulse (!) 124    Temp 97.7 F (36.5 C) (Oral)    Resp 18    SpO2 99%  Physical Exam Vitals and nursing note reviewed.  Constitutional:      General: She is not in acute distress.    Appearance: She is well-developed.  HENT:     Head: Normocephalic and atraumatic.  Eyes:     Conjunctiva/sclera: Conjunctivae normal.  Cardiovascular:     Rate and Rhythm: Tachycardia present. Rhythm irregular.     Heart sounds: No murmur heard. Pulmonary:     Effort:  Pulmonary effort is normal. No respiratory distress.     Breath sounds: Normal breath sounds.  Abdominal:     Palpations: Abdomen is soft.     Tenderness: There is no abdominal tenderness.  Musculoskeletal:        General: Tenderness present. No swelling.     Cervical back: Neck supple.     Comments: She has some bruising being on the lateral side of her right knee and an abrasion over the patella.  She has some skin tears of her right hand and wrist.  Skin:    General: Skin is warm and dry.     Capillary Refill: Capillary refill takes less than 2 seconds.  Neurological:     General: No focal deficit present.     Mental Status: She is alert.  Psychiatric:  Mood and Affect: Mood normal.    ED Results / Procedures / Treatments   Labs (all labs ordered are listed, but only abnormal results are displayed) Labs Reviewed  COMPREHENSIVE METABOLIC PANEL - Abnormal; Notable for the following components:      Result Value   Glucose, Bld 158 (*)    BUN 32 (*)    Calcium 8.2 (*)    Total Protein 5.8 (*)    Albumin 2.3 (*)    All other components within normal limits  PROTIME-INR - Abnormal; Notable for the following components:   Prothrombin Time 24.0 (*)    INR 2.2 (*)    All other components within normal limits  CBC WITH DIFFERENTIAL/PLATELET - Abnormal; Notable for the following components:   RBC 3.48 (*)    Hemoglobin 9.4 (*)    HCT 31.7 (*)    MCHC 29.7 (*)    RDW 18.3 (*)    Neutro Abs 9.0 (*)    Lymphs Abs 0.5 (*)    All other components within normal limits  URINALYSIS, ROUTINE W REFLEX MICROSCOPIC - Abnormal; Notable for the following components:   Color, Urine AMBER (*)    APPearance HAZY (*)    Protein, ur 30 (*)    Leukocytes,Ua TRACE (*)    Bacteria, UA FEW (*)    All other components within normal limits  BRAIN NATRIURETIC PEPTIDE - Abnormal; Notable for the following components:   B Natriuretic Peptide 283.1 (*)    All other components within normal  limits  TROPONIN I (HIGH SENSITIVITY) - Abnormal; Notable for the following components:   Troponin I (High Sensitivity) 18 (*)    All other components within normal limits  TROPONIN I (HIGH SENSITIVITY) - Abnormal; Notable for the following components:   Troponin I (High Sensitivity) 19 (*)    All other components within normal limits  RESP PANEL BY RT-PCR (FLU A&B, COVID) ARPGX2  LIPASE, BLOOD  BASIC METABOLIC PANEL  CBC    EKG EKG Interpretation  Date/Time:  Wednesday July 02 2021 11:59:21 EST Ventricular Rate:  120 PR Interval:    QRS Duration: 106 QT Interval:  316 QTC Calculation: 447 R Axis:   25 Text Interpretation: Atrial fibrillation Ventricular premature complex Low voltage, extremity and precordial leads RSR' in V1 or V2, right VCD or RVH Nonspecific T abnrm, anterolateral leads No significant change since last tracing Confirmed by Aletta Edouard [phone]) on 07/02/2021 12:10:33 PM  Radiology DG Chest Portable 1 View  Result Date: 06/30/2021 CLINICAL DATA:  Tachycardia. EXAM: PORTABLE CHEST 1 VIEW COMPARISON:  07/23/2020. FINDINGS: The heart is enlarged. Atherosclerotic calcification of the aorta is noted. Stable calcifications are noted at the mediastinum and hilar regions bilaterally, however the upper mediastinum and lung apices are not well seen due to overlapping structures. The pulmonary vasculature is not well seen. There is patchy airspace disease at the lung bases with small bilateral pleural effusions. No definite pneumothorax. No acute osseous abnormality. IMPRESSION: 1. Cardiomegaly. 2. Patchy airspace disease at the lung bases with small bilateral pleural effusions. 3. Aortic atherosclerosis. Electronically Signed   By: Brett Fairy M.D.   On: 06/30/2021 20:13    Procedures .Critical Care Performed by: Hayden Rasmussen, MD Authorized by: Hayden Rasmussen, MD   Critical care provider statement:    Critical care time (minutes):  45   Critical care time was  exclusive of:  Separately billable procedures and treating other patients   Critical care was necessary to treat  or prevent imminent or life-threatening deterioration of the following conditions:  Circulatory failure and cardiac failure   Critical care was time spent personally by me on the following activities:  Development of treatment plan with patient or surrogate, discussions with consultants, evaluation of patient's response to treatment, examination of patient, obtaining history from patient or surrogate, ordering and performing treatments and interventions, ordering and review of laboratory studies, ordering and review of radiographic studies, pulse oximetry, re-evaluation of patient's condition and review of old charts   I assumed direction of critical care for this patient from another provider in my specialty: no      Medications Ordered in ED Medications  traMADol (ULTRAM) tablet 50 mg (has no administration in time range)  pantoprazole (PROTONIX) EC tablet 40 mg (has no administration in time range)  methocarbamol (ROBAXIN) tablet 750 mg (has no administration in time range)  fluticasone furoate-vilanterol (BREO ELLIPTA) 100-25 MCG/INH 1 puff (has no administration in time range)  loratadine (CLARITIN) tablet 10 mg (has no administration in time range)  albuterol (VENTOLIN HFA) 108 (90 Base) MCG/ACT inhaler 2 puff (has no administration in time range)  diltiazem (CARDIZEM) 125 mg in dextrose 5% 125 mL (1 mg/mL) infusion (has no administration in time range)  ondansetron (ZOFRAN) tablet 4 mg (has no administration in time range)    Or  ondansetron (ZOFRAN) injection 4 mg (has no administration in time range)  hydrALAZINE (APRESOLINE) injection 5 mg (has no administration in time range)  sodium chloride flush (NS) 0.9 % injection 3 mL (has no administration in time range)  oxyCODONE (Oxy IR/ROXICODONE) immediate release tablet 5 mg (has no administration in time range)  docusate  sodium (COLACE) capsule 100 mg (has no administration in time range)  polyethylene glycol (MIRALAX / GLYCOLAX) packet 17 g (has no administration in time range)  bisacodyl (DULCOLAX) EC tablet 5 mg (has no administration in time range)  insulin aspart (novoLOG) injection 0-9 Units (has no administration in time range)  insulin aspart (novoLOG) injection 0-5 Units (has no administration in time range)  sodium chloride 0.9 % bolus 500 mL ( Intravenous Stopped 07/02/21 1315)  metoprolol tartrate (LOPRESSOR) injection 2.5 mg (2.5 mg Intravenous Given 07/02/21 1222)  furosemide (LASIX) injection 20 mg (20 mg Intravenous Given 07/02/21 1336)    ED Course/ Medical Decision Making/ A&P Clinical Course as of 07/02/21 1711  Wed Jul 02, 2021  1216 Sister is here now.  She says the home health aide was held this morning to check on her and found her heart rate to be elevated.  Sister feels like she is not doing very well at home and is concerned she is going into congestive heart failure. [MB]  1217 Last echo 2019 with an EF of 55 to 60%. [MB]  1223 Chest x-ray interpreted by me as small effusions no clear infiltrate. [MB]    Clinical Course User Index [MB] Hayden Rasmussen, MD                           Medical Decision Making  This patient presents to the ED for concern of rapid heart rate generalized weakness shortness of breath failure to thrive, this involves an extensive number of treatment options, and is a complaint that carries with it a high risk of complications and morbidity.  The differential diagnosis includes dehydration, fluid overload, A. fib, tachycardia, metabolic derangement, ACS, pneumonia, COVID, flu   Additional history obtained:  Additional history  obtained from patient's sister and EMS External records from outside source obtained and reviewed including prior ED and inpatient admission, cardiology notes   Lab Tests:  I Ordered, reviewed, and interpreted labs.  The pertinent  results include: CBC with normal white count, hemoglobin low stable from priors, chemistries with elevated glucose, urinalysis without clear signs of infection, INR therapeutic, BNP elevated no priors to compare with, COVID and flu negative   Imaging Studies ordered:  I ordered imaging studies including chest x-ray I independently visualized and interpreted imaging which showed no acute pneumonia I agree with the radiologist interpretation   Cardiac Monitoring:  The patient was maintained on a cardiac monitor.  I personally viewed and interpreted the cardiac monitored which showed an underlying rhythm of: A. fib with RVR   Medicines ordered and prescription drug management:  I ordered medication including IV Lopressor for rate control A. fib Reevaluation of the patient after these medicines showed that the patient stayed the same I have reviewed the patients home medicines and have made adjustments as needed   Critical Interventions:  Management of patient's A. fib with IV medications   Consultations Obtained:  I requested consultation with the [facility] hospitalist Dr. Lorin Mercy,  and discussed lab and imaging findings as well as pertinent plan - they recommend: Admission and hospice consult   Problem List / ED Course:     Reevaluation:  After the interventions noted above, I reevaluated the patient and found that they have :stayed the same   Dispostion:  After consideration of the diagnostic results and the patients response to treatment feel that the patent would benefit from admission to the hospital for further management of her atrial fibrillation and possible placement.  She is very frail and it does not sound like her sister is able to manage her at home..          Final Clinical Impression(s) / ED Diagnoses Final diagnoses:  Atrial fibrillation with rapid ventricular response ([place])  Generalized weakness    Rx / DC Orders ED Discharge Orders     None          Hayden Rasmussen, MD 07/02/21 1715

## 2021-07-02 NOTE — Progress Notes (Addendum)
ANTICOAGULATION CONSULT NOTE - Initial Consult  Pharmacy Consult for warfarin Indication: atrial fibrillation  Allergies  Allergen Reactions   Wheat Bran Shortness Of Breath   Kiwi Extract Other (See Comments)    Causes mouth sores   Orange Fruit [Citrus] Other (See Comments)    Causes mouth sores   Phenergan [Promethazine] Other (See Comments)    Disorientation    Pineapple Other (See Comments)    Causes mouth sores    Promethazine Hcl Other (See Comments)    Disorientation    Tomato Other (See Comments)    Causes mouth sores   Tylenol [Acetaminophen] Other (See Comments)    Causes fast heart rate   Vistaril [Hydroxyzine Hcl] Other (See Comments)    Confusion    Codeine Itching and Rash   Tape Rash and Other (See Comments)    Blisters on skin    Vital Signs: Temp: 97.7 F (36.5 C) (01/04 1421) Temp Source: Oral (01/04 1421) BP: 116/82 (01/04 1500) Pulse Rate: 124 (01/04 1500)  Labs: Recent Labs    06/30/21 1755 07/02/21 1155 07/02/21 1400  HGB 9.1* 9.4*  --   HCT 30.8* 31.7*  --   PLT 292 329  --   LABPROT 29.1* 24.0*  --   INR 2.8* 2.2*  --   CREATININE 0.50 0.63  --   TROPONINIHS  --  18* 19*    Estimated Creatinine Clearance: 50.9 mL/min (by C-G formula based on SCr of 0.63 mg/dL).  Assessment: 51 YOF admitted with complaints of weakness. PMH significant for Afib with RVR anticoagulated with warfarin. Last dose taken 07-01-21. Pharmacy asked to dose warfarin.   Home regimen: 2.5 mg Sun-Tues-Wed-Thur-Sat; None Mon and Fri  INR today 2.2 and therapeutic. No signs of bleeding noted. Will continue home regimen.   Goal of Therapy:  INR 2-3 Monitor platelets by anticoagulation protocol: Yes   Plan:  Warfarin 2.5 mg x 1 Daily INR Monitor for signs and symptoms of bleeding  Thank you for involving pharmacy in this patient's care.  Elita Quick, PharmD PGY1 Ambulatory Care Pharmacy Resident 07/02/2021 6:13 PM  **Pharmacist phone directory can  be found on [email or web address] listed under [place]**

## 2021-07-02 NOTE — Assessment & Plan Note (Signed)
-  GOC was discussed with her sister -The patient just wants to go home - but cannot support herself well enough at home to do so -SNF rehab is reasonable for now, but she may not have enough rehab potential to return home -Palliative care consulted

## 2021-07-02 NOTE — H&P (Signed)
History and Physical    Patient: Casey Patel WCB:[id number] DOB: 05-13-1930 DOA: 07/02/2021 DOS: the patient was seen and examined on 07/02/2021 PCP: Tisovec, Fransico Him, MD  Patient coming from: Home - lives with sister and her husband; NOK: sister, Janalyn Shy, [phone]  Chief Complaint: weakness  HPI: Casey Patel is a 88 y.o. female with medical history significant of atrial fibrillation on AC; DM; HTN; and HLD presenting with afib and weakness.  She reports that she is ready to go home and otherwise did not give significant history.    Her sister was not present at the time of my initial visit but returned to the hospital and provided history.  She was diagnosed with a "terrible UTI" and admitted to the hospital from 12/27-30; urine culture showed contamination and she was discharged on Keflex.  She has been falling a lot recently.  She was set to be discharged to SNF but her sister didn't like the bed offers and so declined and instead she was sent home with a hospital bed.  She returned to the ER on 1/2 with afib with tachycardia; cardiology recommended changing her Toprol from 150 mg daily to 100 mg BID and was discharged.  Since being home, she has had persistently elevated HR and also is unable to significantly participate in her ADLs.  Her sister is unable to care for her in this condition.  Her sister requests SNF rehab at either [facility] or [facility].      ER Course:  Admitted a week ago with afib with RVR and AMS.  Returned on 1/2 with the same and now back with the same.  O2 increased to 4L.  Needs placement.     Review of Systems: Unable to review all systems due to lack of cooperation from patient. Past Medical History:  Diagnosis Date   AF (atrial fibrillation) (HCC)    Arthritis    Asthma    Diabetes mellitus    TYPE 2   Hyperlipidemia    Hypertension    PNA (pneumonia)    Past Surgical History:  Procedure Laterality Date   ABDOMINAL HYSTERECTOMY  1976    BACK SURGERY     lumb x 3   CARDIOVERSION  12/07/2011   Procedure: CARDIOVERSION;  Surgeon: Peter M Martinique, MD;  Location: [place];  Service: Cardiovascular;  Laterality: N/A;   CARPAL TUNNEL RELEASE  09/15/2011   Procedure: CARPAL TUNNEL RELEASE;  Surgeon: Wynonia Sours, MD;  Location: [place];  Service: Orthopedics;  Laterality: Right;   CATARACT EXTRACTION     CHOLECYSTECTOMY  2003   HAND TENDON SURGERY  1999   left wrist - thumb   LAMINECTOMY  1999   with fusion (L1-5)   LAMINECTOMY  19995   with fusion (L3-4)   TONSILLECTOMY  1952   Social History:  reports that she quit smoking about 43 years ago. Her smoking use included cigarettes. She has a 16.00 pack-year smoking history. She has never used smokeless tobacco. She reports that she does not drink alcohol and does not use drugs.  Allergies  Allergen Reactions   Wheat Bran Shortness Of Breath   Kiwi Extract Other (See Comments)    Causes mouth sores   Orange Fruit [Citrus] Other (See Comments)    Causes mouth sores   Phenergan [Promethazine] Other (See Comments)    Disorientation    Pineapple Other (See Comments)    Causes mouth sores    Promethazine Hcl Other (See Comments)  Disorientation    Tomato Other (See Comments)    Causes mouth sores   Tylenol [Acetaminophen] Other (See Comments)    Causes fast heart rate   Vistaril [Hydroxyzine Hcl] Other (See Comments)    Confusion    Codeine Itching and Rash   Tape Rash and Other (See Comments)    Blisters on skin    Family History  Problem Relation Age of Onset   Hypertension Mother 79   Stroke Mother 88   Arthritis Mother 4   Heart attack Father 56   Heart disease Father 27   Hypertension Father 18   Ulcers Father 85   Breast cancer Sister    Heart disease Brother        CONGENTIAL HEART DISEASE   Heart disease Sister    Diabetes Sister    Breast cancer Sister     Prior to Admission medications   Medication Sig Start Date End Date  Taking? Authorizing Provider  Ascorbic Acid (VITAMIN C) 1000 MG tablet Take 1,000 mg by mouth daily.    [provider]  bacitracin ointment Apply topically 2 (two) times daily for 7 days. Apply to right wrist Patient taking differently: Apply 1 application topically See admin instructions. Apply to right wrist 2 times a day for 7 days 06/27/21 07/04/21  Antonieta Pert, MD  Cholecalciferol (VITAMIN D3) 50 MCG (2000 UT) TABS Take 2,000 Units by mouth daily.    [provider]  docusate sodium (COLACE) 100 MG capsule Take 1 capsule (100 mg total) by mouth daily as needed for mild constipation. Patient taking differently: Take 100 mg by mouth daily. 03/11/18   Patrecia Pour, MD  feeding supplement, GLUCERNA SHAKE, (GLUCERNA SHAKE) LIQD Take 237 mLs by mouth 2 (two) times daily between meals.    [provider]  fluticasone furoate-vilanterol (BREO ELLIPTA) 100-25 MCG/INH AEPB Inhale 1 puff into the lungs at bedtime.    [provider]  loratadine (CLARITIN) 10 MG tablet Take 10 mg by mouth daily.    [provider]  metFORMIN (GLUCOPHAGE) 500 MG tablet Take 500 mg by mouth 2 (two) times daily with a meal.  02/06/14   [provider]  methocarbamol (ROBAXIN) 750 MG tablet Take 750 mg by mouth 3 (three) times daily.     [provider]  metoprolol (TOPROL-XL) 100 MG 24 hr tablet Take 150 mg by mouth at bedtime.    [provider]  metoprolol succinate (TOPROL-XL) 100 MG 24 hr tablet Take 1 tablet (100 mg total) by mouth in the morning and at bedtime for 14 days. 07/01/21 07/15/21  Jeanell Sparrow, DO  Multiple Vitamin (MULTIVITAMIN WITH MINERALS) TABS tablet Take 1 tablet by mouth daily. 06/28/21 07/28/21  Antonieta Pert, MD  nutrition supplement, Fanny Dance, (JUVEN) PACK Take 1 packet by mouth 2 (two) times daily with a meal. Patient not taking: Reported on 06/30/2021 06/27/21   Antonieta Pert, MD  omeprazole (PRILOSEC) 20 MG capsule Take 20 mg by mouth daily  before breakfast.    [provider]  PROAIR HFA 108 (90 Base) MCG/ACT inhaler Inhale 2 puffs into the lungs every 6 (six) hours as needed for wheezing or shortness of breath.    [provider]  traMADol (ULTRAM) 50 MG tablet Take 1 tablet (50 mg total) by mouth every 6 (six) hours as needed for up to 4 doses for moderate pain (pain). Patient taking differently: Take 50 mg by mouth every 6 (six) hours as needed for  moderate pain. 06/27/21   Antonieta Pert, MD  warfarin (COUMADIN) 5 MG tablet Take 2.5 mg by mouth See admin instructions. Take 2.5 mg by mouth with supper on Sun/Tues/Wed/Thurs/Sat and nothing on Mondays and Fridays    [provider]    Physical Exam: Vitals:   07/02/21 1421 07/02/21 1430 07/02/21 1445 07/02/21 1500  BP:  (!) 111/91 110/74 116/82  Pulse:  (!) 120 (!) 127 (!) 124  Resp:  (!) 21 (!) 24 18  Temp: 97.7 F (36.5 C)     TempSrc: Oral     SpO2:  99% 99% 99%   General:  Appears frail and fragile; she is so kyphotic that she has pressure ulcers on her upper chest from the weight of her chin Eyes:  EOMI, normal lids, iris ENT:  grossly normal hearing, lips & tongue, mmm Neck:  no LAD, masses or thyromegaly Cardiovascular:  Irregularly irregular with tachycardia to 120s, no m/r/g. No LE edema.  Respiratory:   CTA bilaterally with no wheezes/rales/rhonchi.  Mildly increased respiratory effort. Abdomen:  soft, NT, ND Skin:  no rash or induration seen on limited exam; pressure ulcers on upper chest, as noted Musculoskeletal:  decreased tone BUE/BLE, no bony abnormality Psychiatric:  blunted mood and affect, speech sparse but appropriate Neurologic:  unable to effectively perform   Radiological Exams on Admission: Independently reviewed - see discussion in A/P where applicable  DG Chest Port 1 View  Result Date: 07/02/2021 CLINICAL DATA:  Weakness EXAM: PORTABLE CHEST 1 VIEW COMPARISON:  06/30/2021 FINDINGS: Cardiac enlargement.  Negative for  heart failure. Mild bibasilar airspace disease and small bilateral effusions unchanged. Underlying chronic lung disease. IMPRESSION: No interval change. Bibasilar airspace disease with small bilateral pleural effusions. Electronically Signed   By: Franchot Gallo M.D.   On: 07/02/2021 11:55   DG Chest Portable 1 View  Result Date: 06/30/2021 CLINICAL DATA:  Tachycardia. EXAM: PORTABLE CHEST 1 VIEW COMPARISON:  07/23/2020. FINDINGS: The heart is enlarged. Atherosclerotic calcification of the aorta is noted. Stable calcifications are noted at the mediastinum and hilar regions bilaterally, however the upper mediastinum and lung apices are not well seen due to overlapping structures. The pulmonary vasculature is not well seen. There is patchy airspace disease at the lung bases with small bilateral pleural effusions. No definite pneumothorax. No acute osseous abnormality. IMPRESSION: 1. Cardiomegaly. 2. Patchy airspace disease at the lung bases with small bilateral pleural effusions. 3. Aortic atherosclerosis. Electronically Signed   By: Brett Fairy M.D.   On: 06/30/2021 20:13    EKG: Independently reviewed.  Atrial fibrillation with rate 120; low voltage with NSCSLT   Labs on Admission: I have personally reviewed the available labs and imaging studies at the time of the admission.  Pertinent labs:    Glucose 158 BUN 32/Creatinine 0.63/GFR >60 Albumin 2.3 BNP 283.1 HS troponin 18, 19 WNC 10.3 Hgb 9.4 INR 2.2 COVID/flu negative UA: trace LE, 30 protein    Assessment/Plan * Atrial fibrillation with RVR (HCC)- (present on admission) -Patient with known afib presenting with persistent tachycardia -On previous ER evaluation, Toprol XL was increased from 150 mg daily to 100 mg BID without apparent improvement  -Will admit to SDU for Diltiazem drip as per protocol with plan to transition to PO Diltiazem once heart rate is controlled (resting HR 110 or lower -HS troponin negative x 2 -Consider  cardiology consult - but will consult palliative care first. -Continue Coumadin but she is such a high fall risk that this needs  to be considered.  HTN -Takes Toprol XL monotherapy at home -Patient with borderline low BP while in the ER -Will hold Toprol and start Diltiazem -Will also add prn hydralazine  HLD -She does not appear to be taking medications for this issue at this time  DM -Last A1c was 6.0 -Hold Glucophage -Will cover with sensitive-scale SSI for now -Generally glycemic goals for her are to avoid hypoglycemia > severe hyperglycemia  Goals of care, counseling/discussion -GOC was discussed with her sister -The patient just wants to go home - but cannot support herself well enough at home to do so -SNF rehab is reasonable for now, but she may not have enough rehab potential to return home -Palliative care consulted    Advance Care Planning:   Code Status: DNR   Consults: Palliative care; [facility] team; PT/OT  Family Communication: Sister was present after initial evaluation  Severity of Illness: The appropriate patient status for this patient is INPATIENT. Inpatient status is judged to be reasonable and necessary in order to provide the required intensity of service to ensure the patient's safety. The patient's presenting symptoms, physical exam findings, and initial radiographic and laboratory data in the context of their chronic comorbidities is felt to place them at high risk for further clinical deterioration. Furthermore, it is not anticipated that the patient will be medically stable for discharge from the hospital within 2 midnights of admission.   * I certify that at the point of admission it is my clinical judgment that the patient will require inpatient hospital care spanning beyond 2 midnights from the point of admission due to high intensity of service, high risk for further deterioration and high frequency of surveillance required.*  Author: Karmen Bongo,  MD 07/02/2021 5:03 PM  For on call review [email or web address].

## 2021-07-03 ENCOUNTER — Inpatient Hospital Stay (HOSPITAL_COMMUNITY): Payer: Medicare Other

## 2021-07-03 DIAGNOSIS — R5381 Other malaise: Secondary | ICD-10-CM | POA: Diagnosis not present

## 2021-07-03 DIAGNOSIS — I4891 Unspecified atrial fibrillation: Secondary | ICD-10-CM | POA: Diagnosis not present

## 2021-07-03 DIAGNOSIS — R531 Weakness: Secondary | ICD-10-CM | POA: Diagnosis not present

## 2021-07-03 DIAGNOSIS — Z66 Do not resuscitate: Secondary | ICD-10-CM

## 2021-07-03 DIAGNOSIS — Z7189 Other specified counseling: Secondary | ICD-10-CM | POA: Diagnosis not present

## 2021-07-03 LAB — CBC
HCT: 32.3 % — ABNORMAL LOW (ref 36.0–46.0)
Hemoglobin: 9.5 g/dL — ABNORMAL LOW (ref 12.0–15.0)
MCH: 26.6 pg (ref 26.0–34.0)
MCHC: 29.4 g/dL — ABNORMAL LOW (ref 30.0–36.0)
MCV: 90.5 fL (ref 80.0–100.0)
Platelets: 331 K/uL (ref 150–400)
RBC: 3.57 MIL/uL — ABNORMAL LOW (ref 3.87–5.11)
RDW: 18.1 % — ABNORMAL HIGH (ref 11.5–15.5)
WBC: 9 K/uL (ref 4.0–10.5)
nRBC: 0 % (ref 0.0–0.2)

## 2021-07-03 LAB — BASIC METABOLIC PANEL WITH GFR
Anion gap: 6 (ref 5–15)
BUN: 31 mg/dL — ABNORMAL HIGH (ref 8–23)
CO2: 32 mmol/L (ref 22–32)
Calcium: 8.3 mg/dL — ABNORMAL LOW (ref 8.9–10.3)
Chloride: 99 mmol/L (ref 98–111)
Creatinine, Ser: 0.61 mg/dL (ref 0.44–1.00)
GFR, Estimated: >60 mL/min (ref >60)
Glucose, Bld: 130 mg/dL — ABNORMAL HIGH (ref 70–99)
Potassium: 4.3 mmol/L (ref 3.5–5.1)
Sodium: 137 mmol/L (ref 135–145)

## 2021-07-03 LAB — CBG MONITORING, ED
Glucose-Capillary: 128 mg/dL — ABNORMAL HIGH (ref 70–99)
Glucose-Capillary: 162 mg/dL — ABNORMAL HIGH (ref 70–99)

## 2021-07-03 LAB — TSH: TSH: 1.097 u[IU]/mL (ref 0.350–4.500)

## 2021-07-03 LAB — GLUCOSE, CAPILLARY
Glucose-Capillary: 125 mg/dL — ABNORMAL HIGH (ref 70–99)
Glucose-Capillary: 144 mg/dL — ABNORMAL HIGH (ref 70–99)
Glucose-Capillary: 132 mg/dL — ABNORMAL HIGH (ref 70–99)

## 2021-07-03 LAB — PROTIME-INR
INR: 2.2 — ABNORMAL HIGH (ref 0.8–1.2)
Prothrombin Time: 24.5 s — ABNORMAL HIGH (ref 11.4–15.2)

## 2021-07-03 MED ORDER — WARFARIN SODIUM 2.5 MG PO TABS
2.5000 mg | ORAL_TABLET | Freq: Once | ORAL | Status: AC
Start: 2021-07-03 — End: 2021-07-03
  Administered 2021-07-03: 2.5 mg via ORAL
  Filled 2021-07-03 (×2): qty 1

## 2021-07-03 MED ORDER — NYSTATIN 100000 UNIT/GM EX POWD
1.0000 "application " | Freq: Two times a day (BID) | CUTANEOUS | Status: DC
  Administered 2021-07-03 – 2021-07-08 (×10): 1 via TOPICAL
  Filled 2021-07-03: qty 15

## 2021-07-03 MED ORDER — METOPROLOL SUCCINATE ER 100 MG PO TB24
100.0000 mg | ORAL_TABLET | Freq: Two times a day (BID) | ORAL | Status: DC
  Administered 2021-07-03 – 2021-07-08 (×10): 100 mg via ORAL
  Filled 2021-07-03 (×9): qty 1
  Filled 2021-07-03: qty 4
  Filled 2021-07-03: qty 1

## 2021-07-03 NOTE — Progress Notes (Addendum)
PROGRESS NOTE    Casey Patel  KWI:[id number] DOB: [birth year] DOA: 07/02/2021 PCP: [name], MD  Brief Narrative: 89/F with general debility, hypertension, diabetes mellitus, history of atrial fibrillation recently hospitalized with UTI, sepsis, on 12/30, family declined SNF options and went home instead, came back to the ER on 1/2 with A. fib RVR, cardiology recommended changing her Toprol 1 from 150 mg daily to 100 mg twice daily. Subsequently patient has been weaker than usual and less able to participate in ADLs, heart rate has been persistently elevated and was brought back to the ER  Assessment & Plan:   Atrial fibrillation with RVR ([place])- (present on admission) -Long history of A. fib presenting with persistent A. fib RVR  -Recently Toprol XL dose was increased to 100 mg twice daily  -Continue Cardizem drip today, restart Toprol 100 mg twice daily and attempt to wean off drip as tolerated  -Repeat 2D echo, to assess RV, RA -Also check TSH -Coumadin continued per pharmacy -Palliative consulted, may be appropriate for home hospice services -PT/OT, Toc consult   HTN -BP soft but stable, restarting Toprol today, attempt to wean off Cardizem drip as tolerated   DM -Last A1c was 6.0 -Glucophage on hold, continue sensitive sliding scale   Goals of care, counseling/discussion -No family at bedside, Will contact Sister, she is DNR -Palliative consulted for goals of care discussions   General debility, failure to thrive   DVT prophylaxis: Coumadin Code Status: DNR Family Communication: No family at bedside, will contact sister Disposition Plan:  Status is: Inpatient  Remains inpatient appropriate because: Severity of illness  Consultants:    Procedures:   Antimicrobials:    Subjective: -Feels okay, denies any specific complaints, remains on Cardizem drip  Objective: Vitals:   07/03/21 0645 07/03/21 0730 07/03/21 0815 07/03/21 0945  BP: 124/90 115/81 116/78  119/89  Pulse: (!) 121 (!) 105 (!) 116 (!) 123  Resp: 15 (!) 21 (!) 21 (!) 23  Temp:      TempSrc:      SpO2: 97% 98% 96% 96%    Intake/Output Summary (Last 24 hours) at 07/03/2021 1044 Last data filed at 07/02/2021 1331 Gross per 24 hour  Intake 553.12 ml  Output --  Net 553.12 ml   There were no vitals filed for this visit.  Examination:  General exam: Elderly chronically ill female laying in bed, awake alert oriented to self and partly to place only, cognitive deficits noted HEENT: No JVD CVS: S1-S2, irregularly irregular rhythm, tachycardic Lungs: Poor air movement bilaterally otherwise clear Abdomen: Soft, nontender, bowel sounds present Extremities: No edema Psychiatry: Flat affect  Data Reviewed:   CBC: Recent Labs  Lab 06/27/21 0557 06/30/21 1755 07/02/21 1155 07/03/21 0241  WBC 7.4 7.6 10.3 9.0  NEUTROABS  --   --  9.0*  --   HGB 8.5* 9.1* 9.4* 9.5*  HCT 27.6* 30.8* 31.7* 32.3*  MCV 87.6 89.3 91.1 90.5  PLT 190 292 329 242   Basic Metabolic Panel: Recent Labs  Lab 06/27/21 0557 06/30/21 1755 07/02/21 1155 07/03/21 0241  NA 134* 136 139 137  K 3.9 4.9 4.9 4.3  CL 100 100 102 99  CO2 29 30 29  32  GLUCOSE 138* 149* 158* 130*  BUN 31* 29* 32* 31*  CREATININE 0.57 0.50 0.63 0.61  CALCIUM 8.1* 8.3* 8.2* 8.3*  MG  --  1.8  --   --    GFR: Estimated Creatinine Clearance: 50.9 mL/min (by C-G formula based  on SCr of 0.61 mg/dL). Liver Function Tests: Recent Labs  Lab 07/02/21 1155  AST 28  ALT 19  ALKPHOS 68  BILITOT 0.7  PROT 5.8*  ALBUMIN 2.3*   Recent Labs  Lab 07/02/21 1155  LIPASE 20   No results for input(s): AMMONIA in the last 168 hours. Coagulation Profile: Recent Labs  Lab 06/27/21 0557 06/28/21 0642 06/30/21 1755 07/02/21 1155  INR 2.1* 2.1* 2.8* 2.2*   Cardiac Enzymes: No results for input(s): CKTOTAL, CKMB, CKMBINDEX, TROPONINI in the last 168 hours. BNP (last 3 results) No results for input(s): PROBNP in the last 8760  hours. HbA1C: No results for input(s): HGBA1C in the last 72 hours. CBG: Recent Labs  Lab 07/02/21 1918 07/02/21 2146 07/03/21 0812  GLUCAP 138* 142* 128*   Lipid Profile: No results for input(s): CHOL, HDL, LDLCALC, TRIG, CHOLHDL, LDLDIRECT in the last 72 hours. Thyroid Function Tests: No results for input(s): TSH, T4TOTAL, FREET4, T3FREE, THYROIDAB in the last 72 hours. Anemia Panel: No results for input(s): VITAMINB12, FOLATE, FERRITIN, TIBC, IRON, RETICCTPCT in the last 72 hours. Urine analysis:    Component Value Date/Time   COLORURINE AMBER (A) 07/02/2021 1400   APPEARANCEUR HAZY (A) 07/02/2021 1400   LABSPEC 1.028 07/02/2021 1400   PHURINE 5.0 07/02/2021 1400   GLUCOSEU NEGATIVE 07/02/2021 1400   HGBUR NEGATIVE 07/02/2021 1400   BILIRUBINUR NEGATIVE 07/02/2021 1400   KETONESUR NEGATIVE 07/02/2021 1400   PROTEINUR 30 (A) 07/02/2021 1400   NITRITE NEGATIVE 07/02/2021 1400   LEUKOCYTESUR TRACE (A) 07/02/2021 1400   Sepsis Labs: @LABRCNTIP (procalcitonin:4,lacticidven:4)  ) Recent Results (from the past 240 hour(s))  Resp Panel by RT-PCR (Flu A&B, Covid) Nasopharyngeal Swab     Status: None   Collection Time: 06/24/21  3:14 PM   Specimen: Nasopharyngeal Swab; Nasopharyngeal(NP) swabs in vial transport medium  Result Value Ref Range Status   SARS Coronavirus 2 by RT PCR NEGATIVE NEGATIVE Final    Comment: (NOTE) SARS-CoV-2 target nucleic acids are NOT DETECTED.  The SARS-CoV-2 RNA is generally detectable in upper respiratory specimens during the acute phase of infection. The lowest concentration of SARS-CoV-2 viral copies this assay can detect is 138 copies/mL. A negative result does not preclude SARS-Cov-2 infection and should not be used as the sole basis for treatment or other patient management decisions. A negative result may occur with  improper specimen collection/handling, submission of specimen other than nasopharyngeal swab, presence of viral mutation(s)  within the areas targeted by this assay, and inadequate number of viral copies(<138 copies/mL). A negative result must be combined with clinical observations, patient history, and epidemiological information. The expected result is Negative.  Fact Sheet for Patients:  EntrepreneurPulse.com.au  Fact Sheet for Healthcare Providers:  IncredibleEmployment.be  This test is no t yet approved or cleared by the [place] FDA and  has been authorized for detection and/or diagnosis of SARS-CoV-2 by FDA under an Emergency Use Authorization (EUA). This EUA will remain  in effect (meaning this test can be used) for the duration of the COVID-19 declaration under Section 564(b)(1) of the Act, 21 U.S.C.section 360bbb-3(b)(1), unless the authorization is terminated  or revoked sooner.       Influenza A by PCR NEGATIVE NEGATIVE Final   Influenza B by PCR NEGATIVE NEGATIVE Final    Comment: (NOTE) The Xpert Xpress SARS-CoV-2/FLU/RSV plus assay is intended as an aid in the diagnosis of influenza from Nasopharyngeal swab specimens and should not be used as a sole basis for treatment. Nasal washings  and aspirates are unacceptable for Xpert Xpress SARS-CoV-2/FLU/RSV testing.  Fact Sheet for Patients: EntrepreneurPulse.com.au  Fact Sheet for Healthcare Providers: IncredibleEmployment.be  This test is not yet approved or cleared by the [place] FDA and has been authorized for detection and/or diagnosis of SARS-CoV-2 by FDA under an Emergency Use Authorization (EUA). This EUA will remain in effect (meaning this test can be used) for the duration of the COVID-19 declaration under Section 564(b)(1) of the Act, 21 U.S.C. section 360bbb-3(b)(1), unless the authorization is terminated or revoked.  Performed at [facility], [place] [address], [place], [place] [address]   Urine Culture     Status: Abnormal    Collection Time: 06/24/21  3:30 PM   Specimen: Urine, Catheterized  Result Value Ref Range Status   Specimen Description   Final    URINE, CATHETERIZED Performed at [place] [address], [place], [place] [address]    Special Requests   Final    NONE Performed at [facility], [place] [address], [place], [place] [address]    Culture MULTIPLE SPECIES PRESENT, SUGGEST RECOLLECTION (A)  Final   Report Status 06/25/2021 FINAL  Final  Urine Culture     Status: Abnormal   Collection Time: 06/26/21  8:50 AM   Specimen: Urine, Clean Catch  Result Value Ref Range Status   Specimen Description   Final    URINE, CLEAN CATCH Performed at [facility], [place] [address] [place], [place] [address]    Special Requests   Final    NONE Performed at [facility], [place] [address] [place], [place] [address]    Culture (A)  Final    <10,000 COLONIES/mL INSIGNIFICANT GROWTH Performed at [place] [address] [place], [place] [address]    Report Status 06/27/2021 FINAL  Final  SARS CORONAVIRUS 2 (TAT 6-24 HRS) Nasopharyngeal Nasopharyngeal Swab     Status: None   Collection Time: 06/27/21  9:35 AM   Specimen: Nasopharyngeal Swab  Result Value Ref Range Status   SARS Coronavirus 2 NEGATIVE NEGATIVE Final    Comment: (NOTE) SARS-CoV-2 target nucleic acids are NOT DETECTED.  The SARS-CoV-2 RNA is generally detectable in upper and lower respiratory specimens during the acute phase of infection. Negative results do not preclude SARS-CoV-2 infection, do not rule out co-infections with other pathogens, and should not be used as the sole basis for treatment or other patient management decisions. Negative results must be combined with clinical observations, patient history, and epidemiological information. The expected result is Negative.  Fact Sheet for  Patients: SugarRoll.be  Fact Sheet for Healthcare Providers: https://www.woods-mathews.com/  This test is not yet approved or cleared by the [place] FDA and  has been authorized for detection and/or diagnosis of SARS-CoV-2 by FDA under an Emergency Use Authorization (EUA). This EUA will remain  in effect (meaning this test can be used) for the duration of the COVID-19 declaration under Se ction 564(b)(1) of the Act, 21 U.S.C. section 360bbb-3(b)(1), unless the authorization is terminated or revoked sooner.  Performed at [facility], [place] [address] [place], [place] [address]   Urine Culture     Status: None   Collection Time: 06/30/21  1:30 PM   Specimen: In/Out Cath Urine  Result Value Ref Range Status   Specimen Description   Final    IN/OUT CATH URINE Performed at [facility], [address] [place], [place] [address]  Special Requests   Final    NONE Performed at [facility], [address], [place], [place] [address]    Culture   Final    NO GROWTH Performed at [facility], [place] [address] [place], [place] [address]    Report Status 07/02/2021 FINAL  Final  Resp Panel by RT-PCR (Flu A&B, Covid) Nasopharyngeal Swab     Status: None   Collection Time: 07/02/21 11:35 AM   Specimen: Nasopharyngeal Swab; Nasopharyngeal(NP) swabs in vial transport medium  Result Value Ref Range Status   SARS Coronavirus 2 by RT PCR NEGATIVE NEGATIVE Final    Comment: (NOTE) SARS-CoV-2 target nucleic acids are NOT DETECTED.  The SARS-CoV-2 RNA is generally detectable in upper respiratory specimens during the acute phase of infection. The lowest concentration of SARS-CoV-2 viral copies this assay can detect is 138 copies/mL. A negative result does not preclude SARS-Cov-2 infection and should not be used as the sole basis for treatment or other patient management decisions. A negative result may occur with  improper specimen  collection/handling, submission of specimen other than nasopharyngeal swab, presence of viral mutation(s) within the areas targeted by this assay, and inadequate number of viral copies(<138 copies/mL). A negative result must be combined with clinical observations, patient history, and epidemiological information. The expected result is Negative.  Fact Sheet for Patients:  EntrepreneurPulse.com.au  Fact Sheet for Healthcare Providers:  IncredibleEmployment.be  This test is no t yet approved or cleared by the [place] FDA and  has been authorized for detection and/or diagnosis of SARS-CoV-2 by FDA under an Emergency Use Authorization (EUA). This EUA will remain  in effect (meaning this test can be used) for the duration of the COVID-19 declaration under Section 564(b)(1) of the Act, 21 U.S.C.section 360bbb-3(b)(1), unless the authorization is terminated  or revoked sooner.       Influenza A by PCR NEGATIVE NEGATIVE Final   Influenza B by PCR NEGATIVE NEGATIVE Final    Comment: (NOTE) The Xpert Xpress SARS-CoV-2/FLU/RSV plus assay is intended as an aid in the diagnosis of influenza from Nasopharyngeal swab specimens and should not be used as a sole basis for treatment. Nasal washings and aspirates are unacceptable for Xpert Xpress SARS-CoV-2/FLU/RSV testing.  Fact Sheet for Patients: EntrepreneurPulse.com.au  Fact Sheet for Healthcare Providers: IncredibleEmployment.be  This test is not yet approved or cleared by the [place] FDA and has been authorized for detection and/or diagnosis of SARS-CoV-2 by FDA under an Emergency Use Authorization (EUA). This EUA will remain in effect (meaning this test can be used) for the duration of the COVID-19 declaration under Section 564(b)(1) of the Act, 21 U.S.C. section 360bbb-3(b)(1), unless the authorization is terminated or revoked.  Performed at [facility], [place] [address] [place], [place] [address]      Radiology Studies: DG Chest Port 1 View  Result Date: 07/02/2021 CLINICAL DATA:  Weakness EXAM: PORTABLE CHEST 1 VIEW COMPARISON:  06/30/2021 FINDINGS: Cardiac enlargement.  Negative for heart failure. Mild bibasilar airspace disease and small bilateral effusions unchanged. Underlying chronic lung disease. IMPRESSION: No interval change. Bibasilar airspace disease with small bilateral pleural effusions. Electronically Signed   By: Franchot Gallo M.D.   On: 07/02/2021 11:55    Scheduled Meds:  docusate sodium  100 mg Oral BID   fluticasone furoate-vilanterol  1 puff Inhalation QHS   insulin aspart  0-5 Units Subcutaneous QHS   insulin aspart  0-9 Units Subcutaneous TID WC   loratadine  10  mg Oral Daily   methocarbamol  750 mg Oral TID   metoprolol succinate  100 mg Oral BID   pantoprazole  40 mg Oral Daily   sodium chloride flush  3 mL Intravenous Q12H   Warfarin - Pharmacist Dosing Inpatient   Does not apply q1600   Continuous Infusions:  diltiazem (CARDIZEM) infusion 9.2 mg/hr (07/03/21 0129)     LOS: 1 day    Time spent: 33min  Domenic Polite, MD [facility]   07/03/2021, 10:44 AM

## 2021-07-03 NOTE — Consult Note (Addendum)
Consultation Note Date: 07/03/2021   Patient Name: Casey Patel  DOB: [birth year]  MRN: [id number]  Age / Sex: 88 y.o., female  PCP: Tisovec, Fransico Him, MD Referring Physician: Domenic Polite, MD  Reason for Consultation: Establishing goals of care  HPI/Patient Profile: 88 y.o. female  with past medical history of atrial fibrillation, hypertension, diabetes mellitus, and general debility. She was recently hospitalized at [facility] 12/27 - 12/31 with sepsis and UTI. Family declined SNF options and patient discharged home on 12/31. She came back to the ED on 06/30/21 with atrial fibrillation with RVR; cardiology recommended changing Toprol from 150 mg daily to 100 mg twice daily.  She presented back to the ED on 07/02/2021 with atrial fibrillation and weakness. Admitted to [facility].   Clinical Assessment and Goals of Care: I have reviewed medical records including EPIC notes, labs and imaging, and went to see patient in the ED to discuss diagnosis, prognosis, GOC, EOL wishes, disposition, and options. Her sister Vaughan Basta is at bedside and is feeding her lunch. Cardizem infusion is off. Patient/sister with no acute complaints.   I introduced Palliative Medicine as specialized medical care for people living with serious illness. It focuses on providing relief from the symptoms and stress of a serious illness. Patient is alert and will answer simple questions but is minimally participative in discussion.   We discussed a brief life review of the patient. [name] is originally from [place] but lived much of her adult life near [place], [place]. 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 She is widowed. She had 1 daughter, who unfortunately passed away in 2016/09/12 from ovarian cancer.   Asma relocated from [place] to [place] about 11 years ago to live with Vaughan Basta. As far as functional status, there has been  significant and rapid decline. Patient suffered a fall on 12/26 and was admitted to the hospital with UTI the following day. Prior to 12/26, she was ambulatory with a walker for short distances.   Currently patient is non-ambulatory and is unable to assist with turning/repositioning.  At a minimum, Vaughan Basta needs her to be able to "roll side to side" so she can change and care for her in the bed. She is requesting placement in SNF for rehab. We discussed that the goal of SNF/rehab is improvement of functional status, which can be challenging for patients with advanced illness.   Vaughan Basta states that she previously promised her sister she would never place her in a facility. However, she expresses she is unable to care for [facility] in her current state. Vaughan Basta is also caregiver for her husband, who is wheelchair bound and chronically debilitated due to COPD.   We discussed patient's current illness and what it means in the larger context of her ongoing co-morbidities.  Natural disease trajectory of chronic illness and debility was discussed. Vaughan Basta verbalizes understanding that patient may not return to her previous baseline.   The difference between aggressive medical intervention and comfort care was considered. Provided  education and counseling at length on the philosophy and benefits of hospice care. Discussed that it offers a holistic approach to care in the setting of end-stage illness/disease, and is about supporting the patient where they are while allowing the natural course to occur. Discussed hospice can provide personal care, support for the family, and help keep patient out of the hospital.  Vaughan Basta is very receptive to the concept of hospice. She reports positive experiences with other patients under hospice care. Discussed that patients are not eligible for hospice while in rehab, but recommended outpatient palliative in the meantime. Discussed that outpatient palliative could assist with transition to  hospice at some point in the future. Discussed that patient could have hospice care at home or in long-term SNF.    Primary decision maker: Janalyn Shy, sister    SUMMARY OF RECOMMENDATIONS   Continue current care Patient will need placement for SNF/rehab Sister is open to hospice care after rehab  Outpatient palliative at discharge PMT will continue to follow  Code Status/Advance Care Planning: DNR  Psycho-social/Spiritual:  Created space and opportunity for family to express thoughts and feelings regarding patient's current medical situation.  Emotional support provided  Prognosis:  < 6 months  Discharge Planning: [place] for rehab with Palliative care service follow-up      Primary Diagnoses: Present on Admission:  Atrial fibrillation with RVR ([place])  Hyperlipidemia  Hypertension   I have reviewed the medical record, interviewed the patient and family, and examined the patient. The following aspects are pertinent.  Past Medical History:  Diagnosis Date   AF (atrial fibrillation) (HCC)    Arthritis    Asthma    Diabetes mellitus    TYPE 2   Hyperlipidemia    Hypertension    PNA (pneumonia)    Scheduled Meds:  docusate sodium  100 mg Oral BID   fluticasone furoate-vilanterol  1 puff Inhalation QHS   insulin aspart  0-5 Units Subcutaneous QHS   insulin aspart  0-9 Units Subcutaneous TID WC   loratadine  10 mg Oral Daily   methocarbamol  750 mg Oral TID   metoprolol succinate  100 mg Oral BID   pantoprazole  40 mg Oral Daily   sodium chloride flush  3 mL Intravenous Q12H   Warfarin - Pharmacist Dosing Inpatient   Does not apply q1600   Continuous Infusions:  diltiazem (CARDIZEM) infusion 9.2 mg/hr (07/03/21 0129)   PRN Meds:.albuterol, bisacodyl, hydrALAZINE, ondansetron **OR** ondansetron (ZOFRAN) IV, oxyCODONE, polyethylene glycol, traMADol   Allergies  Allergen Reactions   Wheat Bran Shortness Of Breath   Kiwi Extract Other (See  Comments)    Causes mouth sores   Orange Fruit [Citrus] Other (See Comments)    Causes mouth sores   Phenergan [Promethazine] Other (See Comments)    Disorientation    Pineapple Other (See Comments)    Causes mouth sores    Promethazine Hcl Other (See Comments)    Disorientation    Tomato Other (See Comments)    Causes mouth sores   Tylenol [Acetaminophen] Other (See Comments)    Causes fast heart rate   Vistaril [Hydroxyzine Hcl] Other (See Comments)    Confusion    Codeine Itching and Rash   Tape Rash and Other (See Comments)    Blisters on skin   Review of Systems  Neurological:  Positive for weakness.   Physical Exam Vitals reviewed.  Constitutional:      General: She is not in acute distress.  Comments: Acutely and chronically ill-appearing  Cardiovascular:     Rate and Rhythm: Tachycardia present. Rhythm irregularly irregular.     Comments: A-fib Pulmonary:     Effort: Pulmonary effort is normal.     Comments: 4L oxygen Skin:    Comments: Large skin tear, right hand  Neurological:     Mental Status: She is alert.     Motor: Weakness present.    Vital Signs: BP 122/81    Pulse (!) 118    Temp 97.8 F (36.6 C)    Resp (!) 22    SpO2 98%  Pain Scale: 0-10   Pain Score: 0-No pain   SpO2: SpO2: 98 % O2 Device:SpO2: 98 % O2 Flow Rate: .    Palliative Assessment/Data: PPS 30%     Time In: 1200 Time Out: 1315 Time Total: 75 minutes Greater than 50%  of this time was spent counseling and coordinating care related to the above assessment and plan.  Signed by: Lavena Bullion, NP   Please contact Palliative Medicine Team phone at [phone] for questions and concerns.  For individual provider: See Shea Evans

## 2021-07-03 NOTE — Evaluation (Signed)
Physical Therapy Evaluation Patient Details Name: Casey Patel MRN: [id number] DOB: [birth year] Today's Date: 07/03/2021  History of Present Illness  Pt is an 88 y/o female admitted secondary to tachycardia. Pt with recent admission for UTI from 12/27-30. PMH includes CHF, a fib, HTN, DM.  Clinical Impression  Pt admitted secondary to problem above with deficits below. Pt requiring total A for rolling for clean up following BM. Unsafe to attempt further mobility with +1 on ED stretcher. Pt with notable wound on sacrum; RN notified and aware. Positioned on her R side at end of session to assist with off-weighting sacrum. Pt has had increased difficulty at home since previous admission to hospital. Recommending SNF level therapies at d/c. Will continue to follow acutely.      Recommendations for follow up therapy are one component of a multi-disciplinary discharge planning process, led by the attending physician.  Recommendations may be updated based on patient status, additional functional criteria and insurance authorization.  Follow Up Recommendations Skilled nursing-short term rehab (<3 hours/day)    Assistance Recommended at Discharge Frequent or constant Supervision/Assistance  Patient can return home with the following  Two people to help with walking and/or transfers;Two people to help with bathing/dressing/bathroom;Assistance with cooking/housework;Direct supervision/assist for financial management;Direct supervision/assist for medications management;Help with stairs or ramp for entrance;Assist for transportation    Equipment Recommendations None recommended by PT  Recommendations for Other Services       Functional Status Assessment Patient has had a recent decline in their functional status and demonstrates the ability to make significant improvements in function in a reasonable and predictable amount of time.     Precautions / Restrictions Precautions Precautions:  Fall Restrictions Weight Bearing Restrictions: No      Mobility  Bed Mobility Overal bed mobility: Needs Assistance Bed Mobility: Rolling Rolling: Total assist         General bed mobility comments: Total A to roll from side to side for clean up following BM. Noted pressure injury at sacrum with some drainage and blood; RN notified and aware.    Transfers                        Ambulation/Gait                  Stairs            Wheelchair Mobility    Modified Rankin (Stroke Patients Only)       Balance                                             Pertinent Vitals/Pain Pain Assessment: Faces Faces Pain Scale: Hurts even more Pain Location: back Pain Descriptors / Indicators: Sore Pain Intervention(s): Limited activity within patient's tolerance;Monitored during session;Repositioned    Home Living Family/patient expects to be discharged to:: Private residence Living Arrangements: Other relatives Available Help at Discharge: Family;Available 24 hours/day Type of Home: House Home Access: Ramped entrance       Home Layout: One level Home Equipment: Conservation officer, nature (2 wheels);Shower seat;Transport chair;BSC/3in1;Hospital bed      Prior Function Prior Level of Function : Needs assist             Mobility Comments: Has been in the bed most of the time since discharge. Prior to recent hospital admission, pt was able to ambulate short  distances with RW. ADLs Comments: Uses diapers for toileting and sister assists with bathing at bedlevel.     Hand Dominance        Extremity/Trunk Assessment   Upper Extremity Assessment Upper Extremity Assessment: Defer to OT evaluation    Lower Extremity Assessment Lower Extremity Assessment: Generalized weakness    Cervical / Trunk Assessment Cervical / Trunk Assessment: Kyphotic Cervical / Trunk Exceptions: profound cervical kyphosis, chin rests on chest at baseline   Communication   Communication: No difficulties  Cognition Arousal/Alertness: Awake/alert Behavior During Therapy: WFL for tasks assessed/performed Overall Cognitive Status: No family/caregiver present to determine baseline cognitive functioning                                          General Comments General comments (skin integrity, edema, etc.): No family present    Exercises     Assessment/Plan    PT Assessment Patient needs continued PT services  PT Problem List Decreased strength;Decreased mobility;Decreased balance;Pain;Decreased skin integrity;Decreased activity tolerance;Cardiopulmonary status limiting activity       PT Treatment Interventions Therapeutic activities;Therapeutic exercise;Gait training;Functional mobility training;Patient/family education;Balance training;DME instruction    PT Goals (Current goals can be found in the Care Plan section)  Acute Rehab PT Goals Patient Stated Goal: to go home PT Goal Formulation: With patient Time For Goal Achievement: 07/17/21 Potential to Achieve Goals: Fair    Frequency Min 2X/week     Co-evaluation               AM-PAC PT "6 Clicks" Mobility  Outcome Measure Help needed turning from your back to your side while in a flat bed without using bedrails?: Total Help needed moving from lying on your back to sitting on the side of a flat bed without using bedrails?: Total Help needed moving to and from a bed to a chair (including a wheelchair)?: Total Help needed standing up from a chair using your arms (e.g., wheelchair or bedside chair)?: Total Help needed to walk in hospital room?: Total Help needed climbing 3-5 steps with a railing? : Total 6 Click Score: 6    End of Session Equipment Utilized During Treatment: Gait belt;Oxygen Activity Tolerance: Patient limited by pain;Patient limited by fatigue Patient left: in bed;with call bell/phone within reach (on stretcher in ED) Nurse  Communication: Mobility status PT Visit Diagnosis: Difficulty in walking, not elsewhere classified (R26.2);Pain;History of falling (Z91.81);Muscle weakness (generalized) (M62.81)    Time: 3383-2919 PT Time Calculation (min) (ACUTE ONLY): 16 min   Charges:   PT Evaluation $PT Eval Moderate Complexity: 1 Mod          Reuel Derby, PT, DPT  Acute Rehabilitation Services  Pager: [phone] Office: [phone]   Rudean Hitt 07/03/2021, 4:44 PM

## 2021-07-03 NOTE — ED Notes (Signed)
RN transitioned pt off cardizem gtt one hour p oral administration of metoprolol per order. At this time pt remains in 120HR with a fib. RN sent secure chat to Dr Broadus John to inform her of same.  Pt was seen by PT earlier today, cleaned and repositioned.  Pt's sister is at bedside at this time.

## 2021-07-03 NOTE — Progress Notes (Signed)
ANTICOAGULATION CONSULT NOTE - Follow Up Consult  Pharmacy Consult for Warfarin Indication: atrial fibrillation  Allergies  Allergen Reactions   Wheat Bran Shortness Of Breath   Kiwi Extract Other (See Comments)    Causes mouth sores   Orange Fruit [Citrus] Other (See Comments)    Causes mouth sores   Phenergan [Promethazine] Other (See Comments)    Disorientation    Pineapple Other (See Comments)    Causes mouth sores    Promethazine Hcl Other (See Comments)    Disorientation    Tomato Other (See Comments)    Causes mouth sores   Tylenol [Acetaminophen] Other (See Comments)    Causes fast heart rate   Vistaril [Hydroxyzine Hcl] Other (See Comments)    Confusion    Codeine Itching and Rash   Tape Rash and Other (See Comments)    Blisters on skin    Patient Measurements:   91.6 Vital Signs: BP: 115/79 (01/05 1300) Pulse Rate: 118 (01/05 1300)  Labs: Recent Labs    06/30/21 1755 07/02/21 1155 07/02/21 1400 07/03/21 0241  HGB 9.1* 9.4*  --  9.5*  HCT 30.8* 31.7*  --  32.3*  PLT 292 329  --  331  LABPROT 29.1* 24.0*  --   --   INR 2.8* 2.2*  --   --   CREATININE 0.50 0.63  --  0.61  TROPONINIHS  --  18* 19*  --     Estimated Creatinine Clearance: 50.9 mL/min (by C-G formula based on SCr of 0.61 mg/dL).   Medications:  Scheduled:   docusate sodium  100 mg Oral BID   fluticasone furoate-vilanterol  1 puff Inhalation QHS   insulin aspart  0-5 Units Subcutaneous QHS   insulin aspart  0-9 Units Subcutaneous TID WC   loratadine  10 mg Oral Daily   methocarbamol  750 mg Oral TID   metoprolol succinate  100 mg Oral BID   pantoprazole  40 mg Oral Daily   sodium chloride flush  3 mL Intravenous Q12H   Warfarin - Pharmacist Dosing Inpatient   Does not apply q1600    Assessment: 88 years of age female admitted with weakness. Past medical history significant for Afib with RVR and chronically anticoagulated with warfarin. Pharmacy consulted to resume warfarin.    INR today 2.2 - therapeutic. CBC low-stable.  Home regimen: 2.5 mg Sun-Tues-Wed-Thurs-Sat, None Mon and Fri  Goal of Therapy:  INR 2-3 Monitor platelets by anticoagulation protocol: Yes   Plan:  Warfarin 2.5mg  po x1 tonight.  Follow-up PT/INR.  Sloan Leiter, PharmD, BCPS, BCCCP Clinical Pharmacist Please refer to [facility] for [place] numbers 07/03/2021,1:13 PM

## 2021-07-03 NOTE — ED Notes (Signed)
Pt ate a small portion of her breakfast stated that it taste like nothing and didn't want anymore.

## 2021-07-03 NOTE — ED Notes (Signed)
The pt was repositioned onto her rt side pt does not respond to questions of does she have any pain

## 2021-07-04 ENCOUNTER — Inpatient Hospital Stay (HOSPITAL_COMMUNITY): Payer: Medicare Other

## 2021-07-04 DIAGNOSIS — Z7189 Other specified counseling: Secondary | ICD-10-CM | POA: Diagnosis not present

## 2021-07-04 DIAGNOSIS — R0609 Other forms of dyspnea: Secondary | ICD-10-CM | POA: Diagnosis not present

## 2021-07-04 DIAGNOSIS — Z515 Encounter for palliative care: Secondary | ICD-10-CM

## 2021-07-04 DIAGNOSIS — I4891 Unspecified atrial fibrillation: Secondary | ICD-10-CM | POA: Diagnosis not present

## 2021-07-04 DIAGNOSIS — R531 Weakness: Secondary | ICD-10-CM

## 2021-07-04 LAB — ECHOCARDIOGRAM COMPLETE
Calc EF: 42.1 %
Height: 66 in
S' Lateral: 2.8 cm
Single Plane A2C EF: 39 %
Single Plane A4C EF: 44.8 %
Weight: 3019.42 oz

## 2021-07-04 LAB — PROTIME-INR
INR: 2.7 — ABNORMAL HIGH (ref 0.8–1.2)
Prothrombin Time: 28.6 seconds — ABNORMAL HIGH (ref 11.4–15.2)

## 2021-07-04 LAB — CBC
HCT: 30.3 % — ABNORMAL LOW (ref 36.0–46.0)
Hemoglobin: 9.3 g/dL — ABNORMAL LOW (ref 12.0–15.0)
MCH: 27.1 pg (ref 26.0–34.0)
MCHC: 30.7 g/dL (ref 30.0–36.0)
MCV: 88.3 fL (ref 80.0–100.0)
Platelets: 352 10*3/uL (ref 150–400)
RBC: 3.43 MIL/uL — ABNORMAL LOW (ref 3.87–5.11)
RDW: 18.3 % — ABNORMAL HIGH (ref 11.5–15.5)
WBC: 10.3 10*3/uL (ref 4.0–10.5)
nRBC: 0 % (ref 0.0–0.2)

## 2021-07-04 LAB — BASIC METABOLIC PANEL
Anion gap: 8 (ref 5–15)
BUN: 30 mg/dL — ABNORMAL HIGH (ref 8–23)
CO2: 28 mmol/L (ref 22–32)
Calcium: 8 mg/dL — ABNORMAL LOW (ref 8.9–10.3)
Chloride: 98 mmol/L (ref 98–111)
Creatinine, Ser: 0.46 mg/dL (ref 0.44–1.00)
GFR, Estimated: >60 mL/min (ref >60)
Glucose, Bld: 150 mg/dL — ABNORMAL HIGH (ref 70–99)
Potassium: 4.2 mmol/L (ref 3.5–5.1)
Sodium: 134 mmol/L — ABNORMAL LOW (ref 135–145)

## 2021-07-04 LAB — GLUCOSE, CAPILLARY
Glucose-Capillary: 150 mg/dL — ABNORMAL HIGH (ref 70–99)
Glucose-Capillary: 169 mg/dL — ABNORMAL HIGH (ref 70–99)
Glucose-Capillary: 138 mg/dL — ABNORMAL HIGH (ref 70–99)
Glucose-Capillary: 137 mg/dL — ABNORMAL HIGH (ref 70–99)

## 2021-07-04 MED ORDER — DIGOXIN 125 MCG PO TABS
0.1250 mg | ORAL_TABLET | Freq: Every day | ORAL | Status: DC
  Administered 2021-07-05 – 2021-07-08 (×4): 0.125 mg via ORAL
  Filled 2021-07-04 (×4): qty 1

## 2021-07-04 MED ORDER — DIGOXIN 0.25 MG/ML IJ SOLN
0.2500 mg | Freq: Four times a day (QID) | INTRAMUSCULAR | Status: AC
  Administered 2021-07-04 (×2): 0.25 mg via INTRAVENOUS
  Filled 2021-07-04 (×2): qty 1

## 2021-07-04 NOTE — Progress Notes (Signed)
PROGRESS NOTE    Casey Patel  HCW:[id number] DOB: 11/06/1929 DOA: 07/02/2021 PCP: [name], MD  Brief Narrative: 89/F with general debility, hypertension, diabetes mellitus, history of atrial fibrillation recently hospitalized with UTI, sepsis, on 12/30, family declined SNF options and went home instead, came back to the ER on 1/2 with A. fib RVR, cardiology recommended changing her Toprol 1 from 150 mg daily to 100 mg twice daily. Subsequently patient has been weaker than usual and less able to participate in ADLs, heart rate has been persistently elevated and was brought back to the ER  Assessment & Plan:   Atrial fibrillation with RVR ([place])- (present on admission) -Long history of A. fib presenting with persistent A. fib RVR  -Recently Toprol XL dose was increased to 100 mg twice daily  -Toprol resumed, wean off Cardizem drip as tolerated  -continue Cardizem drip today, restart Toprol 100 mg twice daily and attempt to wean off drip as tolerated  -Add digoxin for rate control with soft BPs today -Follow-up 2D echo, to assess RV, RA -TSH normal -Coumadin continued per pharmacy -Palliative consulted, may be appropriate for home hospice services -PT/OT, Toc consult   HTN -Toprol resumed, blood pressures soft, monitor   DM -Last A1c was 6.0 -Glucophage on hold, continue sensitive sliding scale   Goals of care, counseling/discussion -Discussed with sister yesterday, she is DNR, at risk for frequent rehospitalizations and ongoing decline -Palliative consulted for goals of care discussions   General debility, failure to thrive   DVT prophylaxis: Coumadin Code Status: DNR Family Communication: No family at bedside, updated Sister 1/5 Disposition Plan:  Status is: Inpatient  Remains inpatient appropriate because: Severity of illness  Consultants:    Procedures:   Antimicrobials:    Subjective: -Confusion overnight, no events otherwise, heart rate remains in the  100s  Objective: Vitals:   07/04/21 0438 07/04/21 0750 07/04/21 1130 07/04/21 1313  BP: 110/71 105/69 (!) 116/94   Pulse: (!) 110 94 100   Resp: 20 20 18    Temp: 98.2 F (36.8 C) 98.3 F (36.8 C)    TempSrc: Oral Oral    SpO2: 99% 97% 92%   Weight: 85.6 kg     Height:    5\' 6"  (1.676 m)    Intake/Output Summary (Last 24 hours) at 07/04/2021 1315 Last data filed at 07/04/2021 0500 Gross per 24 hour  Intake 263.44 ml  Output 0 ml  Net 263.44 ml   Filed Weights   07/04/21 0438  Weight: 85.6 kg    Examination:  General exam: Elderly chronically ill female, sitting up in bed, neck flexed, awake alert oriented to self and partly to place, cognitive deficits noted CVS: S1-S2, irregularly irregular rhythm, tachycardic Lungs: Poor air movement bilaterally, Abdomen: Soft, nontender, bowel sounds present Extremities: No edema Psychiatry: Flat affect  Data Reviewed:   CBC: Recent Labs  Lab 06/30/21 1755 07/02/21 1155 07/03/21 0241 07/04/21 0209  WBC 7.6 10.3 9.0 10.3  NEUTROABS  --  9.0*  --   --   HGB 9.1* 9.4* 9.5* 9.3*  HCT 30.8* 31.7* 32.3* 30.3*  MCV 89.3 91.1 90.5 88.3  PLT 292 329 331 176   Basic Metabolic Panel: Recent Labs  Lab 06/30/21 1755 07/02/21 1155 07/03/21 0241 07/04/21 0209  NA 136 139 137 134*  K 4.9 4.9 4.3 4.2  CL 100 102 99 98  CO2 30 29 32 28  GLUCOSE 149* 158* 130* 150*  BUN 29* 32* 31* 30*  CREATININE 0.50  0.63 0.61 0.46  CALCIUM 8.3* 8.2* 8.3* 8.0*  MG 1.8  --   --   --    GFR: Estimated Creatinine Clearance: 52.5 mL/min (by C-G formula based on SCr of 0.46 mg/dL). Liver Function Tests: Recent Labs  Lab 07/02/21 1155  AST 28  ALT 19  ALKPHOS 68  BILITOT 0.7  PROT 5.8*  ALBUMIN 2.3*   Recent Labs  Lab 07/02/21 1155  LIPASE 20   No results for input(s): AMMONIA in the last 168 hours. Coagulation Profile: Recent Labs  Lab 06/28/21 0642 06/30/21 1755 07/02/21 1155 07/03/21 1559 07/04/21 0209  INR 2.1* 2.8* 2.2*  2.2* 2.7*   Cardiac Enzymes: No results for input(s): CKTOTAL, CKMB, CKMBINDEX, TROPONINI in the last 168 hours. BNP (last 3 results) No results for input(s): PROBNP in the last 8760 hours. HbA1C: No results for input(s): HGBA1C in the last 72 hours. CBG: Recent Labs  Lab 07/03/21 1647 07/03/21 1819 07/03/21 2140 07/04/21 0752 07/04/21 1135  GLUCAP 125* 144* 132* 150* 169*   Lipid Profile: No results for input(s): CHOL, HDL, LDLCALC, TRIG, CHOLHDL, LDLDIRECT in the last 72 hours. Thyroid Function Tests: Recent Labs    07/03/21 1559  TSH 1.097   Anemia Panel: No results for input(s): VITAMINB12, FOLATE, FERRITIN, TIBC, IRON, RETICCTPCT in the last 72 hours. Urine analysis:    Component Value Date/Time   COLORURINE AMBER (A) 07/02/2021 1400   APPEARANCEUR HAZY (A) 07/02/2021 1400   LABSPEC 1.028 07/02/2021 1400   PHURINE 5.0 07/02/2021 1400   GLUCOSEU NEGATIVE 07/02/2021 1400   HGBUR NEGATIVE 07/02/2021 1400   BILIRUBINUR NEGATIVE 07/02/2021 1400   KETONESUR NEGATIVE 07/02/2021 1400   PROTEINUR 30 (A) 07/02/2021 1400   NITRITE NEGATIVE 07/02/2021 1400   LEUKOCYTESUR TRACE (A) 07/02/2021 1400   Sepsis Labs: @LABRCNTIP (procalcitonin:4,lacticidven:4)  ) Recent Results (from the past 240 hour(s))  Resp Panel by RT-PCR (Flu A&B, Covid) Nasopharyngeal Swab     Status: None   Collection Time: 06/24/21  3:14 PM   Specimen: Nasopharyngeal Swab; Nasopharyngeal(NP) swabs in vial transport medium  Result Value Ref Range Status   SARS Coronavirus 2 by RT PCR NEGATIVE NEGATIVE Final    Comment: (NOTE) SARS-CoV-2 target nucleic acids are NOT DETECTED.  The SARS-CoV-2 RNA is generally detectable in upper respiratory specimens during the acute phase of infection. The lowest concentration of SARS-CoV-2 viral copies this assay can detect is 138 copies/mL. A negative result does not preclude SARS-Cov-2 infection and should not be used as the sole basis for treatment or other  patient management decisions. A negative result may occur with  improper specimen collection/handling, submission of specimen other than nasopharyngeal swab, presence of viral mutation(s) within the areas targeted by this assay, and inadequate number of viral copies(<138 copies/mL). A negative result must be combined with clinical observations, patient history, and epidemiological information. The expected result is Negative.  Fact Sheet for Patients:  EntrepreneurPulse.com.au  Fact Sheet for Healthcare Providers:  IncredibleEmployment.be  This test is no t yet approved or cleared by the [place] FDA and  has been authorized for detection and/or diagnosis of SARS-CoV-2 by FDA under an Emergency Use Authorization (EUA). This EUA will remain  in effect (meaning this test can be used) for the duration of the COVID-19 declaration under Section 564(b)(1) of the Act, 21 U.S.C.section 360bbb-3(b)(1), unless the authorization is terminated  or revoked sooner.       Influenza A by PCR NEGATIVE NEGATIVE Final   Influenza B by PCR NEGATIVE  NEGATIVE Final    Comment: (NOTE) The Xpert Xpress SARS-CoV-2/FLU/RSV plus assay is intended as an aid in the diagnosis of influenza from Nasopharyngeal swab specimens and should not be used as a sole basis for treatment. Nasal washings and aspirates are unacceptable for Xpert Xpress SARS-CoV-2/FLU/RSV testing.  Fact Sheet for Patients: EntrepreneurPulse.com.au  Fact Sheet for Healthcare Providers: IncredibleEmployment.be  This test is not yet approved or cleared by the [place] FDA and has been authorized for detection and/or diagnosis of SARS-CoV-2 by FDA under an Emergency Use Authorization (EUA). This EUA will remain in effect (meaning this test can be used) for the duration of the COVID-19 declaration under Section 564(b)(1) of the Act, 21 U.S.C. section  360bbb-3(b)(1), unless the authorization is terminated or revoked.  Performed at [facility], [place] [address] [place], [place] [address]   Urine Culture     Status: Abnormal   Collection Time: 06/24/21  3:30 PM   Specimen: Urine, Catheterized  Result Value Ref Range Status   Specimen Description   Final    URINE, CATHETERIZED Performed at [place] [address] [place], [place] [address]    Special Requests   Final    NONE Performed at [facility], [place] [address], [place], [place] [address]    Culture MULTIPLE SPECIES PRESENT, SUGGEST RECOLLECTION (A)  Final   Report Status 06/25/2021 FINAL  Final  Urine Culture     Status: Abnormal   Collection Time: 06/26/21  8:50 AM   Specimen: Urine, Clean Catch  Result Value Ref Range Status   Specimen Description   Final    URINE, CLEAN CATCH Performed at [facility], [place] [address], [place], [place] [address]    Special Requests   Final    NONE Performed at [facility], [place] [address], [place], [place] [address]    Culture (A)  Final    <10,000 COLONIES/mL INSIGNIFICANT GROWTH Performed at [place] [address] [place], [place] [address]    Report Status 06/27/2021 FINAL  Final  SARS CORONAVIRUS 2 (TAT 6-24 HRS) Nasopharyngeal Nasopharyngeal Swab     Status: None   Collection Time: 06/27/21  9:35 AM   Specimen: Nasopharyngeal Swab  Result Value Ref Range Status   SARS Coronavirus 2 NEGATIVE NEGATIVE Final    Comment: (NOTE) SARS-CoV-2 target nucleic acids are NOT DETECTED.  The SARS-CoV-2 RNA is generally detectable in upper and lower respiratory specimens during the acute phase of infection. Negative results do not preclude SARS-CoV-2 infection, do not rule out co-infections with other pathogens, and should not be used as the sole basis for treatment or other patient management decisions. Negative results  must be combined with clinical observations, patient history, and epidemiological information. The expected result is Negative.  Fact Sheet for Patients: SugarRoll.be  Fact Sheet for Healthcare Providers: https://www.woods-mathews.com/  This test is not yet approved or cleared by the [place] FDA and  has been authorized for detection and/or diagnosis of SARS-CoV-2 by FDA under an Emergency Use Authorization (EUA). This EUA will remain  in effect (meaning this test can be used) for the duration of the COVID-19 declaration under Se ction 564(b)(1) of the Act, 21 U.S.C. section 360bbb-3(b)(1), unless the authorization is terminated or revoked sooner.  Performed at [facility], [place] [address], [place],  [address]   Urine Culture     Status: None   Collection Time: 06/30/21  1:30  PM   Specimen: In/Out Cath Urine  Result Value Ref Range Status   Specimen Description   Final    IN/OUT CATH URINE Performed at [facility], [address] [place], [place] [address]    Special Requests   Final    NONE Performed at [facility], [address] [place], [place] [address]    Culture   Final    NO GROWTH Performed at [facility], [place] [address], [place], [place] [address]    Report Status 07/02/2021 FINAL  Final  Resp Panel by RT-PCR (Flu A&B, Covid) Nasopharyngeal Swab     Status: None   Collection Time: 07/02/21 11:35 AM   Specimen: Nasopharyngeal Swab; Nasopharyngeal(NP) swabs in vial transport medium  Result Value Ref Range Status   SARS Coronavirus 2 by RT PCR NEGATIVE NEGATIVE Final    Comment: (NOTE) SARS-CoV-2 target nucleic acids are NOT DETECTED.  The SARS-CoV-2 RNA is generally detectable in upper respiratory specimens during the acute phase of infection. The lowest concentration of SARS-CoV-2 viral copies this assay can detect is 138 copies/mL. A negative result does not preclude SARS-Cov-2 infection  and should not be used as the sole basis for treatment or other patient management decisions. A negative result may occur with  improper specimen collection/handling, submission of specimen other than nasopharyngeal swab, presence of viral mutation(s) within the areas targeted by this assay, and inadequate number of viral copies(<138 copies/mL). A negative result must be combined with clinical observations, patient history, and epidemiological information. The expected result is Negative.  Fact Sheet for Patients:  EntrepreneurPulse.com.au  Fact Sheet for Healthcare Providers:  IncredibleEmployment.be  This test is no t yet approved or cleared by the [place] FDA and  has been authorized for detection and/or diagnosis of SARS-CoV-2 by FDA under an Emergency Use Authorization (EUA). This EUA will remain  in effect (meaning this test can be used) for the duration of the COVID-19 declaration under Section 564(b)(1) of the Act, 21 U.S.C.section 360bbb-3(b)(1), unless the authorization is terminated  or revoked sooner.       Influenza A by PCR NEGATIVE NEGATIVE Final   Influenza B by PCR NEGATIVE NEGATIVE Final    Comment: (NOTE) The Xpert Xpress SARS-CoV-2/FLU/RSV plus assay is intended as an aid in the diagnosis of influenza from Nasopharyngeal swab specimens and should not be used as a sole basis for treatment. Nasal washings and aspirates are unacceptable for Xpert Xpress SARS-CoV-2/FLU/RSV testing.  Fact Sheet for Patients: EntrepreneurPulse.com.au  Fact Sheet for Healthcare Providers: IncredibleEmployment.be  This test is not yet approved or cleared by the [place] FDA and has been authorized for detection and/or diagnosis of SARS-CoV-2 by FDA under an Emergency Use Authorization (EUA). This EUA will remain in effect (meaning this test can be used) for the duration of the COVID-19 declaration  under Section 564(b)(1) of the Act, 21 U.S.C. section 360bbb-3(b)(1), unless the authorization is terminated or revoked.  Performed at [facility], [place] [address], [place], [place] [address]      Radiology Studies: No results found.  Scheduled Meds:  digoxin  0.25 mg Intravenous Q6H   [START ON 07/05/2021] digoxin  0.125 mg Oral Daily   docusate sodium  100 mg Oral BID   fluticasone furoate-vilanterol  1 puff Inhalation QHS   insulin aspart  0-5 Units Subcutaneous QHS   insulin aspart  0-9 Units Subcutaneous TID WC   loratadine  10 mg Oral Daily   methocarbamol  750  mg Oral TID   metoprolol succinate  100 mg Oral BID   nystatin  1 application Topical BID   pantoprazole  40 mg Oral Daily   sodium chloride flush  3 mL Intravenous Q12H   Warfarin - Pharmacist Dosing Inpatient   Does not apply q1600   Continuous Infusions:  diltiazem (CARDIZEM) infusion 10 mg/hr (07/04/21 0350)     LOS: 2 days    Time spent: 46min  Domenic Polite, MD [facility]   07/04/2021, 1:15 PM

## 2021-07-04 NOTE — Progress Notes (Signed)
Daily Progress Note   Patient Name: Casey Patel       Date: 07/04/2021 DOB: [birth year]  Age: 88 y.o. MRN#: [id number] Attending Physician: Casey Polite, MD Primary Care Physician: Casey Pao, MD Admit Date: 07/02/2021    HPI/Patient Profile: 88 y.o. female  with past medical history of atrial fibrillation on coumadin, hypertension, diabetes mellitus, and general debility. She was recently hospitalized at [facility] 12/27 - 12/31 with sepsis and UTI. Family declined SNF options and patient discharged home on 12/31. She came back to the ED on 06/30/21 with atrial fibrillation with RVR; cardiology recommended changing Toprol from 150 mg daily to 100 mg twice daily.  She presented back to the ED on 07/02/2021 with atrial fibrillation and weakness. Admitted to [facility].     Subjective: Chart reviewed. Cardizem infusion turned back on overnight due to uncontrolled HR.   Sister/Casey Patel is at bedside. Casey Patel, concepts specific to code status, artifical feeding and hydration, and rehospitalization were considered and discussed. We completed a MOST form today. The patient and family outlined their wishes for the following treatment decisions:  Cardiopulmonary Resuscitation: Do Not Attempt Resuscitation (DNR/No CPR)  Medical Interventions: Limited Additional Interventions: Use medical treatment, IV fluids and cardiac monitoring as indicated, DO NOT USE intubation or mechanical ventilation. May consider use of less invasive airway support such as BiPAP or CPAP. Also provide comfort measures. Transfer to the hospital if indicated. Avoid intensive care.   Antibiotics: Antibiotics if indicated  IV Fluids: IV fluids for a defined trial period  Feeding Tube: No feeding tube    Casey Patel.  Living will was reviewed. Patient desires for natural death and not to receive life-prolonging measures in the following situations: She has condition that is incurable and will result in death within a short period of time She is unconscious and her medical team is confident she cannot regain consciousness She has Casey dementia or other substantial and irreversible loss of mental function  Living will also states that her health care providers shall withhold or withdraw life-prolonging measures.     Length of Stay: 2   Physical Exam Vitals reviewed.  Constitutional:      General: She is not in acute distress.    Appearance: She is ill-appearing.  Cardiovascular:  Rate and Rhythm: Rhythm irregularly irregular.  Pulmonary:     Effort: Pulmonary effort is normal.  Neurological:     Mental Status: She is alert.     Motor: Weakness present.            Vital Signs: BP (!) 116/94 (BP Location: Right Arm)    Pulse 100    Temp 98.3 F (36.8 C) (Oral)    Resp 18    Wt 85.6 kg    SpO2 92%    BMI 30.46 kg/m  SpO2: SpO2: 92 % O2 Device: O2 Device: Nasal Cannula O2 Flow Rate: O2 Flow Rate (L/min): 2 L/min    Palliative Assessment/Data: PPS 30-40%      Palliative Care Assessment & Plan   Assessment: - atrial fib with RVR - Acute metabolic encephalopathy - possible UTI - general debility and failure to thrive  Recommendations/Plan: Continue current care MOST form completed and copy made to scan into [facility] Copies made of HCPOA and living will to be scanned into [facility] Patient will need placement for SNF/rehab Sister is open to hospice care after rehab  Outpatient palliative at discharge PMT will continue to follow  Goals of Care and Additional Recommendations: Limitations on Scope of Treatment: No Artificial Feeding  Code Status: DNR/DNI  Prognosis:  < 6 months  Discharge Planning: [facility] for  rehab with Palliative care service follow-up    Thank you for allowing the Palliative Medicine Team to assist in the care of this patient.   Total time: 40 minutes    Greater than 50%  of this time was spent counseling and coordinating care related to the above assessment and plan.  Casey Bullion, NP  Please contact Palliative Medicine Team phone at [phone] for questions and concerns.

## 2021-07-04 NOTE — Evaluation (Signed)
Occupational Therapy Evaluation Patient Details Name: Casey Patel MRN: [id number] DOB: [birth year] Today's Date: 07/04/2021   History of Present Illness Pt is an 88 y/o female admitted secondary to tachycardia. Pt with recent admission for UTI from 12/27-30. PMH includes CHF, a fib, HTN, DM.   Clinical Impression   Pt presents with decline in function and safety with ADLs and ADL mobility with impaired strength, balance and endurance; pt limited by pain in R knee. Pt recently d/c from this hospital to home (family declined SNF). Currently  requiring total A for rolling in bed, unable to sit EOB, max A with UB ADLs ADLs, total A with Lb ADLs and toileting total A at bed level. Pt/family has had increased difficulty at home since previous admission to hospital. Recommending SNF as safest d/c option. Pt would benefit from acute OT services to address impairments to maximize level of function and safety     Recommendations for follow up therapy are one component of a multi-disciplinary discharge planning process, led by the attending physician.  Recommendations may be updated based on patient status, additional functional criteria and insurance authorization.   Follow Up Recommendations  Skilled nursing-short term rehab (<3 hours/day)    Assistance Recommended at Discharge Frequent or constant Supervision/Assistance  Patient can return home with the following Two people to help with walking and/or transfers;Two people to help with bathing/dressing/bathroom    Functional Status Assessment  Patient has had a recent decline in their functional status and demonstrates the ability to make significant improvements in function in a reasonable and predictable amount of time.  Equipment Recommendations  Other (comment) (TBD at SNF)    Recommendations for Other Services       Precautions / Restrictions Precautions Precautions: Fall Restrictions Weight Bearing Restrictions: No      Mobility Bed  Mobility Overal bed mobility: Needs Assistance Bed Mobility: Rolling Rolling: Total assist         General bed mobility comments: Total A to roll left and right, unable to achieve sup - sit    Transfers                   General transfer comment: unable      Balance Overall balance assessment: History of Falls                                         ADL either performed or assessed with clinical judgement   ADL Overall ADL's : Needs assistance/impaired Eating/Feeding: Minimal assistance;Bed level   Grooming: Minimal assistance;Wash/dry hands;Wash/dry face;Bed level   Upper Body Bathing: Maximal assistance;Bed level   Lower Body Bathing: Total assistance;Bed level   Upper Body Dressing : Maximal assistance;Bed level   Lower Body Dressing: Total assistance;Bed level                 General ADL Comments: pt limited by poor sttrength, endurance and pain with movement. Sacral ulcer present     Vision Baseline Vision/History: 1 Wears glasses Ability to See in Adequate Light: 0 Adequate Patient Visual Report: No change from baseline       Perception     Praxis      Pertinent Vitals/Pain Pain Assessment: Faces Faces Pain Scale: Hurts little more Pain Location: back and R knee/leg Pain Descriptors / Indicators: Grimacing;Sore;Guarding;Moaning Pain Intervention(s): Limited activity within patient's tolerance;Monitored during session;Repositioned     Hand Dominance  Extremity/Trunk Assessment Upper Extremity Assessment Upper Extremity Assessment: Generalized weakness RUE Deficits / Details: Grossly functional ROM; 3+/5 shoulder strength, 4--/5 elbow, wrist 5/5, grip 3+/5;  skin tear and brushing on dorsum of hand LUE Deficits / Details: WFL ROM, 4/5 shoulder, 4/5 elbow ,5/5 wrist, grip 4/5   Lower Extremity Assessment Lower Extremity Assessment: Defer to PT evaluation   Cervical / Trunk Assessment Cervical / Trunk  Assessment: Kyphotic Cervical / Trunk Exceptions: profound cervical kyphosis, chin rests on chest at baseline   Communication     Cognition Arousal/Alertness: Awake/alert Behavior During Therapy: WFL for tasks assessed/performed Overall Cognitive Status: No family/caregiver present to determine baseline cognitive functioning                                       General Comments       Exercises     Shoulder Instructions      Home Living Family/patient expects to be discharged to:: Private residence Living Arrangements: Other relatives (sister) Available Help at Discharge: Family;Available 24 hours/day Type of Home: House Home Access: Ramped entrance     Home Layout: One level     Bathroom Shower/Tub: Occupational psychologist: Handicapped height     Home Equipment: Conservation officer, nature (2 wheels);Shower seat;Transport chair;BSC/3in1;Hospital bed   Additional Comments: lives with sister Vaughan Basta and grandson; on 2L O2 at night at home      Prior Functioning/Environment Prior Level of Function : Needs assist       Physical Assist : ADLs (physical)     Mobility Comments: Has been in the bed most of the time since discharge. Prior to recent hospital admission, pt was able to ambulate short distances with RW. ADLs Comments: Uses briefs for toileting and sister assists with bathing at bed level.        OT Problem List: Decreased strength;Decreased range of motion;Decreased activity tolerance;Impaired balance (sitting and/or standing);Decreased knowledge of use of DME or AE;Pain;Impaired UE functional use      OT Treatment/Interventions: Self-care/ADL training;DME and/or AE instruction;Therapeutic activities;Balance training;Patient/family education    OT Goals(Current goals can be found in the care plan section) Acute Rehab OT Goals Patient Stated Goal: "get out of here" OT Goal Formulation: With patient Time For Goal Achievement:  07/18/21 Potential to Achieve Goals: Good ADL Goals Pt Will Perform Grooming: with min assist;with min guard assist;sitting Pt Will Perform Upper Body Bathing: with mod assist;with min assist;sitting Pt Will Perform Upper Body Dressing: with mod assist;with min assist;sitting Pt Will Transfer to Toilet: with max assist;with mod assist;with +2 assist;squat pivot transfer;stand pivot transfer;anterior/posterior transfer  OT Frequency: Min 2X/week    Co-evaluation              AM-PAC OT "6 Clicks" Daily Activity     Outcome Measure Help from another person eating meals?: A Little Help from another person taking care of personal grooming?: A Little Help from another person toileting, which includes using toliet, bedpan, or urinal?: Total Help from another person bathing (including washing, rinsing, drying)?: A Lot Help from another person to put on and taking off regular upper body clothing?: A Lot Help from another person to put on and taking off regular lower body clothing?: Total 6 Click Score: 12   End of Session    Activity Tolerance: Patient limited by fatigue;Patient limited by pain Patient left: with call bell/phone within reach;in bed;with bed  alarm set  OT Visit Diagnosis: Other abnormalities of gait and mobility (R26.89);Muscle weakness (generalized) (M62.81);Pain;History of falling (Z91.81) Pain - Right/Left: Right Pain - part of body: Knee                Time: 6384-5364 OT Time Calculation (min): 14 min Charges:  OT General Charges $OT Visit: 1 Visit OT Evaluation $OT Eval Moderate Complexity: 1 Mod    Britt Bottom 07/04/2021, 1:15 PM

## 2021-07-04 NOTE — Progress Notes (Signed)
In Afib with HR bouncing around 110s-140s. Provider notified. Noticed Cardizem drip wasn't running. Drip initiated at 5mg /hr per protocol. BP holding up for titration up to 10mg /hr now. HR down to 100-120. Oxygen weaned down from 3L to 1L now, sating mid-90s. No complaints of pain. Encouraging P.O. intake.

## 2021-07-04 NOTE — Progress Notes (Signed)
Echocardiogram 2D Echocardiogram has been performed.  Oneal Deputy Doctor Sheahan RDCS 07/04/2021, 2:11 PM

## 2021-07-04 NOTE — Progress Notes (Signed)
ANTICOAGULATION CONSULT NOTE - Follow Up Consult  Pharmacy Consult for Warfarin Indication: atrial fibrillation  Allergies  Allergen Reactions   Wheat Bran Shortness Of Breath   Kiwi Extract Other (See Comments)    Causes mouth sores   Orange Fruit [Citrus] Other (See Comments)    Causes mouth sores   Phenergan [Promethazine] Other (See Comments)    Disorientation    Pineapple Other (See Comments)    Causes mouth sores    Promethazine Hcl Other (See Comments)    Disorientation    Tomato Other (See Comments)    Causes mouth sores   Tylenol [Acetaminophen] Other (See Comments)    Causes fast heart rate   Vistaril [Hydroxyzine Hcl] Other (See Comments)    Confusion    Codeine Itching and Rash   Tape Rash and Other (See Comments)    Blisters on skin    Patient Measurements: Weight: 85.6 kg (188 lb 11.4 oz) 91.6 Vital Signs: Temp: 98.3 F (36.8 C) (01/06 0750) Temp Source: Oral (01/06 0750) BP: 105/69 (01/06 0750) Pulse Rate: 94 (01/06 0750)  Labs: Recent Labs    07/02/21 1155 07/02/21 1400 07/03/21 0241 07/03/21 1559 07/04/21 0209  HGB 9.4*  --  9.5*  --  9.3*  HCT 31.7*  --  32.3*  --  30.3*  PLT 329  --  331  --  352  LABPROT 24.0*  --   --  24.5* 28.6*  INR 2.2*  --   --  2.2* 2.7*  CREATININE 0.63  --  0.61  --  0.46  TROPONINIHS 18* 19*  --   --   --      Estimated Creatinine Clearance: 52.5 mL/min (by C-G formula based on SCr of 0.46 mg/dL).   Medications:  Scheduled:   docusate sodium  100 mg Oral BID   fluticasone furoate-vilanterol  1 puff Inhalation QHS   insulin aspart  0-5 Units Subcutaneous QHS   insulin aspart  0-9 Units Subcutaneous TID WC   loratadine  10 mg Oral Daily   methocarbamol  750 mg Oral TID   metoprolol succinate  100 mg Oral BID   nystatin  1 application Topical BID   pantoprazole  40 mg Oral Daily   sodium chloride flush  3 mL Intravenous Q12H   Warfarin - Pharmacist Dosing Inpatient   Does not apply q1600     Assessment: 88 years of age female admitted with weakness. Past medical history significant for Afib with RVR and chronically anticoagulated with warfarin. Pharmacy consulted to resume warfarin.  -INR today 2.2> 2.7  Home regimen: 2.5 mg Sun-Tues-Wed-Thurs-Sat, None Mon and Fri  Goal of Therapy:  INR 2-3 Monitor platelets by anticoagulation protocol: Yes   Plan:  No warfarin today as per home regimen Follow-up PT/INR.  Hildred Laser, PharmD Clinical Pharmacist **Pharmacist phone directory can now be found on [email or web address] ([email or web address]).  Listed under [place].

## 2021-07-04 NOTE — TOC Initial Note (Addendum)
Transition of Care [facility]) - Initial/Assessment Note    Patient Details  Name: Casey Patel MRN: [id number] Date of Birth: 06-09-1930  [facility] of [facility] CM/SW Contact:    Milas Gain, [place] Phone Number: 07/04/2021, 2:53 PM  Clinical Narrative:                  CSW received consult for possible SNF placement at time of discharge. Patient currently only oriented to self. CSW spoke with patients sister Vaughan Basta regarding PT recommendation of SNF placement at time of discharge. Patients sister expressed understanding of PT recommendation and is agreeable to SNF placement at time of discharge. Patients sister reports patient comes from home with her.Patients sister gave CSW permission to fax out the initial referral for patient near the [place] area. CSW discussed insurance authorization process and CSW will provided Medicare SNF ratings list to patients sister when available with accepted SNF bed offers. Patients sister reports patients has received the COVID vaccines as well as 2 boosters.  CSW received consult for palliative services to follow patient at SNF. Patients sister gave CSW permission to make referral to [facility]. CSW made referral to [facility] with [facility].No further questions reported at this time. CSW to continue to follow and assist with discharge planning needs.   Expected Discharge Plan: Skilled Nursing Facility Barriers to Discharge: Continued Medical Work up   Patient Goals and CMS Choice   CMS Medicare.gov Compare Post Acute Care list provided to:: Patient Represenative (must comment) (Patients sister Vaughan Basta) Choice offered to / list presented to : Sibling (Patients sister Vaughan Basta)  Expected Discharge Plan and Services Expected Discharge Plan: [place] In-house Referral: Clinical Social Work     Living arrangements for the past 2 months: Single Family Home                                      Prior Living Arrangements/Services Living  arrangements for the past 2 months: Single Family Home Lives with:: Siblings (Lives with sister Vaughan Basta) Patient language and need for interpreter reviewed:: Yes Do you feel safe going back to the place where you live?: No   SNF  Need for Family Participation in Patient Care: Yes (Comment) Care giver support system in place?: Yes (comment)   Criminal Activity/Legal Involvement Pertinent to Current Situation/Hospitalization: No - Comment as needed  Activities of Daily Living Home Assistive Devices/Equipment: Eyeglasses, Environmental consultant (specify type), Bedside commode/3-in-1 ADL Screening (condition at time of admission) Patient's cognitive ability adequate to safely complete daily activities?: No Is the patient deaf or have difficulty hearing?: No Does the patient have difficulty seeing, even when wearing glasses/contacts?: Yes Does the patient have difficulty concentrating, remembering, or making decisions?: Yes Patient able to express need for assistance with ADLs?: Yes Does the patient have difficulty dressing or bathing?: Yes Independently performs ADLs?: No Communication: Independent Dressing (OT): Needs assistance Is this a change from baseline?: Pre-admission baseline Grooming: Needs assistance Is this a change from baseline?: Pre-admission baseline Feeding: Independent Bathing: Needs assistance Is this a change from baseline?: Pre-admission baseline Toileting: Needs assistance Is this a change from baseline?: Pre-admission baseline In/Out Bed: Needs assistance Is this a change from baseline?: Pre-admission baseline Walks in Home: Dependent Is this a change from baseline?: Pre-admission baseline Does the patient have difficulty walking or climbing stairs?: Yes Weakness of Legs: Both Weakness of Arms/Hands: Both  Permission Sought/Granted Permission sought to share information with :  Case Manager, Family Supports, Customer service manager Permission granted to share  information with : No  Share Information with NAME: Patient currently only oriented to self spoke with patients sister Vaughan Basta  Permission granted to share info w AGENCY: Patient currently only oriented to self spoke with patients sister Linda/SNF  Permission granted to share info w Relationship: Patient currently only oriented to self spoke with patients sister Vaughan Basta  Permission granted to share info w Contact Information: Patient currently only oriented to self spoke with patients sister Vaughan Basta [phone]  Emotional Assessment       Orientation: : Oriented to Self Alcohol / Substance Use: Not Applicable Psych Involvement: No (comment)  Admission diagnosis:  Atrial fibrillation with rapid ventricular response ([place]) [I48.91] Atrial flutter with rapid ventricular response ([place]) [I48.92] Generalized weakness [R53.1] Patient Active Problem List   Diagnosis Date Noted   Atrial flutter with rapid ventricular response ([place]) 07/02/2021   Goals of care, counseling/discussion 46/28/6381   Acute metabolic encephalopathy 77/04/6578   Atrial fibrillation with RVR ([place]) 06/25/2021   Subtherapeutic international normalized ratio (INR) 06/25/2021   UTI (urinary tract infection) 06/24/2021   Pressure injury of skin 03/08/2018   Atrial fibrillation ([place]) 03/07/2018   Cough 01/01/2012   Pneumonia 12/03/2011   Arthritis    Hypertension    Hyperlipidemia    Asthma    DM (diabetes mellitus) ([place])    PCP:  [name], MD Pharmacy:   [id number] - [place], [address] [place] [address] Phone: [phone] Fax: [phone]     Social Determinants of Health (SDOH) Interventions    Readmission Risk Interventions Readmission Risk Prevention Plan 06/26/2021  Transportation Screening Complete  PCP or Specialist Appt within 5-7 Days Complete  Home Care Screening Complete  Medication Review (RN CM)  Complete  Some recent data might be hidden

## 2021-07-04 NOTE — [place] FL2 (Signed)
[place] MEDICAID FL2 LEVEL OF CARE SCREENING TOOL     IDENTIFICATION  Patient Name: Casey Patel Birthdate: 03/25/1930 Sex: female Admission Date (Current Location): 07/02/2021  [facility] and [place] Number:  Herbalist and Address:  [facility] [place], [place] [address] [place], [place] [address]      Provider Number: [id number]  Attending Physician Name and Address:  Domenic Polite, MD  Relative Name and Phone Number:  Vaughan Basta [phone]    Current Level of Care: Hospital Recommended Level of Care: [place] Prior Approval Number:    Date Approved/Denied:   PASRR Number: [id number]  Discharge Plan: SNF    Current Diagnoses: Patient Active Problem List   Diagnosis Date Noted   Atrial flutter with rapid ventricular response ([place]) 07/02/2021   Goals of care, counseling/discussion 64/33/2951   Acute metabolic encephalopathy 88/41/6606   Atrial fibrillation with RVR ([place]) 06/25/2021   Subtherapeutic international normalized ratio (INR) 06/25/2021   UTI (urinary tract infection) 06/24/2021   Pressure injury of skin 03/08/2018   Atrial fibrillation ([place]) 03/07/2018   Cough 01/01/2012   Pneumonia 12/03/2011   Arthritis    Hypertension    Hyperlipidemia    Asthma    DM (diabetes mellitus) ([place])     Orientation RESPIRATION BLADDER Height & Weight     Self  O2 (Nasal Cannula 2 liters) Incontinent, External catheter (External Urinary Catheter) Weight: 188 lb 11.4 oz (85.6 kg) Height:  5\' 6"  (167.6 cm)  BEHAVIORAL SYMPTOMS/MOOD NEUROLOGICAL BOWEL NUTRITION STATUS      Incontinent Diet (Please see discharge summary)  AMBULATORY STATUS COMMUNICATION OF NEEDS Skin   Extensive Assist Verbally Other (Comment) (Approp for ethnincity,dry,blister breast,L,,foam,abrasion arm,hand,knee,R,Gauze,Avulsion,hand,wrist,R,cleansed,ecchymosis,absomen,arm,bilateral,rash breast,bilateral,chest,medial foam,see additional info below)                        Personal Care Assistance Level of Assistance  Bathing, Feeding, Dressing Bathing Assistance: Maximum assistance Feeding assistance: Maximum assistance Dressing Assistance: Maximum assistance     Functional Limitations Info  Sight, Hearing, Speech Sight Info: Impaired Hearing Info: Impaired Speech Info: Adequate    SPECIAL CARE FACTORS FREQUENCY  PT (By licensed PT), OT (By licensed OT)     PT Frequency: 5x min weekly OT Frequency: 5x min weekly            Contractures Contractures Info: Not present    Additional Factors Info  Code Status, Allergies, Insulin Sliding Scale Code Status Info: DNR Allergies Info: Wheat Bran,Kiwi Extract,Orange Fruit (citrus),Phenergan (promethazine),Pineapple,Promethazine Hcl,Tomato,Tylenol (acetaminophen),Vistaril (hydroxyzine Hcl),Codeine,Tape   Insulin Sliding Scale Info: insulin aspart (novoLOG) injection 0-5 Units daily at bedtime,insulin aspart (novoLOG) injection 0-9 Units 3 times daily with meals       Current Medications (07/04/2021):  This is the current hospital active medication list Current Facility-Administered Medications  Medication Dose Route Frequency Provider Last Rate Last Admin   albuterol (PROVENTIL) (2.5 MG/3ML) 0.083% nebulizer solution 2.5 mg  2.5 mg Inhalation Q6H PRN Karmen Bongo, MD       bisacodyl (DULCOLAX) EC tablet 5 mg  5 mg Oral Daily PRN Karmen Bongo, MD       digoxin (LANOXIN) 0.25 MG/ML injection 0.25 mg  0.25 mg Intravenous Q6H Domenic Polite, MD   0.25 mg at 07/04/21 1419   [START ON 07/05/2021] digoxin (LANOXIN) tablet 0.125 mg  0.125 mg Oral Daily Domenic Polite, MD       diltiazem (CARDIZEM) 125 mg in dextrose 5% 125 mL (1 mg/mL) infusion  5-15 mg/hr Intravenous Titrated Karmen Bongo, MD 5 mL/hr at 07/04/21 1421 5 mg/hr at 07/04/21 1421   docusate sodium (COLACE) capsule 100 mg  100 mg Oral BID Karmen Bongo, MD   100 mg at 07/04/21 0920   fluticasone furoate-vilanterol (BREO  ELLIPTA) 100-25 MCG/ACT 1 puff  1 puff Inhalation QHS Karmen Bongo, MD       hydrALAZINE (APRESOLINE) injection 5 mg  5 mg Intravenous Q4H PRN Karmen Bongo, MD       insulin aspart (novoLOG) injection 0-5 Units  0-5 Units Subcutaneous QHS Karmen Bongo, MD       insulin aspart (novoLOG) injection 0-9 Units  0-9 Units Subcutaneous TID WC Karmen Bongo, MD   2 Units at 07/04/21 1237   loratadine (CLARITIN) tablet 10 mg  10 mg Oral Daily Karmen Bongo, MD   10 mg at 07/04/21 0920   methocarbamol (ROBAXIN) tablet 750 mg  750 mg Oral TID Karmen Bongo, MD   750 mg at 07/04/21 0920   metoprolol succinate (TOPROL-XL) 24 hr tablet 100 mg  100 mg Oral BID Domenic Polite, MD   100 mg at 07/04/21 0920   nystatin (MYCOSTATIN/NYSTOP) topical powder 1 application  1 application Topical BID Domenic Polite, MD   1 application at 43/27/61 0921   ondansetron (ZOFRAN) tablet 4 mg  4 mg Oral Q6H PRN Karmen Bongo, MD       Or   ondansetron [facility]) injection 4 mg  4 mg Intravenous Q6H PRN Karmen Bongo, MD       oxyCODONE (Oxy IR/ROXICODONE) immediate release tablet 5 mg  5 mg Oral Q4H PRN Karmen Bongo, MD       pantoprazole (PROTONIX) EC tablet 40 mg  40 mg Oral Daily Karmen Bongo, MD   40 mg at 07/04/21 0920   polyethylene glycol (MIRALAX / GLYCOLAX) packet 17 g  17 g Oral Daily PRN Karmen Bongo, MD       sodium chloride flush (NS) 0.9 % injection 3 mL  3 mL Intravenous Q12H Karmen Bongo, MD   3 mL at 07/04/21 4709   traMADol (ULTRAM) tablet 50 mg  50 mg Oral Q6H PRN Karmen Bongo, MD       Warfarin - Pharmacist Dosing Inpatient   Does not apply [address] [place], [facility]         Discharge Medications: Please see discharge summary for a list of discharge medications.  Relevant Imaging Results:  Relevant Lab Results:   Additional Information SSN- [id number], Both Covid Vaccines, 2 boosters,PI sternum upper,mid stage 3,impregnated gauge,PRN,PI Jaw mid stage 2 foam lift  dressing,dressing in place,PI heel L,R,stage 1,foam lift dressing,PRN,PI stage II,foam lift dressing,PRN,Wound incision open or dehiced,skin tear,hand,posterior,R,gauze,dressing in place  [employer], LCSWA

## 2021-07-04 NOTE — Progress Notes (Signed)
[facility] Hospital Liaison Note  Notified by [facility] of patient/family request of [facility] Paliative services.  [facility] hospital liaison will follow patient for discharge disposition.   Please call with any questions/concerns.    Thank you for the opportunity to participate in this patient's care.   Daphene Calamity, MSW [facility] Liaison  [phone]

## 2021-07-04 NOTE — Consult Note (Signed)
° °  [facility] CM Inpatient Consult   01/27/5946  Casey Patel [birth year] [id number]  [facility] [ACO] Patient: Medicare  Primary Care Provider:  [name], MD, [facility]   Patient screened for less than 78 days readmission hospitalization with noted high risk score for unplanned readmission risk. Patient is being recommended for a skilled nursing facility level of care for post hospital transition.  Plan:   [facility] PAC RN can follow for any known or needs for transitional care needs for returning to post facility care or complex disease management.  For questions or referrals, please contact:  Natividad Brood, RN BSN [facility] Liaison  [phone] business mobile phone Toll free office [phone]  Fax number: [phone] [email or web address]

## 2021-07-05 LAB — PROTIME-INR
INR: 2.7 — ABNORMAL HIGH (ref 0.8–1.2)
Prothrombin Time: 28.3 seconds — ABNORMAL HIGH (ref 11.4–15.2)

## 2021-07-05 LAB — BASIC METABOLIC PANEL
Anion gap: 7 (ref 5–15)
BUN: 24 mg/dL — ABNORMAL HIGH (ref 8–23)
CO2: 32 mmol/L (ref 22–32)
Calcium: 8.3 mg/dL — ABNORMAL LOW (ref 8.9–10.3)
Chloride: 95 mmol/L — ABNORMAL LOW (ref 98–111)
Creatinine, Ser: 0.43 mg/dL — ABNORMAL LOW (ref 0.44–1.00)
GFR, Estimated: >60 mL/min (ref >60)
Glucose, Bld: 138 mg/dL — ABNORMAL HIGH (ref 70–99)
Potassium: 3.9 mmol/L (ref 3.5–5.1)
Sodium: 134 mmol/L — ABNORMAL LOW (ref 135–145)

## 2021-07-05 LAB — GLUCOSE, CAPILLARY
Glucose-Capillary: 132 mg/dL — ABNORMAL HIGH (ref 70–99)
Glucose-Capillary: 136 mg/dL — ABNORMAL HIGH (ref 70–99)
Glucose-Capillary: 126 mg/dL — ABNORMAL HIGH (ref 70–99)
Glucose-Capillary: 130 mg/dL — ABNORMAL HIGH (ref 70–99)

## 2021-07-05 LAB — CBC
HCT: 31.3 % — ABNORMAL LOW (ref 36.0–46.0)
Hemoglobin: 9.6 g/dL — ABNORMAL LOW (ref 12.0–15.0)
MCH: 27 pg (ref 26.0–34.0)
MCHC: 30.7 g/dL (ref 30.0–36.0)
MCV: 87.9 fL (ref 80.0–100.0)
Platelets: 351 10*3/uL (ref 150–400)
RBC: 3.56 MIL/uL — ABNORMAL LOW (ref 3.87–5.11)
RDW: 18.1 % — ABNORMAL HIGH (ref 11.5–15.5)
WBC: 10.4 10*3/uL (ref 4.0–10.5)
nRBC: 0 % (ref 0.0–0.2)

## 2021-07-05 MED ORDER — WARFARIN SODIUM 2.5 MG PO TABS
2.5000 mg | ORAL_TABLET | Freq: Once | ORAL | Status: AC
  Administered 2021-07-05: 2.5 mg via ORAL
  Filled 2021-07-05: qty 1

## 2021-07-05 MED ORDER — HALOPERIDOL LACTATE 5 MG/ML IJ SOLN
2.0000 mg | Freq: Four times a day (QID) | INTRAMUSCULAR | Status: DC | PRN
  Administered 2021-07-05: 2 mg via INTRAVENOUS
  Filled 2021-07-05: qty 1

## 2021-07-05 NOTE — TOC Progression Note (Signed)
Transition of Care [facility]) - Progression Note    Patient Details  Name: Casey Patel MRN: [id number] Date of Birth: May 03, 1930  Transition of Care [facility]) CM/SW [place], LCSW Phone Number: 07/05/2021, 11:10 AM  Clinical Narrative:     CSW met with pt's sister Casey Patel and provided her with the medicare.gov listing rating sheet with the 2 bed offers have come back. So far, pt has bed offers from [facility] and [facility]. Casey Patel has declined both bed offers and wants to see what other bed offers come back.  TOC team will continue to assist with discharge planning needs.   Expected Discharge Plan: [place] Barriers to Discharge: Continued Medical Work up  Expected Discharge Plan and Services Expected Discharge Plan: [place] In-house Referral: Clinical Social Work     Living arrangements for the past 2 months: Single Family Home                                       Social Determinants of Health (SDOH) Interventions    Readmission Risk Interventions Readmission Risk Prevention Plan 07/04/2021 06/26/2021  Transportation Screening Complete Complete  PCP or Specialist Appt within 5-7 Days - Complete  Home Care Screening - Complete  Medication Review (RN CM) - Complete  HRI or Home Care Consult Complete -  Social Work Consult for Recovery Care Planning/Counseling Complete -  Palliative Care Screening Complete -  Some recent data might be hidden

## 2021-07-05 NOTE — Plan of Care (Signed)
  Problem: Cardiac: Goal: Ability to achieve and maintain adequate cardiopulmonary perfusion will improve Outcome: Progressing   

## 2021-07-05 NOTE — Progress Notes (Signed)
Haldol given for agitation and pulling lines and tubes. Pt sleeping. HR = 112 in afib. Sats = 95% on O2 @ 2L [place]. Report given to oncoming.

## 2021-07-05 NOTE — Progress Notes (Signed)
ANTICOAGULATION CONSULT NOTE - Follow Up Consult  Pharmacy Consult for Warfarin Indication: atrial fibrillation  Allergies  Allergen Reactions   Wheat Bran Shortness Of Breath   Kiwi Extract Other (See Comments)    Causes mouth sores   Orange Fruit [Citrus] Other (See Comments)    Causes mouth sores   Phenergan [Promethazine] Other (See Comments)    Disorientation    Pineapple Other (See Comments)    Causes mouth sores    Promethazine Hcl Other (See Comments)    Disorientation    Tomato Other (See Comments)    Causes mouth sores   Tylenol [Acetaminophen] Other (See Comments)    Causes fast heart rate   Vistaril [Hydroxyzine Hcl] Other (See Comments)    Confusion    Codeine Itching and Rash   Tape Rash and Other (See Comments)    Blisters on skin    Patient Measurements: Height: 5\' 6"  (167.6 cm) Weight: 85.6 kg (188 lb 11.4 oz) IBW/kg (Calculated) : 59.3  Vital Signs: Temp: 97.6 F (36.4 C) (01/07 0450) Temp Source: Axillary (01/07 0450) BP: 119/73 (01/07 0450) Pulse Rate: 86 (01/07 0450)  Labs: Recent Labs    07/02/21 1155 07/02/21 1400 07/03/21 0241 07/03/21 1559 07/04/21 0209 07/05/21 0238  HGB 9.4*  --  9.5*  --  9.3* 9.6*  HCT 31.7*  --  32.3*  --  30.3* 31.3*  PLT 329  --  331  --  352 351  LABPROT 24.0*  --   --  24.5* 28.6* 28.3*  INR 2.2*  --   --  2.2* 2.7* 2.7*  CREATININE 0.63  --  0.61  --  0.46 0.43*  TROPONINIHS 18* 19*  --   --   --   --     Estimated Creatinine Clearance: 52.5 mL/min (A) (by C-G formula based on SCr of 0.43 mg/dL (L)).   Medications:  Scheduled:   digoxin  0.125 mg Oral Daily   docusate sodium  100 mg Oral BID   fluticasone furoate-vilanterol  1 puff Inhalation QHS   insulin aspart  0-5 Units Subcutaneous QHS   insulin aspart  0-9 Units Subcutaneous TID WC   loratadine  10 mg Oral Daily   methocarbamol  750 mg Oral TID   metoprolol succinate  100 mg Oral BID   nystatin  1 application Topical BID    pantoprazole  40 mg Oral Daily   sodium chloride flush  3 mL Intravenous Q12H   Warfarin - Pharmacist Dosing Inpatient   Does not apply q1600    Assessment: 88 yo F admitted with weakness. Past medical history significant for Afib with RVR and chronically anticoagulated with warfarin. Last PTA warfarin dose 07/01/2021. Pharmacy consulted for warfarin dosing.   INR today is therapeutic at 2.7. CBC stable. No signs/symptoms of bleeding noted per RN.    PTA warfarin regimen: 2.5 mg Sun-Tues-Wed-Thurs-Sat, None Mon and Fri  Goal of Therapy:  INR 2-3 Monitor platelets by anticoagulation protocol: Yes   Plan:  Warfarin 2.5 mg PO x 1. Daily INR. Monitor CBC and for signs/symptoms of bleeding.    Vance Peper, PharmD PGY1 Pharmacy Resident Phone [phone] 07/05/2021 9:45 AM   Please check AMION for all [place] phone numbers After 10:00 PM, call [place] [phone]

## 2021-07-05 NOTE — Progress Notes (Signed)
PROGRESS NOTE    Casey Patel  VQQ:[id number] DOB: 01-20-1930 DOA: 07/02/2021 PCP: [name], MD  Brief Narrative: 88/F with general debility, hypertension, diabetes mellitus, history of atrial fibrillation recently hospitalized with UTI, sepsis, on 12/30, family declined SNF options and went home instead, came back to the ER on 1/2 with A. fib RVR, cardiology recommended changing her Toprol 1 from 150 mg daily to 100 mg twice daily. Subsequently patient has been weaker than usual and less able to participate in ADLs, heart rate has been persistently elevated and was brought back to the ER  Assessment & Plan:   Atrial fibrillation with RVR ([place])- (present on admission) -Long history of A. fib presenting with persistent A. fib RVR  -Recently Toprol XL dose was increased to 100 mg twice daily  -Toprol resumed, 100 mg twice daily -Wean off Cardizem drip today -Loaded with digoxin yesterday, continue p.o. dig today -2D echo with EF of 45-50%, no regional wall motion abnormalities, moderate PAH, given advanced age, debility she is not a candidate for further work-up, remains euvolemic -Coumadin continued per pharmacy -Palliative consulted for goals of care as well -PT/OT eval completed, SNF recommended, Toc consulted -SNF on Monday if heart rate remains stable   HTN -Toprol resumed, blood pressures soft, monitor   DM -Last A1c was 6.0 -Glucophage on hold, continue sensitive sliding scale  Cognitive deficits, intermittent confusion -Stable, now   Goals of care, counseling/discussion -Discussed with sister yesterday, she is DNR, at risk for frequent rehospitalizations and ongoing decline -Palliative consulted for goals of care discussions   General debility, failure to thrive   DVT prophylaxis: Coumadin Code Status: DNR Family Communication: Updated Sister 1/7 Disposition Plan:  Status is: Inpatient  Remains inpatient appropriate because: Severity of  illness  Consultants:    Procedures:   Antimicrobials:    Subjective: -No events overnight, heart rate improving, mild intermittent confusion  Objective: Vitals:   07/04/21 1640 07/04/21 1942 07/05/21 0450 07/05/21 1148  BP: 111/67 115/67 119/73 104/72  Pulse: 93 96 86 93  Resp: 18  18 20   Temp: 99.1 F (37.3 C) 97.7 F (36.5 C) 97.6 F (36.4 C) 97.9 F (36.6 C)  TempSrc: Axillary Oral Axillary Oral  SpO2: 94% 93% 91%   Weight:      Height:        Intake/Output Summary (Last 24 hours) at 07/05/2021 1323 Last data filed at 07/05/2021 0500 Gross per 24 hour  Intake 259.59 ml  Output 150 ml  Net 109.59 ml   Filed Weights   07/04/21 0438  Weight: 85.6 kg    Examination:  General exam: Elderly chronically ill female sitting up in bed, legs flexed, awake alert oriented to self and place, cognitive deficits noted  CVS: S1-S2, irregularly irregular rhythm Lungs: Improved air movement, decreased at the bases Abdomen: Soft, nontender, bowel sounds present Extremities: No edema  Psychiatry: Flat affect  Data Reviewed:   CBC: Recent Labs  Lab 06/30/21 1755 07/02/21 1155 07/03/21 0241 07/04/21 0209 07/05/21 0238  WBC 7.6 10.3 9.0 10.3 10.4  NEUTROABS  --  9.0*  --   --   --   HGB 9.1* 9.4* 9.5* 9.3* 9.6*  HCT 30.8* 31.7* 32.3* 30.3* 31.3*  MCV 89.3 91.1 90.5 88.3 87.9  PLT 292 329 331 352 433   Basic Metabolic Panel: Recent Labs  Lab 06/30/21 1755 07/02/21 1155 07/03/21 0241 07/04/21 0209 07/05/21 0238  NA 136 139 137 134* 134*  K 4.9 4.9 4.3 4.2  3.9  CL 100 102 99 98 95*  CO2 30 29 32 28 32  GLUCOSE 149* 158* 130* 150* 138*  BUN 29* 32* 31* 30* 24*  CREATININE 0.50 0.63 0.61 0.46 0.43*  CALCIUM 8.3* 8.2* 8.3* 8.0* 8.3*  MG 1.8  --   --   --   --    GFR: Estimated Creatinine Clearance: 52.5 mL/min (A) (by C-G formula based on SCr of 0.43 mg/dL (L)). Liver Function Tests: Recent Labs  Lab 07/02/21 1155  AST 28  ALT 19  ALKPHOS 68  BILITOT  0.7  PROT 5.8*  ALBUMIN 2.3*   Recent Labs  Lab 07/02/21 1155  LIPASE 20   No results for input(s): AMMONIA in the last 168 hours. Coagulation Profile: Recent Labs  Lab 06/30/21 1755 07/02/21 1155 07/03/21 1559 07/04/21 0209 07/05/21 0238  INR 2.8* 2.2* 2.2* 2.7* 2.7*   Cardiac Enzymes: No results for input(s): CKTOTAL, CKMB, CKMBINDEX, TROPONINI in the last 168 hours. BNP (last 3 results) No results for input(s): PROBNP in the last 8760 hours. HbA1C: No results for input(s): HGBA1C in the last 72 hours. CBG: Recent Labs  Lab 07/04/21 1135 07/04/21 1638 07/04/21 2205 07/05/21 0738 07/05/21 1150  GLUCAP 169* 138* 137* 132* 136*   Lipid Profile: No results for input(s): CHOL, HDL, LDLCALC, TRIG, CHOLHDL, LDLDIRECT in the last 72 hours. Thyroid Function Tests: Recent Labs    07/03/21 1559  TSH 1.097   Anemia Panel: No results for input(s): VITAMINB12, FOLATE, FERRITIN, TIBC, IRON, RETICCTPCT in the last 72 hours. Urine analysis:    Component Value Date/Time   COLORURINE AMBER (A) 07/02/2021 1400   APPEARANCEUR HAZY (A) 07/02/2021 1400   LABSPEC 1.028 07/02/2021 1400   PHURINE 5.0 07/02/2021 1400   GLUCOSEU NEGATIVE 07/02/2021 1400   HGBUR NEGATIVE 07/02/2021 1400   BILIRUBINUR NEGATIVE 07/02/2021 1400   KETONESUR NEGATIVE 07/02/2021 1400   PROTEINUR 30 (A) 07/02/2021 1400   NITRITE NEGATIVE 07/02/2021 1400   LEUKOCYTESUR TRACE (A) 07/02/2021 1400   Sepsis Labs: @LABRCNTIP (procalcitonin:4,lacticidven:4)  ) Recent Results (from the past 240 hour(s))  Urine Culture     Status: Abnormal   Collection Time: 06/26/21  8:50 AM   Specimen: Urine, Clean Catch  Result Value Ref Range Status   Specimen Description   Final    URINE, CLEAN CATCH Performed at [facility], [place] [address] [place], [place] [address]    Special Requests   Final    NONE Performed at [facility], [place] [address], [place], [place] [address]     Culture (A)  Final    <10,000 COLONIES/mL INSIGNIFICANT GROWTH Performed at [facility] [address], [place], [place] [address]    Report Status 06/27/2021 FINAL  Final  SARS CORONAVIRUS 2 (TAT 6-24 HRS) Nasopharyngeal Nasopharyngeal Swab     Status: None   Collection Time: 06/27/21  9:35 AM   Specimen: Nasopharyngeal Swab  Result Value Ref Range Status   SARS Coronavirus 2 NEGATIVE NEGATIVE Final    Comment: (NOTE) SARS-CoV-2 target nucleic acids are NOT DETECTED.  The SARS-CoV-2 RNA is generally detectable in upper and lower respiratory specimens during the acute phase of infection. Negative results do not preclude SARS-CoV-2 infection, do not rule out co-infections with other pathogens, and should not be used as the sole basis for treatment or other patient management decisions. Negative results must be combined with clinical observations, patient history, and epidemiological information. The expected result is Negative.  Fact  Sheet for Patients: SugarRoll.be  Fact Sheet for Healthcare Providers: https://www.woods-mathews.com/  This test is not yet approved or cleared by the [place] FDA and  has been authorized for detection and/or diagnosis of SARS-CoV-2 by FDA under an Emergency Use Authorization (EUA). This EUA will remain  in effect (meaning this test can be used) for the duration of the COVID-19 declaration under Se ction 564(b)(1) of the Act, 21 U.S.C. section 360bbb-3(b)(1), unless the authorization is terminated or revoked sooner.  Performed at [facility], [place] [address], [place], [place] [address]   Urine Culture     Status: None   Collection Time: 06/30/21  1:30 PM   Specimen: In/Out Cath Urine  Result Value Ref Range Status   Specimen Description   Final    IN/OUT CATH URINE Performed at [facility], [address], [place], [place] [address]    Special Requests   Final    NONE Performed  at [facility], [address], [place], [place] [address]    Culture   Final    NO GROWTH Performed at [facility], [place] [address], [place], [place] [address]    Report Status 07/02/2021 FINAL  Final  Resp Panel by RT-PCR (Flu A&B, Covid) Nasopharyngeal Swab     Status: None   Collection Time: 07/02/21 11:35 AM   Specimen: Nasopharyngeal Swab; Nasopharyngeal(NP) swabs in vial transport medium  Result Value Ref Range Status   SARS Coronavirus 2 by RT PCR NEGATIVE NEGATIVE Final    Comment: (NOTE) SARS-CoV-2 target nucleic acids are NOT DETECTED.  The SARS-CoV-2 RNA is generally detectable in upper respiratory specimens during the acute phase of infection. The lowest concentration of SARS-CoV-2 viral copies this assay can detect is 138 copies/mL. A negative result does not preclude SARS-Cov-2 infection and should not be used as the sole basis for treatment or other patient management decisions. A negative result may occur with  improper specimen collection/handling, submission of specimen other than nasopharyngeal swab, presence of viral mutation(s) within the areas targeted by this assay, and inadequate number of viral copies(<138 copies/mL). A negative result must be combined with clinical observations, patient history, and epidemiological information. The expected result is Negative.  Fact Sheet for Patients:  EntrepreneurPulse.com.au  Fact Sheet for Healthcare Providers:  IncredibleEmployment.be  This test is no t yet approved or cleared by the [place] FDA and  has been authorized for detection and/or diagnosis of SARS-CoV-2 by FDA under an Emergency Use Authorization (EUA). This EUA will remain  in effect (meaning this test can be used) for the duration of the COVID-19 declaration under Section 564(b)(1) of the Act, 21 U.S.C.section 360bbb-3(b)(1), unless the authorization is terminated  or revoked sooner.       Influenza  A by PCR NEGATIVE NEGATIVE Final   Influenza B by PCR NEGATIVE NEGATIVE Final    Comment: (NOTE) The Xpert Xpress SARS-CoV-2/FLU/RSV plus assay is intended as an aid in the diagnosis of influenza from Nasopharyngeal swab specimens and should not be used as a sole basis for treatment. Nasal washings and aspirates are unacceptable for Xpert Xpress SARS-CoV-2/FLU/RSV testing.  Fact Sheet for Patients: EntrepreneurPulse.com.au  Fact Sheet for Healthcare Providers: IncredibleEmployment.be  This test is not yet approved or cleared by the [place] FDA and has been authorized for detection and/or diagnosis of SARS-CoV-2 by FDA under an Emergency Use Authorization (EUA). This EUA will remain in effect (meaning this test can be used) for the duration of the  COVID-19 declaration under Section 564(b)(1) of the Act, 21 U.S.C. section 360bbb-3(b)(1), unless the authorization is terminated or revoked.  Performed at [facility], [place] [address], [place], [place] [address]      Radiology Studies: ECHOCARDIOGRAM COMPLETE  Result Date: 07/04/2021    ECHOCARDIOGRAM REPORT   Patient Name:   KARALYNE NUSSER Date of Exam: 07/04/2021 Medical Rec #:  [id number]    Height:       66.0 in Accession #:    [id number]   Weight:       188.7 lb Date of Birth:  April 25, 1930     BSA:          1.951 m Patient Age:    46 years     BP:           107/64 mmHg Patient Gender: F            HR:           91 bpm. Exam Location:  Inpatient Procedure: 2D Echo, Color Doppler and Cardiac Doppler Indications:    R06.9 DOE  History:        Patient has prior history of Echocardiogram examinations, most                 recent 03/08/2018. Arrythmias:Atrial Fibrillation; Risk                 Factors:Hypertension, Diabetes and Dyslipidemia.  Sonographer:    Raquel Sarna Senior RDCS Referring Phys: [place]  Sonographer Comments: VERY lateral windows, technically difficult due to significant kyphosis.  IMPRESSIONS  1. Left ventricular ejection fraction, by estimation, is 45 to 50%. The left ventricle has mildly decreased function. The left ventricle demonstrates regional wall motion abnormalities (see scoring diagram/findings for description). Left ventricular diastolic parameters are indeterminate.  2. Right ventricular systolic function is normal. The right ventricular size is normal. There is moderately elevated pulmonary artery systolic pressure.  3. Left atrial size was severely dilated.  4. Right atrial size was severely dilated.  5. The mitral valve is normal in structure. Mild mitral valve regurgitation. No evidence of mitral stenosis.  6. Tricuspid valve regurgitation is mild to moderate.  7. The aortic valve is tricuspid. There is mild calcification of the aortic valve. There is mild thickening of the aortic valve. Aortic valve regurgitation is not visualized. Aortic valve sclerosis is present, with no evidence of aortic valve stenosis.  8. The inferior vena cava is normal in size with <50% respiratory variability, suggesting right atrial pressure of 8 mmHg. FINDINGS  Left Ventricle: Left ventricular ejection fraction, by estimation, is 45 to 50%. The left ventricle has mildly decreased function. The left ventricle demonstrates regional wall motion abnormalities. The left ventricular internal cavity size was normal in size. There is no left ventricular hypertrophy. Left ventricular diastolic parameters are indeterminate.  LV Wall Scoring: The entire anterior septum and entire inferior wall are hypokinetic. The entire anterior wall, entire lateral wall, mid inferoseptal segment, basal inferoseptal segment, and apex are normal. Right Ventricle: The right ventricular size is normal. No increase in right ventricular wall thickness. Right ventricular systolic function is normal. There is moderately elevated pulmonary artery systolic pressure. The tricuspid regurgitant velocity is 3.06 m/s, and with an assumed  right atrial pressure of 8 mmHg, the estimated right ventricular systolic pressure is 75.6 mmHg. Left Atrium: Left atrial size was severely dilated. Right Atrium: Right atrial size was severely dilated. Pericardium: There is no evidence of pericardial effusion. Mitral  Valve: The mitral valve is normal in structure. Mild mitral valve regurgitation. No evidence of mitral valve stenosis. Tricuspid Valve: The tricuspid valve is normal in structure. Tricuspid valve regurgitation is mild to moderate. No evidence of tricuspid stenosis. Aortic Valve: The aortic valve is tricuspid. There is mild calcification of the aortic valve. There is mild thickening of the aortic valve. Aortic valve regurgitation is not visualized. Aortic valve sclerosis is present, with no evidence of aortic valve stenosis. Pulmonic Valve: The pulmonic valve was normal in structure. Pulmonic valve regurgitation is trivial. No evidence of pulmonic stenosis. Aorta: The aortic root is normal in size and structure. Venous: The inferior vena cava is normal in size with less than 50% respiratory variability, suggesting right atrial pressure of 8 mmHg. IAS/Shunts: No atrial level shunt detected by color flow Doppler.  LEFT VENTRICLE PLAX 2D LVIDd:         4.20 cm LVIDs:         2.80 cm LV PW:         0.90 cm LV IVS:        0.80 cm LVOT diam:     2.00 cm     3D Volume EF: LV SV:         39          3D EF:        44 % LV SV Index:   20          LV EDV:       87 ml LVOT Area:     3.14 cm    LV ESV:       49 ml                            LV SV:        39 ml  LV Volumes (MOD) LV vol d, MOD A2C: 69.0 ml LV vol d, MOD A4C: 60.3 ml LV vol s, MOD A2C: 42.1 ml LV vol s, MOD A4C: 33.3 ml LV SV MOD A2C:     26.9 ml LV SV MOD A4C:     60.3 ml LV SV MOD BP:      28.6 ml RIGHT VENTRICLE RV S prime:     9.79 cm/s TAPSE (M-mode): 1.6 cm LEFT ATRIUM              Index        RIGHT ATRIUM           Index LA diam:        4.10 cm  2.10 cm/m   RA Area:     39.70 cm LA Vol  (A2C):   108.0 ml 55.36 ml/m  RA Volume:   157.00 ml 80.47 ml/m LA Vol (A4C):   66.3 ml  33.98 ml/m LA Biplane Vol: 86.4 ml  44.28 ml/m  AORTIC VALVE LVOT Vmax:   67.00 cm/s LVOT Vmean:  45.000 cm/s LVOT VTI:    0.125 m  AORTA Ao Root diam: 3.40 cm Ao Asc diam:  3.40 cm TRICUSPID VALVE TR Peak grad:   37.5 mmHg TR Vmax:        306.00 cm/s  SHUNTS Systemic VTI:  0.12 m Systemic Diam: 2.00 cm Skeet Latch MD Electronically signed by Skeet Latch MD Signature Date/Time: 07/04/2021/2:34:12 PM    Final     Scheduled Meds:  digoxin  0.125 mg Oral Daily   docusate sodium  100 mg Oral BID  fluticasone furoate-vilanterol  1 puff Inhalation QHS   insulin aspart  0-5 Units Subcutaneous QHS   insulin aspart  0-9 Units Subcutaneous TID WC   loratadine  10 mg Oral Daily   methocarbamol  750 mg Oral TID   metoprolol succinate  100 mg Oral BID   nystatin  1 application Topical BID   pantoprazole  40 mg Oral Daily   sodium chloride flush  3 mL Intravenous Q12H   warfarin  2.5 mg Oral ONCE-1600   Warfarin - Pharmacist Dosing Inpatient   Does not apply q1600   Continuous Infusions:  diltiazem (CARDIZEM) infusion Stopped (07/05/21 1055)     LOS: 3 days    Time spent: 62min  Domenic Polite, MD [facility]   07/05/2021, 1:23 PM

## 2021-07-05 NOTE — Progress Notes (Signed)
Pt with increased agitation and pulling at lines and removing O2 nasal cannula repeatedlly despite attempts to reorient. O2 sats drop to 70's and 80's on room air. Dr Broadus John notified. Order received for Haldol IV.

## 2021-07-06 ENCOUNTER — Inpatient Hospital Stay (HOSPITAL_COMMUNITY): Payer: Medicare Other

## 2021-07-06 DIAGNOSIS — Z515 Encounter for palliative care: Secondary | ICD-10-CM

## 2021-07-06 DIAGNOSIS — R531 Weakness: Secondary | ICD-10-CM

## 2021-07-06 DIAGNOSIS — Z7189 Other specified counseling: Secondary | ICD-10-CM

## 2021-07-06 LAB — URINALYSIS, ROUTINE W REFLEX MICROSCOPIC
Bilirubin Urine: NEGATIVE
Glucose, UA: NEGATIVE mg/dL
Hgb urine dipstick: NEGATIVE
Ketones, ur: NEGATIVE mg/dL
Nitrite: NEGATIVE
Protein, ur: NEGATIVE mg/dL
Specific Gravity, Urine: 1.02 (ref 1.005–1.030)
pH: 6.5 (ref 5.0–8.0)

## 2021-07-06 LAB — GLUCOSE, CAPILLARY
Glucose-Capillary: 119 mg/dL — ABNORMAL HIGH (ref 70–99)
Glucose-Capillary: 195 mg/dL — ABNORMAL HIGH (ref 70–99)
Glucose-Capillary: 143 mg/dL — ABNORMAL HIGH (ref 70–99)
Glucose-Capillary: 128 mg/dL — ABNORMAL HIGH (ref 70–99)

## 2021-07-06 LAB — CBC
HCT: 30.2 % — ABNORMAL LOW (ref 36.0–46.0)
Hemoglobin: 9.6 g/dL — ABNORMAL LOW (ref 12.0–15.0)
MCH: 27.5 pg (ref 26.0–34.0)
MCHC: 31.8 g/dL (ref 30.0–36.0)
MCV: 86.5 fL (ref 80.0–100.0)
Platelets: 380 10*3/uL (ref 150–400)
RBC: 3.49 MIL/uL — ABNORMAL LOW (ref 3.87–5.11)
RDW: 18.2 % — ABNORMAL HIGH (ref 11.5–15.5)
WBC: 15 10*3/uL — ABNORMAL HIGH (ref 4.0–10.5)
nRBC: 0 % (ref 0.0–0.2)

## 2021-07-06 LAB — BASIC METABOLIC PANEL
Anion gap: 4 — ABNORMAL LOW (ref 5–15)
BUN: 21 mg/dL (ref 8–23)
CO2: 34 mmol/L — ABNORMAL HIGH (ref 22–32)
Calcium: 7.9 mg/dL — ABNORMAL LOW (ref 8.9–10.3)
Chloride: 98 mmol/L (ref 98–111)
Creatinine, Ser: 0.47 mg/dL (ref 0.44–1.00)
GFR, Estimated: >60 mL/min (ref >60)
Glucose, Bld: 136 mg/dL — ABNORMAL HIGH (ref 70–99)
Potassium: 4.3 mmol/L (ref 3.5–5.1)
Sodium: 136 mmol/L (ref 135–145)

## 2021-07-06 LAB — URINALYSIS, MICROSCOPIC (REFLEX): Bacteria, UA: NONE SEEN

## 2021-07-06 LAB — PROTIME-INR
INR: 2.3 — ABNORMAL HIGH (ref 0.8–1.2)
Prothrombin Time: 25.2 seconds — ABNORMAL HIGH (ref 11.4–15.2)

## 2021-07-06 MED ORDER — GUAIFENESIN-DM 100-10 MG/5ML PO SYRP
5.0000 mL | ORAL_SOLUTION | ORAL | Status: DC | PRN
  Administered 2021-07-06: 5 mL via ORAL
  Filled 2021-07-06: qty 5

## 2021-07-06 MED ORDER — WARFARIN SODIUM 2.5 MG PO TABS
2.5000 mg | ORAL_TABLET | Freq: Once | ORAL | Status: AC
  Administered 2021-07-06: 2.5 mg via ORAL
  Filled 2021-07-06: qty 1

## 2021-07-06 NOTE — Progress Notes (Signed)
PROGRESS NOTE    Casey Patel  UUV:[id number] DOB: 01-22-1930 DOA: 07/02/2021 PCP: [name], MD  Brief Narrative: 88/F with general debility, hypertension, diabetes mellitus, history of atrial fibrillation recently hospitalized with UTI, sepsis, on 12/30, family declined SNF options and went home instead, came back to the ER on 1/2 with A. fib RVR, cardiology recommended changing her Toprol 1 from 150 mg daily to 100 mg twice daily. Subsequently patient has been weaker than usual and less able to participate in ADLs, heart rate has been persistently elevated and was brought back to the ER  Assessment & Plan:   Atrial fibrillation with RVR ([place])- (present on admission) -Long history of A. fib presenting with persistent A. fib RVR  -Recently Toprol XL dose was increased to 100 mg twice daily  -Toprol resumed, 100 mg twice daily -Weaned off Cardizem drip yesterday -Started on digoxin, heart rate improving -2D echo with EF of 45-50%, no regional wall motion abnormalities, moderate PAH, given advanced age, debility she is not a candidate for further work-up, remains euvolemic -Coumadin continued per pharmacy -Palliative consulted for goals of care as well -PT/OT eval completed, SNF recommended, -Discharge planning  Leukocytosis -Will check urinalysis and chest x-ray   HTN -Toprol resumed, blood pressures soft, monitor   DM -Last A1c was 6.0 -Glucophage on hold, continue sensitive sliding scale  Cognitive deficits, intermittent confusion -Stable, now   Goals of care, counseling/discussion -Discussed with sister, she is DNR, at risk for frequent rehospitalizations and ongoing decline -Palliative consulted for goals of care discussions   General debility, failure to thrive   DVT prophylaxis: Coumadin Code Status: DNR Family Communication: Updated Sister 1/7 Disposition Plan:  Status is: Inpatient  Remains inpatient appropriate because: Severity of  illness  Consultants:    Procedures:   Antimicrobials:    Subjective: -Some confusion overnight, mentation better  Objective: Vitals:   07/06/21 0412 07/06/21 0738 07/06/21 0800 07/06/21 0830  BP: (!) 153/130 (!) 109/55    Pulse: 93 92 83 84  Resp: 18 17    Temp: 98.5 F (36.9 C) 98.5 F (36.9 C)    TempSrc: Axillary Axillary    SpO2: 94% 96% 97% 98%  Weight: 79.1 kg     Height:        Intake/Output Summary (Last 24 hours) at 07/06/2021 1344 Last data filed at 07/06/2021 1035 Gross per 24 hour  Intake 60 ml  Output 300 ml  Net -240 ml   Filed Weights   07/04/21 0438 07/06/21 0412  Weight: 85.6 kg 79.1 kg    Examination:  General exam: Elderly chronically ill female, sitting up in bed, AAO x2, cognitive deficits noted  CVS: S1-S2, irregularly irregular rhythm Lungs: Improved air movement, decreased breath sounds at bases Abdomen: Soft, nontender, bowel sounds present Extremities: No edema  Psychiatry: Flat affect  Data Reviewed:   CBC: Recent Labs  Lab 07/02/21 1155 07/03/21 0241 07/04/21 0209 07/05/21 0238 07/06/21 0321  WBC 10.3 9.0 10.3 10.4 15.0*  NEUTROABS 9.0*  --   --   --   --   HGB 9.4* 9.5* 9.3* 9.6* 9.6*  HCT 31.7* 32.3* 30.3* 31.3* 30.2*  MCV 91.1 90.5 88.3 87.9 86.5  PLT 329 331 352 351 474   Basic Metabolic Panel: Recent Labs  Lab 06/30/21 1755 07/02/21 1155 07/03/21 0241 07/04/21 0209 07/05/21 0238 07/06/21 0321  NA 136 139 137 134* 134* 136  K 4.9 4.9 4.3 4.2 3.9 4.3  CL 100 102 99 98 95*  98  CO2 30 29 32 28 32 34*  GLUCOSE 149* 158* 130* 150* 138* 136*  BUN 29* 32* 31* 30* 24* 21  CREATININE 0.50 0.63 0.61 0.46 0.43* 0.47  CALCIUM 8.3* 8.2* 8.3* 8.0* 8.3* 7.9*  MG 1.8  --   --   --   --   --    GFR: Estimated Creatinine Clearance: 50.6 mL/min (by C-G formula based on SCr of 0.47 mg/dL). Liver Function Tests: Recent Labs  Lab 07/02/21 1155  AST 28  ALT 19  ALKPHOS 68  BILITOT 0.7  PROT 5.8*  ALBUMIN 2.3*    Recent Labs  Lab 07/02/21 1155  LIPASE 20   No results for input(s): AMMONIA in the last 168 hours. Coagulation Profile: Recent Labs  Lab 07/02/21 1155 07/03/21 1559 07/04/21 0209 07/05/21 0238 07/06/21 0321  INR 2.2* 2.2* 2.7* 2.7* 2.3*   Cardiac Enzymes: No results for input(s): CKTOTAL, CKMB, CKMBINDEX, TROPONINI in the last 168 hours. BNP (last 3 results) No results for input(s): PROBNP in the last 8760 hours. HbA1C: No results for input(s): HGBA1C in the last 72 hours. CBG: Recent Labs  Lab 07/05/21 1150 07/05/21 1619 07/05/21 2155 07/06/21 0737 07/06/21 1137  GLUCAP 136* 126* 130* 119* 195*   Lipid Profile: No results for input(s): CHOL, HDL, LDLCALC, TRIG, CHOLHDL, LDLDIRECT in the last 72 hours. Thyroid Function Tests: Recent Labs    07/03/21 1559  TSH 1.097   Anemia Panel: No results for input(s): VITAMINB12, FOLATE, FERRITIN, TIBC, IRON, RETICCTPCT in the last 72 hours. Urine analysis:    Component Value Date/Time   COLORURINE YELLOW 07/06/2021 [place] 07/06/2021 1152   LABSPEC 1.020 07/06/2021 1152   PHURINE 6.5 07/06/2021 1152   GLUCOSEU NEGATIVE 07/06/2021 1152   HGBUR NEGATIVE 07/06/2021 1152   [place] 07/06/2021 1152   [place] 07/06/2021 1152   PROTEINUR NEGATIVE 07/06/2021 1152   NITRITE NEGATIVE 07/06/2021 1152   LEUKOCYTESUR TRACE (A) 07/06/2021 1152   Sepsis Labs: @LABRCNTIP (procalcitonin:4,lacticidven:4)  ) Recent Results (from the past 240 hour(s))  SARS CORONAVIRUS 2 (TAT 6-24 HRS) Nasopharyngeal Nasopharyngeal Swab     Status: None   Collection Time: 06/27/21  9:35 AM   Specimen: Nasopharyngeal Swab  Result Value Ref Range Status   SARS Coronavirus 2 NEGATIVE NEGATIVE Final    Comment: (NOTE) SARS-CoV-2 target nucleic acids are NOT DETECTED.  The SARS-CoV-2 RNA is generally detectable in upper and lower respiratory specimens during the acute phase of infection. Negative results  do not preclude SARS-CoV-2 infection, do not rule out co-infections with other pathogens, and should not be used as the sole basis for treatment or other patient management decisions. Negative results must be combined with clinical observations, patient history, and epidemiological information. The expected result is Negative.  Fact Sheet for Patients: SugarRoll.be  Fact Sheet for Healthcare Providers: https://www.woods-mathews.com/  This test is not yet approved or cleared by the [place] FDA and  has been authorized for detection and/or diagnosis of SARS-CoV-2 by FDA under an Emergency Use Authorization (EUA). This EUA will remain  in effect (meaning this test can be used) for the duration of the COVID-19 declaration under Se ction 564(b)(1) of the Act, 21 U.S.C. section 360bbb-3(b)(1), unless the authorization is terminated or revoked sooner.  Performed at [facility], [place] [address], [place], [place] [address]   Urine Culture     Status: None   Collection Time: 06/30/21  1:30 PM   Specimen: In/Out Cath  Urine  Result Value Ref Range Status   Specimen Description   Final    IN/OUT CATH URINE Performed at [facility], [address], [place], [place] [address]    Special Requests   Final    NONE Performed at [facility], [address] [place], [place] [address]    Culture   Final    NO GROWTH Performed at [facility], [place] [address] [place], [place] [address]    Report Status 07/02/2021 FINAL  Final  Resp Panel by RT-PCR (Flu A&B, Covid) Nasopharyngeal Swab     Status: None   Collection Time: 07/02/21 11:35 AM   Specimen: Nasopharyngeal Swab; Nasopharyngeal(NP) swabs in vial transport medium  Result Value Ref Range Status   SARS Coronavirus 2 by RT PCR NEGATIVE NEGATIVE Final    Comment: (NOTE) SARS-CoV-2 target nucleic acids are NOT DETECTED.  The SARS-CoV-2 RNA is generally detectable in upper  respiratory specimens during the acute phase of infection. The lowest concentration of SARS-CoV-2 viral copies this assay can detect is 138 copies/mL. A negative result does not preclude SARS-Cov-2 infection and should not be used as the sole basis for treatment or other patient management decisions. A negative result may occur with  improper specimen collection/handling, submission of specimen other than nasopharyngeal swab, presence of viral mutation(s) within the areas targeted by this assay, and inadequate number of viral copies(<138 copies/mL). A negative result must be combined with clinical observations, patient history, and epidemiological information. The expected result is Negative.  Fact Sheet for Patients:  EntrepreneurPulse.com.au  Fact Sheet for Healthcare Providers:  IncredibleEmployment.be  This test is no t yet approved or cleared by the [place] FDA and  has been authorized for detection and/or diagnosis of SARS-CoV-2 by FDA under an Emergency Use Authorization (EUA). This EUA will remain  in effect (meaning this test can be used) for the duration of the COVID-19 declaration under Section 564(b)(1) of the Act, 21 U.S.C.section 360bbb-3(b)(1), unless the authorization is terminated  or revoked sooner.       Influenza A by PCR NEGATIVE NEGATIVE Final   Influenza B by PCR NEGATIVE NEGATIVE Final    Comment: (NOTE) The Xpert Xpress SARS-CoV-2/FLU/RSV plus assay is intended as an aid in the diagnosis of influenza from Nasopharyngeal swab specimens and should not be used as a sole basis for treatment. Nasal washings and aspirates are unacceptable for Xpert Xpress SARS-CoV-2/FLU/RSV testing.  Fact Sheet for Patients: EntrepreneurPulse.com.au  Fact Sheet for Healthcare Providers: IncredibleEmployment.be  This test is not yet approved or cleared by the [place] FDA and has been  authorized for detection and/or diagnosis of SARS-CoV-2 by FDA under an Emergency Use Authorization (EUA). This EUA will remain in effect (meaning this test can be used) for the duration of the COVID-19 declaration under Section 564(b)(1) of the Act, 21 U.S.C. section 360bbb-3(b)(1), unless the authorization is terminated or revoked.  Performed at [facility], [place] [address], [place], [place] [address]      Radiology Studies: DG CHEST PORT 1 VIEW  Result Date: 07/06/2021 CLINICAL DATA:  Chief complaint-atrial fibrillation and weakness Reason for exam: leucocytosis EXAM: PORTABLE CHEST 1 VIEW COMPARISON:  07/02/2021. FINDINGS: Cardiac silhouette mildly enlarged. Patchy right mid lung airspace opacity in bilateral lung base opacities. No significant change from the prior exam allowing for differences in patient positioning and technique. No gross pneumothorax. IMPRESSION: 1. Limited study. Persistent lung base opacities consistent with a combination of pleural effusions and atelectasis/consolidation.  Additional small area of atelectasis or consolidation in the right mid lung. No convincing pulmonary edema. No significant change from the most recent prior study. Electronically Signed   By: Lajean Manes M.D.   On: 07/06/2021 08:35   ECHOCARDIOGRAM COMPLETE  Result Date: 07/04/2021    ECHOCARDIOGRAM REPORT   Patient Name:   Casey Patel Date of Exam: 07/04/2021 Medical Rec #:  [id number]    Height:       66.0 in Accession #:    [id number]   Weight:       188.7 lb Date of Birth:  [birth year]     BSA:          1.951 m Patient Age:    59 years     BP:           107/64 mmHg Patient Gender: F            HR:           91 bpm. Exam Location:  Inpatient Procedure: 2D Echo, Color Doppler and Cardiac Doppler Indications:    R06.9 DOE  History:        Patient has prior history of Echocardiogram examinations, most                 recent 03/08/2018. Arrythmias:Atrial Fibrillation; Risk                  Factors:Hypertension, Diabetes and Dyslipidemia.  Sonographer:    Raquel Sarna Senior RDCS Referring Phys: [place]  Sonographer Comments: VERY lateral windows, technically difficult due to significant kyphosis. IMPRESSIONS  1. Left ventricular ejection fraction, by estimation, is 45 to 50%. The left ventricle has mildly decreased function. The left ventricle demonstrates regional wall motion abnormalities (see scoring diagram/findings for description). Left ventricular diastolic parameters are indeterminate.  2. Right ventricular systolic function is normal. The right ventricular size is normal. There is moderately elevated pulmonary artery systolic pressure.  3. Left atrial size was severely dilated.  4. Right atrial size was severely dilated.  5. The mitral valve is normal in structure. Mild mitral valve regurgitation. No evidence of mitral stenosis.  6. Tricuspid valve regurgitation is mild to moderate.  7. The aortic valve is tricuspid. There is mild calcification of the aortic valve. There is mild thickening of the aortic valve. Aortic valve regurgitation is not visualized. Aortic valve sclerosis is present, with no evidence of aortic valve stenosis.  8. The inferior vena cava is normal in size with <50% respiratory variability, suggesting right atrial pressure of 8 mmHg. FINDINGS  Left Ventricle: Left ventricular ejection fraction, by estimation, is 45 to 50%. The left ventricle has mildly decreased function. The left ventricle demonstrates regional wall motion abnormalities. The left ventricular internal cavity size was normal in size. There is no left ventricular hypertrophy. Left ventricular diastolic parameters are indeterminate.  LV Wall Scoring: The entire anterior septum and entire inferior wall are hypokinetic. The entire anterior wall, entire lateral wall, mid inferoseptal segment, basal inferoseptal segment, and apex are normal. Right Ventricle: The right ventricular size is normal. No increase  in right ventricular wall thickness. Right ventricular systolic function is normal. There is moderately elevated pulmonary artery systolic pressure. The tricuspid regurgitant velocity is 3.06 m/s, and with an assumed right atrial pressure of 8 mmHg, the estimated right ventricular systolic pressure is 40.3 mmHg. Left Atrium: Left atrial size was severely dilated. Right Atrium: Right atrial size was severely dilated. Pericardium: There is no evidence of pericardial effusion.  Mitral Valve: The mitral valve is normal in structure. Mild mitral valve regurgitation. No evidence of mitral valve stenosis. Tricuspid Valve: The tricuspid valve is normal in structure. Tricuspid valve regurgitation is mild to moderate. No evidence of tricuspid stenosis. Aortic Valve: The aortic valve is tricuspid. There is mild calcification of the aortic valve. There is mild thickening of the aortic valve. Aortic valve regurgitation is not visualized. Aortic valve sclerosis is present, with no evidence of aortic valve stenosis. Pulmonic Valve: The pulmonic valve was normal in structure. Pulmonic valve regurgitation is trivial. No evidence of pulmonic stenosis. Aorta: The aortic root is normal in size and structure. Venous: The inferior vena cava is normal in size with less than 50% respiratory variability, suggesting right atrial pressure of 8 mmHg. IAS/Shunts: No atrial level shunt detected by color flow Doppler.  LEFT VENTRICLE PLAX 2D LVIDd:         4.20 cm LVIDs:         2.80 cm LV PW:         0.90 cm LV IVS:        0.80 cm LVOT diam:     2.00 cm     3D Volume EF: LV SV:         39          3D EF:        44 % LV SV Index:   20          LV EDV:       87 ml LVOT Area:     3.14 cm    LV ESV:       49 ml                            LV SV:        39 ml  LV Volumes (MOD) LV vol d, MOD A2C: 69.0 ml LV vol d, MOD A4C: 60.3 ml LV vol s, MOD A2C: 42.1 ml LV vol s, MOD A4C: 33.3 ml LV SV MOD A2C:     26.9 ml LV SV MOD A4C:     60.3 ml LV SV MOD BP:       28.6 ml RIGHT VENTRICLE RV S prime:     9.79 cm/s TAPSE (M-mode): 1.6 cm LEFT ATRIUM              Index        RIGHT ATRIUM           Index LA diam:        4.10 cm  2.10 cm/m   RA Area:     39.70 cm LA Vol (A2C):   108.0 ml 55.36 ml/m  RA Volume:   157.00 ml 80.47 ml/m LA Vol (A4C):   66.3 ml  33.98 ml/m LA Biplane Vol: 86.4 ml  44.28 ml/m  AORTIC VALVE LVOT Vmax:   67.00 cm/s LVOT Vmean:  45.000 cm/s LVOT VTI:    0.125 m  AORTA Ao Root diam: 3.40 cm Ao Asc diam:  3.40 cm TRICUSPID VALVE TR Peak grad:   37.5 mmHg TR Vmax:        306.00 cm/s  SHUNTS Systemic VTI:  0.12 m Systemic Diam: 2.00 cm Skeet Latch MD Electronically signed by Skeet Latch MD Signature Date/Time: 07/04/2021/2:34:12 PM    Final     Scheduled Meds:  digoxin  0.125 mg Oral Daily   docusate sodium  100 mg Oral  BID   fluticasone furoate-vilanterol  1 puff Inhalation QHS   insulin aspart  0-5 Units Subcutaneous QHS   insulin aspart  0-9 Units Subcutaneous TID WC   loratadine  10 mg Oral Daily   methocarbamol  750 mg Oral TID   metoprolol succinate  100 mg Oral BID   nystatin  1 application Topical BID   pantoprazole  40 mg Oral Daily   sodium chloride flush  3 mL Intravenous Q12H   warfarin  2.5 mg Oral ONCE-1600   Warfarin - Pharmacist Dosing Inpatient   Does not apply q1600   Continuous Infusions:  diltiazem (CARDIZEM) infusion Stopped (07/05/21 1055)     LOS: 4 days    Time spent: 64min  Domenic Polite, MD [facility]   07/06/2021, 1:44 PM

## 2021-07-06 NOTE — Progress Notes (Signed)
ANTICOAGULATION CONSULT NOTE - Follow Up Consult  Pharmacy Consult for Warfarin Indication: atrial fibrillation  Allergies  Allergen Reactions   Wheat Bran Shortness Of Breath   Kiwi Extract Other (See Comments)    Causes mouth sores   Orange Fruit [Citrus] Other (See Comments)    Causes mouth sores   Phenergan [Promethazine] Other (See Comments)    Disorientation    Pineapple Other (See Comments)    Causes mouth sores    Promethazine Hcl Other (See Comments)    Disorientation    Tomato Other (See Comments)    Causes mouth sores   Tylenol [Acetaminophen] Other (See Comments)    Causes fast heart rate   Vistaril [Hydroxyzine Hcl] Other (See Comments)    Confusion    Codeine Itching and Rash   Tape Rash and Other (See Comments)    Blisters on skin    Patient Measurements: Height: 5\' 6"  (167.6 cm) Weight: 79.1 kg (174 lb 6.1 oz) IBW/kg (Calculated) : 59.3  Vital Signs: Temp: 98.5 F (36.9 C) (01/08 0738) Temp Source: Axillary (01/08 0738) BP: 109/55 (01/08 0738) Pulse Rate: 83 (01/08 0800)  Labs: Recent Labs    07/04/21 0209 07/05/21 0238 07/06/21 0321  HGB 9.3* 9.6* 9.6*  HCT 30.3* 31.3* 30.2*  PLT 352 351 380  LABPROT 28.6* 28.3* 25.2*  INR 2.7* 2.7* 2.3*  CREATININE 0.46 0.43* 0.47    Estimated Creatinine Clearance: 50.6 mL/min (by C-G formula based on SCr of 0.47 mg/dL).   Medications:  Scheduled:   digoxin  0.125 mg Oral Daily   docusate sodium  100 mg Oral BID   fluticasone furoate-vilanterol  1 puff Inhalation QHS   insulin aspart  0-5 Units Subcutaneous QHS   insulin aspart  0-9 Units Subcutaneous TID WC   loratadine  10 mg Oral Daily   methocarbamol  750 mg Oral TID   metoprolol succinate  100 mg Oral BID   nystatin  1 application Topical BID   pantoprazole  40 mg Oral Daily   sodium chloride flush  3 mL Intravenous Q12H   Warfarin - Pharmacist Dosing Inpatient   Does not apply q1600    Assessment: 88 yo F admitted with weakness.  Past medical history significant for Afib with RVR and chronically anticoagulated with warfarin. Last PTA warfarin dose 07/01/2021. Pharmacy consulted for warfarin dosing.   INR today is therapeutic at 2.3. CBC stable. No signs/symptoms of bleeding noted per RN.    PTA warfarin regimen: 2.5 mg Sun-Tues-Wed-Thurs-Sat, None Mon and Fri  Goal of Therapy:  INR 2-3 Monitor platelets by anticoagulation protocol: Yes   Plan:  Warfarin 2.5 mg PO x 1. Daily INR. Monitor CBC and for signs/symptoms of bleeding.    Vance Peper, PharmD PGY1 Pharmacy Resident Phone [phone] 07/06/2021 9:04 AM   Please check AMION for all [place] phone numbers After 10:00 PM, call [place] [phone]

## 2021-07-07 LAB — PROTIME-INR
INR: 2.7 — ABNORMAL HIGH (ref 0.8–1.2)
Prothrombin Time: 28.4 seconds — ABNORMAL HIGH (ref 11.4–15.2)

## 2021-07-07 LAB — GLUCOSE, CAPILLARY
Glucose-Capillary: 125 mg/dL — ABNORMAL HIGH (ref 70–99)
Glucose-Capillary: 133 mg/dL — ABNORMAL HIGH (ref 70–99)
Glucose-Capillary: 175 mg/dL — ABNORMAL HIGH (ref 70–99)
Glucose-Capillary: 110 mg/dL — ABNORMAL HIGH (ref 70–99)

## 2021-07-07 LAB — URINE CULTURE

## 2021-07-07 MED ORDER — WARFARIN SODIUM 2.5 MG PO TABS: 2.5000 mg | ORAL_TABLET | ORAL | Status: DC

## 2021-07-07 NOTE — Care Management Important Message (Signed)
Important Message  Patient Details  Name: Mahagony Grieb MRN: [id number] Date of Birth: 1930-04-14   Medicare Important Message Given:  Yes     Shelda Altes 07/07/2021, 10:39 AM

## 2021-07-07 NOTE — Progress Notes (Signed)
Physical Therapy Treatment Patient Details Name: Casey Patel MRN: [id number] DOB: 1929/11/01 Today's Date: 07/07/2021   History of Present Illness Pt is an 88 y/o female admitted secondary to tachycardia. Pt with recent admission for UTI from 12/27-30. PMH includes CHF, a fib, HTN, DM.    PT Comments    Pt agreeable to getting up with therapy, reports that a couple of weeks ago that she was walking around with RW. Given level of deconditioning and increased wounds, possibly longer than that. Pt able to initiate movement to EoB, but ultimately requires total A for coming to EoB. Initially requires support to maintain seated balance, progresses to min guard and is able to perform limited exercises before fatiguing and requiring total A to return to bed. D/c plans remain appropriate and it appears that pt will be discharged to [facility] tomorrow. If discharge delayed pt will benefit from air mattress given wounds.   Recommendations for follow up therapy are one component of a multi-disciplinary discharge planning process, led by the attending physician.  Recommendations may be updated based on patient status, additional functional criteria and insurance authorization.  Follow Up Recommendations  Skilled nursing-short term rehab (<3 hours/day)     Assistance Recommended at Discharge Frequent or constant Supervision/Assistance  Patient can return home with the following Two people to help with walking and/or transfers;Two people to help with bathing/dressing/bathroom;Assistance with cooking/housework;Direct supervision/assist for financial management;Direct supervision/assist for medications management;Help with stairs or ramp for entrance;Assist for transportation   Equipment Recommendations  None recommended by PT    Recommendations for Other Services       Precautions / Restrictions Precautions Precautions: Fall Restrictions Weight Bearing Restrictions: No     Mobility  Bed  Mobility Overal bed mobility: Needs Assistance Bed Mobility: Supine to Sit;Sit to Supine     Supine to sit: Total assist Sit to supine: Total assist   General bed mobility comments: pt able to initiate LE management across bed but tires easily and ultimately takes total A for coming to EoB and return to bed    Transfers                   General transfer comment: unable to attempt due to fatigue with sitting EoB    Ambulation/Gait               General Gait Details: unable         Balance Overall balance assessment: History of Falls Sitting-balance support: Feet supported;Bilateral upper extremity supported Sitting balance-Leahy Scale: Fair (initially requires total A for maintaining balance)                                      Cognition Arousal/Alertness: Awake/alert Behavior During Therapy: WFL for tasks assessed/performed Overall Cognitive Status: No family/caregiver present to determine baseline cognitive functioning                                          Exercises General Exercises - Lower Extremity Long Arc Quad: AROM;Both;5 reps;Seated Hip Flexion/Marching: AROM;Both;5 reps;Seated    General Comments General comments (skin integrity, edema, etc.): VSS on RA, noted to have bandaging on bilateral forearms, L heel, sacrum, also has DTI mid back      Pertinent Vitals/Pain Pain Assessment: Faces Faces Pain Scale: Hurts even more  Pain Location: back Pain Descriptors / Indicators: Sore Pain Intervention(s): Limited activity within patient's tolerance;Monitored during session;Repositioned     PT Goals (current goals can now be found in the care plan section) Acute Rehab PT Goals Patient Stated Goal: to go home PT Goal Formulation: With patient Time For Goal Achievement: 07/17/21 Potential to Achieve Goals: Fair Progress towards PT goals: Progressing toward goals    Frequency    Min 2X/week      PT  Plan Current plan remains appropriate       AM-PAC PT "6 Clicks" Mobility   Outcome Measure  Help needed turning from your back to your side while in a flat bed without using bedrails?: Total Help needed moving from lying on your back to sitting on the side of a flat bed without using bedrails?: Total Help needed moving to and from a bed to a chair (including a wheelchair)?: Total Help needed standing up from a chair using your arms (e.g., wheelchair or bedside chair)?: Total Help needed to walk in hospital room?: Total Help needed climbing 3-5 steps with a railing? : Total 6 Click Score: 6    End of Session Equipment Utilized During Treatment: Gait belt;Oxygen Activity Tolerance: Patient limited by pain;Patient limited by fatigue Patient left: in bed;with call bell/phone within reach (on stretcher in ED) Nurse Communication: Mobility status PT Visit Diagnosis: Difficulty in walking, not elsewhere classified (R26.2);Pain;History of falling (Z91.81);Muscle weakness (generalized) (M62.81)     Time: 7371-0626 PT Time Calculation (min) (ACUTE ONLY): 20 min  Charges:  $Therapeutic Activity: 8-22 mins                     Add Dinapoli B. Migdalia Dk PT, DPT Acute Rehabilitation Services Pager [phone] Office [phone]    [place] 07/07/2021, 4:30 PM

## 2021-07-07 NOTE — Progress Notes (Signed)
PROGRESS NOTE    Makyia Erxleben  DGU:[id number] DOB: 10-Mar-1930 DOA: 07/02/2021 PCP: [name], MD  Brief Narrative: 89/F with general debility, hypertension, diabetes mellitus, history of atrial fibrillation recently hospitalized with UTI, sepsis, on 12/30, family declined SNF options and went home instead, came back to the ER on 1/2 with A. fib RVR, cardiology recommended changing her Toprol 1 from 150 mg daily to 100 mg twice daily. Subsequently patient has been weaker than usual and less able to participate in ADLs, heart rate has been persistently elevated and was brought back to the ER  Assessment & Plan:   Atrial fibrillation with RVR ([place])- (present on admission) -Long history of A. fib presenting with persistent A. fib RVR  -Recently Toprol XL dose was increased to 100 mg twice daily  -Toprol resumed, 100 mg twice daily -Weaned off Cardizem drip yesterday -Started on digoxin, heart rate improving -2D echo with EF of 45-50%, no regional wall motion abnormalities, moderate PAH, given advanced age, debility she is not a candidate for further work-up, remains euvolemic -Coumadin continued, -Palliative care consulted for goals of care, high risk of quick readmission -PT OT eval completed, SNF recommended, discharge planning  Leukocytosis -Urinalysis unremarkable, chest x-ray is unchanged, remains afebrile and nontoxic, repeat CBC in a.m.   HTN -Toprol resumed, blood pressures soft, monitor   DM -Last A1c was 6.0 -Glucophage on hold, continue sensitive sliding scale  Cognitive deficits, intermittent confusion -Stable, now   Goals of care, counseling/discussion -Discussed with sister, she is DNR, at risk for frequent rehospitalizations and ongoing decline -Palliative consulted for goals of care discussions   General debility, failure to thrive   DVT prophylaxis: Coumadin Code Status: DNR Family Communication: Updated Sister 1/7, 1/9 Disposition Plan:  Status is:  Inpatient  Remains inpatient appropriate because: Severity of illness  Consultants:    Procedures:   Antimicrobials:    Subjective: -Feels better today, no events overnight, mild confusion yesterday evening  Objective: Vitals:   07/06/21 2202 07/07/21 0456 07/07/21 0515 07/07/21 0947  BP: (!) 97/55 (!) 102/56  102/65  Pulse: 90 83 89 (!) 107  Resp:  14    Temp:  97.9 F (36.6 C)    TempSrc:  Axillary    SpO2:  97% 100%   Weight:   80.5 kg   Height:        Intake/Output Summary (Last 24 hours) at 07/07/2021 1248 Last data filed at 07/07/2021 0458 Gross per 24 hour  Intake --  Output 300 ml  Net -300 ml   Filed Weights   07/04/21 0438 07/06/21 0412 07/07/21 0515  Weight: 85.6 kg 79.1 kg 80.5 kg    Examination:  General exam: Elderly obese chronically ill female sitting up in bed, AAO x2, no distress  Mild cognitive deficits noted CVS: S1-S2, irregularly irregular rhythm Lungs: Improving air movement, decreased breath sounds bases Abdomen: Soft, nontender, bowel sounds present Extremities: No edema  Psychiatry: Flat affect  Data Reviewed:   CBC: Recent Labs  Lab 07/02/21 1155 07/03/21 0241 07/04/21 0209 07/05/21 0238 07/06/21 0321  WBC 10.3 9.0 10.3 10.4 15.0*  NEUTROABS 9.0*  --   --   --   --   HGB 9.4* 9.5* 9.3* 9.6* 9.6*  HCT 31.7* 32.3* 30.3* 31.3* 30.2*  MCV 91.1 90.5 88.3 87.9 86.5  PLT 329 331 352 351 956   Basic Metabolic Panel: Recent Labs  Lab 06/30/21 1755 07/02/21 1155 07/03/21 0241 07/04/21 0209 07/05/21 0238 07/06/21 0321  NA  136 139 137 134* 134* 136  K 4.9 4.9 4.3 4.2 3.9 4.3  CL 100 102 99 98 95* 98  CO2 30 29 32 28 32 34*  GLUCOSE 149* 158* 130* 150* 138* 136*  BUN 29* 32* 31* 30* 24* 21  CREATININE 0.50 0.63 0.61 0.46 0.43* 0.47  CALCIUM 8.3* 8.2* 8.3* 8.0* 8.3* 7.9*  MG 1.8  --   --   --   --   --    GFR: Estimated Creatinine Clearance: 51 mL/min (by C-G formula based on SCr of 0.47 mg/dL). Liver Function  Tests: Recent Labs  Lab 07/02/21 1155  AST 28  ALT 19  ALKPHOS 68  BILITOT 0.7  PROT 5.8*  ALBUMIN 2.3*   Recent Labs  Lab 07/02/21 1155  LIPASE 20   No results for input(s): AMMONIA in the last 168 hours. Coagulation Profile: Recent Labs  Lab 07/03/21 1559 07/04/21 0209 07/05/21 0238 07/06/21 0321 07/07/21 0321  INR 2.2* 2.7* 2.7* 2.3* 2.7*   Cardiac Enzymes: No results for input(s): CKTOTAL, CKMB, CKMBINDEX, TROPONINI in the last 168 hours. BNP (last 3 results) No results for input(s): PROBNP in the last 8760 hours. HbA1C: No results for input(s): HGBA1C in the last 72 hours. CBG: Recent Labs  Lab 07/06/21 1137 07/06/21 1633 07/06/21 2107 07/07/21 0754 07/07/21 1148  GLUCAP 195* 143* 128* 125* 133*   Lipid Profile: No results for input(s): CHOL, HDL, LDLCALC, TRIG, CHOLHDL, LDLDIRECT in the last 72 hours. Thyroid Function Tests: No results for input(s): TSH, T4TOTAL, FREET4, T3FREE, THYROIDAB in the last 72 hours.  Anemia Panel: No results for input(s): VITAMINB12, FOLATE, FERRITIN, TIBC, IRON, RETICCTPCT in the last 72 hours. Urine analysis:    Component Value Date/Time   COLORURINE YELLOW 07/06/2021 [place] 07/06/2021 1152   LABSPEC 1.020 07/06/2021 1152   PHURINE 6.5 07/06/2021 1152   GLUCOSEU NEGATIVE 07/06/2021 1152   HGBUR NEGATIVE 07/06/2021 1152   [place] 07/06/2021 1152   KETONESUR NEGATIVE 07/06/2021 1152   PROTEINUR NEGATIVE 07/06/2021 1152   NITRITE NEGATIVE 07/06/2021 1152   LEUKOCYTESUR TRACE (A) 07/06/2021 1152   Sepsis Labs: @LABRCNTIP (procalcitonin:4,lacticidven:4)  ) Recent Results (from the past 240 hour(s))  Urine Culture     Status: None   Collection Time: 06/30/21  1:30 PM   Specimen: In/Out Cath Urine  Result Value Ref Range Status   Specimen Description   Final    IN/OUT CATH URINE Performed at [facility], [address] [place], [place] [address]    Special Requests   Final     NONE Performed at [facility], [address] [place], [place] [address]    Culture   Final    NO GROWTH Performed at [facility], [place] [address], [place], [place] [address]    Report Status 07/02/2021 FINAL  Final  Resp Panel by RT-PCR (Flu A&B, Covid) Nasopharyngeal Swab     Status: None   Collection Time: 07/02/21 11:35 AM   Specimen: Nasopharyngeal Swab; Nasopharyngeal(NP) swabs in vial transport medium  Result Value Ref Range Status   SARS Coronavirus 2 by RT PCR NEGATIVE NEGATIVE Final    Comment: (NOTE) SARS-CoV-2 target nucleic acids are NOT DETECTED.  The SARS-CoV-2 RNA is generally detectable in upper respiratory specimens during the acute phase of infection. The lowest concentration of SARS-CoV-2 viral copies this assay can detect is 138 copies/mL. A negative result does not preclude SARS-Cov-2 infection and should not be used as the sole basis  for treatment or other patient management decisions. A negative result may occur with  improper specimen collection/handling, submission of specimen other than nasopharyngeal swab, presence of viral mutation(s) within the areas targeted by this assay, and inadequate number of viral copies(<138 copies/mL). A negative result must be combined with clinical observations, patient history, and epidemiological information. The expected result is Negative.  Fact Sheet for Patients:  EntrepreneurPulse.com.au  Fact Sheet for Healthcare Providers:  IncredibleEmployment.be  This test is no t yet approved or cleared by the [place] FDA and  has been authorized for detection and/or diagnosis of SARS-CoV-2 by FDA under an Emergency Use Authorization (EUA). This EUA will remain  in effect (meaning this test can be used) for the duration of the COVID-19 declaration under Section 564(b)(1) of the Act, 21 U.S.C.section 360bbb-3(b)(1), unless the authorization is terminated  or revoked sooner.        Influenza A by PCR NEGATIVE NEGATIVE Final   Influenza B by PCR NEGATIVE NEGATIVE Final    Comment: (NOTE) The Xpert Xpress SARS-CoV-2/FLU/RSV plus assay is intended as an aid in the diagnosis of influenza from Nasopharyngeal swab specimens and should not be used as a sole basis for treatment. Nasal washings and aspirates are unacceptable for Xpert Xpress SARS-CoV-2/FLU/RSV testing.  Fact Sheet for Patients: EntrepreneurPulse.com.au  Fact Sheet for Healthcare Providers: IncredibleEmployment.be  This test is not yet approved or cleared by the [place] FDA and has been authorized for detection and/or diagnosis of SARS-CoV-2 by FDA under an Emergency Use Authorization (EUA). This EUA will remain in effect (meaning this test can be used) for the duration of the COVID-19 declaration under Section 564(b)(1) of the Act, 21 U.S.C. section 360bbb-3(b)(1), unless the authorization is terminated or revoked.  Performed at [facility], [place] [address], [place], [place] [address]   Urine Culture     Status: Abnormal   Collection Time: 07/06/21 11:52 AM   Specimen: Urine, Clean Catch  Result Value Ref Range Status   Specimen Description URINE, CLEAN CATCH  Final   Special Requests   Final    NONE Performed at [facility], [place] [address] [place], [place] [address]    Culture MULTIPLE SPECIES PRESENT, SUGGEST RECOLLECTION (A)  Final   Report Status 07/07/2021 FINAL  Final     Radiology Studies: DG CHEST PORT 1 VIEW  Result Date: 07/06/2021 CLINICAL DATA:  Chief complaint-atrial fibrillation and weakness Reason for exam: leucocytosis EXAM: PORTABLE CHEST 1 VIEW COMPARISON:  07/02/2021. FINDINGS: Cardiac silhouette mildly enlarged. Patchy right mid lung airspace opacity in bilateral lung base opacities. No significant change from the prior exam allowing for differences in patient positioning and technique. No gross pneumothorax.  IMPRESSION: 1. Limited study. Persistent lung base opacities consistent with a combination of pleural effusions and atelectasis/consolidation. Additional small area of atelectasis or consolidation in the right mid lung. No convincing pulmonary edema. No significant change from the most recent prior study. Electronically Signed   By: Lajean Manes M.D.   On: 07/06/2021 08:35    Scheduled Meds:  digoxin  0.125 mg Oral Daily   docusate sodium  100 mg Oral BID   fluticasone furoate-vilanterol  1 puff Inhalation QHS   insulin aspart  0-5 Units Subcutaneous QHS   insulin aspart  0-9 Units Subcutaneous TID WC   loratadine  10 mg Oral Daily   methocarbamol  750 mg Oral TID   metoprolol succinate  100 mg Oral BID   nystatin  1  application Topical BID   pantoprazole  40 mg Oral Daily   sodium chloride flush  3 mL Intravenous Q12H   Warfarin - Pharmacist Dosing Inpatient   Does not apply q1600   Continuous Infusions:  diltiazem (CARDIZEM) infusion Stopped (07/05/21 1055)     LOS: 5 days    Time spent: 25 min  Domenic Polite, MD [facility]   07/07/2021, 12:48 PM

## 2021-07-07 NOTE — TOC Progression Note (Addendum)
Transition of Care [facility]) - Progression Note    Patient Details  Name: Casey Patel MRN: [id number] Date of Birth: 02-28-1930  Transition of Care [facility]) CM/SW [place], [place] Phone Number: 07/07/2021, 12:40 PM  Clinical Narrative:     CSW provided patients sister with SNF bed offers. Patients sister chose SNF placement at [facility] for patient. CSW received confirmation from [place] with [facility] that she can accept patient tomorrow if medically ready for dc.Palliative services will follow patient at snf. CSW will continue to follow and assist with patients dc planning needs.  Expected Discharge Plan: [place] Barriers to Discharge: Continued Medical Work up  Expected Discharge Plan and Services Expected Discharge Plan: [place] In-house Referral: Clinical Social Work     Living arrangements for the past 2 months: Single Family Home                                       Social Determinants of Health (SDOH) Interventions    Readmission Risk Interventions Readmission Risk Prevention Plan 07/04/2021 06/26/2021  Transportation Screening Complete Complete  PCP or Specialist Appt within 5-7 Days - Complete  Home Care Screening - Complete  Medication Review (RN CM) - Complete  HRI or Home Care Consult Complete -  Social Work Consult for Recovery Care Planning/Counseling Complete -  Palliative Care Screening Complete -  Some recent data might be hidden

## 2021-07-07 NOTE — Progress Notes (Signed)
ANTICOAGULATION CONSULT NOTE - Follow Up Consult  Pharmacy Consult for Warfarin Indication: atrial fibrillation  Allergies  Allergen Reactions   Wheat Bran Shortness Of Breath   Kiwi Extract Other (See Comments)    Causes mouth sores   Orange Fruit [Citrus] Other (See Comments)    Causes mouth sores   Phenergan [Promethazine] Other (See Comments)    Disorientation    Pineapple Other (See Comments)    Causes mouth sores    Promethazine Hcl Other (See Comments)    Disorientation    Tomato Other (See Comments)    Causes mouth sores   Tylenol [Acetaminophen] Other (See Comments)    Causes fast heart rate   Vistaril [Hydroxyzine Hcl] Other (See Comments)    Confusion    Codeine Itching and Rash   Tape Rash and Other (See Comments)    Blisters on skin    Patient Measurements: Height: 5\' 6"  (167.6 cm) Weight: 80.5 kg (177 lb 7.5 oz) IBW/kg (Calculated) : 59.3  Vital Signs: Temp: 97.9 F (36.6 C) (01/09 0456) Temp Source: Axillary (01/09 0456) BP: 102/65 (01/09 0947) Pulse Rate: 107 (01/09 0947)  Labs: Recent Labs    07/05/21 0238 07/06/21 0321 07/07/21 0321  HGB 9.6* 9.6*  --   HCT 31.3* 30.2*  --   PLT 351 380  --   LABPROT 28.3* 25.2* 28.4*  INR 2.7* 2.3* 2.7*  CREATININE 0.43* 0.47  --      Estimated Creatinine Clearance: 51 mL/min (by C-G formula based on SCr of 0.47 mg/dL).   Medications:  Scheduled:   digoxin  0.125 mg Oral Daily   docusate sodium  100 mg Oral BID   fluticasone furoate-vilanterol  1 puff Inhalation QHS   insulin aspart  0-5 Units Subcutaneous QHS   insulin aspart  0-9 Units Subcutaneous TID WC   loratadine  10 mg Oral Daily   methocarbamol  750 mg Oral TID   metoprolol succinate  100 mg Oral BID   nystatin  1 application Topical BID   pantoprazole  40 mg Oral Daily   sodium chloride flush  3 mL Intravenous Q12H   Warfarin - Pharmacist Dosing Inpatient   Does not apply q1600    Assessment: 88 yo F admitted with weakness.  Past medical history significant for Afib with RVR and chronically anticoagulated with warfarin. Last PTA warfarin dose 07/01/2021. Pharmacy consulted for warfarin dosing. Palliative care to see.  -INR today is therapeutic at 2.7.    PTA warfarin regimen: 2.5 mg Sun-Tues-Wed-Thurs-Sat, None Mon and Fri  Goal of Therapy:  INR 2-3 Monitor platelets by anticoagulation protocol: Yes   Plan:  -Continue warfarin at her home dose -INR MWF  Hildred Laser, PharmD Clinical Pharmacist **Pharmacist phone directory can now be found on [email or web address] ([email or web address]).  Listed under [place].

## 2021-07-08 DIAGNOSIS — R41841 Cognitive communication deficit: Secondary | ICD-10-CM | POA: Diagnosis not present

## 2021-07-08 DIAGNOSIS — R2681 Unsteadiness on feet: Secondary | ICD-10-CM | POA: Diagnosis not present

## 2021-07-08 DIAGNOSIS — M6281 Muscle weakness (generalized): Secondary | ICD-10-CM | POA: Diagnosis not present

## 2021-07-08 DIAGNOSIS — I4891 Unspecified atrial fibrillation: Secondary | ICD-10-CM | POA: Diagnosis not present

## 2021-07-08 DIAGNOSIS — I119 Hypertensive heart disease without heart failure: Secondary | ICD-10-CM | POA: Diagnosis present

## 2021-07-08 DIAGNOSIS — I959 Hypotension, unspecified: Secondary | ICD-10-CM | POA: Diagnosis not present

## 2021-07-08 DIAGNOSIS — E785 Hyperlipidemia, unspecified: Secondary | ICD-10-CM | POA: Diagnosis not present

## 2021-07-08 DIAGNOSIS — Z7401 Bed confinement status: Secondary | ICD-10-CM | POA: Diagnosis not present

## 2021-07-08 DIAGNOSIS — J90 Pleural effusion, not elsewhere classified: Secondary | ICD-10-CM | POA: Diagnosis not present

## 2021-07-08 DIAGNOSIS — E669 Obesity, unspecified: Secondary | ICD-10-CM | POA: Diagnosis present

## 2021-07-08 DIAGNOSIS — L89892 Pressure ulcer of other site, stage 2: Secondary | ICD-10-CM | POA: Diagnosis not present

## 2021-07-08 DIAGNOSIS — I517 Cardiomegaly: Secondary | ICD-10-CM | POA: Diagnosis not present

## 2021-07-08 DIAGNOSIS — J45909 Unspecified asthma, uncomplicated: Secondary | ICD-10-CM | POA: Diagnosis not present

## 2021-07-08 DIAGNOSIS — I5020 Unspecified systolic (congestive) heart failure: Secondary | ICD-10-CM | POA: Diagnosis not present

## 2021-07-08 DIAGNOSIS — G9341 Metabolic encephalopathy: Secondary | ICD-10-CM | POA: Diagnosis present

## 2021-07-08 DIAGNOSIS — L89893 Pressure ulcer of other site, stage 3: Secondary | ICD-10-CM | POA: Diagnosis not present

## 2021-07-08 DIAGNOSIS — Y95 Nosocomial condition: Secondary | ICD-10-CM | POA: Diagnosis present

## 2021-07-08 DIAGNOSIS — M1990 Unspecified osteoarthritis, unspecified site: Secondary | ICD-10-CM | POA: Diagnosis present

## 2021-07-08 DIAGNOSIS — R4182 Altered mental status, unspecified: Secondary | ICD-10-CM | POA: Diagnosis not present

## 2021-07-08 DIAGNOSIS — E119 Type 2 diabetes mellitus without complications: Secondary | ICD-10-CM | POA: Diagnosis not present

## 2021-07-08 DIAGNOSIS — Z7901 Long term (current) use of anticoagulants: Secondary | ICD-10-CM | POA: Diagnosis not present

## 2021-07-08 DIAGNOSIS — L89890 Pressure ulcer of other site, unstageable: Secondary | ICD-10-CM | POA: Diagnosis not present

## 2021-07-08 DIAGNOSIS — J9811 Atelectasis: Secondary | ICD-10-CM | POA: Diagnosis not present

## 2021-07-08 DIAGNOSIS — M40204 Unspecified kyphosis, thoracic region: Secondary | ICD-10-CM | POA: Diagnosis present

## 2021-07-08 DIAGNOSIS — L8990 Pressure ulcer of unspecified site, unspecified stage: Secondary | ICD-10-CM | POA: Diagnosis not present

## 2021-07-08 DIAGNOSIS — Z7189 Other specified counseling: Secondary | ICD-10-CM | POA: Diagnosis not present

## 2021-07-08 DIAGNOSIS — M6259 Muscle wasting and atrophy, not elsewhere classified, multiple sites: Secondary | ICD-10-CM | POA: Diagnosis not present

## 2021-07-08 DIAGNOSIS — Z886 Allergy status to analgesic agent status: Secondary | ICD-10-CM | POA: Diagnosis not present

## 2021-07-08 DIAGNOSIS — R001 Bradycardia, unspecified: Secondary | ICD-10-CM | POA: Diagnosis not present

## 2021-07-08 DIAGNOSIS — M533 Sacrococcygeal disorders, not elsewhere classified: Secondary | ICD-10-CM | POA: Diagnosis not present

## 2021-07-08 DIAGNOSIS — L89159 Pressure ulcer of sacral region, unspecified stage: Secondary | ICD-10-CM | POA: Diagnosis not present

## 2021-07-08 DIAGNOSIS — Z515 Encounter for palliative care: Secondary | ICD-10-CM | POA: Diagnosis not present

## 2021-07-08 DIAGNOSIS — R252 Cramp and spasm: Secondary | ICD-10-CM | POA: Diagnosis not present

## 2021-07-08 DIAGNOSIS — J918 Pleural effusion in other conditions classified elsewhere: Secondary | ICD-10-CM | POA: Diagnosis present

## 2021-07-08 DIAGNOSIS — B964 Proteus (mirabilis) (morganii) as the cause of diseases classified elsewhere: Secondary | ICD-10-CM | POA: Diagnosis present

## 2021-07-08 DIAGNOSIS — Z66 Do not resuscitate: Secondary | ICD-10-CM | POA: Diagnosis present

## 2021-07-08 DIAGNOSIS — J9601 Acute respiratory failure with hypoxia: Secondary | ICD-10-CM | POA: Diagnosis not present

## 2021-07-08 DIAGNOSIS — Z20822 Contact with and (suspected) exposure to covid-19: Secondary | ICD-10-CM | POA: Diagnosis present

## 2021-07-08 DIAGNOSIS — J189 Pneumonia, unspecified organism: Secondary | ICD-10-CM | POA: Diagnosis not present

## 2021-07-08 DIAGNOSIS — J9621 Acute and chronic respiratory failure with hypoxia: Secondary | ICD-10-CM | POA: Diagnosis not present

## 2021-07-08 DIAGNOSIS — I10 Essential (primary) hypertension: Secondary | ICD-10-CM | POA: Diagnosis not present

## 2021-07-08 DIAGNOSIS — R262 Difficulty in walking, not elsewhere classified: Secondary | ICD-10-CM | POA: Diagnosis not present

## 2021-07-08 DIAGNOSIS — J9622 Acute and chronic respiratory failure with hypercapnia: Secondary | ICD-10-CM | POA: Diagnosis not present

## 2021-07-08 DIAGNOSIS — K219 Gastro-esophageal reflux disease without esophagitis: Secondary | ICD-10-CM | POA: Diagnosis not present

## 2021-07-08 DIAGNOSIS — L89323 Pressure ulcer of left buttock, stage 3: Secondary | ICD-10-CM | POA: Diagnosis not present

## 2021-07-08 DIAGNOSIS — I509 Heart failure, unspecified: Secondary | ICD-10-CM | POA: Diagnosis not present

## 2021-07-08 DIAGNOSIS — R0989 Other specified symptoms and signs involving the circulatory and respiratory systems: Secondary | ICD-10-CM | POA: Diagnosis not present

## 2021-07-08 DIAGNOSIS — R404 Transient alteration of awareness: Secondary | ICD-10-CM | POA: Diagnosis not present

## 2021-07-08 DIAGNOSIS — R9431 Abnormal electrocardiogram [ECG] [EKG]: Secondary | ICD-10-CM | POA: Diagnosis present

## 2021-07-08 DIAGNOSIS — R4189 Other symptoms and signs involving cognitive functions and awareness: Secondary | ICD-10-CM | POA: Diagnosis not present

## 2021-07-08 DIAGNOSIS — R1110 Vomiting, unspecified: Secondary | ICD-10-CM | POA: Diagnosis present

## 2021-07-08 DIAGNOSIS — J9612 Chronic respiratory failure with hypercapnia: Secondary | ICD-10-CM | POA: Diagnosis not present

## 2021-07-08 DIAGNOSIS — R627 Adult failure to thrive: Secondary | ICD-10-CM | POA: Diagnosis not present

## 2021-07-08 DIAGNOSIS — N390 Urinary tract infection, site not specified: Secondary | ICD-10-CM | POA: Diagnosis not present

## 2021-07-08 DIAGNOSIS — R531 Weakness: Secondary | ICD-10-CM | POA: Diagnosis not present

## 2021-07-08 DIAGNOSIS — Z743 Need for continuous supervision: Secondary | ICD-10-CM | POA: Diagnosis not present

## 2021-07-08 DIAGNOSIS — R791 Abnormal coagulation profile: Secondary | ICD-10-CM | POA: Diagnosis present

## 2021-07-08 DIAGNOSIS — L89154 Pressure ulcer of sacral region, stage 4: Secondary | ICD-10-CM | POA: Diagnosis not present

## 2021-07-08 DIAGNOSIS — I4821 Permanent atrial fibrillation: Secondary | ICD-10-CM | POA: Diagnosis not present

## 2021-07-08 LAB — CBC
HCT: 31.1 % — ABNORMAL LOW (ref 36.0–46.0)
Hemoglobin: 9.6 g/dL — ABNORMAL LOW (ref 12.0–15.0)
MCH: 27.1 pg (ref 26.0–34.0)
MCHC: 30.9 g/dL (ref 30.0–36.0)
MCV: 87.9 fL (ref 80.0–100.0)
Platelets: 429 10*3/uL — ABNORMAL HIGH (ref 150–400)
RBC: 3.54 MIL/uL — ABNORMAL LOW (ref 3.87–5.11)
RDW: 18 % — ABNORMAL HIGH (ref 11.5–15.5)
WBC: 10.5 10*3/uL (ref 4.0–10.5)
nRBC: 0 % (ref 0.0–0.2)

## 2021-07-08 LAB — BASIC METABOLIC PANEL
Anion gap: 7 (ref 5–15)
BUN: 11 mg/dL (ref 8–23)
CO2: 31 mmol/L (ref 22–32)
Calcium: 7.8 mg/dL — ABNORMAL LOW (ref 8.9–10.3)
Chloride: 97 mmol/L — ABNORMAL LOW (ref 98–111)
Creatinine, Ser: 0.39 mg/dL — ABNORMAL LOW (ref 0.44–1.00)
GFR, Estimated: >60 mL/min (ref >60)
Glucose, Bld: 110 mg/dL — ABNORMAL HIGH (ref 70–99)
Potassium: 4.7 mmol/L (ref 3.5–5.1)
Sodium: 135 mmol/L (ref 135–145)

## 2021-07-08 LAB — GLUCOSE, CAPILLARY
Glucose-Capillary: 120 mg/dL — ABNORMAL HIGH (ref 70–99)
Glucose-Capillary: 160 mg/dL — ABNORMAL HIGH (ref 70–99)

## 2021-07-08 LAB — SARS CORONAVIRUS 2 (TAT 6-24 HRS): SARS Coronavirus 2: NEGATIVE

## 2021-07-08 LAB — PROTIME-INR
INR: 2.4 — ABNORMAL HIGH (ref 0.8–1.2)
Prothrombin Time: 25.7 seconds — ABNORMAL HIGH (ref 11.4–15.2)

## 2021-07-08 MED ORDER — METOPROLOL SUCCINATE ER 100 MG PO TB24: 100.0000 mg | ORAL_TABLET | Freq: Two times a day (BID) | ORAL | Status: AC

## 2021-07-08 MED ORDER — TRAMADOL HCL 50 MG PO TABS: 50.0000 mg | ORAL_TABLET | Freq: Four times a day (QID) | ORAL | 0 refills | Status: DC | PRN

## 2021-07-08 MED ORDER — DIGOXIN 125 MCG PO TABS: 0.1250 mg | ORAL_TABLET | Freq: Every day | ORAL | 0 refills | Status: AC

## 2021-07-08 NOTE — Progress Notes (Signed)
Attempted to call report to [facility] SNF x 2 no answer, discharge packet will be sent with patient   Mayson Sterbenz, Tivis Ringer, RN

## 2021-07-08 NOTE — Plan of Care (Signed)
°  Problem: Education: Goal: Knowledge of disease or condition will improve Outcome: Adequate for Discharge Goal: Understanding of medication regimen will improve Outcome: Adequate for Discharge Goal: Individualized Educational Video(s) Outcome: Adequate for Discharge   Problem: Activity: Goal: Ability to tolerate increased activity will improve Outcome: Adequate for Discharge   Problem: Cardiac: Goal: Ability to achieve and maintain adequate cardiopulmonary perfusion will improve Outcome: Adequate for Discharge   Problem: Health Behavior/Discharge Planning: Goal: Ability to safely manage health-related needs after discharge will improve Outcome: Adequate for Discharge   Problem: Acute Rehab PT Goals(only PT should resolve) Goal: Pt Will Go Supine/Side To Sit Outcome: Adequate for Discharge Goal: Pt Will Go Sit To Supine/Side Outcome: Adequate for Discharge Goal: Patient Will Transfer Sit To/From Stand Outcome: Adequate for Discharge Goal: Pt Will Transfer Bed To Chair/Chair To Bed Outcome: Adequate for Discharge   Problem: Acute Rehab OT Goals (only OT should resolve) Goal: Pt. Will Perform Grooming Outcome: Adequate for Discharge Goal: Pt. Will Perform Upper Body Bathing Outcome: Adequate for Discharge Goal: Pt. Will Perform Upper Body Dressing Outcome: Adequate for Discharge Goal: Pt. Will Transfer To Toilet Outcome: Adequate for Discharge

## 2021-07-08 NOTE — TOC Transition Note (Signed)
Transition of Care [facility]) - CM/SW Discharge Note   Patient Details  Name: Casey Patel MRN: [id number] Date of Birth: 04/03/1930  [facility] CM/SW Contact:  Milas Gain, [place] Phone Number: 07/08/2021, 11:54 AM   Clinical Narrative:     Patient will DC to: [facility] with palliative services to follow.  Anticipated DC date: 07/08/2021  Family notified: Vaughan Basta  Transport by: Corey Harold  ?  Per MD patient ready for DC to [facility] with palliative services to follow. RN, patient, patient's family,Shanita with [facility], and facility notified of DC. Discharge Summary sent to facility. RN given number for report tele#[phone] RM# [phone]. DC packet on chart. Ambulance transport requested for patient.  CSW signing off.   Final next level of care: Skilled Nursing Facility Barriers to Discharge: No Barriers Identified   Patient Goals and CMS Choice   CMS Medicare.gov Compare Post Acute Care list provided to:: Patient Represenative (must comment) (Patients sister Vaughan Basta) Choice offered to / list presented to : Sibling (Patients sister Vaughan Basta)  Discharge Placement              Patient chooses bed at: [facility] Patient to be transferred to facility by: [place] Name of family member notified: Vaughan Basta Patient and family notified of of transfer: 07/08/21  Discharge Plan and Services In-house Referral: Clinical Social Work                                   Social Determinants of Health ([place]) Interventions     Readmission Risk Interventions Readmission Risk Prevention Plan 07/04/2021 06/26/2021  Transportation Screening Complete Complete  PCP or Specialist Appt within 5-7 Days - Complete  Home Care Screening - Complete  Medication Review (RN CM) - Complete  HRI or Home Care Consult Complete -  Social Work Consult for Recovery Care Planning/Counseling Complete -  Palliative Care Screening Complete -  Some recent data might be hidden

## 2021-07-08 NOTE — Plan of Care (Signed)
  Problem: Cardiac: Goal: Ability to achieve and maintain adequate cardiopulmonary perfusion will improve Outcome: Progressing   

## 2021-07-08 NOTE — Progress Notes (Signed)
OT Cancellation Note  Patient Details Name: Casey Patel MRN: [id number] DOB: 08/27/1929   Cancelled Treatment:    Reason Eval/Treat Not Completed: Other (comment) (pt preparing to d/c to SNF)  Britt Bottom 07/08/2021, 1:21 PM

## 2021-07-08 NOTE — Discharge Summary (Signed)
Physician Discharge Summary  Casey Patel OMV:[id number] DOB: 1930/01/20 DOA: 07/02/2021  PCP: [name], MD  Admit date: 07/02/2021 Discharge date: 07/08/2021  Time spent: 35 minutes  Recommendations for Outpatient Follow-up:  PCP in 1 week, please assess for diuretics at follow-up if blood pressure tolerates Palliative care follow-up at SNF for ongoing goals of care discussions   Discharge Diagnoses:  Principal Problem:   Atrial fibrillation with RVR ([place]) Active Problems:   Hypertension   Hyperlipidemia   DM (diabetes mellitus) ([place])   Goals of care, counseling/discussion   Generalized weakness   Palliative care by specialist   Advanced care planning/counseling discussion Chronic systolic CHF, EF 62-83% Adult failure to thrive  Discharge Condition: Stable  Diet recommendation: Low-sodium,  carb modified  Filed Weights   07/04/21 0438 07/06/21 0412 07/07/21 0515  Weight: 85.6 kg 79.1 kg 80.5 kg    History of present illness:  89/F with general debility, hypertension, diabetes mellitus, history of atrial fibrillation recently hospitalized with UTI, sepsis, on 12/30, family declined SNF options and went home instead, came back to the ER on 1/2 with A. fib RVR, cardiology recommended changing her Toprol 1 from 150 mg daily to 100 mg twice daily. Subsequently patient has been weaker than usual and less able to participate in ADLs, heart rate has been persistently elevated and was brought back to the ER  Hospital Course:   Atrial fibrillation with RVR ([place])- (present on admission) -Long history of A. fib presenting with persistent A. fib RVR  -Recently Toprol XL dose was increased to 100 mg twice daily  -Toprol resumed, 100 mg twice daily -Weaned off Cardizem drip  -Started on digoxin, heart rate improving -2D echo with EF of 45-50%, no regional wall motion abnormalities, moderate PAH, given advanced age, debility and memory deficits with intermittent confusion she is  not a candidate for further work-up, remains euvolemic -Coumadin continued, -Palliative care consulted for goals of care, high risk of quick readmission, goals discussed with daughter, plan to continue current care, conservative management, continue DNR -PT OT eval completed, she will be discharged to SNF for short-term rehab  Chronic systolic CHF, EF of 66-29% -Remained euvolemic throughout this admission, due to advanced age and debility, further work-up not pursued, and blood pressure soft, would not be able to tolerate goal-directed medical therapy -Assess need for diuretics at follow-up   Leukocytosis -Reactive, resolved   HTN -Toprol resumed, blood pressures soft but stable   DM -Last A1c was 6.0 -Glucophage resumed   Cognitive deficits, intermittent confusion -Stable, now   Goals of care, counseling/discussion -Discussed with sister, she is DNR, at risk for frequent rehospitalizations and ongoing decline -Palliative consulted for goals of care discussions, palliative care evaluation still pending at discharge, this can be completed at SNF, at this time she is hemodynamically stable, eating and drinking and awake alert, oriented x2   General debility, failure to thrive -Plan for SNF for rehab and palliative care follow-up  Discharge Exam: Vitals:   07/07/21 2054 07/08/21 0333  BP: 112/66 135/67  Pulse: 91 95  Resp: 18 18  Temp: 98.2 F (36.8 C) 97.9 F (36.6 C)  SpO2: 93% 94%    General: Obese chronically ill female, awake alert, oriented to self and place, mild cognitive deficits Cardiovascular: S1-S2, irregularly irregular rhythm Respiratory: Poor air movement bilaterally  Discharge Instructions   Discharge Instructions     Amb Referral to Palliative Care   Complete by: As directed    Diet - low  sodium heart healthy   Complete by: As directed    Discharge wound care:   Complete by: As directed    routine   Increase activity slowly   Complete by: As  directed       Allergies as of 07/08/2021       Reactions   Wheat Bran Shortness Of Breath   Kiwi Extract Other (See Comments)   Causes mouth sores   Orange Fruit [citrus] Other (See Comments)   Causes mouth sores   Phenergan [promethazine] Other (See Comments)   Disorientation   Pineapple Other (See Comments)   Causes mouth sores   Promethazine Hcl Other (See Comments)   Disorientation   Tomato Other (See Comments)   Causes mouth sores   Tylenol [acetaminophen] Other (See Comments)   Causes fast heart rate   Vistaril [hydroxyzine Hcl] Other (See Comments)   Confusion   Codeine Itching, Rash   Tape Rash, Other (See Comments)   Blisters on skin        Medication List     STOP taking these medications    bacitracin ointment       TAKE these medications    digoxin 0.125 MG tablet Commonly known as: LANOXIN Take 1 tablet (0.125 mg total) by mouth daily. Start taking on: July 09, 2021   docusate sodium 100 MG capsule Commonly known as: COLACE Take 1 capsule (100 mg total) by mouth daily as needed for mild constipation. What changed: when to take this   fluticasone furoate-vilanterol 100-25 MCG/INH Aepb Commonly known as: BREO ELLIPTA Inhale 1 puff into the lungs at bedtime.   loratadine 10 MG tablet Commonly known as: CLARITIN Take 10 mg by mouth daily.   metFORMIN 500 MG tablet Commonly known as: GLUCOPHAGE Take 500 mg by mouth 2 (two) times daily with a meal.   methocarbamol 750 MG tablet Commonly known as: ROBAXIN Take 750 mg by mouth 3 (three) times daily.   metoprolol succinate 100 MG 24 hr tablet Commonly known as: TOPROL-XL Take 1 tablet (100 mg total) by mouth 2 (two) times daily. What changed:  how much to take when to take this   multivitamin with minerals Tabs tablet Take 1 tablet by mouth daily.   feeding supplement (GLUCERNA SHAKE) Liqd Take 237 mLs by mouth 2 (two) times daily between meals.   nutrition supplement (JUVEN)  Pack Take 1 packet by mouth 2 (two) times daily with a meal.   omeprazole 20 MG capsule Commonly known as: PRILOSEC Take 20 mg by mouth daily before breakfast.   ProAir HFA 108 (90 Base) MCG/ACT inhaler Generic drug: albuterol Inhale 2 puffs into the lungs every 6 (six) hours as needed for wheezing or shortness of breath.   traMADol 50 MG tablet Commonly known as: ULTRAM Take 1 tablet (50 mg total) by mouth every 6 (six) hours as needed for up to 4 doses for moderate pain (pain). What changed: reasons to take this   vitamin C 1000 MG tablet Take 1,000 mg by mouth daily.   Vitamin D3 50 MCG (2000 UT) Tabs Take 2,000 Units by mouth daily.   warfarin 5 MG tablet Commonly known as: COUMADIN Take 2.5 mg by mouth See admin instructions. Take 2.5 mg by mouth with supper on Sun/Tues/Wed/Thurs/Sat and nothing on Mondays and Fridays               Discharge Care Instructions  (From admission, onward)           Start  Ordered   07/08/21 0000  Discharge wound care:       Comments: routine   07/08/21 1100           Allergies  Allergen Reactions   Wheat Bran Shortness Of Breath   Kiwi Extract Other (See Comments)    Causes mouth sores   Orange Fruit [Citrus] Other (See Comments)    Causes mouth sores   Phenergan [Promethazine] Other (See Comments)    Disorientation    Pineapple Other (See Comments)    Causes mouth sores    Promethazine Hcl Other (See Comments)    Disorientation    Tomato Other (See Comments)    Causes mouth sores   Tylenol [Acetaminophen] Other (See Comments)    Causes fast heart rate   Vistaril [Hydroxyzine Hcl] Other (See Comments)    Confusion    Codeine Itching and Rash   Tape Rash and Other (See Comments)    Blisters on skin      The results of significant diagnostics from this hospitalization (including imaging, microbiology, ancillary and laboratory) are listed below for reference.    Significant Diagnostic Studies: DG  Wrist Complete Right  Result Date: 06/24/2021 CLINICAL DATA:  Right wrist pain after fall yesterday. EXAM: RIGHT WRIST - COMPLETE 3+ VIEW COMPARISON:  March 09, 2018. FINDINGS: There is no evidence of fracture or dislocation. Severe degenerative changes seen involving the radiocarpal joint as well as the first metacarpophalangeal joint. Soft tissues are unremarkable. IMPRESSION: Severe degenerative changes as described above. No acute abnormality seen. Electronically Signed   By: Marijo Conception M.D.   On: 06/24/2021 16:14   CT HEAD WO CONTRAST (5MM)  Result Date: 06/24/2021 CLINICAL DATA:  Head trauma, minor. Increased falls with weakness and confusion. EXAM: CT HEAD WITHOUT CONTRAST CT CERVICAL SPINE WITHOUT CONTRAST TECHNIQUE: Multidetector CT imaging of the head and cervical spine was performed following the standard protocol without intravenous contrast. Multiplanar CT image reconstructions of the cervical spine were also generated. COMPARISON:  Prior CTs 07/16/2011 FINDINGS: CT HEAD FINDINGS Due to extreme cervicothoracic kyphosis, the images were acquired in a direct nearly coronal plane. Plane of imaging affects image quality. Brain: There is no evidence of acute intracranial hemorrhage, mass lesion, brain edema or extra-axial fluid collection. The ventricles and subarachnoid spaces are appropriately sized for age. Low-density in the periventricular white matter appear similar to previous study, likely due to chronic small vessel ischemic changes. No evidence of acute cortical based infarct. Vascular: Prominent intracranial vascular calcifications. No hyperdense vessel identified. Skull: Negative for fracture or focal lesion. Calvarial hyperostosis noted. Sinuses/Orbits: The visualized paranasal sinuses and mastoid air cells are clear. No orbital abnormalities are seen. Other: Advanced TMJ degenerative changes bilaterally. CT CERVICAL SPINE FINDINGS Alignment: Exaggerated cervicothoracic kyphosis  without evidence of focal angulation or progressive listhesis. Skull base and vertebrae: No evidence of acute cervical spine fracture or traumatic subluxation. There is multilevel spondylosis. The right C3-4 facet joint appears ankylosed. As above, bilateral TMJ osteoarthritis. Soft tissues and spinal canal: No prevertebral fluid or swelling. No visible canal hematoma. Disc levels: Chronic multilevel spondylosis with endplate osteophytes and facet hypertrophy contributing to mild foraminal narrowing at multiple levels. No large disc herniation identified. Upper chest: Emphysematous changes at the lung apices. Aortic and great vessel atherosclerosis. Other: None. IMPRESSION: 1. No acute intracranial or calvarial findings. 2. Chronic small vessel ischemic changes in the periventricular white matter. 3. No evidence of acute cervical spine fracture, traumatic subluxation or static signs of instability.  4. Chronic cervicothoracic kyphosis and multilevel spondylosis. Electronically Signed   By: Richardean Sale M.D.   On: 06/24/2021 16:17   CT CERVICAL SPINE WO CONTRAST  Result Date: 06/24/2021 CLINICAL DATA:  Head trauma, minor. Increased falls with weakness and confusion. EXAM: CT HEAD WITHOUT CONTRAST CT CERVICAL SPINE WITHOUT CONTRAST TECHNIQUE: Multidetector CT imaging of the head and cervical spine was performed following the standard protocol without intravenous contrast. Multiplanar CT image reconstructions of the cervical spine were also generated. COMPARISON:  Prior CTs 07/16/2011 FINDINGS: CT HEAD FINDINGS Due to extreme cervicothoracic kyphosis, the images were acquired in a direct nearly coronal plane. Plane of imaging affects image quality. Brain: There is no evidence of acute intracranial hemorrhage, mass lesion, brain edema or extra-axial fluid collection. The ventricles and subarachnoid spaces are appropriately sized for age. Low-density in the periventricular white matter appear similar to previous  study, likely due to chronic small vessel ischemic changes. No evidence of acute cortical based infarct. Vascular: Prominent intracranial vascular calcifications. No hyperdense vessel identified. Skull: Negative for fracture or focal lesion. Calvarial hyperostosis noted. Sinuses/Orbits: The visualized paranasal sinuses and mastoid air cells are clear. No orbital abnormalities are seen. Other: Advanced TMJ degenerative changes bilaterally. CT CERVICAL SPINE FINDINGS Alignment: Exaggerated cervicothoracic kyphosis without evidence of focal angulation or progressive listhesis. Skull base and vertebrae: No evidence of acute cervical spine fracture or traumatic subluxation. There is multilevel spondylosis. The right C3-4 facet joint appears ankylosed. As above, bilateral TMJ osteoarthritis. Soft tissues and spinal canal: No prevertebral fluid or swelling. No visible canal hematoma. Disc levels: Chronic multilevel spondylosis with endplate osteophytes and facet hypertrophy contributing to mild foraminal narrowing at multiple levels. No large disc herniation identified. Upper chest: Emphysematous changes at the lung apices. Aortic and great vessel atherosclerosis. Other: None. IMPRESSION: 1. No acute intracranial or calvarial findings. 2. Chronic small vessel ischemic changes in the periventricular white matter. 3. No evidence of acute cervical spine fracture, traumatic subluxation or static signs of instability. 4. Chronic cervicothoracic kyphosis and multilevel spondylosis. Electronically Signed   By: Richardean Sale M.D.   On: 06/24/2021 16:17   DG Hand 2 View Right  Result Date: 06/24/2021 CLINICAL DATA:  [place], bruising EXAM: RIGHT HAND - 2 VIEW COMPARISON:  03/09/2018 FINDINGS: Frontal and lateral views of the right hand are obtained. Bones are severely osteopenic. There is severe diffuse osteoarthritis, most pronounced within the interphalangeal joints and radial aspect of the carpus. Chronic widening of the  scapholunate interval, without significant migration of the capitate. No acute displaced fractures. Mild soft tissue swelling throughout the hand and wrist. IMPRESSION: 1. Severe multifocal osteoarthritis and osteopenia. 2. No acute displaced fracture. 3. Mild diffuse soft tissue swelling. Electronically Signed   By: Randa Ngo M.D.   On: 06/24/2021 21:20   DG CHEST PORT 1 VIEW  Result Date: 07/06/2021 CLINICAL DATA:  Chief complaint-atrial fibrillation and weakness Reason for exam: leucocytosis EXAM: PORTABLE CHEST 1 VIEW COMPARISON:  07/02/2021. FINDINGS: Cardiac silhouette mildly enlarged. Patchy right mid lung airspace opacity in bilateral lung base opacities. No significant change from the prior exam allowing for differences in patient positioning and technique. No gross pneumothorax. IMPRESSION: 1. Limited study. Persistent lung base opacities consistent with a combination of pleural effusions and atelectasis/consolidation. Additional small area of atelectasis or consolidation in the right mid lung. No convincing pulmonary edema. No significant change from the most recent prior study. Electronically Signed   By: Lajean Manes M.D.   On: 07/06/2021 08:35  DG Chest Port 1 View  Result Date: 07/02/2021 CLINICAL DATA:  Weakness EXAM: PORTABLE CHEST 1 VIEW COMPARISON:  06/30/2021 FINDINGS: Cardiac enlargement.  Negative for heart failure. Mild bibasilar airspace disease and small bilateral effusions unchanged. Underlying chronic lung disease. IMPRESSION: No interval change. Bibasilar airspace disease with small bilateral pleural effusions. Electronically Signed   By: Franchot Gallo M.D.   On: 07/02/2021 11:55   DG Chest Portable 1 View  Result Date: 06/30/2021 CLINICAL DATA:  Tachycardia. EXAM: PORTABLE CHEST 1 VIEW COMPARISON:  07/23/2020. FINDINGS: The heart is enlarged. Atherosclerotic calcification of the aorta is noted. Stable calcifications are noted at the mediastinum and hilar regions  bilaterally, however the upper mediastinum and lung apices are not well seen due to overlapping structures. The pulmonary vasculature is not well seen. There is patchy airspace disease at the lung bases with small bilateral pleural effusions. No definite pneumothorax. No acute osseous abnormality. IMPRESSION: 1. Cardiomegaly. 2. Patchy airspace disease at the lung bases with small bilateral pleural effusions. 3. Aortic atherosclerosis. Electronically Signed   By: Brett Fairy M.D.   On: 06/30/2021 20:13   DG Knee Complete 4 Views Right  Result Date: 06/24/2021 CLINICAL DATA:  Right knee pain after fall yesterday. EXAM: RIGHT KNEE - COMPLETE 4+ VIEW COMPARISON:  None. FINDINGS: No evidence of fracture, dislocation, or joint effusion. Moderate narrowing of medial joint space is noted. Chondrocalcinosis is noted laterally with minimal osteophyte formation. Soft tissues are unremarkable. IMPRESSION: Moderate degenerative joint disease.  No acute abnormality seen. Electronically Signed   By: Marijo Conception M.D.   On: 06/24/2021 16:16   ECHOCARDIOGRAM COMPLETE  Result Date: 07/04/2021    ECHOCARDIOGRAM REPORT   Patient Name:   Casey Patel Date of Exam: 07/04/2021 Medical Rec #:  [id number]    Height:       66.0 in Accession #:    [id number]   Weight:       188.7 lb Date of Birth:  23-Dec-1929     BSA:          1.951 m Patient Age:    81 years     BP:           107/64 mmHg Patient Gender: F            HR:           91 bpm. Exam Location:  Inpatient Procedure: 2D Echo, Color Doppler and Cardiac Doppler Indications:    R06.9 DOE  History:        Patient has prior history of Echocardiogram examinations, most                 recent 03/08/2018. Arrythmias:Atrial Fibrillation; Risk                 Factors:Hypertension, Diabetes and Dyslipidemia.  Sonographer:    Raquel Sarna Senior RDCS Referring Phys: [place]  Sonographer Comments: VERY lateral windows, technically difficult due to significant kyphosis. IMPRESSIONS   1. Left ventricular ejection fraction, by estimation, is 45 to 50%. The left ventricle has mildly decreased function. The left ventricle demonstrates regional wall motion abnormalities (see scoring diagram/findings for description). Left ventricular diastolic parameters are indeterminate.  2. Right ventricular systolic function is normal. The right ventricular size is normal. There is moderately elevated pulmonary artery systolic pressure.  3. Left atrial size was severely dilated.  4. Right atrial size was severely dilated.  5. The mitral valve is normal in structure. Mild mitral valve regurgitation. No  evidence of mitral stenosis.  6. Tricuspid valve regurgitation is mild to moderate.  7. The aortic valve is tricuspid. There is mild calcification of the aortic valve. There is mild thickening of the aortic valve. Aortic valve regurgitation is not visualized. Aortic valve sclerosis is present, with no evidence of aortic valve stenosis.  8. The inferior vena cava is normal in size with <50% respiratory variability, suggesting right atrial pressure of 8 mmHg. FINDINGS  Left Ventricle: Left ventricular ejection fraction, by estimation, is 45 to 50%. The left ventricle has mildly decreased function. The left ventricle demonstrates regional wall motion abnormalities. The left ventricular internal cavity size was normal in size. There is no left ventricular hypertrophy. Left ventricular diastolic parameters are indeterminate.  LV Wall Scoring: The entire anterior septum and entire inferior wall are hypokinetic. The entire anterior wall, entire lateral wall, mid inferoseptal segment, basal inferoseptal segment, and apex are normal. Right Ventricle: The right ventricular size is normal. No increase in right ventricular wall thickness. Right ventricular systolic function is normal. There is moderately elevated pulmonary artery systolic pressure. The tricuspid regurgitant velocity is 3.06 m/s, and with an assumed right atrial  pressure of 8 mmHg, the estimated right ventricular systolic pressure is 96.2 mmHg. Left Atrium: Left atrial size was severely dilated. Right Atrium: Right atrial size was severely dilated. Pericardium: There is no evidence of pericardial effusion. Mitral Valve: The mitral valve is normal in structure. Mild mitral valve regurgitation. No evidence of mitral valve stenosis. Tricuspid Valve: The tricuspid valve is normal in structure. Tricuspid valve regurgitation is mild to moderate. No evidence of tricuspid stenosis. Aortic Valve: The aortic valve is tricuspid. There is mild calcification of the aortic valve. There is mild thickening of the aortic valve. Aortic valve regurgitation is not visualized. Aortic valve sclerosis is present, with no evidence of aortic valve stenosis. Pulmonic Valve: The pulmonic valve was normal in structure. Pulmonic valve regurgitation is trivial. No evidence of pulmonic stenosis. Aorta: The aortic root is normal in size and structure. Venous: The inferior vena cava is normal in size with less than 50% respiratory variability, suggesting right atrial pressure of 8 mmHg. IAS/Shunts: No atrial level shunt detected by color flow Doppler.  LEFT VENTRICLE PLAX 2D LVIDd:         4.20 cm LVIDs:         2.80 cm LV PW:         0.90 cm LV IVS:        0.80 cm LVOT diam:     2.00 cm     3D Volume EF: LV SV:         39          3D EF:        44 % LV SV Index:   20          LV EDV:       87 ml LVOT Area:     3.14 cm    LV ESV:       49 ml                            LV SV:        39 ml  LV Volumes (MOD) LV vol d, MOD A2C: 69.0 ml LV vol d, MOD A4C: 60.3 ml LV vol s, MOD A2C: 42.1 ml LV vol s, MOD A4C: 33.3 ml LV SV MOD A2C:     26.9  ml LV SV MOD A4C:     60.3 ml LV SV MOD BP:      28.6 ml RIGHT VENTRICLE RV S prime:     9.79 cm/s TAPSE (M-mode): 1.6 cm LEFT ATRIUM              Index        RIGHT ATRIUM           Index LA diam:        4.10 cm  2.10 cm/m   RA Area:     39.70 cm LA Vol (A2C):   108.0 ml  55.36 ml/m  RA Volume:   157.00 ml 80.47 ml/m LA Vol (A4C):   66.3 ml  33.98 ml/m LA Biplane Vol: 86.4 ml  44.28 ml/m  AORTIC VALVE LVOT Vmax:   67.00 cm/s LVOT Vmean:  45.000 cm/s LVOT VTI:    0.125 m  AORTA Ao Root diam: 3.40 cm Ao Asc diam:  3.40 cm TRICUSPID VALVE TR Peak grad:   37.5 mmHg TR Vmax:        306.00 cm/s  SHUNTS Systemic VTI:  0.12 m Systemic Diam: 2.00 cm Skeet Latch MD Electronically signed by Skeet Latch MD Signature Date/Time: 07/04/2021/2:34:12 PM    Final     Microbiology: Recent Results (from the past 240 hour(s))  Urine Culture     Status: None   Collection Time: 06/30/21  1:30 PM   Specimen: In/Out Cath Urine  Result Value Ref Range Status   Specimen Description   Final    IN/OUT CATH URINE Performed at [facility], [address], [place], [place] [address]    Special Requests   Final    NONE Performed at [facility], [address] [place], [place] [address]    Culture   Final    NO GROWTH Performed at [facility], [place] [address] [place], [place] [address]    Report Status 07/02/2021 FINAL  Final  Resp Panel by RT-PCR (Flu A&B, Covid) Nasopharyngeal Swab     Status: None   Collection Time: 07/02/21 11:35 AM   Specimen: Nasopharyngeal Swab; Nasopharyngeal(NP) swabs in vial transport medium  Result Value Ref Range Status   SARS Coronavirus 2 by RT PCR NEGATIVE NEGATIVE Final    Comment: (NOTE) SARS-CoV-2 target nucleic acids are NOT DETECTED.  The SARS-CoV-2 RNA is generally detectable in upper respiratory specimens during the acute phase of infection. The lowest concentration of SARS-CoV-2 viral copies this assay can detect is 138 copies/mL. A negative result does not preclude SARS-Cov-2 infection and should not be used as the sole basis for treatment or other patient management decisions. A negative result may occur with  improper specimen collection/handling, submission of specimen other than nasopharyngeal swab, presence of viral  mutation(s) within the areas targeted by this assay, and inadequate number of viral copies(<138 copies/mL). A negative result must be combined with clinical observations, patient history, and epidemiological information. The expected result is Negative.  Fact Sheet for Patients:  EntrepreneurPulse.com.au  Fact Sheet for Healthcare Providers:  IncredibleEmployment.be  This test is no t yet approved or cleared by the [place] FDA and  has been authorized for detection and/or diagnosis of SARS-CoV-2 by FDA under an Emergency Use Authorization (EUA). This EUA will remain  in effect (meaning this test can be used) for the duration of the COVID-19 declaration under Section 564(b)(1) of the Act, 21 U.S.C.section 360bbb-3(b)(1), unless the authorization is terminated  or revoked sooner.  Influenza A by PCR NEGATIVE NEGATIVE Final   Influenza B by PCR NEGATIVE NEGATIVE Final    Comment: (NOTE) The Xpert Xpress SARS-CoV-2/FLU/RSV plus assay is intended as an aid in the diagnosis of influenza from Nasopharyngeal swab specimens and should not be used as a sole basis for treatment. Nasal washings and aspirates are unacceptable for Xpert Xpress SARS-CoV-2/FLU/RSV testing.  Fact Sheet for Patients: EntrepreneurPulse.com.au  Fact Sheet for Healthcare Providers: IncredibleEmployment.be  This test is not yet approved or cleared by the [place] FDA and has been authorized for detection and/or diagnosis of SARS-CoV-2 by FDA under an Emergency Use Authorization (EUA). This EUA will remain in effect (meaning this test can be used) for the duration of the COVID-19 declaration under Section 564(b)(1) of the Act, 21 U.S.C. section 360bbb-3(b)(1), unless the authorization is terminated or revoked.  Performed at [facility], [place] [address] [place], [place] [address]   Urine Culture     Status: Abnormal    Collection Time: 07/06/21 11:52 AM   Specimen: Urine, Clean Catch  Result Value Ref Range Status   Specimen Description URINE, CLEAN CATCH  Final   Special Requests   Final    NONE Performed at [facility], [place] [address] [place], [place] [address]    Culture MULTIPLE SPECIES PRESENT, SUGGEST RECOLLECTION (A)  Final   Report Status 07/07/2021 FINAL  Final  SARS CORONAVIRUS 2 (TAT 6-24 HRS) Nasopharyngeal Nasopharyngeal Swab     Status: None   Collection Time: 07/07/21  1:35 PM   Specimen: Nasopharyngeal Swab  Result Value Ref Range Status   SARS Coronavirus 2 NEGATIVE NEGATIVE Final    Comment: (NOTE) SARS-CoV-2 target nucleic acids are NOT DETECTED.  The SARS-CoV-2 RNA is generally detectable in upper and lower respiratory specimens during the acute phase of infection. Negative results do not preclude SARS-CoV-2 infection, do not rule out co-infections with other pathogens, and should not be used as the sole basis for treatment or other patient management decisions. Negative results must be combined with clinical observations, patient history, and epidemiological information. The expected result is Negative.  Fact Sheet for Patients: SugarRoll.be  Fact Sheet for Healthcare Providers: https://www.woods-mathews.com/  This test is not yet approved or cleared by the [place] FDA and  has been authorized for detection and/or diagnosis of SARS-CoV-2 by FDA under an Emergency Use Authorization (EUA). This EUA will remain  in effect (meaning this test can be used) for the duration of the COVID-19 declaration under Se ction 564(b)(1) of the Act, 21 U.S.C. section 360bbb-3(b)(1), unless the authorization is terminated or revoked sooner.  Performed at [facility], [place] [address], [place], [place] [address]      Labs: Basic Metabolic Panel: Recent Labs  Lab 07/03/21 0241 07/04/21 0209 07/05/21 0238 07/06/21 0321  07/08/21 0028  NA 137 134* 134* 136 135  K 4.3 4.2 3.9 4.3 4.7  CL 99 98 95* 98 97*  CO2 32 28 32 34* 31  GLUCOSE 130* 150* 138* 136* 110*  BUN 31* 30* 24* 21 11  CREATININE 0.61 0.46 0.43* 0.47 0.39*  CALCIUM 8.3* 8.0* 8.3* 7.9* 7.8*   Liver Function Tests: Recent Labs  Lab 07/02/21 1155  AST 28  ALT 19  ALKPHOS 68  BILITOT 0.7  PROT 5.8*  ALBUMIN 2.3*   Recent Labs  Lab 07/02/21 1155  LIPASE 20   No results for input(s): AMMONIA in the last 168 hours. CBC: Recent Labs  Lab  07/02/21 1155 07/03/21 0241 07/04/21 0209 07/05/21 0238 07/06/21 0321 07/08/21 0028  WBC 10.3 9.0 10.3 10.4 15.0* 10.5  NEUTROABS 9.0*  --   --   --   --   --   HGB 9.4* 9.5* 9.3* 9.6* 9.6* 9.6*  HCT 31.7* 32.3* 30.3* 31.3* 30.2* 31.1*  MCV 91.1 90.5 88.3 87.9 86.5 87.9  PLT 329 331 352 351 380 429*   Cardiac Enzymes: No results for input(s): CKTOTAL, CKMB, CKMBINDEX, TROPONINI in the last 168 hours. BNP: BNP (last 3 results) Recent Labs    07/02/21 1217  BNP 283.1*    ProBNP (last 3 results) No results for input(s): PROBNP in the last 8760 hours.  CBG: Recent Labs  Lab 07/07/21 0754 07/07/21 1148 07/07/21 1552 07/07/21 2148 07/08/21 0744  GLUCAP 125* 133* 175* 110* 120*       Signed:  Domenic Polite MD.  [facility] 07/08/2021, 11:00 AM

## 2021-07-09 ENCOUNTER — Other Ambulatory Visit: Payer: Self-pay | Admitting: *Deleted

## 2021-07-09 NOTE — Patient Outreach (Addendum)
[facility] Coordinator follow up. Mrs. Perea resides in [facility]. Mrs. Morgenstern admitted to SNF on 07/08/21 after a hospitalization for afib presenting with persistent tachycardia.  Facility site visit to [facility].  Met with Antoine Primas, SNF SW concerning member's  transition plan and potential [facility] needs. Discussed palliative follow up was ordered on hospital dc summary. Antoine Primas states she will send palliative referral to [facility] palliative. Mrs. Bathe lives with sister who is primary caregiver.   Spoke with Mrs. Beauchamp and sister Janalyn Shy at bedside at [facility]. Janalyn Shy confirms she is Mrs. Philbrook primary caregiver and that they live together. Vaughan Basta reports Mrs. Carriere was "up and about" until after Christmas when she fell on 06/23/21. States Mrs. Och has declined since the fall. Vaughan Basta states she is not sure what the transition plan is as of yet. Mrs. Claytor just admitted the other day. Vaughan Basta is hopeful that Mrs. Cassarino is able to progress with therapy.   Discussed [facility] Care Management follow up. Confirmed member's PCP is Dr. Osborne Casco with [facility]. Discussed that writer will continue to follow for transition plans and care management needs. Provided Vaughan Basta with [facility] Management brochure, 24-hr nurse advice line magnet, and writer's contact information.   Will continue to collaborate with SNF SW and continue to follow while member resides in SNF. Will plan [facility] Care Management referral if Mrs. Greenly returns home post SNF.   Janalyn Shy sister/DPR [phone] is primary contact.    Marthenia Rolling, MSN, RN,BSN [place] Acute Care Coordinator [phone] [facility]) [phone]  (Toll free office)

## 2021-07-10 DIAGNOSIS — L89890 Pressure ulcer of other site, unstageable: Secondary | ICD-10-CM | POA: Diagnosis not present

## 2021-07-11 DIAGNOSIS — L8990 Pressure ulcer of unspecified site, unspecified stage: Secondary | ICD-10-CM | POA: Diagnosis not present

## 2021-07-11 DIAGNOSIS — I509 Heart failure, unspecified: Secondary | ICD-10-CM | POA: Diagnosis not present

## 2021-07-11 DIAGNOSIS — R531 Weakness: Secondary | ICD-10-CM | POA: Diagnosis not present

## 2021-07-11 DIAGNOSIS — I4891 Unspecified atrial fibrillation: Secondary | ICD-10-CM | POA: Diagnosis not present

## 2021-07-15 DIAGNOSIS — I4891 Unspecified atrial fibrillation: Secondary | ICD-10-CM | POA: Diagnosis not present

## 2021-07-15 DIAGNOSIS — J45909 Unspecified asthma, uncomplicated: Secondary | ICD-10-CM | POA: Diagnosis not present

## 2021-07-15 DIAGNOSIS — N390 Urinary tract infection, site not specified: Secondary | ICD-10-CM | POA: Diagnosis not present

## 2021-07-15 DIAGNOSIS — R252 Cramp and spasm: Secondary | ICD-10-CM | POA: Diagnosis not present

## 2021-07-15 DIAGNOSIS — I10 Essential (primary) hypertension: Secondary | ICD-10-CM | POA: Diagnosis not present

## 2021-07-15 DIAGNOSIS — E119 Type 2 diabetes mellitus without complications: Secondary | ICD-10-CM | POA: Diagnosis not present

## 2021-07-15 DIAGNOSIS — R627 Adult failure to thrive: Secondary | ICD-10-CM | POA: Diagnosis not present

## 2021-07-15 DIAGNOSIS — K219 Gastro-esophageal reflux disease without esophagitis: Secondary | ICD-10-CM | POA: Diagnosis not present

## 2021-07-15 DIAGNOSIS — Z7901 Long term (current) use of anticoagulants: Secondary | ICD-10-CM | POA: Diagnosis not present

## 2021-07-15 DIAGNOSIS — M6281 Muscle weakness (generalized): Secondary | ICD-10-CM | POA: Diagnosis not present

## 2021-07-15 DIAGNOSIS — I5020 Unspecified systolic (congestive) heart failure: Secondary | ICD-10-CM | POA: Diagnosis not present

## 2021-07-15 DIAGNOSIS — R4189 Other symptoms and signs involving cognitive functions and awareness: Secondary | ICD-10-CM | POA: Diagnosis not present

## 2021-07-17 DIAGNOSIS — L89323 Pressure ulcer of left buttock, stage 3: Secondary | ICD-10-CM | POA: Diagnosis not present

## 2021-07-17 DIAGNOSIS — L89890 Pressure ulcer of other site, unstageable: Secondary | ICD-10-CM | POA: Diagnosis not present

## 2021-07-17 DIAGNOSIS — L89154 Pressure ulcer of sacral region, stage 4: Secondary | ICD-10-CM | POA: Diagnosis not present

## 2021-07-21 DIAGNOSIS — E785 Hyperlipidemia, unspecified: Secondary | ICD-10-CM | POA: Diagnosis not present

## 2021-07-21 DIAGNOSIS — J45909 Unspecified asthma, uncomplicated: Secondary | ICD-10-CM | POA: Diagnosis not present

## 2021-07-21 DIAGNOSIS — E119 Type 2 diabetes mellitus without complications: Secondary | ICD-10-CM | POA: Diagnosis not present

## 2021-07-21 DIAGNOSIS — I5020 Unspecified systolic (congestive) heart failure: Secondary | ICD-10-CM | POA: Diagnosis not present

## 2021-07-21 DIAGNOSIS — I10 Essential (primary) hypertension: Secondary | ICD-10-CM | POA: Diagnosis not present

## 2021-07-21 DIAGNOSIS — Z7901 Long term (current) use of anticoagulants: Secondary | ICD-10-CM | POA: Diagnosis not present

## 2021-07-23 ENCOUNTER — Other Ambulatory Visit: Payer: Self-pay | Admitting: *Deleted

## 2021-07-23 NOTE — Patient Outreach (Signed)
[facility] Post- Acute Care Coordinator follow up. Per [place] eligible member resides in  [facility].  Screened for potential [facility] Care Management services as a benefit of member's insurance plan.  Facility site visit to [facility] skilled nursing facility. Met with Antoine Primas, SNF SW concerning member's progress, transition plan, and potential [facility] needs. Anticipated transition remains to return home with sister.   Spoke with [name] in her room at [facility]. Sister/DPR Casey Patel was not at bedside at the time. Discussed that writer will follow up with sister regarding transition plans.   Will continue to collaborate with SNF SW and follow transition plans/needs while member resides in SNF.    Casey Rolling, MSN, RN,BSN [place] Acute Care Coordinator [phone] [facility]) [phone]  (Toll free office)

## 2021-07-24 DIAGNOSIS — L89323 Pressure ulcer of left buttock, stage 3: Secondary | ICD-10-CM | POA: Diagnosis not present

## 2021-07-24 DIAGNOSIS — L89154 Pressure ulcer of sacral region, stage 4: Secondary | ICD-10-CM | POA: Diagnosis not present

## 2021-07-24 DIAGNOSIS — L89890 Pressure ulcer of other site, unstageable: Secondary | ICD-10-CM | POA: Diagnosis not present

## 2021-07-28 DIAGNOSIS — L89159 Pressure ulcer of sacral region, unspecified stage: Secondary | ICD-10-CM | POA: Diagnosis not present

## 2021-07-28 DIAGNOSIS — I10 Essential (primary) hypertension: Secondary | ICD-10-CM | POA: Diagnosis not present

## 2021-07-28 DIAGNOSIS — Z7901 Long term (current) use of anticoagulants: Secondary | ICD-10-CM | POA: Diagnosis not present

## 2021-07-28 DIAGNOSIS — I5020 Unspecified systolic (congestive) heart failure: Secondary | ICD-10-CM | POA: Diagnosis not present

## 2021-07-28 DIAGNOSIS — I4891 Unspecified atrial fibrillation: Secondary | ICD-10-CM | POA: Diagnosis not present

## 2021-07-30 ENCOUNTER — Encounter: Payer: Self-pay | Admitting: Family Medicine

## 2021-07-30 ENCOUNTER — Non-Acute Institutional Stay: Payer: Medicare Other | Admitting: Family Medicine

## 2021-07-30 ENCOUNTER — Other Ambulatory Visit: Payer: Self-pay

## 2021-07-30 VITALS — BP 100/64 | HR 84 | Temp 96.9°F | Resp 18 | Wt 173.3 lb

## 2021-07-30 DIAGNOSIS — M6281 Muscle weakness (generalized): Secondary | ICD-10-CM | POA: Diagnosis not present

## 2021-07-30 DIAGNOSIS — R41841 Cognitive communication deficit: Secondary | ICD-10-CM | POA: Diagnosis not present

## 2021-07-30 DIAGNOSIS — L89154 Pressure ulcer of sacral region, stage 4: Secondary | ICD-10-CM | POA: Diagnosis present

## 2021-07-30 DIAGNOSIS — Z7901 Long term (current) use of anticoagulants: Secondary | ICD-10-CM | POA: Diagnosis not present

## 2021-07-30 DIAGNOSIS — Z515 Encounter for palliative care: Secondary | ICD-10-CM | POA: Diagnosis not present

## 2021-07-30 DIAGNOSIS — L89892 Pressure ulcer of other site, stage 2: Secondary | ICD-10-CM | POA: Diagnosis not present

## 2021-07-30 DIAGNOSIS — J45909 Unspecified asthma, uncomplicated: Secondary | ICD-10-CM | POA: Diagnosis present

## 2021-07-30 DIAGNOSIS — Z66 Do not resuscitate: Secondary | ICD-10-CM | POA: Diagnosis present

## 2021-07-30 DIAGNOSIS — M1990 Unspecified osteoarthritis, unspecified site: Secondary | ICD-10-CM | POA: Diagnosis present

## 2021-07-30 DIAGNOSIS — J918 Pleural effusion in other conditions classified elsewhere: Secondary | ICD-10-CM | POA: Diagnosis present

## 2021-07-30 DIAGNOSIS — J9601 Acute respiratory failure with hypoxia: Secondary | ICD-10-CM | POA: Diagnosis not present

## 2021-07-30 DIAGNOSIS — I119 Hypertensive heart disease without heart failure: Secondary | ICD-10-CM | POA: Diagnosis present

## 2021-07-30 DIAGNOSIS — R404 Transient alteration of awareness: Secondary | ICD-10-CM | POA: Diagnosis not present

## 2021-07-30 DIAGNOSIS — I4821 Permanent atrial fibrillation: Secondary | ICD-10-CM

## 2021-07-30 DIAGNOSIS — R4182 Altered mental status, unspecified: Secondary | ICD-10-CM | POA: Diagnosis not present

## 2021-07-30 DIAGNOSIS — R262 Difficulty in walking, not elsewhere classified: Secondary | ICD-10-CM | POA: Diagnosis not present

## 2021-07-30 DIAGNOSIS — M533 Sacrococcygeal disorders, not elsewhere classified: Secondary | ICD-10-CM | POA: Diagnosis not present

## 2021-07-30 DIAGNOSIS — I4891 Unspecified atrial fibrillation: Secondary | ICD-10-CM | POA: Diagnosis not present

## 2021-07-30 DIAGNOSIS — G9341 Metabolic encephalopathy: Secondary | ICD-10-CM | POA: Diagnosis present

## 2021-07-30 DIAGNOSIS — I959 Hypotension, unspecified: Secondary | ICD-10-CM | POA: Diagnosis not present

## 2021-07-30 DIAGNOSIS — L89893 Pressure ulcer of other site, stage 3: Secondary | ICD-10-CM | POA: Diagnosis not present

## 2021-07-30 DIAGNOSIS — R1110 Vomiting, unspecified: Secondary | ICD-10-CM | POA: Diagnosis present

## 2021-07-30 DIAGNOSIS — J9621 Acute and chronic respiratory failure with hypoxia: Secondary | ICD-10-CM | POA: Diagnosis not present

## 2021-07-30 DIAGNOSIS — M40204 Unspecified kyphosis, thoracic region: Secondary | ICD-10-CM | POA: Diagnosis present

## 2021-07-30 DIAGNOSIS — R001 Bradycardia, unspecified: Secondary | ICD-10-CM | POA: Diagnosis not present

## 2021-07-30 DIAGNOSIS — J9612 Chronic respiratory failure with hypercapnia: Secondary | ICD-10-CM | POA: Diagnosis not present

## 2021-07-30 DIAGNOSIS — N390 Urinary tract infection, site not specified: Secondary | ICD-10-CM | POA: Diagnosis present

## 2021-07-30 DIAGNOSIS — Y95 Nosocomial condition: Secondary | ICD-10-CM | POA: Diagnosis present

## 2021-07-30 DIAGNOSIS — M6259 Muscle wasting and atrophy, not elsewhere classified, multiple sites: Secondary | ICD-10-CM | POA: Diagnosis not present

## 2021-07-30 DIAGNOSIS — Z20822 Contact with and (suspected) exposure to covid-19: Secondary | ICD-10-CM | POA: Diagnosis present

## 2021-07-30 DIAGNOSIS — Z7189 Other specified counseling: Secondary | ICD-10-CM | POA: Diagnosis not present

## 2021-07-30 DIAGNOSIS — J189 Pneumonia, unspecified organism: Secondary | ICD-10-CM | POA: Diagnosis present

## 2021-07-30 DIAGNOSIS — R627 Adult failure to thrive: Secondary | ICD-10-CM | POA: Diagnosis not present

## 2021-07-30 DIAGNOSIS — J90 Pleural effusion, not elsewhere classified: Secondary | ICD-10-CM | POA: Diagnosis not present

## 2021-07-30 DIAGNOSIS — I10 Essential (primary) hypertension: Secondary | ICD-10-CM | POA: Diagnosis not present

## 2021-07-30 DIAGNOSIS — R2681 Unsteadiness on feet: Secondary | ICD-10-CM | POA: Diagnosis not present

## 2021-07-30 DIAGNOSIS — Z886 Allergy status to analgesic agent status: Secondary | ICD-10-CM | POA: Diagnosis not present

## 2021-07-30 DIAGNOSIS — R9431 Abnormal electrocardiogram [ECG] [EKG]: Secondary | ICD-10-CM | POA: Diagnosis present

## 2021-07-30 DIAGNOSIS — R0989 Other specified symptoms and signs involving the circulatory and respiratory systems: Secondary | ICD-10-CM | POA: Diagnosis not present

## 2021-07-30 DIAGNOSIS — I517 Cardiomegaly: Secondary | ICD-10-CM | POA: Diagnosis not present

## 2021-07-30 DIAGNOSIS — J9811 Atelectasis: Secondary | ICD-10-CM | POA: Diagnosis not present

## 2021-07-30 DIAGNOSIS — I5020 Unspecified systolic (congestive) heart failure: Secondary | ICD-10-CM | POA: Diagnosis not present

## 2021-07-30 DIAGNOSIS — E785 Hyperlipidemia, unspecified: Secondary | ICD-10-CM | POA: Diagnosis present

## 2021-07-30 DIAGNOSIS — E119 Type 2 diabetes mellitus without complications: Secondary | ICD-10-CM | POA: Diagnosis present

## 2021-07-30 DIAGNOSIS — J9622 Acute and chronic respiratory failure with hypercapnia: Secondary | ICD-10-CM | POA: Diagnosis not present

## 2021-07-30 DIAGNOSIS — E669 Obesity, unspecified: Secondary | ICD-10-CM | POA: Diagnosis present

## 2021-07-30 DIAGNOSIS — R791 Abnormal coagulation profile: Secondary | ICD-10-CM | POA: Diagnosis present

## 2021-07-30 DIAGNOSIS — B964 Proteus (mirabilis) (morganii) as the cause of diseases classified elsewhere: Secondary | ICD-10-CM | POA: Diagnosis present

## 2021-07-30 NOTE — Progress Notes (Signed)
[facility] Palliative Care Consult Note Telephone: [phone]  Fax: [phone]   Date of encounter: 07/30/21 2:21 PM PATIENT NAME: Casey Patel [address] [place] [address]   [phone] (home)  DOB: 10/02/1929 MRN: [id number] PRIMARY CARE PROVIDER:    [name], MD,  [place] [address] [phone]  REFERRING PROVIDER:   [name], [place],  [place] [address] [phone]  RESPONSIBLE PARTY:    Contact Information     Name Relation Home Work Mobile   Allen,Linda Sister [phone]  [phone]        I met face to face with patient in [facility] facility. Palliative Care was asked to follow this patient by consultation request of  Tisovec, Fransico Him, MD to address advance care planning and complex medical decision making. This is the initial visit.          ASSESSMENT, SYMPTOM MANAGEMENT AND PLAN / RECOMMENDATIONS:  Sacral pressure injury stage 2/partly unstageable Recommend continued use of medihoney except for areas of black eschar and would recommend change to use of nickel thick layer of Santyl on areas of black eschar, changed daily.   Reposition off back every 2-3 hours while awake. Continue air pressure reduction mattress. Continue protein shake supplementation and use of Juven  Atrial fibrillation Continue Digoxin 125 mcg daily and anticoagulation with 2.5 mg Coumadin everyday except Monday and Friday when none given. Continue to monitor INR per facility routine  Palliative Care Encounter Reviewed current Advance Directive documents. Agree with Tramadol 50 mg Q 6 hrs prn pain with Tylenol 650 mg Q 8 hrs prn between.  Follow up Palliative Care Visit: Palliative care will continue to follow for complex medical decision making, advance care planning, and clarification of goals. Return 4 weeks or prn.    This visit was coded based on medical decision  making (MDM).  PPS: 30%  HOSPICE ELIGIBILITY/DIAGNOSIS: TBD  Chief Complaint:  [facility] received a referral to follow up with patient for chronic disease management, advance directive and defining/refining goals of care.   HISTORY OF PRESENT ILLNESS:  Casey Patel is a 88 y.o. year old female afib with intermittent RVR, HTN, asthma, diabetes, sacral decubitus, bedbound with generalized weakness.  Pt with hx of UTI and sepsis on 06/27/21 went to home and went back to ER with Afib RVR when dose of Toprol XL increased from 150 mg daily to 100 mg BID, started on Digoxin.  2d Echo with EF 45-50%, moderate PAH. Last BMP 07/08/21 with low Ca 7.8, Albumin low at 2.3, CR low at 0.39.  WBC had normalized from 15.0 to 10.5, HGB 9.6 and elevated PLT 429.  TSH was normal 07/03/21 at 1.097.  INR decreased from 2.7 on 1/9 to 2.4 on 07/08/2021.  Just recently had air mattress added in the last 2-3 days, using Medihoney for sacral wound.  Pt is total care for ADLs except for feeding self and is incontinent of bowel and bladder. CT head 06/24/21:   1. No acute intracranial or calvarial findings. 2. Chronic small vessel ischemic changes in the periventricular white matter. 3. No evidence of acute cervical spine fracture, traumatic subluxation or static signs of instability. 4. Chronic cervicothoracic kyphosis and multilevel spondylosis. CXR cardiomegaly, persisten bibasilar opacities consistent with pleural effusions. Nursing indicates that sacral wound has some black eschar and slough. On approach pt lying supine in bed with neck on neck support pillow.  Pt has  noted dressing on sternum under her chin which is clean, dry and intact.  Denies pain at present time, SOB, orthopnea, falls.  She is bedbound in hospital bed requiring total care, incontinent of bowel and bladder. States she eats fairly well.   History obtained from review of EMR, discussion with primary team, and interview with  facility staff/caregiver and Ms. Lips.  I reviewed available labs, medications, imaging, studies and related documents from the EMR.  Records reviewed and summarized above.   ROS General: NAD EYES: denies vision changes ENMT: denies dysphagia Cardiovascular: denies chest pain, denies DOE Pulmonary: denies cough, denies increased SOB Abdomen: endorses fair appetite, endorses constipation, endorses incontinence of bowel GU: denies dysuria, endorses incontinence of urine MSK:  denies increased weakness, no falls reported Skin: denies rashes, has wounds right arm, sternum and over sacrum Dressed Neurological: denies pain, denies insomnia Psych: Endorses positive mood Heme/lymph/immuno: denies bruises, abnormal bleeding  Physical Exam: Current and past weights: 08/07/20-weight 178 lbs 6.4 ounces (80.9 kg), 07/09/2021 weight 173.3 lbs  Constitutional: NAD General: frail appearing EYES: anicteric sclera, lids intact, no discharge  ENMT: intact hearing, oral mucous membranes moist, dentition intact CV: S1S2, IRIR, trace non-pitting pedal edema bilat Pulmonary: CTAB, no increased work of breathing, no cough, on 2L [place] Abdomen: normo-active BS + 4 quadrants, soft and non tender, no ascites GU: deferred MSK: no sarcopenia, moves all extremities, bedbound Skin: cool and dry, no rashes.  Dressings to right arm, sternum under chin and over sacrum (unable to visualize wounds) Neuro:  noted generalized weakness, appears to be attempting to put something in her mouth but has nothing in her hand (Denies being hungry) Psych: non-anxious affect, A and O to self Hem/lymph/immuno: no widespread bruising  CURRENT PROBLEM LIST:  Patient Active Problem List   Diagnosis Date Noted   Generalized weakness    Palliative care by specialist    Advanced care planning/counseling discussion    Atrial flutter with rapid ventricular response ([place]) 07/02/2021   Goals of care, counseling/discussion 91/50/5697    Acute metabolic encephalopathy 94/80/1655   Atrial fibrillation with RVR ([place]) 06/25/2021   Subtherapeutic international normalized ratio (INR) 06/25/2021   UTI (urinary tract infection) 06/24/2021   Pressure injury of skin 03/08/2018   Atrial fibrillation ([place]) 03/07/2018   Cough 01/01/2012   Pneumonia 12/03/2011   Arthritis    Hypertension    Hyperlipidemia    Asthma    DM (diabetes mellitus) ([place])    PAST MEDICAL HISTORY:  Active Ambulatory Problems    Diagnosis Date Noted   Arthritis    Hypertension    Hyperlipidemia    Asthma    DM (diabetes mellitus) ([place])    Pneumonia 12/03/2011   Cough 01/01/2012   Atrial fibrillation ([place]) 03/07/2018   Pressure injury of skin 03/08/2018   UTI (urinary tract infection) 37/48/2707   Acute metabolic encephalopathy 86/75/4492   Atrial fibrillation with RVR ([place]) 06/25/2021   Subtherapeutic international normalized ratio (INR) 06/25/2021   Atrial flutter with rapid ventricular response ([place]) 07/02/2021   Goals of care, counseling/discussion 07/02/2021   Generalized weakness    Palliative care by specialist    Advanced care planning/counseling discussion    Resolved Ambulatory Problems    Diagnosis Date Noted   No Resolved Ambulatory Problems   Past Medical History:  Diagnosis Date   AF (atrial fibrillation) (HCC)    Asthma    Diabetes mellitus    PNA (pneumonia)    SOCIAL HX:  Social History  Tobacco Use   Smoking status: Former    Packs/day: 0.80    Years: 20.00    Pack years: 16.00    Types: Cigarettes    Quit date: 06/29/1978    Years since quitting: 43.1   Smokeless tobacco: Never  Substance Use Topics   Alcohol use: No   FAMILY HX:  Family History  Problem Relation Age of Onset   Hypertension Mother 68   Stroke Mother 79   Arthritis Mother 37   Heart attack Father 51   Heart disease Father 70   Hypertension Father 69   Ulcers Father 19   Breast cancer Sister    Heart disease Brother        CONGENTIAL  HEART DISEASE   Heart disease Sister    Diabetes Sister    Breast cancer Sister        Preferred Pharmacy: ALLERGIES:  Allergies  Allergen Reactions   Wheat Bran Shortness Of Breath   Kiwi Extract Other (See Comments)    Causes mouth sores   Orange Fruit [Citrus] Other (See Comments)    Causes mouth sores   Phenergan [Promethazine] Other (See Comments)    Disorientation    Pineapple Other (See Comments)    Causes mouth sores    Promethazine Hcl Other (See Comments)    Disorientation    Tomato Other (See Comments)    Causes mouth sores   Tylenol [Acetaminophen] Other (See Comments)    Causes fast heart rate   Vistaril [Hydroxyzine Hcl] Other (See Comments)    Confusion    Codeine Itching and Rash   Tape Rash and Other (See Comments)    Blisters on skin     PERTINENT MEDICATIONS:  Outpatient Encounter Medications as of 07/30/2021  Medication Sig   Ascorbic Acid (VITAMIN C) 1000 MG tablet Take 1,000 mg by mouth daily.   Cholecalciferol (VITAMIN D3) 50 MCG (2000 UT) TABS Take 2,000 Units by mouth daily.   digoxin (LANOXIN) 0.125 MG tablet Take 1 tablet (0.125 mg total) by mouth daily.   docusate sodium (COLACE) 100 MG capsule Take 1 capsule (100 mg total) by mouth daily as needed for mild constipation. (Patient taking differently: Take 100 mg by mouth daily.)   feeding supplement, GLUCERNA SHAKE, (GLUCERNA SHAKE) LIQD Take 237 mLs by mouth 2 (two) times daily between meals.   fluticasone furoate-vilanterol (BREO ELLIPTA) 100-25 MCG/INH AEPB Inhale 1 puff into the lungs at bedtime.   loratadine (CLARITIN) 10 MG tablet Take 10 mg by mouth daily.   metFORMIN (GLUCOPHAGE) 500 MG tablet Take 500 mg by mouth 2 (two) times daily with a meal.    methocarbamol (ROBAXIN) 750 MG tablet Take 750 mg by mouth 3 (three) times daily.    metoprolol succinate (TOPROL-XL) 100 MG 24 hr tablet Take 1 tablet (100 mg total) by mouth 2 (two) times daily.   nutrition supplement, JUVEN, (JUVEN) PACK  Take 1 packet by mouth 2 (two) times daily with a meal.   omeprazole (PRILOSEC) 20 MG capsule Take 20 mg by mouth daily before breakfast.   PROAIR HFA 108 (90 Base) MCG/ACT inhaler Inhale 2 puffs into the lungs every 6 (six) hours as needed for wheezing or shortness of breath.   traMADol (ULTRAM) 50 MG tablet Take 1 tablet (50 mg total) by mouth every 6 (six) hours as needed for up to 4 doses for moderate pain (pain).   warfarin (COUMADIN) 5 MG tablet Take 2.5 mg by mouth See admin instructions. Take 2.5  mg by mouth with supper on Sun/Tues/Wed/Thurs/Sat and nothing on Mondays and Fridays   No facility-administered encounter medications on file as of 07/30/2021.     -------------------------------------------------------------------------------------------------------------------------------------------------------------------------------------------------------------------------------------------- Advance Care Planning/Goals of Care: Goals include to maximize quality of life and symptom management. Patient/health care surrogate gave his/her permission to discuss.Our advance care planning conversation included a discussion about:    The value and importance of advance care planning-sister Janalyn Shy is [facility] POA Identification  of a healthcare agent-sister Janalyn Shy is [facility] POA Review of an advance directive document . Decision not to resuscitate or to de-escalate disease focused treatments due to poor prognosis.  CODE STATUS: MOST as of 07/04/2021: DNR/DNI Limited additional interventions Antibiotics if indicated IV fluids on a case by case basis No feeding tube    Thank you for the opportunity to participate in the care of Ms. Montagna.  The palliative care team will continue to follow. Please call our office at [phone] if we can be of additional assistance.   Marijo Conception, FNP-C  COVID-19 PATIENT SCREENING TOOL Asked and negative response unless otherwise noted:  Have you had  symptoms of covid, tested positive or been in contact with someone with symptoms/positive test in the past 5-10 days? unknown

## 2021-07-31 DIAGNOSIS — L89154 Pressure ulcer of sacral region, stage 4: Secondary | ICD-10-CM | POA: Diagnosis not present

## 2021-07-31 DIAGNOSIS — L89893 Pressure ulcer of other site, stage 3: Secondary | ICD-10-CM | POA: Diagnosis not present

## 2021-08-04 ENCOUNTER — Other Ambulatory Visit: Payer: Self-pay | Admitting: *Deleted

## 2021-08-04 DIAGNOSIS — I4891 Unspecified atrial fibrillation: Secondary | ICD-10-CM | POA: Diagnosis not present

## 2021-08-04 DIAGNOSIS — I10 Essential (primary) hypertension: Secondary | ICD-10-CM | POA: Diagnosis not present

## 2021-08-04 DIAGNOSIS — Z7901 Long term (current) use of anticoagulants: Secondary | ICD-10-CM | POA: Diagnosis not present

## 2021-08-04 DIAGNOSIS — J45909 Unspecified asthma, uncomplicated: Secondary | ICD-10-CM | POA: Diagnosis not present

## 2021-08-04 DIAGNOSIS — I5020 Unspecified systolic (congestive) heart failure: Secondary | ICD-10-CM | POA: Diagnosis not present

## 2021-08-04 NOTE — Patient Outreach (Signed)
[facility] Post- Acute Care Coordinator follow up. Per [place] eligible member resides in  [facility].    Communication sent to facility SW to inquire about transition plans.  Will continue to follow.     Marthenia Rolling, MSN, RN,BSN [place] Acute Care Coordinator [phone] [facility]) [phone]  (Toll free office)  B

## 2021-08-05 ENCOUNTER — Inpatient Hospital Stay (HOSPITAL_COMMUNITY)
Admission: EM | Admit: 2021-08-05 | Discharge: 2021-08-14 | DRG: 193 | Disposition: A | Discharge status: Skilled Nursing Facility | Payer: Medicare Other | Attending: Family Medicine | Admitting: Family Medicine

## 2021-08-05 ENCOUNTER — Other Ambulatory Visit: Payer: Self-pay

## 2021-08-05 ENCOUNTER — Emergency Department (HOSPITAL_COMMUNITY): Payer: Medicare Other

## 2021-08-05 ENCOUNTER — Encounter (HOSPITAL_COMMUNITY): Payer: Self-pay

## 2021-08-05 DIAGNOSIS — G9341 Metabolic encephalopathy: Secondary | ICD-10-CM | POA: Diagnosis present

## 2021-08-05 DIAGNOSIS — Z8261 Family history of arthritis: Secondary | ICD-10-CM

## 2021-08-05 DIAGNOSIS — M40204 Unspecified kyphosis, thoracic region: Secondary | ICD-10-CM | POA: Diagnosis present

## 2021-08-05 DIAGNOSIS — E785 Hyperlipidemia, unspecified: Secondary | ICD-10-CM | POA: Diagnosis present

## 2021-08-05 DIAGNOSIS — Y95 Nosocomial condition: Secondary | ICD-10-CM | POA: Diagnosis present

## 2021-08-05 DIAGNOSIS — R001 Bradycardia, unspecified: Secondary | ICD-10-CM | POA: Diagnosis not present

## 2021-08-05 DIAGNOSIS — I959 Hypotension, unspecified: Secondary | ICD-10-CM | POA: Diagnosis not present

## 2021-08-05 DIAGNOSIS — Z803 Family history of malignant neoplasm of breast: Secondary | ICD-10-CM

## 2021-08-05 DIAGNOSIS — R846 Abnormal cytological findings in specimens from respiratory organs and thorax: Secondary | ICD-10-CM | POA: Diagnosis not present

## 2021-08-05 DIAGNOSIS — J9622 Acute and chronic respiratory failure with hypercapnia: Secondary | ICD-10-CM | POA: Diagnosis not present

## 2021-08-05 DIAGNOSIS — J9612 Chronic respiratory failure with hypercapnia: Secondary | ICD-10-CM | POA: Diagnosis not present

## 2021-08-05 DIAGNOSIS — J90 Pleural effusion, not elsewhere classified: Secondary | ICD-10-CM

## 2021-08-05 DIAGNOSIS — J918 Pleural effusion in other conditions classified elsewhere: Secondary | ICD-10-CM | POA: Diagnosis present

## 2021-08-05 DIAGNOSIS — R41841 Cognitive communication deficit: Secondary | ICD-10-CM | POA: Diagnosis not present

## 2021-08-05 DIAGNOSIS — Z823 Family history of stroke: Secondary | ICD-10-CM

## 2021-08-05 DIAGNOSIS — I10 Essential (primary) hypertension: Secondary | ICD-10-CM | POA: Diagnosis not present

## 2021-08-05 DIAGNOSIS — J9601 Acute respiratory failure with hypoxia: Secondary | ICD-10-CM

## 2021-08-05 DIAGNOSIS — Z9981 Dependence on supplemental oxygen: Secondary | ICD-10-CM

## 2021-08-05 DIAGNOSIS — M6281 Muscle weakness (generalized): Secondary | ICD-10-CM | POA: Diagnosis not present

## 2021-08-05 DIAGNOSIS — B964 Proteus (mirabilis) (morganii) as the cause of diseases classified elsewhere: Secondary | ICD-10-CM | POA: Diagnosis present

## 2021-08-05 DIAGNOSIS — Z833 Family history of diabetes mellitus: Secondary | ICD-10-CM

## 2021-08-05 DIAGNOSIS — J45909 Unspecified asthma, uncomplicated: Secondary | ICD-10-CM | POA: Diagnosis present

## 2021-08-05 DIAGNOSIS — E669 Obesity, unspecified: Secondary | ICD-10-CM | POA: Diagnosis present

## 2021-08-05 DIAGNOSIS — Z7189 Other specified counseling: Secondary | ICD-10-CM | POA: Diagnosis not present

## 2021-08-05 DIAGNOSIS — Z87891 Personal history of nicotine dependence: Secondary | ICD-10-CM

## 2021-08-05 DIAGNOSIS — Z6839 Body mass index (BMI) 39.0-39.9, adult: Secondary | ICD-10-CM

## 2021-08-05 DIAGNOSIS — L89154 Pressure ulcer of sacral region, stage 4: Secondary | ICD-10-CM | POA: Diagnosis present

## 2021-08-05 DIAGNOSIS — Z888 Allergy status to other drugs, medicaments and biological substances status: Secondary | ICD-10-CM

## 2021-08-05 DIAGNOSIS — R293 Abnormal posture: Secondary | ICD-10-CM | POA: Diagnosis not present

## 2021-08-05 DIAGNOSIS — I517 Cardiomegaly: Secondary | ICD-10-CM | POA: Diagnosis not present

## 2021-08-05 DIAGNOSIS — R4182 Altered mental status, unspecified: Secondary | ICD-10-CM | POA: Diagnosis not present

## 2021-08-05 DIAGNOSIS — Z66 Do not resuscitate: Secondary | ICD-10-CM | POA: Diagnosis present

## 2021-08-05 DIAGNOSIS — Z91048 Other nonmedicinal substance allergy status: Secondary | ICD-10-CM

## 2021-08-05 DIAGNOSIS — N390 Urinary tract infection, site not specified: Secondary | ICD-10-CM | POA: Diagnosis present

## 2021-08-05 DIAGNOSIS — Z90710 Acquired absence of both cervix and uterus: Secondary | ICD-10-CM

## 2021-08-05 DIAGNOSIS — I4891 Unspecified atrial fibrillation: Secondary | ICD-10-CM | POA: Diagnosis not present

## 2021-08-05 DIAGNOSIS — I4821 Permanent atrial fibrillation: Secondary | ICD-10-CM | POA: Diagnosis present

## 2021-08-05 DIAGNOSIS — C3490 Malignant neoplasm of unspecified part of unspecified bronchus or lung: Secondary | ICD-10-CM | POA: Diagnosis not present

## 2021-08-05 DIAGNOSIS — R278 Other lack of coordination: Secondary | ICD-10-CM | POA: Diagnosis not present

## 2021-08-05 DIAGNOSIS — R791 Abnormal coagulation profile: Secondary | ICD-10-CM | POA: Diagnosis present

## 2021-08-05 DIAGNOSIS — Z8249 Family history of ischemic heart disease and other diseases of the circulatory system: Secondary | ICD-10-CM

## 2021-08-05 DIAGNOSIS — M1990 Unspecified osteoarthritis, unspecified site: Secondary | ICD-10-CM | POA: Diagnosis present

## 2021-08-05 DIAGNOSIS — Z886 Allergy status to analgesic agent status: Secondary | ICD-10-CM

## 2021-08-05 DIAGNOSIS — J9811 Atelectasis: Secondary | ICD-10-CM | POA: Diagnosis not present

## 2021-08-05 DIAGNOSIS — R404 Transient alteration of awareness: Secondary | ICD-10-CM | POA: Diagnosis not present

## 2021-08-05 DIAGNOSIS — E119 Type 2 diabetes mellitus without complications: Secondary | ICD-10-CM | POA: Diagnosis present

## 2021-08-05 DIAGNOSIS — Z7951 Long term (current) use of inhaled steroids: Secondary | ICD-10-CM

## 2021-08-05 DIAGNOSIS — R1312 Dysphagia, oropharyngeal phase: Secondary | ICD-10-CM | POA: Diagnosis not present

## 2021-08-05 DIAGNOSIS — Z8701 Personal history of pneumonia (recurrent): Secondary | ICD-10-CM

## 2021-08-05 DIAGNOSIS — R1110 Vomiting, unspecified: Secondary | ICD-10-CM | POA: Diagnosis present

## 2021-08-05 DIAGNOSIS — I482 Chronic atrial fibrillation, unspecified: Secondary | ICD-10-CM | POA: Diagnosis not present

## 2021-08-05 DIAGNOSIS — Z7401 Bed confinement status: Secondary | ICD-10-CM | POA: Diagnosis not present

## 2021-08-05 DIAGNOSIS — R2681 Unsteadiness on feet: Secondary | ICD-10-CM | POA: Diagnosis not present

## 2021-08-05 DIAGNOSIS — Z515 Encounter for palliative care: Secondary | ICD-10-CM | POA: Diagnosis not present

## 2021-08-05 DIAGNOSIS — I700 Atherosclerosis of aorta: Secondary | ICD-10-CM | POA: Diagnosis not present

## 2021-08-05 DIAGNOSIS — Z885 Allergy status to narcotic agent status: Secondary | ICD-10-CM

## 2021-08-05 DIAGNOSIS — Z20822 Contact with and (suspected) exposure to covid-19: Secondary | ICD-10-CM | POA: Diagnosis present

## 2021-08-05 DIAGNOSIS — L8990 Pressure ulcer of unspecified site, unspecified stage: Secondary | ICD-10-CM | POA: Diagnosis present

## 2021-08-05 DIAGNOSIS — Z48813 Encounter for surgical aftercare following surgery on the respiratory system: Secondary | ICD-10-CM | POA: Diagnosis not present

## 2021-08-05 DIAGNOSIS — R9431 Abnormal electrocardiogram [ECG] [EKG]: Secondary | ICD-10-CM | POA: Diagnosis present

## 2021-08-05 DIAGNOSIS — Z7901 Long term (current) use of anticoagulants: Secondary | ICD-10-CM

## 2021-08-05 DIAGNOSIS — Z7984 Long term (current) use of oral hypoglycemic drugs: Secondary | ICD-10-CM

## 2021-08-05 DIAGNOSIS — J189 Pneumonia, unspecified organism: Principal | ICD-10-CM | POA: Diagnosis present

## 2021-08-05 DIAGNOSIS — R627 Adult failure to thrive: Secondary | ICD-10-CM | POA: Diagnosis not present

## 2021-08-05 DIAGNOSIS — J9621 Acute and chronic respiratory failure with hypoxia: Secondary | ICD-10-CM | POA: Insufficient documentation

## 2021-08-05 DIAGNOSIS — I119 Hypertensive heart disease without heart failure: Secondary | ICD-10-CM | POA: Diagnosis present

## 2021-08-05 DIAGNOSIS — I5020 Unspecified systolic (congestive) heart failure: Secondary | ICD-10-CM | POA: Diagnosis not present

## 2021-08-05 DIAGNOSIS — Z9889 Other specified postprocedural states: Secondary | ICD-10-CM

## 2021-08-05 DIAGNOSIS — Z79899 Other long term (current) drug therapy: Secondary | ICD-10-CM

## 2021-08-05 DIAGNOSIS — E118 Type 2 diabetes mellitus with unspecified complications: Secondary | ICD-10-CM | POA: Diagnosis not present

## 2021-08-05 LAB — CBC
HCT: 34.5 % — ABNORMAL LOW (ref 36.0–46.0)
Hemoglobin: 10 g/dL — ABNORMAL LOW (ref 12.0–15.0)
MCH: 26.6 pg (ref 26.0–34.0)
MCHC: 29 g/dL — ABNORMAL LOW (ref 30.0–36.0)
MCV: 91.8 fL (ref 80.0–100.0)
Platelets: 439 10*3/uL — ABNORMAL HIGH (ref 150–400)
RBC: 3.76 MIL/uL — ABNORMAL LOW (ref 3.87–5.11)
RDW: 17.2 % — ABNORMAL HIGH (ref 11.5–15.5)
WBC: 10.8 10*3/uL — ABNORMAL HIGH (ref 4.0–10.5)
nRBC: 0 % (ref 0.0–0.2)

## 2021-08-05 LAB — COMPREHENSIVE METABOLIC PANEL
ALT: 18 U/L (ref 0–44)
AST: 14 U/L — ABNORMAL LOW (ref 15–41)
Albumin: 2.4 g/dL — ABNORMAL LOW (ref 3.5–5.0)
Alkaline Phosphatase: 89 U/L (ref 38–126)
Anion gap: 6 (ref 5–15)
BUN: 23 mg/dL (ref 8–23)
CO2: 35 mmol/L — ABNORMAL HIGH (ref 22–32)
Calcium: 8 mg/dL — ABNORMAL LOW (ref 8.9–10.3)
Chloride: 92 mmol/L — ABNORMAL LOW (ref 98–111)
Creatinine, Ser: 0.39 mg/dL — ABNORMAL LOW (ref 0.44–1.00)
GFR, Estimated: >60 mL/min (ref >60)
Glucose, Bld: 122 mg/dL — ABNORMAL HIGH (ref 70–99)
Potassium: 4.8 mmol/L (ref 3.5–5.1)
Sodium: 133 mmol/L — ABNORMAL LOW (ref 135–145)
Total Bilirubin: 0.6 mg/dL (ref 0.3–1.2)
Total Protein: 5.7 g/dL — ABNORMAL LOW (ref 6.5–8.1)

## 2021-08-05 LAB — URINALYSIS, ROUTINE W REFLEX MICROSCOPIC
Bacteria, UA: NONE SEEN
Bilirubin Urine: NEGATIVE
Glucose, UA: NEGATIVE mg/dL
Hgb urine dipstick: NEGATIVE
Ketones, ur: NEGATIVE mg/dL
Nitrite: NEGATIVE
Protein, ur: >=300 mg/dL — AB
Specific Gravity, Urine: 1.026 (ref 1.005–1.030)
pH: 8 (ref 5.0–8.0)

## 2021-08-05 LAB — CBG MONITORING, ED: Glucose-Capillary: 113 mg/dL — ABNORMAL HIGH (ref 70–99)

## 2021-08-05 LAB — DIGOXIN LEVEL: Digoxin Level: 1.2 ng/mL (ref 0.8–2.0)

## 2021-08-05 LAB — PROTIME-INR
INR: 4.5 (ref 0.8–1.2)
Prothrombin Time: 42.6 seconds — ABNORMAL HIGH (ref 11.4–15.2)

## 2021-08-05 LAB — LACTIC ACID, PLASMA: Lactic Acid, Venous: 0.9 mmol/L (ref 0.5–1.9)

## 2021-08-05 NOTE — ED Provider Notes (Signed)
[facility] DEPT Provider Note   CSN: [id number] Arrival date & time: 08/05/21  2112     History  Chief Complaint  Patient presents with   Altered Mental Status    Shreshta Medley is a 88 y.o. female.  The history is provided by the patient and medical records.  Altered Mental Status  Level 5 caveat: Altered mental status  88 year old female with history of hyperlipidemia, hypertension, A-fib on Coumadin, diabetes, presenting to the ED with altered mental status.  Facility reports she has been like this for the past 24 hours.  Recently diagnosed with pneumonia and was started on oral doxycycline, however has not been tolerating antibiotics as she has been vomiting them up.  She has not had any falls or head trauma.  She is only responding to name currently.  Patient also has decubitus ulcer on her buttocks that facility needs evaluated.  She is currently on 2 L supplemental oxygen, has been on this since diagnosis of pneumonia.  Active DNR/DNI at bedside  Home Medications Prior to Admission medications   Medication Sig Start Date End Date Taking? Authorizing Provider  Ascorbic Acid (VITAMIN C) 1000 MG tablet Take 1,000 mg by mouth daily.   Yes [provider]  Cholecalciferol (VITAMIN D3) 50 MCG (2000 UT) TABS Take 2,000 Units by mouth daily.   Yes [provider]  digoxin (LANOXIN) 0.125 MG tablet Take 1 tablet (0.125 mg total) by mouth daily. 07/09/21  Yes Domenic Polite, MD  docusate sodium (COLACE) 100 MG capsule Take 1 capsule (100 mg total) by mouth daily as needed for mild constipation. 03/11/18  Yes Patrecia Pour, MD  fluticasone furoate-vilanterol (BREO ELLIPTA) 100-25 MCG/INH AEPB Inhale 1 puff into the lungs at bedtime.   Yes [provider]  loratadine (CLARITIN) 10 MG tablet Take 10 mg by mouth daily.   Yes [provider]  metFORMIN (GLUCOPHAGE) 500 MG tablet Take 500 mg by mouth 2 (two) times daily with a  meal.  02/06/14  Yes [provider]  methocarbamol (ROBAXIN) 750 MG tablet Take 750 mg by mouth 3 (three) times daily.    Yes [provider]  metoprolol succinate (TOPROL-XL) 100 MG 24 hr tablet Take 1 tablet (100 mg total) by mouth 2 (two) times daily. 07/08/21  Yes Domenic Polite, MD  Multiple Vitamins-Minerals (MULTIVITAMIN WITH MINERALS) tablet Take 1 tablet by mouth 2 (two) times daily after a meal.   Yes [provider]  nutrition supplement, JUVEN, (JUVEN) PACK Take 1 packet by mouth 2 (two) times daily with a meal. 06/27/21  Yes Kc, Maren Beach, MD  omeprazole (PRILOSEC) 20 MG capsule Take 20 mg by mouth daily before breakfast.   Yes [provider]  PROAIR HFA 108 (90 Base) MCG/ACT inhaler Inhale 2 puffs into the lungs every 6 (six) hours as needed for wheezing or shortness of breath.   Yes [provider]  traMADol (ULTRAM) 50 MG tablet Take 1 tablet (50 mg total) by mouth every 6 (six) hours as needed for up to 4 doses for moderate pain (pain). 07/08/21  Yes Domenic Polite, MD  feeding supplement, GLUCERNA SHAKE, (GLUCERNA SHAKE) LIQD Take 237 mLs by mouth 2 (two) times daily between meals.    [provider]      Allergies    Wheat bran, Kiwi extract, Orange fruit [citrus], Phenergan [promethazine], Pineapple, Promethazine hcl, Tomato, Tylenol [acetaminophen], Vistaril [hydroxyzine hcl], Codeine, and Tape    Review of Systems   Review  of Systems  Unable to perform ROS: Mental status change   Physical Exam Updated Vital Signs BP 112/70    Pulse 76    Temp (!) 97.4 F (36.3 C) (Rectal)    Resp (!) 28    SpO2 94%   Physical Exam Vitals and nursing note reviewed.  Constitutional:      Appearance: She is well-developed.     Comments: Moans when name is called but no verbal responses given  HENT:     Head: Normocephalic and atraumatic.  Eyes:     Conjunctiva/sclera: Conjunctivae normal.     Pupils: Pupils are equal, round, and  reactive to light.  Cardiovascular:     Rate and Rhythm: Normal rate and regular rhythm.     Heart sounds: Normal heart sounds.  Pulmonary:     Effort: Pulmonary effort is normal. No respiratory distress.     Breath sounds: Normal breath sounds. No rhonchi.  Abdominal:     General: Bowel sounds are normal.     Palpations: Abdomen is soft.  Genitourinary:    Comments: Stage II decubitus ulcer of buttocks and sacrum, there is mild amount of drainage but no bleeding or skin necrosis Musculoskeletal:        General: Normal range of motion.     Cervical back: Normal range of motion.  Skin:    General: Skin is warm and dry.  Neurological:     Mental Status: She is oriented to person, place, and time.    ED Results / Procedures / Treatments   Labs (all labs ordered are listed, but only abnormal results are displayed) Labs Reviewed  COMPREHENSIVE METABOLIC PANEL - Abnormal; Notable for the following components:      Result Value   Sodium 133 (*)    Chloride 92 (*)    CO2 35 (*)    Glucose, Bld 122 (*)    Creatinine, Ser 0.39 (*)    Calcium 8.0 (*)    Total Protein 5.7 (*)    Albumin 2.4 (*)    AST 14 (*)    All other components within normal limits  CBC - Abnormal; Notable for the following components:   WBC 10.8 (*)    RBC 3.76 (*)    Hemoglobin 10.0 (*)    HCT 34.5 (*)    MCHC 29.0 (*)    RDW 17.2 (*)    Platelets 439 (*)    All other components within normal limits  URINALYSIS, ROUTINE W REFLEX MICROSCOPIC - Abnormal; Notable for the following components:   Color, Urine AMBER (*)    APPearance CLOUDY (*)    Protein, ur >=300 (*)    Leukocytes,Ua SMALL (*)    All other components within normal limits  PROTIME-INR - Abnormal; Notable for the following components:   Prothrombin Time 42.6 (*)    INR 4.5 (*)    All other components within normal limits  CBG MONITORING, ED - Abnormal; Notable for the following components:   Glucose-Capillary 113 (*)    All other  components within normal limits  RESP PANEL BY RT-PCR (FLU A&B, COVID) ARPGX2  URINE CULTURE  CULTURE, BLOOD (ROUTINE X 2)  CULTURE, BLOOD (ROUTINE X 2)  DIGOXIN LEVEL  LACTIC ACID, PLASMA  AMMONIA    EKG None  Radiology DG Chest 2 View  Result Date: 08/05/2021 CLINICAL DATA:  Altered mental status EXAM: CHEST - 2 VIEW COMPARISON:  07/06/2021, 07/02/2021 FINDINGS: Moderate bilateral pleural effusions with mild loculation on the right.  Cardiomegaly. Basilar airspace disease. No visible pneumothorax IMPRESSION: Increased bilateral effusions appear moderate in size and there may be some loculation on the right. Cardiomegaly. Atelectasis or pneumonia at the bases. Electronically Signed   By: Donavan Foil M.D.   On: 08/05/2021 23:02   CT HEAD WO CONTRAST (5MM)  Result Date: 08/05/2021 CLINICAL DATA:  Altered mental status EXAM: CT HEAD WITHOUT CONTRAST TECHNIQUE: Contiguous axial images were obtained from the base of the skull through the vertex without intravenous contrast. RADIATION DOSE REDUCTION: This exam was performed according to the departmental dose-optimization program which includes automated exposure control, adjustment of the mA and/or kV according to patient size and/or use of iterative reconstruction technique. COMPARISON:  CT brain 06/24/2021 FINDINGS: Brain: Limited by positioning, patient is kyphotic and images were obtained in the coronal plane. No gross territorial infarction, hemorrhage or mass. Mild atrophy. The ventricles are nonenlarged. Hypodensity in the white matter probably due to chronic small vessel disease. Vascular: Carotid vascular calcification. Skull: Normal. Negative for fracture or focal lesion. Sinuses/Orbits: No acute finding. Other: None IMPRESSION: 1. Limited by patient positioning and artifact. 2. No gross CT evidence for acute intracranial abnormality Electronically Signed   By: Donavan Foil M.D.   On: 08/05/2021 23:30    Procedures Procedures     CRITICAL CARE Performed by: Larene Pickett   Total critical care time: 45 minutes  Critical care time was exclusive of separately billable procedures and treating other patients.  Critical care was necessary to treat or prevent imminent or life-threatening deterioration.  Critical care was time spent personally by me on the following activities: development of treatment plan with patient and/or surrogate as well as nursing, discussions with consultants, evaluation of patient's response to treatment, examination of patient, obtaining history from patient or surrogate, ordering and performing treatments and interventions, ordering and review of laboratory studies, ordering and review of radiographic studies, pulse oximetry and re-evaluation of patient's condition.   Medications Ordered in ED Medications  vancomycin (VANCOCIN) IVPB 1000 mg/200 mL premix (has no administration in time range)  ceFEPIme (MAXIPIME) 2 g in sodium chloride 0.9 % 100 mL IVPB (2 g Intravenous New Bag/Given 08/06/21 0247)    ED Course/ Medical Decision Making/ A&P                           Medical Decision Making Amount and/or Complexity of Data Reviewed Independent Historian: EMS External Data Reviewed: labs and radiology. Labs: ordered. Radiology: ordered and independent interpretation performed. ECG/medicine tests: ordered and independent interpretation performed.  Risk Prescription drug management. Decision regarding hospitalization.   88 year old female presenting to the ED with altered mental status.  Recently diagnosed with pneumonia at SNF, has failed outpatient doxycycline.  She is afebrile and nontoxic in appearance here but she only responds with moans when addressed on exam.  She is on 2L which has been baseline for her since PNA diagnosis.  She also has large stage II decubitus ulcer of her buttocks and sacrum.  There is small amount of drainage but no tissue necrosis or surrounding  cellulitis.  Patient is also on Coumadin but no recent falls or other trauma per EMS. Will check labs, CT head, CXR, EKG, covid/flu screen.  CT head negative for acute findings.  CXR with possible PNA at the bases.  Labs overall reassuring--mild leukocytosis at 10.8, normal lactate.  Chemistry appears at baseline, no significant electrolyte disturbance.  COVID and flu screen are negative.  Her INR is elevated at 4.5, however again no intracranial findings.  She is still requiring 2 L supplemental oxygen.  Given her altered state and failure of outpatient doxycycline, she will require admission.  Will start Vanco/cefepime as this was SNF acquired.  Discussed with hospitalist, Dr. Alcario Drought-- patient will be admitted for ongoing care.  DNR/DNI at bedside.  Final Clinical Impression(s) / ED Diagnoses Final diagnoses:  Altered mental status, unspecified altered mental status type  Healthcare-associated pneumonia    Rx / DC Orders ED Discharge Orders     None         Larene Pickett, PA-C 08/06/21 0255    Dorie Rank, MD 08/09/21 [phone]

## 2021-08-06 ENCOUNTER — Encounter (HOSPITAL_COMMUNITY): Payer: Self-pay | Admitting: Internal Medicine

## 2021-08-06 ENCOUNTER — Inpatient Hospital Stay (HOSPITAL_COMMUNITY): Payer: Medicare Other

## 2021-08-06 ENCOUNTER — Other Ambulatory Visit: Payer: Self-pay

## 2021-08-06 DIAGNOSIS — J9612 Chronic respiratory failure with hypercapnia: Secondary | ICD-10-CM | POA: Diagnosis not present

## 2021-08-06 DIAGNOSIS — E119 Type 2 diabetes mellitus without complications: Secondary | ICD-10-CM | POA: Diagnosis present

## 2021-08-06 DIAGNOSIS — R4182 Altered mental status, unspecified: Secondary | ICD-10-CM | POA: Diagnosis not present

## 2021-08-06 DIAGNOSIS — J90 Pleural effusion, not elsewhere classified: Secondary | ICD-10-CM | POA: Diagnosis not present

## 2021-08-06 DIAGNOSIS — R293 Abnormal posture: Secondary | ICD-10-CM | POA: Diagnosis not present

## 2021-08-06 DIAGNOSIS — R41841 Cognitive communication deficit: Secondary | ICD-10-CM | POA: Diagnosis not present

## 2021-08-06 DIAGNOSIS — C3490 Malignant neoplasm of unspecified part of unspecified bronchus or lung: Secondary | ICD-10-CM | POA: Diagnosis not present

## 2021-08-06 DIAGNOSIS — I482 Chronic atrial fibrillation, unspecified: Secondary | ICD-10-CM | POA: Diagnosis not present

## 2021-08-06 DIAGNOSIS — J189 Pneumonia, unspecified organism: Principal | ICD-10-CM

## 2021-08-06 DIAGNOSIS — Z48813 Encounter for surgical aftercare following surgery on the respiratory system: Secondary | ICD-10-CM | POA: Diagnosis not present

## 2021-08-06 DIAGNOSIS — Z7901 Long term (current) use of anticoagulants: Secondary | ICD-10-CM | POA: Diagnosis not present

## 2021-08-06 DIAGNOSIS — R9431 Abnormal electrocardiogram [ECG] [EKG]: Secondary | ICD-10-CM | POA: Diagnosis present

## 2021-08-06 DIAGNOSIS — E118 Type 2 diabetes mellitus with unspecified complications: Secondary | ICD-10-CM | POA: Diagnosis not present

## 2021-08-06 DIAGNOSIS — J9622 Acute and chronic respiratory failure with hypercapnia: Secondary | ICD-10-CM | POA: Diagnosis not present

## 2021-08-06 DIAGNOSIS — J9601 Acute respiratory failure with hypoxia: Secondary | ICD-10-CM | POA: Diagnosis not present

## 2021-08-06 DIAGNOSIS — Z20822 Contact with and (suspected) exposure to covid-19: Secondary | ICD-10-CM | POA: Diagnosis present

## 2021-08-06 DIAGNOSIS — J45909 Unspecified asthma, uncomplicated: Secondary | ICD-10-CM | POA: Diagnosis present

## 2021-08-06 DIAGNOSIS — R627 Adult failure to thrive: Secondary | ICD-10-CM | POA: Diagnosis not present

## 2021-08-06 DIAGNOSIS — N390 Urinary tract infection, site not specified: Secondary | ICD-10-CM | POA: Diagnosis present

## 2021-08-06 DIAGNOSIS — Z515 Encounter for palliative care: Secondary | ICD-10-CM | POA: Diagnosis not present

## 2021-08-06 DIAGNOSIS — G9341 Metabolic encephalopathy: Secondary | ICD-10-CM | POA: Diagnosis present

## 2021-08-06 DIAGNOSIS — I119 Hypertensive heart disease without heart failure: Secondary | ICD-10-CM | POA: Diagnosis present

## 2021-08-06 DIAGNOSIS — Z7189 Other specified counseling: Secondary | ICD-10-CM | POA: Diagnosis not present

## 2021-08-06 DIAGNOSIS — M6281 Muscle weakness (generalized): Secondary | ICD-10-CM | POA: Diagnosis not present

## 2021-08-06 DIAGNOSIS — J918 Pleural effusion in other conditions classified elsewhere: Secondary | ICD-10-CM | POA: Diagnosis present

## 2021-08-06 DIAGNOSIS — M1990 Unspecified osteoarthritis, unspecified site: Secondary | ICD-10-CM | POA: Diagnosis present

## 2021-08-06 DIAGNOSIS — I4821 Permanent atrial fibrillation: Secondary | ICD-10-CM | POA: Diagnosis present

## 2021-08-06 DIAGNOSIS — R1110 Vomiting, unspecified: Secondary | ICD-10-CM | POA: Diagnosis present

## 2021-08-06 DIAGNOSIS — J9621 Acute and chronic respiratory failure with hypoxia: Secondary | ICD-10-CM | POA: Diagnosis not present

## 2021-08-06 DIAGNOSIS — R846 Abnormal cytological findings in specimens from respiratory organs and thorax: Secondary | ICD-10-CM | POA: Diagnosis not present

## 2021-08-06 DIAGNOSIS — R1312 Dysphagia, oropharyngeal phase: Secondary | ICD-10-CM | POA: Diagnosis not present

## 2021-08-06 DIAGNOSIS — I700 Atherosclerosis of aorta: Secondary | ICD-10-CM | POA: Diagnosis not present

## 2021-08-06 DIAGNOSIS — Z7401 Bed confinement status: Secondary | ICD-10-CM | POA: Diagnosis not present

## 2021-08-06 DIAGNOSIS — M40204 Unspecified kyphosis, thoracic region: Secondary | ICD-10-CM | POA: Diagnosis present

## 2021-08-06 DIAGNOSIS — Z66 Do not resuscitate: Secondary | ICD-10-CM | POA: Diagnosis present

## 2021-08-06 DIAGNOSIS — L89154 Pressure ulcer of sacral region, stage 4: Secondary | ICD-10-CM | POA: Diagnosis present

## 2021-08-06 DIAGNOSIS — I10 Essential (primary) hypertension: Secondary | ICD-10-CM | POA: Diagnosis not present

## 2021-08-06 DIAGNOSIS — R2681 Unsteadiness on feet: Secondary | ICD-10-CM | POA: Diagnosis not present

## 2021-08-06 DIAGNOSIS — B964 Proteus (mirabilis) (morganii) as the cause of diseases classified elsewhere: Secondary | ICD-10-CM | POA: Diagnosis present

## 2021-08-06 DIAGNOSIS — R791 Abnormal coagulation profile: Secondary | ICD-10-CM | POA: Diagnosis present

## 2021-08-06 DIAGNOSIS — J9811 Atelectasis: Secondary | ICD-10-CM | POA: Diagnosis not present

## 2021-08-06 DIAGNOSIS — R278 Other lack of coordination: Secondary | ICD-10-CM | POA: Diagnosis not present

## 2021-08-06 DIAGNOSIS — E669 Obesity, unspecified: Secondary | ICD-10-CM | POA: Diagnosis present

## 2021-08-06 DIAGNOSIS — E785 Hyperlipidemia, unspecified: Secondary | ICD-10-CM | POA: Diagnosis present

## 2021-08-06 DIAGNOSIS — Y95 Nosocomial condition: Secondary | ICD-10-CM | POA: Diagnosis present

## 2021-08-06 DIAGNOSIS — Z886 Allergy status to analgesic agent status: Secondary | ICD-10-CM | POA: Diagnosis not present

## 2021-08-06 LAB — CBC
HCT: 38.3 % (ref 36.0–46.0)
Hemoglobin: 11.1 g/dL — ABNORMAL LOW (ref 12.0–15.0)
MCH: 26.9 pg (ref 26.0–34.0)
MCHC: 29 g/dL — ABNORMAL LOW (ref 30.0–36.0)
MCV: 93 fL (ref 80.0–100.0)
Platelets: 450 10*3/uL — ABNORMAL HIGH (ref 150–400)
RBC: 4.12 MIL/uL (ref 3.87–5.11)
RDW: 17.2 % — ABNORMAL HIGH (ref 11.5–15.5)
WBC: 22 10*3/uL — ABNORMAL HIGH (ref 4.0–10.5)
nRBC: 0 % (ref 0.0–0.2)

## 2021-08-06 LAB — BASIC METABOLIC PANEL
Anion gap: 9 (ref 5–15)
BUN: 27 mg/dL — ABNORMAL HIGH (ref 8–23)
CO2: 33 mmol/L — ABNORMAL HIGH (ref 22–32)
Calcium: 8 mg/dL — ABNORMAL LOW (ref 8.9–10.3)
Chloride: 93 mmol/L — ABNORMAL LOW (ref 98–111)
Creatinine, Ser: 0.52 mg/dL (ref 0.44–1.00)
GFR, Estimated: >60 mL/min (ref >60)
Glucose, Bld: 142 mg/dL — ABNORMAL HIGH (ref 70–99)
Potassium: 4.7 mmol/L (ref 3.5–5.1)
Sodium: 135 mmol/L (ref 135–145)

## 2021-08-06 LAB — PROTIME-INR
INR: 3.8 — ABNORMAL HIGH (ref 0.8–1.2)
Prothrombin Time: 37.1 seconds — ABNORMAL HIGH (ref 11.4–15.2)

## 2021-08-06 LAB — DIGOXIN LEVEL: Digoxin Level: 0.8 ng/mL (ref 0.8–2.0)

## 2021-08-06 LAB — RESP PANEL BY RT-PCR (FLU A&B, COVID) ARPGX2
Influenza A by PCR: NEGATIVE
Influenza B by PCR: NEGATIVE
SARS Coronavirus 2 by RT PCR: NEGATIVE

## 2021-08-06 LAB — GLUCOSE, CAPILLARY
Glucose-Capillary: 145 mg/dL — ABNORMAL HIGH (ref 70–99)
Glucose-Capillary: 178 mg/dL — ABNORMAL HIGH (ref 70–99)

## 2021-08-06 MED ORDER — LORATADINE 10 MG PO TABS
10.0000 mg | ORAL_TABLET | Freq: Every day | ORAL | Status: DC
  Administered 2021-08-06 – 2021-08-14 (×9): 10 mg via ORAL
  Filled 2021-08-06 (×9): qty 1

## 2021-08-06 MED ORDER — ADULT MULTIVITAMIN W/MINERALS CH
1.0000 | ORAL_TABLET | Freq: Two times a day (BID) | ORAL | Status: DC
  Administered 2021-08-06 – 2021-08-14 (×17): 1 via ORAL
  Filled 2021-08-06 (×17): qty 1

## 2021-08-06 MED ORDER — DIGOXIN 125 MCG PO TABS
0.1250 mg | ORAL_TABLET | Freq: Every day | ORAL | Status: DC
  Administered 2021-08-06 – 2021-08-14 (×9): 0.125 mg via ORAL
  Filled 2021-08-06 (×9): qty 1

## 2021-08-06 MED ORDER — VANCOMYCIN HCL 1500 MG/300ML IV SOLN
1500.0000 mg | INTRAVENOUS | Status: DC
  Administered 2021-08-07 – 2021-08-08 (×2): 1500 mg via INTRAVENOUS
  Filled 2021-08-06 (×2): qty 300

## 2021-08-06 MED ORDER — METOPROLOL SUCCINATE ER 100 MG PO TB24
100.0000 mg | ORAL_TABLET | Freq: Two times a day (BID) | ORAL | Status: DC
  Administered 2021-08-06 – 2021-08-14 (×12): 100 mg via ORAL
  Filled 2021-08-06: qty 2
  Filled 2021-08-06 (×14): qty 1

## 2021-08-06 MED ORDER — VANCOMYCIN HCL IN DEXTROSE 1-5 GM/200ML-% IV SOLN
1000.0000 mg | Freq: Once | INTRAVENOUS | Status: AC
  Administered 2021-08-06: 1000 mg via INTRAVENOUS
  Filled 2021-08-06: qty 200

## 2021-08-06 MED ORDER — ONDANSETRON HCL 4 MG PO TABS: 4.0000 mg | ORAL_TABLET | Freq: Four times a day (QID) | ORAL | Status: DC | PRN

## 2021-08-06 MED ORDER — METHYLPREDNISOLONE SODIUM SUCC 40 MG IJ SOLR
40.0000 mg | Freq: Once | INTRAMUSCULAR | Status: AC
  Administered 2021-08-06: 40 mg via INTRAVENOUS
  Filled 2021-08-06: qty 1

## 2021-08-06 MED ORDER — ONDANSETRON HCL 4 MG/2ML IJ SOLN: 4.0000 mg | Freq: Four times a day (QID) | INTRAMUSCULAR | Status: DC | PRN

## 2021-08-06 MED ORDER — ACETAMINOPHEN 325 MG PO TABS: 650.0000 mg | ORAL_TABLET | Freq: Four times a day (QID) | ORAL | Status: DC | PRN

## 2021-08-06 MED ORDER — ALBUTEROL SULFATE (2.5 MG/3ML) 0.083% IN NEBU: 2.5000 mg | INHALATION_SOLUTION | RESPIRATORY_TRACT | Status: DC | PRN

## 2021-08-06 MED ORDER — METFORMIN HCL 500 MG PO TABS
500.0000 mg | ORAL_TABLET | Freq: Two times a day (BID) | ORAL | Status: DC
  Administered 2021-08-06 – 2021-08-14 (×17): 500 mg via ORAL
  Filled 2021-08-06 (×17): qty 1

## 2021-08-06 MED ORDER — PANTOPRAZOLE SODIUM 40 MG PO TBEC
40.0000 mg | DELAYED_RELEASE_TABLET | Freq: Every day | ORAL | Status: DC
  Administered 2021-08-06 – 2021-08-14 (×9): 40 mg via ORAL
  Filled 2021-08-06 (×9): qty 1

## 2021-08-06 MED ORDER — FLUTICASONE FUROATE-VILANTEROL 100-25 MCG/ACT IN AEPB
1.0000 | INHALATION_SPRAY | Freq: Every day | RESPIRATORY_TRACT | Status: DC
  Administered 2021-08-06 – 2021-08-12 (×4): 1 via RESPIRATORY_TRACT
  Filled 2021-08-06: qty 28

## 2021-08-06 MED ORDER — ORAL CARE MOUTH RINSE
15.0000 mL | Freq: Two times a day (BID) | OROMUCOSAL | Status: DC
  Administered 2021-08-06 – 2021-08-14 (×15): 15 mL via OROMUCOSAL

## 2021-08-06 MED ORDER — ACETAMINOPHEN 650 MG RE SUPP: 650.0000 mg | Freq: Four times a day (QID) | RECTAL | Status: DC | PRN

## 2021-08-06 MED ORDER — SODIUM CHLORIDE 0.9 % IV SOLN
2.0000 g | Freq: Two times a day (BID) | INTRAVENOUS | Status: DC
  Administered 2021-08-06 – 2021-08-10 (×8): 2 g via INTRAVENOUS
  Filled 2021-08-06 (×8): qty 2

## 2021-08-06 MED ORDER — IPRATROPIUM BROMIDE 0.02 % IN SOLN: 0.5000 mg | RESPIRATORY_TRACT | Status: DC | PRN

## 2021-08-06 MED ORDER — DOCUSATE SODIUM 100 MG PO CAPS: 100.0000 mg | ORAL_CAPSULE | Freq: Every day | ORAL | Status: DC | PRN

## 2021-08-06 MED ORDER — JUVEN PO PACK
1.0000 | PACK | Freq: Two times a day (BID) | ORAL | Status: DC
  Administered 2021-08-06 – 2021-08-14 (×13): 1 via ORAL
  Filled 2021-08-06 (×19): qty 1

## 2021-08-06 MED ORDER — ASCORBIC ACID 500 MG PO TABS
1000.0000 mg | ORAL_TABLET | Freq: Every day | ORAL | Status: DC
  Administered 2021-08-06 – 2021-08-14 (×9): 1000 mg via ORAL
  Filled 2021-08-06 (×9): qty 2

## 2021-08-06 MED ORDER — GLUCERNA SHAKE PO LIQD
237.0000 mL | Freq: Two times a day (BID) | ORAL | Status: DC
  Administered 2021-08-07 – 2021-08-14 (×7): 237 mL via ORAL
  Filled 2021-08-06 (×18): qty 237

## 2021-08-06 MED ORDER — INSULIN ASPART 100 UNIT/ML IJ SOLN
0.0000 [IU] | Freq: Three times a day (TID) | INTRAMUSCULAR | Status: DC
  Administered 2021-08-06: 1 [IU] via SUBCUTANEOUS
  Administered 2021-08-07 – 2021-08-08 (×2): 2 [IU] via SUBCUTANEOUS
  Administered 2021-08-09 – 2021-08-14 (×6): 1 [IU] via SUBCUTANEOUS
  Filled 2021-08-06: qty 0.09

## 2021-08-06 MED ORDER — IOHEXOL 300 MG/ML  SOLN
75.0000 mL | Freq: Once | INTRAMUSCULAR | Status: AC | PRN
  Administered 2021-08-06: 75 mL via INTRAVENOUS

## 2021-08-06 MED ORDER — MAGNESIUM SULFATE 2 GM/50ML IV SOLN
2.0000 g | Freq: Once | INTRAVENOUS | Status: AC
  Administered 2021-08-06: 2 g via INTRAVENOUS
  Filled 2021-08-06: qty 50

## 2021-08-06 MED ORDER — VITAMIN D 25 MCG (1000 UNIT) PO TABS
2000.0000 [IU] | ORAL_TABLET | Freq: Every day | ORAL | Status: DC
  Administered 2021-08-06 – 2021-08-14 (×9): 2000 [IU] via ORAL
  Filled 2021-08-06 (×9): qty 2

## 2021-08-06 MED ORDER — SODIUM CHLORIDE 0.9 % IV SOLN
2.0000 g | Freq: Once | INTRAVENOUS | Status: AC
  Administered 2021-08-06: 2 g via INTRAVENOUS
  Filled 2021-08-06: qty 2

## 2021-08-06 NOTE — ED Triage Notes (Signed)
Pt. BIB [facility] c/o AMS X1 day. EMS states that pt. Was recently treated for pneumonia, but unable to take oral antibiotics due to vomiting. Pt. Only responsive to pain when BIB EMS.

## 2021-08-06 NOTE — Progress Notes (Signed)
Pharmacy Antibiotic Note  Casey Patel is a 88 y.o. female admitted on 08/05/2021 with history of hyperlipidemia, hypertension, A-fib on Coumadin, diabetes, presenting to the ED with altered mental status.  Facility reports she has been like this for the past 24 hours.  Recently diagnosed with pneumonia and was started on oral doxycycline, however has not been tolerating antibiotics as she has been vomiting them up.  Pharmacy has been consulted for vancomycin and cefepime dosing.  Plan: Vancomycin 1gm IV x 1 then 1500 q36h (AUC 519.2, Scr used 1.0) Cefepime 2gm IV q12h Follow renal function and clinical course     Temp (24hrs), Avg:97.4 F (36.3 C), Min:97.4 F (36.3 C), Max:97.4 F (36.3 C)  Recent Labs  Lab 08/05/21 2140 08/05/21 2223  WBC 10.8*  --   CREATININE 0.39*  --   LATICACIDVEN  --  0.9    Estimated Creatinine Clearance: 50.4 mL/min (A) (by C-G formula based on SCr of 0.39 mg/dL (L)).    Allergies  Allergen Reactions   Wheat Bran Shortness Of Breath   Kiwi Extract Other (See Comments)    Causes mouth sores   Orange Fruit [Citrus] Other (See Comments)    Causes mouth sores and "Allergic," per [facility]   Phenergan [Promethazine] Other (See Comments)    Disorientation and "Allergic," per [facility]    Pineapple Other (See Comments)    Causes mouth sores    Promethazine Hcl Other (See Comments)    Disorientation and "Allergic," per [facility]    Tomato Other (See Comments)    Causes mouth sores   Tylenol [Acetaminophen] Other (See Comments)    Causes fast heart rate and "Allergic," per [facility]   Vistaril [Hydroxyzine Hcl] Other (See Comments)    Confusion and "Allergic," per [facility]    Codeine Itching, Rash and Other (See Comments)    "Allergic," per [facility]   Tape Rash and Other (See Comments)    Blisters on skin- "Allergic," per [facility]    Antimicrobials this admission: 2/8 vanc >> 2/8 cefepime >>  Dose adjustments this admission:   Microbiology results: 2/7 BCx:  2/7  UCx:  Thank you for allowing pharmacy to be a part of this patients care.  Dolly Rias RPh 08/06/2021, 4:58 AM

## 2021-08-06 NOTE — Progress Notes (Signed)
[facility]) Hospital Liaison note:  This patient is currently enrolled in [facility] outpatient-based Palliative Care. Will continue to follow for disposition.  Please call with any outpatient palliative questions or concerns.  Thank you, Lorelee Market, LPN [facility] Liaison [phone]

## 2021-08-06 NOTE — Progress Notes (Signed)
ANTICOAGULATION CONSULT NOTE -  Pharmacy Consult for warfarin Indication: hx atrial fibrillation  Allergies  Allergen Reactions   Wheat Bran Shortness Of Breath   Kiwi Extract Other (See Comments)    Causes mouth sores   Orange Fruit [Citrus] Other (See Comments)    Causes mouth sores and "Allergic," per [facility]   Phenergan [Promethazine] Other (See Comments)    Disorientation and "Allergic," per [facility]    Pineapple Other (See Comments)    Causes mouth sores    Promethazine Hcl Other (See Comments)    Disorientation and "Allergic," per [facility]    Tomato Other (See Comments)    Causes mouth sores   Tylenol [Acetaminophen] Other (See Comments)    Causes fast heart rate and "Allergic," per [facility]   Vistaril [Hydroxyzine Hcl] Other (See Comments)    Confusion and "Allergic," per [facility]    Codeine Itching, Rash and Other (See Comments)    "Allergic," per [facility]   Tape Rash and Other (See Comments)    Blisters on skin- "Allergic," per [facility]    Patient Measurements:   Heparin Dosing Weight:   Vital Signs: BP: 96/54 (02/08 1100) Pulse Rate: 75 (02/08 1100)  Labs: Recent Labs    08/05/21 2140 08/05/21 2227  HGB 10.0*  --   HCT 34.5*  --   PLT 439*  --   LABPROT  --  42.6*  INR  --  4.5*  CREATININE 0.39*  --     Estimated Creatinine Clearance: 50.4 mL/min (A) (by C-G formula based on SCr of 0.39 mg/dL (L)).   Medications:  - PTA warfarin regimen: per [facility] from [place], pt was on 2.5mg  four days a week on Mon, Tue, Thurs, Sat --> then on 08/04/21 this was changed to 2.5 mg on Sundays only (only one time a week)  Assessment: Patient is an 88 y.o F with hx afib on warfarin PTA, presented to the ED on 08/05/21 with AMS.  Pharmacy has been consulted to dose warfarin.  - INR was elevated on admission (2/7) at  4.5, but pt was on doxycycline for wound and levaquin for PNA  Today, 08/06/2021: - INR remains supra-therapeutic at 3.8 - cbc stable - no bleeding documented - drug-drug intxns:  being on abx (currently on cefepime and vancomycin) can make pt more sensitive to warfarin  Goal of Therapy:  INR 2-3 Monitor platelets by anticoagulation protocol: Yes   Plan:  - hold warfarin today since INR >3 - daily INR - monitor for s/sx bleeding   Dia Sitter P 08/06/2021,11:33 AM

## 2021-08-06 NOTE — H&P (Signed)
History and Physical    Patient: Casey Patel VEH:[id number] DOB: Dec 26, 1929 DOA: 08/05/2021 DOS: the patient was seen and examined on 08/06/2021 PCP: Tisovec, Fransico Him, MD  Patient coming from: SNF  Chief Complaint:  Chief Complaint  Patient presents with   Altered Mental Status   HPI: Casey Patel is a 88 y.o. female with medical history significant of chronic atrial fibrillation on warfarin, osteoarthritis, asthma, type II DM, hyperlipidemia, hypertension, history of pneumonia who is coming to the emergency department from her skilled nursing facility due to decreased mentation for the past day and several episodes of emesis.  Her sister stated that she was being treated for pneumonia for the past 2 days at the facility and was improving until she started having emesis yesterday.  When EMS initially evaluated her, she was only responsive to pain.  She is now somnolent, but answers simple questions.  No dyspnea, no sore throat, chest or abdominal pain or nausea at this time.  Unsure if the patient has had a fever or not.  ED: Initial vital signs were temperature 97.4 F, pulse 93, respiration 18, BP 110/67 and O2 sat 97% on 2 LPM of oxygen via nasal cannula.  The patient received cefepime and vancomycin.  I added Solu-Medrol 40 mg IVP x1.  Labwork: Her urinalysis was cloudy with small leukocyte esterase and proteinuria, but was otherwise unremarkable.  CBC showed her white count 10.8, hemoglobin 10.0 g/dL platelets 439.  CMP showed a sodium of 133, chloride 92 CO2 35 mmol/L.  Glucose 122, unremarkable BUN, creatinine and corrected calcium level.  Total protein was 5.7 and albumin 2.4 g/dL.  The rest of the LFTs were normal.  Imaging: Bilateral pleural effusion moderate size.  Questionable loculation on the right.  There is cardiomegaly.  Atelectasis or pneumonia at the bases.  CT head without contrast was limited due to positioning and artifact.  However there was no gross CT evidence for acute  intracranial abnormality.  Review of Systems: As mentioned in the history of present illness. All other systems reviewed and are negative. Past Medical History:  Diagnosis Date   AF (atrial fibrillation) (HCC)    Arthritis    Asthma    Diabetes mellitus    TYPE 2   Hyperlipidemia    Hypertension    PNA (pneumonia)    Past Surgical History:  Procedure Laterality Date   ABDOMINAL HYSTERECTOMY  1976   BACK SURGERY     lumb x 3   CARDIOVERSION  12/07/2011   Procedure: CARDIOVERSION;  Surgeon: Peter M Martinique, MD;  Location: [place];  Service: Cardiovascular;  Laterality: N/A;   CARPAL TUNNEL RELEASE  09/15/2011   Procedure: CARPAL TUNNEL RELEASE;  Surgeon: Wynonia Sours, MD;  Location: [place];  Service: Orthopedics;  Laterality: Right;   CATARACT EXTRACTION     CHOLECYSTECTOMY  2003   HAND TENDON SURGERY  1999   left wrist - thumb   LAMINECTOMY  1999   with fusion (L1-5)   LAMINECTOMY  19995   with fusion (L3-4)   TONSILLECTOMY  1952   Social History:  reports that she quit smoking about 43 years ago. Her smoking use included cigarettes. She has a 16.00 pack-year smoking history. She has never used smokeless tobacco. She reports that she does not drink alcohol and does not use drugs.  Allergies  Allergen Reactions   Wheat Bran Shortness Of Breath   Kiwi Extract Other (See Comments)    Causes mouth sores  Orange Fruit [Citrus] Other (See Comments)    Causes mouth sores and "Allergic," per [facility]   Phenergan [Promethazine] Other (See Comments)    Disorientation and "Allergic," per [facility]    Pineapple Other (See Comments)    Causes mouth sores    Promethazine Hcl Other (See Comments)    Disorientation and "Allergic," per [facility]    Tomato Other (See Comments)    Causes mouth sores   Tylenol [Acetaminophen] Other (See Comments)    Causes fast heart rate and "Allergic," per [facility]   Vistaril [Hydroxyzine Hcl] Other (See Comments)    Confusion and "Allergic," per [facility]     Codeine Itching, Rash and Other (See Comments)    "Allergic," per [facility]   Tape Rash and Other (See Comments)    Blisters on skin- "Allergic," per [facility]    Family History  Problem Relation Age of Onset   Hypertension Mother 65   Stroke Mother 72   Arthritis Mother 15   Heart attack Father 22   Heart disease Father 3   Hypertension Father 19   Ulcers Father 82   Breast cancer Sister    Heart disease Brother        CONGENTIAL HEART DISEASE   Heart disease Sister    Diabetes Sister    Breast cancer Sister     Prior to Admission medications   Medication Sig Start Date End Date Taking? Authorizing Provider  Ascorbic Acid (VITAMIN C) 1000 MG tablet Take 1,000 mg by mouth daily.   Yes [provider]  Cholecalciferol (VITAMIN D3) 50 MCG (2000 UT) TABS Take 2,000 Units by mouth daily.   Yes [provider]  digoxin (LANOXIN) 0.125 MG tablet Take 1 tablet (0.125 mg total) by mouth daily. 07/09/21  Yes Domenic Polite, MD  docusate sodium (COLACE) 100 MG capsule Take 1 capsule (100 mg total) by mouth daily as needed for mild constipation. 03/11/18  Yes Patrecia Pour, MD  doxycycline (VIBRA-TABS) 100 MG tablet Take 100 mg by mouth 2 (two) times daily. 07/31/21 08/07/21 Yes [provider]  feeding supplement, GLUCERNA SHAKE, (GLUCERNA SHAKE) LIQD Take 237 mLs by mouth 2 (two) times daily between meals.   Yes [provider]  fluticasone furoate-vilanterol (BREO ELLIPTA) 100-25 MCG/INH AEPB Inhale 1 puff into the lungs at bedtime.   Yes [provider]  levofloxacin (LEVAQUIN) 750 MG tablet Take 750 mg by mouth daily. 08/05/21 08/11/21 Yes [provider]  loratadine (CLARITIN) 10 MG tablet Take 10 mg by mouth daily.   Yes [provider]  metFORMIN (GLUCOPHAGE) 500 MG tablet Take 500 mg by mouth 2 (two) times daily with a meal.  02/06/14  Yes [provider]  methocarbamol (ROBAXIN) 750 MG tablet Take 750 mg by mouth 3 (three) times  daily.    Yes [provider]  metoprolol succinate (TOPROL-XL) 100 MG 24 hr tablet Take 1 tablet (100 mg total) by mouth 2 (two) times daily. 07/08/21  Yes Domenic Polite, MD  Multiple Vitamins-Minerals (MULTIVITAMIN WITH MINERALS) tablet Take 1 tablet by mouth 2 (two) times daily after a meal.   Yes [provider]  nutrition supplement, JUVEN, (JUVEN) PACK Take 1 packet by mouth 2 (two) times daily with a meal. 06/27/21  Yes Kc, Maren Beach, MD  omeprazole (PRILOSEC) 20 MG capsule Take 20 mg by mouth daily before breakfast.   Yes [provider]  OXYGEN Inhale 2 L/min into the lungs continuous.   Yes [provider]  potassium  chloride SA (KLOR-CON M) 20 MEQ tablet Take 20 mEq by mouth daily. 08/05/21 08/07/21 Yes [provider]  PROAIR DIGIHALER 108 (90 Base) MCG/ACT AEPB Inhale 2 puffs into the lungs every 6 (six) hours as needed ("for up to 4 doses for moderate pain").   Yes [provider]  traMADol (ULTRAM) 50 MG tablet Take 1 tablet (50 mg total) by mouth every 6 (six) hours as needed for up to 4 doses for moderate pain (pain). 07/08/21  Yes Domenic Polite, MD  warfarin (COUMADIN) 2.5 MG tablet Take 2.5 mg by mouth See admin instructions. Was taking 2.5 mg by mouth at 5 PM on Mon/Tues/Thurs/Sat until changed on 08/04/21 to 2.5mg  daily on Sundays.   Yes [provider]    Physical Exam: Vitals:   08/06/21 0545 08/06/21 0600 08/06/21 1100 08/06/21 1150  BP: (!) 101/57 98/74 (!) 96/54 106/73  Pulse: 100 100 75 (!) 113  Resp: 20 (!) 23 20   Temp:      TempSrc:      SpO2: 96% 96% (!) 78%    Physical Exam Constitutional:      Appearance: She is obese. She is ill-appearing. She is not diaphoretic.     Comments: Somnolent.  HENT:     Head: Normocephalic and atraumatic.     Mouth/Throat:     Mouth: Mucous membranes are moist.  Eyes:     Pupils: Pupils are equal, round, and reactive to light.  Cardiovascular:     Rate and Rhythm:  Tachycardia present. Rhythm irregular.     Heart sounds: No murmur heard.   No gallop.  Abdominal:     General: Bowel sounds are normal. There is no distension.     Palpations: Abdomen is soft.     Tenderness: There is no abdominal tenderness. There is no right CVA tenderness, left CVA tenderness or rebound.  Musculoskeletal:     Cervical back: Neck supple.     Comments: Moderate to severe generalized weakness.  Skin:    General: Skin is warm and dry.     Findings: Bruising present.     Comments: Positive ecchymosis area on left forearm. Partially visualized, at least stage II or III sacral ulcer. Cushions in place bilaterally to prevent heel pressure injury.  Neurological:     General: No focal deficit present.     Comments: Somnolent.  Oriented x2 only.     Data Reviewed:  There are no new results to review at this time.  Echocardiogram 07/04/2021   1. Left ventricular ejection fraction, by estimation, is 45 to 50%. The  left ventricle has mildly decreased function. The left ventricle  demonstrates regional wall motion abnormalities (see scoring  diagram/findings for description). Left ventricular  diastolic parameters are indeterminate.   2. Right ventricular systolic function is normal. The right ventricular  size is normal. There is moderately elevated pulmonary artery systolic  pressure.   3. Left atrial size was severely dilated.   4. Right atrial size was severely dilated.   5. The mitral valve is normal in structure. Mild mitral valve  regurgitation. No evidence of mitral stenosis.   6. Tricuspid valve regurgitation is mild to moderate.   7. The aortic valve is tricuspid. There is mild calcification of the  aortic valve. There is mild thickening of the aortic valve. Aortic valve  regurgitation is not visualized. Aortic valve sclerosis is present, with  no evidence of aortic valve stenosis.   8. The inferior vena cava is  normal in size with <50% respiratory   variability, suggesting right atrial pressure of 8 mmHg.  EKG:  Vent. rate 113 BPM PR interval * ms QRS duration 101 ms QT/QTcB 391/537 ms P-R-T axes * 106 167 Atrial fibrillation Right axis deviation Low voltage, extremity and precordial leads Abnormal R-wave progression, late transition Nonspecific T abnormalities, lateral leads Prolonged QT interval  Assessment and Plan: Principal Problem:   HCAP (healthcare-associated pneumonia) Admit to PCU with/inpatient. Continue supplemental oxygen. Incentive spirometry as tolerated. Short acting bronchodilators as needed. Continue cefepime per pharmacy. Continue vancomycin per pharmacy. Methylprednisolone 40 mg IVP x1. Check strep pneumoniae urinary antigen.  Check sputum Gram stain, culture and sensitivity. Follow-up blood cultures and sensitivity. Follow-up CBC and CMP in AM. Chest radiograph suspicious for right-sided loculation. Check CT chest with contrast to further characterize.  Active Problems:   Acute metabolic encephalopathy In the setting of HCAP. Avoid sedating medications. Neurochecks every 4 hours. Continue treatment for pneumonia.    Subtherapeutic international normalized ratio (INR) Hold warfarin for now. Daily PT/INR. Warfarin per pharmacy.     Prolonged QT interval Avoid QT prolonging meds. Magnesium sulfate 2 g IVPB given.    Hypertension Continue metoprolol succinate 100 mg p.o. twice daily. Monitor blood pressure and heart rate.    Hyperlipidemia Currently not on medical therapy. Follow-up with PCP.    Type 2 diabetes mellitus (HCC) Carbohydrate modified diet. Continue metformin 500 mg p.o. twice daily. CBG monitoring with [facility].    Atrial fibrillation (chronic) CHA?DS?-VASc Score of at least 7. Hold warfarin and check PT/INR daily. Continue metoprolol and digoxin.    Advance Care Planning:   Code Status: DNR   Consults:   Family Communication: Her Sister Janalyn Shy was at  bedside and provided some of the history.  Severity of Illness: The appropriate patient status for this patient is INPATIENT. Inpatient status is judged to be reasonable and necessary in order to provide the required intensity of service to ensure the patient's safety. The patient's presenting symptoms, physical exam findings, and initial radiographic and laboratory data in the context of their chronic comorbidities is felt to place them at high risk for further clinical deterioration. Furthermore, it is not anticipated that the patient will be medically stable for discharge from the hospital within 2 midnights of admission.   * I certify that at the point of admission it is my clinical judgment that the patient will require inpatient hospital care spanning beyond 2 midnights from the point of admission due to high intensity of service, high risk for further deterioration and high frequency of surveillance required.*  Author: Reubin Milan, MD 08/06/2021 1:05 PM  For on call review [email or web address].

## 2021-08-07 LAB — MRSA NEXT GEN BY PCR, NASAL: MRSA by PCR Next Gen: NOT DETECTED

## 2021-08-07 LAB — HEMOGLOBIN A1C
Hgb A1c MFr Bld: 5.7 % — ABNORMAL HIGH (ref 4.8–5.6)
Mean Plasma Glucose: 116.89 mg/dL

## 2021-08-07 LAB — GLUCOSE, CAPILLARY
Glucose-Capillary: 118 mg/dL — ABNORMAL HIGH (ref 70–99)
Glucose-Capillary: 152 mg/dL — ABNORMAL HIGH (ref 70–99)
Glucose-Capillary: 112 mg/dL — ABNORMAL HIGH (ref 70–99)
Glucose-Capillary: 117 mg/dL — ABNORMAL HIGH (ref 70–99)

## 2021-08-07 LAB — PROTIME-INR
INR: 4.4 (ref 0.8–1.2)
Prothrombin Time: 42.2 seconds — ABNORMAL HIGH (ref 11.4–15.2)

## 2021-08-07 NOTE — Progress Notes (Signed)
HPI: Casey Patel is a 88 y.o. female with medical history significant of chronic atrial fibrillation on warfarin, osteoarthritis, asthma, type II DM, hyperlipidemia, hypertension, history of pneumonia who is coming to the emergency department from her skilled nursing facility due to decreased mentation for the past day and several episodes of emesis.  Her sister stated that she was being treated for pneumonia for the past 2 days at the facility and was improving until she started having emesis yesterday.  When EMS initially evaluated her, she was only responsive to pain.  She is now somnolent, but answers simple questions.  No dyspnea, no sore throat, chest or abdominal pain or nausea at this time.  Unsure if the patient has had a fever or not.  ED: Initial vital signs were temperature 97.4 F, pulse 93, respiration 18, BP 110/67 and O2 sat 97% on 2 LPM of oxygen via nasal cannula.  The patient received cefepime and vancomycin.  I added Solu-Medrol 40 mg IVP x1.  Labwork: Her urinalysis was cloudy with small leukocyte esterase and proteinuria, but was otherwise unremarkable.  CBC showed her white count 10.8, hemoglobin 10.0 g/dL platelets 439.  CMP showed a sodium of 133, chloride 92 CO2 35 mmol/L.  Glucose 122, unremarkable BUN, creatinine and corrected calcium level.  Total protein was 5.7 and albumin 2.4 g/dL.  The rest of the LFTs were normal.  Imaging: Bilateral pleural effusion moderate size.  Questionable loculation on the right.  There is cardiomegaly.  Atelectasis or pneumonia at the bases.  CT head without contrast was limited due to positioning and artifact.  However there was no gross CT evidence for acute intracranial abnormality.  Subjective More awake and alert answering questions  Physical Exam: Vitals:   08/06/21 2011 08/06/21 2223 08/07/21 0630 08/07/21 0732  BP:  116/73 (!) 85/58 101/83  Pulse:  82 75 89  Resp:  14 16   Temp:  98.1 F (36.7 C) 98.3 F (36.8 C)   TempSrc:   Oral Oral   SpO2: 98% 100% 91%    Physical Exam Constitutional:      Appearance: She is obese. She is not ill-appearing or diaphoretic.  HENT:     Head: Normocephalic and atraumatic.     Mouth/Throat:     Mouth: Mucous membranes are moist.  Eyes:     Pupils: Pupils are equal, round, and reactive to light.  Cardiovascular:     Rate and Rhythm: Tachycardia present. Rhythm irregular.     Heart sounds: No murmur heard.   No gallop.  Abdominal:     General: Bowel sounds are normal. There is no distension.     Palpations: Abdomen is soft.     Tenderness: There is no abdominal tenderness. There is no right CVA tenderness, left CVA tenderness or rebound.  Musculoskeletal:     Cervical back: Neck supple.     Comments: Moderate to severe generalized weakness.  Skin:    General: Skin is warm and dry.     Findings: Bruising present.     Comments: Positive ecchymosis area on left forearm. Partially visualized, at least stage II or III sacral ulcer. Cushions in place bilaterally to prevent heel pressure injury.  Neurological:     General: No focal deficit present.     Comments: Somnolent.  Oriented x2 only.  Psychiatric:        Mood and Affect: Mood normal.        Behavior: Behavior normal.     Data Reviewed:  There are no new results to review at this time.  Echocardiogram 07/04/2021   1. Left ventricular ejection fraction, by estimation, is 45 to 50%. The  left ventricle has mildly decreased function. The left ventricle  demonstrates regional wall motion abnormalities (see scoring  diagram/findings for description). Left ventricular  diastolic parameters are indeterminate.   2. Right ventricular systolic function is normal. The right ventricular  size is normal. There is moderately elevated pulmonary artery systolic  pressure.   3. Left atrial size was severely dilated.   4. Right atrial size was severely dilated.   5. The mitral valve is normal in structure. Mild mitral  valve  regurgitation. No evidence of mitral stenosis.   6. Tricuspid valve regurgitation is mild to moderate.   7. The aortic valve is tricuspid. There is mild calcification of the  aortic valve. There is mild thickening of the aortic valve. Aortic valve  regurgitation is not visualized. Aortic valve sclerosis is present, with  no evidence of aortic valve stenosis.   8. The inferior vena cava is normal in size with <50% respiratory  variability, suggesting right atrial pressure of 8 mmHg.  EKG:  Vent. rate 113 BPM PR interval * ms QRS duration 101 ms QT/QTcB 391/537 ms P-R-T axes * 106 167 Atrial fibrillation Right axis deviation Low voltage, extremity and precordial leads Abnormal R-wave progression, late transition Nonspecific T abnormalities, lateral leads Prolonged QT interval  Assessment and Plan: Principal Problem:   HCAP (healthcare-associated pneumonia) Admit to PCU with/inpatient. Continue supplemental oxygen. Incentive spirometry as tolerated. Short acting bronchodilators as needed. Continue cefepime per pharmacy. Continue vancomycin per pharmacy. Methylprednisolone 40 mg IVP x1. Check strep pneumoniae urinary antigen.  Check sputum Gram stain, culture and sensitivity. Follow-up blood cultures and sensitivity. Follow-up CBC and CMP in AM. Chest radiograph suspicious for right-sided loculation. Check CT negative loculation Suspect aspirated on vomit  Active Problems:   Acute metabolic encephalopathy In the setting of HCAP. Avoid sedating medications. Neurochecks every 4 hours. Continue treatment for pneumonia.    Subtherapeutic international normalized ratio (INR) Hold warfarin for now. Daily PT/INR. Warfarin per pharmacy.     Prolonged QT interval Avoid QT prolonging meds. Magnesium sulfate 2 g IVPB given.    Hypertension Continue metoprolol succinate 100 mg p.o. twice daily. Monitor blood pressure and heart rate.    Hyperlipidemia Currently not  on medical therapy. Follow-up with PCP.    Type 2 diabetes mellitus (HCC) Carbohydrate modified diet. Continue metformin 500 mg p.o. twice daily. CBG monitoring with [facility].    Atrial fibrillation (chronic) CHA?DS?-VASc Score of at least 7. Hold warfarin and check PT/INR daily. Continue metoprolol and digoxin.     UTI Should be covered with cefepime and or vancomycin follow-up on cultures    Advance Care Planning:   Code Status: DNR   Author: Phillips Grout, MD 08/07/2021 9:36 AM

## 2021-08-07 NOTE — Plan of Care (Signed)
  Problem: Activity: Goal: Ability to tolerate increased activity will improve Outcome: Progressing   Problem: Clinical Measurements: Goal: Ability to maintain a body temperature in the normal range will improve Outcome: Progressing   Problem: Respiratory: Goal: Ability to maintain adequate ventilation will improve Outcome: Progressing   

## 2021-08-07 NOTE — Progress Notes (Unsigned)
Casey Patel Date of Birth: 06/03/1930   History of Present Illness: Casey Patel is seen  for followup of chronic atrial fibrillation. She has failed multiple attempts at cardioversion before. She is being managed with rate control and anticoagulation. On Toprol and Coumadin.   She was seen in the hospital in September 2019 following a mechanical fall. She had a fall and a UTI. Afib rate was increased. She was managed with metoprolol and addition of digoxin. Echo showed normal LV function with mild MR. Moderate pulmonary HTN and RV dysfunction. Digoxin was later discontinued.  She was seen in the ED in January with weakness and fatigue. WBC, BUN, and creatinine elevated. No clear source of infection. It was felt she was dehydrated and she was given IV fluids and DC. She states it is very hard for her to hydrate. Has a difficult time swallowing and it takes her a long time to drink a glass of water. She has noted more indigestion over the past few days. No palpitations or dizziness. No edema. No dyspnea.  She was admitted in Dec with UTI, fall, dehydration. Afib rate increased. On IV cardizem then transitioned to po. Was readmitted in Jan 2023 with weakness and Afib with RVR. Toprol XL dose was increased to 100 mg twice daily,-Started on digoxin, heart rate improved. 2D echo with EF of 45-50%, no regional wall motion abnormalities, moderate PAH, given advanced age, debility and memory deficits with intermittent confusion she is not a candidate for further work-up, remains euvolemic. Coumadin continued. DC to SNF.   She has severe cervical kyphosis and is seen in a wheelchair.   Current Facility-Administered Medications on File Prior to Visit  Medication Dose Route Frequency Provider Last Rate Last Admin   albuterol (PROVENTIL) (2.5 MG/3ML) 0.083% nebulizer solution 2.5 mg  2.5 mg Nebulization Q4H PRN Reubin Milan, MD       ascorbic acid (VITAMIN C) tablet 1,000 mg  1,000 mg Oral Daily  Reubin Milan, MD   1,000 mg at 08/06/21 1150   ceFEPIme (MAXIPIME) 2 g in sodium chloride 0.9 % 100 mL IVPB  2 g Intravenous Q12H Reubin Milan, MD 200 mL/hr at 08/07/21 0121 2 g at 08/07/21 0121   cholecalciferol (VITAMIN D3) tablet 2,000 Units  2,000 Units Oral Daily Reubin Milan, MD   2,000 Units at 08/06/21 1150   digoxin (LANOXIN) tablet 0.125 mg  0.125 mg Oral Daily Reubin Milan, MD   0.125 mg at 08/06/21 1309   docusate sodium (COLACE) capsule 100 mg  100 mg Oral Daily PRN Reubin Milan, MD       feeding supplement (GLUCERNA SHAKE) ([place]) liquid 237 mL  237 mL Oral BID BM Reubin Milan, MD       fluticasone furoate-vilanterol (BREO ELLIPTA) 100-25 MCG/ACT 1 puff  1 puff Inhalation QHS Reubin Milan, MD   1 puff at 08/06/21 2011   insulin aspart (novoLOG) injection 0-9 Units  0-9 Units Subcutaneous TID WC Reubin Milan, MD   1 Units at 08/06/21 1942   ipratropium (ATROVENT) nebulizer solution 0.5 mg  0.5 mg Nebulization Q4H PRN Reubin Milan, MD       loratadine (CLARITIN) tablet 10 mg  10 mg Oral Daily Reubin Milan, MD   10 mg at 08/06/21 1152   MEDLINE mouth rinse  15 mL Mouth Rinse BID Reubin Milan, MD   15 mL at 08/06/21 2153   metFORMIN (GLUCOPHAGE) tablet 500  mg  500 mg Oral BID WC Reubin Milan, MD   500 mg at 08/06/21 1942   metoprolol succinate (TOPROL-XL) 24 hr tablet 100 mg  100 mg Oral BID Reubin Milan, MD   100 mg at 08/06/21 1150   multivitamin with minerals tablet 1 tablet  1 tablet Oral BID PC Reubin Milan, MD   1 tablet at 08/06/21 1942   nutrition supplement (JUVEN) (JUVEN) powder packet 1 packet  1 packet Oral BID WC Reubin Milan, MD   1 packet at 08/06/21 2152   pantoprazole (PROTONIX) EC tablet 40 mg  40 mg Oral Daily Reubin Milan, MD   40 mg at 08/06/21 1150   vancomycin (VANCOREADY) IVPB 1500 mg/300 mL  1,500 mg Intravenous Q36H Reubin Milan, MD 150  mL/hr at 08/07/21 0415 1,500 mg at 08/07/21 0415   Current Outpatient Medications on File Prior to Visit  Medication Sig Dispense Refill   Ascorbic Acid (VITAMIN C) 1000 MG tablet Take 1,000 mg by mouth daily.     Cholecalciferol (VITAMIN D3) 50 MCG (2000 UT) TABS Take 2,000 Units by mouth daily.     digoxin (LANOXIN) 0.125 MG tablet Take 1 tablet (0.125 mg total) by mouth daily. 30 tablet 0   docusate sodium (COLACE) 100 MG capsule Take 1 capsule (100 mg total) by mouth daily as needed for mild constipation. 10 capsule 0   doxycycline (VIBRA-TABS) 100 MG tablet Take 100 mg by mouth 2 (two) times daily.     feeding supplement, GLUCERNA SHAKE, (GLUCERNA SHAKE) LIQD Take 237 mLs by mouth 2 (two) times daily between meals.     fluticasone furoate-vilanterol (BREO ELLIPTA) 100-25 MCG/INH AEPB Inhale 1 puff into the lungs at bedtime.     levofloxacin (LEVAQUIN) 750 MG tablet Take 750 mg by mouth daily.     loratadine (CLARITIN) 10 MG tablet Take 10 mg by mouth daily.     metFORMIN (GLUCOPHAGE) 500 MG tablet Take 500 mg by mouth 2 (two) times daily with a meal.      methocarbamol (ROBAXIN) 750 MG tablet Take 750 mg by mouth 3 (three) times daily.      metoprolol succinate (TOPROL-XL) 100 MG 24 hr tablet Take 1 tablet (100 mg total) by mouth 2 (two) times daily.     Multiple Vitamins-Minerals (MULTIVITAMIN WITH MINERALS) tablet Take 1 tablet by mouth 2 (two) times daily after a meal.     nutrition supplement, JUVEN, (JUVEN) PACK Take 1 packet by mouth 2 (two) times daily with a meal.  0   omeprazole (PRILOSEC) 20 MG capsule Take 20 mg by mouth daily before breakfast.     OXYGEN Inhale 2 L/min into the lungs continuous.     potassium chloride SA (KLOR-CON M) 20 MEQ tablet Take 20 mEq by mouth daily.     PROAIR DIGIHALER 108 (90 Base) MCG/ACT AEPB Inhale 2 puffs into the lungs every 6 (six) hours as needed ("for up to 4 doses for moderate pain").     traMADol (ULTRAM) 50 MG tablet Take 1 tablet (50 mg  total) by mouth every 6 (six) hours as needed for up to 4 doses for moderate pain (pain). 10 tablet 0   warfarin (COUMADIN) 2.5 MG tablet Take 2.5 mg by mouth See admin instructions. Was taking 2.5 mg by mouth at 5 PM on Mon/Tues/Thurs/Sat until changed on 08/04/21 to 2.5mg  daily on Sundays.      Allergies  Allergen Reactions   Wheat Bran Shortness  Of Breath   Kiwi Extract Other (See Comments)    Causes mouth sores   Orange Fruit [Citrus] Other (See Comments)    Causes mouth sores and "Allergic," per [facility]   Phenergan [Promethazine] Other (See Comments)    Disorientation and "Allergic," per [facility]    Pineapple Other (See Comments)    Causes mouth sores    Promethazine Hcl Other (See Comments)    Disorientation and "Allergic," per [facility]    Tomato Other (See Comments)    Causes mouth sores   Tylenol [Acetaminophen] Other (See Comments)    Causes fast heart rate and "Allergic," per [facility]   Vistaril [Hydroxyzine Hcl] Other (See Comments)    Confusion and "Allergic," per [facility]    Codeine Itching, Rash and Other (See Comments)    "Allergic," per [facility]   Tape Rash and Other (See Comments)    Blisters on skin- "Allergic," per [facility]    Past Medical History:  Diagnosis Date   AF (atrial fibrillation) (HCC)    Arthritis    Asthma    Diabetes mellitus    TYPE 2   Hyperlipidemia    Hypertension    PNA (pneumonia)     Past Surgical History:  Procedure Laterality Date   ABDOMINAL HYSTERECTOMY  1976   BACK SURGERY     lumb x 3   CARDIOVERSION  12/07/2011   Procedure: CARDIOVERSION;  Surgeon: Diallo Ponder M Martin[place], MD;  Location: [place];  Service: Cardiovascular;  Laterality: N/A;   CARPAL TUNNEL RELEASE  09/15/2011   Procedure: CARPAL TUNNEL RELEASE;  Surgeon: Wynonia Sours, MD;  Location: [place];  Service: Orthopedics;  Laterality: Right;   CATARACT EXTRACTION     CHOLECYSTECTOMY  2003   HAND TENDON SURGERY  1999   left wrist - thumb   LAMINECTOMY  1999   with fusion (L1-5)    LAMINECTOMY  19995   with fusion (L3-4)   TONSILLECTOMY  1952    Social History   Tobacco Use  Smoking Status Former   Packs/day: 0.80   Years: 20.00   Pack years: 16.00   Types: Cigarettes   Quit date: 06/29/1978   Years since quitting: 43.1  Smokeless Tobacco Never    Social History   Substance and Sexual Activity  Alcohol Use No    Family History  Problem Relation Age of Onset   Hypertension Mother 71   Stroke Mother 33   Arthritis Mother 15   Heart attack Father 76   Heart disease Father 41   Hypertension Father 67   Ulcers Father 36   Breast cancer Sister    Heart disease Brother        CONGENTIAL HEART DISEASE   Heart disease Sister    Diabetes Sister    Breast cancer Sister     Review of Systems:  As noted in history of present illness. All other systems were reviewed and are negative.  Physical Exam: There were no vitals taken for this visit. GENERAL:  Well appearing, obese WF in wheelchair. HEENT:  PERRL, EOMI, sclera are clear. Oropharynx is clear. NECK:  No jugular venous distention, carotid upstroke brisk and symmetric, no bruits, no thyromegaly or adenopathy. Marked cervical kyphosis.  LUNGS:  Clear to auscultation bilaterally CHEST:  Unremarkable HEART:  IRRR,  PMI not displaced or sustained,S1 and S2 within normal limits, no S3, no S4: no clicks, no rubs, no murmurs Spine: kyphoscoliosis.  ABD:  Soft, nontender. BS +, no masses or bruits.  No hepatomegaly, no splenomegaly EXT:  2 + pulses throughout, no edema, no cyanosis no clubbing SKIN:  Warm and dry.  No rashes NEURO:  Alert and oriented x 3. Cranial nerves II through XII intact. PSYCH:  Cognitively intact   LABORATORY DATA: Lab Results  Component Value Date   WBC 22.0 (H) 08/06/2021   HGB 11.1 (L) 08/06/2021   HCT 38.3 08/06/2021   PLT 450 (H) 08/06/2021   GLUCOSE 142 (H) 08/06/2021   ALT 18 08/05/2021   AST 14 (L) 08/05/2021   NA 135 08/06/2021   K 4.7 08/06/2021   CL 93 (L)  08/06/2021   CREATININE 0.52 08/06/2021   BUN 27 (H) 08/06/2021   CO2 33 (H) 08/06/2021   TSH 1.097 07/03/2021   INR 4.4 (HH) 08/07/2021   HGBA1C 5.7 (H) 08/07/2021   Labs dated 05/07/16: cholesterol 183, triglycerides, 161, HDL 56, LDL 95. A1c 5.6%. CMET normal. Dated 05/24/17: cholesterol 171, triglycerides 101, HDL 58, LDL 93. A1c 6%. Hbg 10.2. Chemistries normal. Dated 05/13/18: cholesterol 174, triglycerides 79, HDL 60, LDL 98. A1c 5.6%. CBC, CMET and TSH normal.  Dated 06/06/19: cholesterol 155, triglycerides 103, HDL 58, LDL 76. A1c 6.2%. CBC and CMET normal Dated 06/09/21: cholesterol 147, triglycerides 75, HDL 53, LDL 79, A1c 5%.   Ecg done 07/23/20 shows Afib with rate 105. Low voltage. Nonspecific ST-T abnormality. I have personally reviewed and interpreted this study.   Echocardiogram 03/08/18: Study Conclusions   - Left ventricle: The cavity size was normal. Wall thickness was   normal. Systolic function was normal. The estimated ejection   fraction was in the range of 55% to 60%. Wall motion was normal;   there were no regional wall motion abnormalities. - Mitral valve: There was mild regurgitation. - Left atrium: The atrium was moderately dilated. - Right ventricle: Systolic function was mildly to moderately   reduced. - Right atrium: The atrium was moderately dilated. - Tricuspid valve: There was mild-moderate regurgitation directed   centrally. - Pulmonary arteries: PA peak pressure: 55 mm Hg (S).  Assessment / Plan: 1. Atrial fibrillation, permanent. Rate is well controlled on Toprol  and she is anticoagulated appropriately with Coumadin. Will continue current therapy. Last INR 2.5 2. HTN controlled. 3. Pulmonary HTN on Echo. Asymptomatic.  4. Recent ED evaluation with dehydration. Poor po fluid intake with dysphagia. Recommend stopping lasix at this point.   Follow up in one year

## 2021-08-07 NOTE — Consult Note (Addendum)
[place] Nurse Consult Note: Reason for Consult: Consult requested for buttocks. Pt has a chronic Stage 4 pressure injury to the inner buttocks which extends to the sacrum; bone is palpable when probed with a swab.  4X2X2cm; wound is red and moist with mod amt tan drainage.  Full thickness skin surrounding bilat buttocks is 70% red, 30% yellow in patchy areas, mod amt tan drainage; appearance and location is consistent with moisture associated skin damage and the affected area is approx 6X6X.2cm  ICD-10 CM Codes for Irritant Dermatitis LL24A2 - Due to fecal, urinary or dual incontinence Pressure Injury POA: Yes  Dressing procedure/placement/frequency: Air mattress ordered to reduce pressure and increase airflow to the affected areas. Topical treatment orders provided for bedside nurses to perform to protect and absorb drainage and provide antimicrobial benefits: Cut piece of Aquacel Kellie Simmering # [phone]) and tuck into sacrum/buttock wound Q day, using swab to fill, then cover outer wounds with the rest of the sheet of Aquacel. Cover with foam dressing (change foam dressing Q 3 days or PRN soiling.) Please re-consult if further assistance is needed.  Thank-you,  Julien Girt MSN, [place], [place], [place], [place]

## 2021-08-07 NOTE — TOC Initial Note (Signed)
Transition of Care [facility]) - Initial/Assessment Note    Patient Details  Name: Casey Patel MRN: [id number] Date of Birth: Feb 14, 1930  Transition of Care [facility]) CM/SW Contact:    Leeroy Cha, RN Phone Number: 08/07/2021, 7:55 AM  Clinical Narrative:                 From [facility] where she resides.   Transition of Care [facility]) Screening Note   Patient Details  Name: Casey Patel Date of Birth: 03/21/1930   Transition of [facility]) CM/SW Contact:    Leeroy Cha, RN Phone Number: 08/07/2021, 7:55 AM    Transition of Care Department [facility]) has reviewed patient and no TOC needs have been identified at this time. We will continue to monitor patient advancement through interdisciplinary progression rounds. If new patient transition needs arise, please place a TOC consult.    Expected Discharge Plan: Skilled Nursing Facility Barriers to Discharge: Continued Medical Work up   Patient Goals and CMS Choice Patient states their goals for this hospitalization and ongoing recovery are:: not stated CMS Medicare.gov Compare Post Acute Care list provided to:: Patient    Expected Discharge Plan and Services Expected Discharge Plan: [place]   Discharge Planning Services: CM Consult Post Acute Care Choice: [place] Living arrangements for the past 2 months: [place]                                      Prior Living Arrangements/Services Living arrangements for the past 2 months: [place] Lives with:: Facility Resident Patient language and need for interpreter reviewed:: Yes              Criminal Activity/Legal Involvement Pertinent to Current Situation/Hospitalization: No - Comment as needed  Activities of Daily Living      Permission Sought/Granted                  Emotional Assessment   Attitude/Demeanor/Rapport: Apprehensive Affect (typically observed): Quiet Orientation: :  Oriented to Self, Oriented to Place, Oriented to  Time, Oriented to Situation Alcohol / Substance Use: Not Applicable Psych Involvement: No (comment)  Admission diagnosis:  Healthcare-associated pneumonia [J18.9] HCAP (healthcare-associated pneumonia) [J18.9] Altered mental status, unspecified altered mental status type [R41.82] Patient Active Problem List   Diagnosis Date Noted   HCAP (healthcare-associated pneumonia) 08/06/2021   Prolonged QT interval 08/06/2021   Generalized weakness    Palliative care by specialist    Advanced care planning/counseling discussion    Atrial flutter with rapid ventricular response ([place]) 07/02/2021   Goals of care, counseling/discussion 15/17/6160   Acute metabolic encephalopathy 73/71/0626   Atrial fibrillation with RVR ([place]) 06/25/2021   Subtherapeutic international normalized ratio (INR) 06/25/2021   UTI (urinary tract infection) 06/24/2021   Pressure injury of skin 03/08/2018   Atrial fibrillation ([place]) 03/07/2018   Cough 01/01/2012   Pneumonia 12/03/2011   Arthritis    Hypertension    Hyperlipidemia    Asthma    Type 2 diabetes mellitus ([place])    PCP:  [name], MD Pharmacy:   [place], [place] - [address] [place] [address] Phone: [phone] Fax: [phone]     Social Determinants of Health (SDOH) Interventions    Readmission Risk Interventions Readmission Risk Prevention Plan 07/04/2021 06/26/2021  Transportation Screening Complete Complete  PCP or Specialist Appt  within 5-7 Days - Complete  Home Care Screening - Complete  Medication Review (RN CM) - Complete  HRI or Home Care Consult Complete -  Social Work Consult for Recovery Care Planning/Counseling Complete -  Palliative Care Screening Complete -  Some recent data might be hidden

## 2021-08-07 NOTE — Progress Notes (Signed)
ANTICOAGULATION CONSULT NOTE -  Pharmacy Consult for warfarin Indication: hx atrial fibrillation  Allergies  Allergen Reactions   Wheat Bran Shortness Of Breath   Kiwi Extract Other (See Comments)    Causes mouth sores   Orange Fruit [Citrus] Other (See Comments)    Causes mouth sores and "Allergic," per [facility]   Phenergan [Promethazine] Other (See Comments)    Disorientation and "Allergic," per [facility]    Pineapple Other (See Comments)    Causes mouth sores    Promethazine Hcl Other (See Comments)    Disorientation and "Allergic," per [facility]    Tomato Other (See Comments)    Causes mouth sores   Tylenol [Acetaminophen] Other (See Comments)    Causes fast heart rate and "Allergic," per [facility]   Vistaril [Hydroxyzine Hcl] Other (See Comments)    Confusion and "Allergic," per [facility]    Codeine Itching, Rash and Other (See Comments)    "Allergic," per [facility]   Tape Rash and Other (See Comments)    Blisters on skin- "Allergic," per [facility]    Patient Measurements:   Heparin Dosing Weight:   Vital Signs: Temp: 98.3 F (36.8 C) (02/09 0630) Temp Source: Oral (02/09 0630) BP: 101/83 (02/09 0732) Pulse Rate: 89 (02/09 0732)  Labs: Recent Labs    08/05/21 2140 08/05/21 2227 08/06/21 1302 08/06/21 1404 08/07/21 0419  HGB 10.0*  --  11.1*  --   --   HCT 34.5*  --  38.3  --   --   PLT 439*  --  450*  --   --   LABPROT  --  42.6*  --  37.1* 42.2*  INR  --  4.5*  --  3.8* 4.4*  CREATININE 0.39*  --  0.52  --   --      Estimated Creatinine Clearance: 50.4 mL/min (by C-G formula based on SCr of 0.52 mg/dL).   Medications:  - PTA warfarin regimen: per [facility] from [place], pt was on 2.5mg  four days a week on Mon, Tue, Thurs, Sat --> then on 08/04/21 this was changed to 2.5 mg on Sundays only (only one time a week)  Assessment: Patient is an 88 y.o F with hx afib on warfarin PTA, presented to the ED on 08/05/21 with AMS.  Pharmacy has been consulted to dose warfarin.  - INR was elevated on  admission (2/7) at  4.5, but pt was on doxycycline for wound and levaquin for PNA  Today, 08/07/2021: - INR remains supra-therapeutic at 4.4 - cbc stable on 2.8 - no bleeding documented  - drug-drug intxns: being on abx (currently on cefepime and vancomycin) can make pt more sensitive to warfarin  Goal of Therapy:  INR 2-3 Monitor platelets by anticoagulation protocol: Yes   Plan:  - hold warfarin today since INR >3 - daily INR - monitor for s/sx bleeding   Peggyann Juba, PharmD, BCPS Pharmacy: [phone] 08/07/2021,8:15 AM

## 2021-08-08 LAB — URINE CULTURE: Culture: >=100000 — AB

## 2021-08-08 LAB — PROTIME-INR
INR: 3.6 — ABNORMAL HIGH (ref 0.8–1.2)
Prothrombin Time: 35.9 seconds — ABNORMAL HIGH (ref 11.4–15.2)

## 2021-08-08 LAB — GLUCOSE, CAPILLARY
Glucose-Capillary: 114 mg/dL — ABNORMAL HIGH (ref 70–99)
Glucose-Capillary: 168 mg/dL — ABNORMAL HIGH (ref 70–99)
Glucose-Capillary: 113 mg/dL — ABNORMAL HIGH (ref 70–99)
Glucose-Capillary: 124 mg/dL — ABNORMAL HIGH (ref 70–99)

## 2021-08-08 NOTE — Progress Notes (Signed)
HPI: Casey Patel is a 88 y.o. female with medical history significant of chronic atrial fibrillation on warfarin, osteoarthritis, asthma, type II DM, hyperlipidemia, hypertension, history of pneumonia who is coming to the emergency department from her skilled nursing facility due to decreased mentation for the past day and several episodes of emesis.  Her sister stated that she was being treated for pneumonia for the past 2 days at the facility and was improving until she started having emesis yesterday.  When EMS initially evaluated her, she was only responsive to pain.  She is now somnolent, but answers simple questions.  No dyspnea, no sore throat, chest or abdominal pain or nausea at this time.  Unsure if the patient has had a fever or not.  ED: Initial vital signs were temperature 97.4 F, pulse 93, respiration 18, BP 110/67 and O2 sat 97% on 2 LPM of oxygen via nasal cannula.  The patient received cefepime and vancomycin.  I added Solu-Medrol 40 mg IVP x1.  Labwork: Her urinalysis was cloudy with small leukocyte esterase and proteinuria, but was otherwise unremarkable.  CBC showed her white count 10.8, hemoglobin 10.0 g/dL platelets 439.  CMP showed a sodium of 133, chloride 92 CO2 35 mmol/L.  Glucose 122, unremarkable BUN, creatinine and corrected calcium level.  Total protein was 5.7 and albumin 2.4 g/dL.  The rest of the LFTs were normal.  Imaging: Bilateral pleural effusion moderate size.  Questionable loculation on the right.  There is cardiomegaly.  Atelectasis or pneumonia at the bases.  CT head without contrast was limited due to positioning and artifact.  However there was no gross CT evidence for acute intracranial abnormality.  Subjective More awake and alert no fever  Physical Exam: Vitals:   08/07/21 0732 08/07/21 1038 08/07/21 2026 08/08/21 0434  BP: 101/83 110/62 (!) 93/51 (!) 105/52  Pulse: 89 (!) 106 70 95  Resp:   14 16  Temp:   (!) 97.5 F (36.4 C) 98.1 F (36.7 C)   TempSrc:      SpO2:   100% 97%   Physical Exam Constitutional:      Appearance: She is obese. She is not ill-appearing or diaphoretic.  HENT:     Head: Normocephalic and atraumatic.     Mouth/Throat:     Mouth: Mucous membranes are moist.  Eyes:     Pupils: Pupils are equal, round, and reactive to light.  Cardiovascular:     Rate and Rhythm: Tachycardia present. Rhythm irregular.     Heart sounds: No murmur heard.   No gallop.  Abdominal:     General: Bowel sounds are normal. There is no distension.     Palpations: Abdomen is soft.     Tenderness: There is no abdominal tenderness. There is no right CVA tenderness, left CVA tenderness or rebound.  Musculoskeletal:     Cervical back: Neck supple.     Comments: Moderate to severe generalized weakness.  Skin:    General: Skin is warm and dry.     Findings: Bruising present.     Comments: Positive ecchymosis area on left forearm. Partially visualized, at least stage II or III sacral ulcer. Cushions in place bilaterally to prevent heel pressure injury.  Neurological:     General: No focal deficit present.     Comments: Somnolent.  Oriented x2 only.  Psychiatric:        Mood and Affect: Mood normal.        Behavior: Behavior normal.  Data Reviewed:  There are no new results to review at this time.  Echocardiogram 07/04/2021   1. Left ventricular ejection fraction, by estimation, is 45 to 50%. The  left ventricle has mildly decreased function. The left ventricle  demonstrates regional wall motion abnormalities (see scoring  diagram/findings for description). Left ventricular  diastolic parameters are indeterminate.   2. Right ventricular systolic function is normal. The right ventricular  size is normal. There is moderately elevated pulmonary artery systolic  pressure.   3. Left atrial size was severely dilated.   4. Right atrial size was severely dilated.   5. The mitral valve is normal in structure. Mild mitral  valve  regurgitation. No evidence of mitral stenosis.   6. Tricuspid valve regurgitation is mild to moderate.   7. The aortic valve is tricuspid. There is mild calcification of the  aortic valve. There is mild thickening of the aortic valve. Aortic valve  regurgitation is not visualized. Aortic valve sclerosis is present, with  no evidence of aortic valve stenosis.   8. The inferior vena cava is normal in size with <50% respiratory  variability, suggesting right atrial pressure of 8 mmHg.  EKG:  Vent. rate 113 BPM PR interval * ms QRS duration 101 ms QT/QTcB 391/537 ms P-R-T axes * 106 167 Atrial fibrillation Right axis deviation Low voltage, extremity and precordial leads Abnormal R-wave progression, late transition Nonspecific T abnormalities, lateral leads Prolonged QT interval  Assessment and Plan: Principal Problem:   HCAP (healthcare-associated pneumonia) Admit to PCU with/inpatient. Continue supplemental oxygen. Incentive spirometry as tolerated. Short acting bronchodilators as needed. Continue cefepime per pharmacy. Continue vancomycin per pharmacy. Methylprednisolone 40 mg IVP x1. Culture negative thus far  Chest radiograph suspicious for right-sided loculation. Check CT negative loculation Suspect aspirated on vomit  Active Problems:   Acute metabolic encephalopathy In the setting of HCAP. Avoid sedating medications. Neurochecks every 4 hours. Continue treatment for pneumonia. Appears to have improved and resolved    Subtherapeutic international normalized ratio (INR) Hold warfarin for now. Daily PT/INR.  Today 3.  6 Warfarin per pharmacy.     Prolonged QT interval Avoid QT prolonging meds. Magnesium sulfate 2 g IVPB given.    Hypertension Continue metoprolol succinate 100 mg p.o. twice daily. Monitor blood pressure and heart rate.    Hyperlipidemia Currently not on medical therapy. Follow-up with PCP.    Type 2 diabetes mellitus  (HCC) Carbohydrate modified diet. Continue metformin 500 mg p.o. twice daily. CBG monitoring with RI SS.    Atrial fibrillation (chronic) CHA?DS?-VASc Score of at least 7. Hold warfarin and check PT/INR daily. Continue metoprolol and digoxin.     UTI Should be covered with cefepime and or vancomycin follow-up on cultures.  Proteus species present final sensitivities pending  Consult case management for disposition planning.  Attempted to call sister via phone no answer.    Advance Care Planning:   Code Status: DNR   Author: Phillips Grout, MD 08/08/2021 9:25 AM

## 2021-08-08 NOTE — Progress Notes (Signed)
Report given from ongoing RN. Assumed Nursing care for patient. Patient asleep however aroused by touch and voice. Assessed. Bathed. Will with the assistance of nurse tech. Will cont to monitor.

## 2021-08-08 NOTE — Progress Notes (Signed)
ANTICOAGULATION CONSULT NOTE -  Pharmacy Consult for warfarin Indication: hx atrial fibrillation  Allergies  Allergen Reactions   Wheat Bran Shortness Of Breath   Kiwi Extract Other (See Comments)    Causes mouth sores   Orange Fruit [Citrus] Other (See Comments)    Causes mouth sores and "Allergic," per [facility]   Phenergan [Promethazine] Other (See Comments)    Disorientation and "Allergic," per [facility]    Pineapple Other (See Comments)    Causes mouth sores    Promethazine Hcl Other (See Comments)    Disorientation and "Allergic," per [facility]    Tomato Other (See Comments)    Causes mouth sores   Tylenol [Acetaminophen] Other (See Comments)    Causes fast heart rate and "Allergic," per [facility]   Vistaril [Hydroxyzine Hcl] Other (See Comments)    Confusion and "Allergic," per [facility]    Codeine Itching, Rash and Other (See Comments)    "Allergic," per [facility]   Tape Rash and Other (See Comments)    Blisters on skin- "Allergic," per [facility]    Patient Measurements:   Heparin Dosing Weight:   Vital Signs: Temp: 98.1 F (36.7 C) (02/10 0434) BP: 105/52 (02/10 0434) Pulse Rate: 95 (02/10 0434)  Labs: Recent Labs    08/05/21 2140 08/05/21 2227 08/06/21 1302 08/06/21 1404 08/07/21 0419 08/08/21 0457  HGB 10.0*  --  11.1*  --   --   --   HCT 34.5*  --  38.3  --   --   --   PLT 439*  --  450*  --   --   --   LABPROT  --    < >  --  37.1* 42.2* 35.9*  INR  --    < >  --  3.8* 4.4* 3.6*  CREATININE 0.39*  --  0.52  --   --   --    < > = values in this interval not displayed.     Estimated Creatinine Clearance: 50.4 mL/min (by C-G formula based on SCr of 0.52 mg/dL).   Medications:  - PTA warfarin regimen: per [facility] from [place], pt was on 2.5mg  four days a week on Mon, Tue, Thurs, Sat --> then on 08/04/21 this was changed to 2.5 mg on Sundays only (only one time a week)  Assessment: Patient is an 88 y.o F with hx afib on warfarin PTA, presented to the ED on 08/05/21 with AMS.  Pharmacy  has been consulted to dose warfarin.  - INR was elevated on admission (2/7) at  4.5, but pt was on doxycycline for wound and levaquin for PNA  Today, 08/08/2021: - INR remains supra-therapeutic at 3.6 - CBC stable on 2.8 - No bleeding documented  - drug-drug intxns: being on abx (currently on cefepime and vancomycin) can make pt more sensitive to warfarin  Goal of Therapy:  INR 2-3 Monitor platelets by anticoagulation protocol: Yes   Plan:  - hold warfarin today since INR remains >3 - daily INR - monitor for s/sx bleeding   Peggyann Juba, PharmD, BCPS Pharmacy: [phone] 08/08/2021,7:12 AM

## 2021-08-08 NOTE — Progress Notes (Signed)
Pharmacy Antibiotic Note  Casey Patel is a 88 y.o. female admitted on 08/05/2021 with history of hyperlipidemia, hypertension, A-fib on Coumadin, diabetes, presenting to the ED with altered mental status.  Facility reports she has been like this for the past 24 hours.  Recently diagnosed with pneumonia and was started on oral doxycycline, however has not been tolerating antibiotics as she has been vomiting them up.  Pharmacy has been consulted for vancomycin and cefepime dosing.  Day #3 abx (vanc/cefepime) - Afebrile - WBC 22 (2/8) - SCr 0.52 (2/8)  Plan: Continue Vancomycin 1500 q36h (AUC 519.2, Scr used 1.0) Continue Cefepime 2gm IV q12h Follow renal function and clinical course *Recommend stopping vancomycin with negative MRSA PCR.  Could also narrow cefepime to Unasyn if aspiration is a concern.    Temp (24hrs), Avg:97.8 F (36.6 C), Min:97.5 F (36.4 C), Max:98.1 F (36.7 C)  Recent Labs  Lab 08/05/21 2140 08/05/21 2223 08/06/21 1302  WBC 10.8*  --  22.0*  CREATININE 0.39*  --  0.52  LATICACIDVEN  --  0.9  --      Estimated Creatinine Clearance: 50.4 mL/min (by C-G formula based on SCr of 0.52 mg/dL).    Allergies  Allergen Reactions   Wheat Bran Shortness Of Breath   Kiwi Extract Other (See Comments)    Causes mouth sores   Orange Fruit [Citrus] Other (See Comments)    Causes mouth sores and "Allergic," per [facility]   Phenergan [Promethazine] Other (See Comments)    Disorientation and "Allergic," per [facility]    Pineapple Other (See Comments)    Causes mouth sores    Promethazine Hcl Other (See Comments)    Disorientation and "Allergic," per [facility]    Tomato Other (See Comments)    Causes mouth sores   Tylenol [Acetaminophen] Other (See Comments)    Causes fast heart rate and "Allergic," per [facility]   Vistaril [Hydroxyzine Hcl] Other (See Comments)    Confusion and "Allergic," per [facility]    Codeine Itching, Rash and Other (See Comments)    "Allergic," per [facility]   Tape Rash  and Other (See Comments)    Blisters on skin- "Allergic," per [facility]    Antimicrobials this admission: 2/8 vanc >> 2/8 cefepime >>  Dose adjustments this admission:  Microbiology results: 2/7 BCx: ngtd 2/7 UCx: >100k Proteus mirabilis 2/9 MRSA PCR: ng  Thank you for allowing pharmacy to be a part of this patients care.  Peggyann Juba, PharmD, BCPS Pharmacy: [phone] 08/08/2021, 11:22 AM

## 2021-08-09 DIAGNOSIS — I4821 Permanent atrial fibrillation: Secondary | ICD-10-CM

## 2021-08-09 DIAGNOSIS — Z515 Encounter for palliative care: Secondary | ICD-10-CM

## 2021-08-09 DIAGNOSIS — R791 Abnormal coagulation profile: Secondary | ICD-10-CM

## 2021-08-09 DIAGNOSIS — R9431 Abnormal electrocardiogram [ECG] [EKG]: Secondary | ICD-10-CM

## 2021-08-09 DIAGNOSIS — G9341 Metabolic encephalopathy: Secondary | ICD-10-CM

## 2021-08-09 DIAGNOSIS — Z7189 Other specified counseling: Secondary | ICD-10-CM

## 2021-08-09 LAB — CBC WITH DIFFERENTIAL/PLATELET
Abs Immature Granulocytes: 0.05 10*3/uL (ref 0.00–0.07)
Basophils Absolute: 0 10*3/uL (ref 0.0–0.1)
Basophils Relative: 0 %
Eosinophils Absolute: 0.1 10*3/uL (ref 0.0–0.5)
Eosinophils Relative: 1 %
HCT: 32.7 % — ABNORMAL LOW (ref 36.0–46.0)
Hemoglobin: 9.6 g/dL — ABNORMAL LOW (ref 12.0–15.0)
Immature Granulocytes: 0 %
Lymphocytes Relative: 5 %
Lymphs Abs: 0.6 10*3/uL — ABNORMAL LOW (ref 0.7–4.0)
MCH: 27.1 pg (ref 26.0–34.0)
MCHC: 29.4 g/dL — ABNORMAL LOW (ref 30.0–36.0)
MCV: 92.4 fL (ref 80.0–100.0)
Monocytes Absolute: 0.5 10*3/uL (ref 0.1–1.0)
Monocytes Relative: 4 %
Neutro Abs: 10.3 10*3/uL — ABNORMAL HIGH (ref 1.7–7.7)
Neutrophils Relative %: 90 %
Platelets: 330 10*3/uL (ref 150–400)
RBC: 3.54 MIL/uL — ABNORMAL LOW (ref 3.87–5.11)
RDW: 17.4 % — ABNORMAL HIGH (ref 11.5–15.5)
WBC: 11.5 10*3/uL — ABNORMAL HIGH (ref 4.0–10.5)
nRBC: 0 % (ref 0.0–0.2)

## 2021-08-09 LAB — BASIC METABOLIC PANEL
Anion gap: 4 — ABNORMAL LOW (ref 5–15)
BUN: 28 mg/dL — ABNORMAL HIGH (ref 8–23)
CO2: 38 mmol/L — ABNORMAL HIGH (ref 22–32)
Calcium: 8.3 mg/dL — ABNORMAL LOW (ref 8.9–10.3)
Chloride: 95 mmol/L — ABNORMAL LOW (ref 98–111)
Creatinine, Ser: 0.34 mg/dL — ABNORMAL LOW (ref 0.44–1.00)
GFR, Estimated: >60 mL/min (ref >60)
Glucose, Bld: 145 mg/dL — ABNORMAL HIGH (ref 70–99)
Potassium: 4 mmol/L (ref 3.5–5.1)
Sodium: 137 mmol/L (ref 135–145)

## 2021-08-09 LAB — GLUCOSE, CAPILLARY
Glucose-Capillary: 141 mg/dL — ABNORMAL HIGH (ref 70–99)
Glucose-Capillary: 126 mg/dL — ABNORMAL HIGH (ref 70–99)
Glucose-Capillary: 128 mg/dL — ABNORMAL HIGH (ref 70–99)
Glucose-Capillary: 96 mg/dL (ref 70–99)

## 2021-08-09 LAB — PHOSPHORUS: Phosphorus: 2.7 mg/dL (ref 2.5–4.6)

## 2021-08-09 LAB — PROTIME-INR
INR: 2.4 — ABNORMAL HIGH (ref 0.8–1.2)
Prothrombin Time: 26.4 seconds — ABNORMAL HIGH (ref 11.4–15.2)

## 2021-08-09 LAB — MAGNESIUM: Magnesium: 1.7 mg/dL (ref 1.7–2.4)

## 2021-08-09 MED ORDER — WARFARIN - PHARMACIST DOSING INPATIENT: Freq: Every day | Status: DC

## 2021-08-09 MED ORDER — WARFARIN SODIUM 2.5 MG PO TABS
2.5000 mg | ORAL_TABLET | Freq: Once | ORAL | Status: AC
  Administered 2021-08-09: 2.5 mg via ORAL
  Filled 2021-08-09: qty 1

## 2021-08-09 NOTE — Progress Notes (Signed)
ANTICOAGULATION CONSULT NOTE -  Pharmacy Consult for warfarin Indication: hx atrial fibrillation  Allergies  Allergen Reactions   Wheat Bran Shortness Of Breath   Kiwi Extract Other (See Comments)    Causes mouth sores   Orange Fruit [Citrus] Other (See Comments)    Causes mouth sores and "Allergic," per [facility]   Phenergan [Promethazine] Other (See Comments)    Disorientation and "Allergic," per [facility]    Pineapple Other (See Comments)    Causes mouth sores    Promethazine Hcl Other (See Comments)    Disorientation and "Allergic," per [facility]    Tomato Other (See Comments)    Causes mouth sores   Tylenol [Acetaminophen] Other (See Comments)    Causes fast heart rate and "Allergic," per [facility]   Vistaril [Hydroxyzine Hcl] Other (See Comments)    Confusion and "Allergic," per [facility]    Codeine Itching, Rash and Other (See Comments)    "Allergic," per [facility]   Tape Rash and Other (See Comments)    Blisters on skin- "Allergic," per [facility]    Patient Measurements:   Heparin Dosing Weight:   Vital Signs: Temp: 98.2 F (36.8 C) (02/11 0947) Temp Source: Axillary (02/11 0947) BP: 104/71 (02/11 0947) Pulse Rate: 91 (02/11 0947)  Labs: Recent Labs    08/06/21 1302 08/06/21 1404 08/07/21 0419 08/08/21 0457 08/09/21 0451 08/09/21 0833  HGB 11.1*  --   --   --   --  9.6*  HCT 38.3  --   --   --   --  32.7*  PLT 450*  --   --   --   --  330  LABPROT  --    < > 42.2* 35.9* 26.4*  --   INR  --    < > 4.4* 3.6* 2.4*  --   CREATININE 0.52  --   --   --   --  0.34*   < > = values in this interval not displayed.     Estimated Creatinine Clearance: 50.4 mL/min (A) (by C-G formula based on SCr of 0.34 mg/dL (L)).   Medications:  - PTA warfarin regimen: 2.5 mg MTTSS with last dose on 2/5 per [facility] d/c on 2/6 ostensibly d/t elevated INR  Assessment: Patient is an 88 y.o F with hx afib on warfarin PTA, presented to the ED on 08/05/21 with AMS.  Pharmacy has been consulted to dose  warfarin.  - INR was elevated on admission (2/7) at  4.5, but pt was on doxycycline for wound and levaquin for PNA  Today, 08/09/2021: - INR now in therapeutic range at 2.4 - Hemoglobin decreased slightly - No bleeding documented  - drug-drug intxns: being on abx (currently on cefepime and vancomycin) can make pt more sensitive to warfarin  Goal of Therapy:  INR 2-3 Monitor platelets by anticoagulation protocol: Yes   Plan:  - warfarin 2.5 mg today - daily INR - monitor for s/sx bleeding  Ulice Dash, PharmD  08/09/2021,11:40 AM

## 2021-08-09 NOTE — Progress Notes (Addendum)
Progress Note    Casey Patel  WYO:[id number] DOB: [birth year]  DOA: 08/05/2021 PCP: [name], MD      Brief Narrative:    Medical records reviewed and are as summarized below:  Casey Patel is a 88 y.o. female with medical history significant of chronic atrial fibrillation on warfarin, osteoarthritis, asthma, type II DM, hyperlipidemia, hypertension, history of pneumonia, who presented to the hospital because of altered mental status and vomiting.  She has started treatment with doxycycline for pneumonia.  She was admitted to the hospital for pneumonia and acute metabolic encephalopathy     Assessment/Plan:   Principal Problem:   HCAP (healthcare-associated pneumonia) Active Problems:   Hypertension   Hyperlipidemia   Type 2 diabetes mellitus ([place])   Atrial fibrillation ([place])   Acute metabolic encephalopathy   Subtherapeutic international normalized ratio (INR)   Prolonged QT interval   Community-acquired pneumonia: Continue empiric IV antibiotics  UTI: Continue IV antibiotics  Acute on chronic hypoxic respiratory failure: She is on 3.5 L/min oxygen.  She uses 2 L/min oxygen at home.  Taper down oxygen as able.  Acute metabolic encephalopathy: Improving  Permanent atrial fibrillation: Warfarin had been held because of supratherapeutic INR.  Prolonged QT interval (537): Avoid medications that prolong QT interval if possible.  Other comorbidities include hypertension, hyperlipidemia   Diet Order             Diet heart healthy/carb modified Room service appropriate? Yes; Fluid consistency: Thin  Diet effective now                      Consultants: None  Procedures: None    Medications:    vitamin C  1,000 mg Oral Daily   cholecalciferol  2,000 Units Oral Daily   digoxin  0.125 mg Oral Daily   feeding supplement (GLUCERNA SHAKE)  237 mL Oral BID BM   fluticasone furoate-vilanterol  1 puff Inhalation QHS   insulin aspart  0-9  Units Subcutaneous TID WC   loratadine  10 mg Oral Daily   mouth rinse  15 mL Mouth Rinse BID   metFORMIN  500 mg Oral BID WC   metoprolol succinate  100 mg Oral BID   multivitamin with minerals  1 tablet Oral BID PC   nutrition supplement (JUVEN)  1 packet Oral BID WC   pantoprazole  40 mg Oral Daily   Continuous Infusions:  ceFEPime (MAXIPIME) IV 2 g (08/09/21 0206)   vancomycin 1,500 mg (08/08/21 1645)     Anti-infectives (From admission, onward)    Start     Dose/Rate Route Frequency Ordered Stop   08/07/21 0400  vancomycin (VANCOREADY) IVPB 1500 mg/300 mL        1,500 mg 150 mL/hr over 120 Minutes Intravenous Every 36 hours 08/06/21 0500     08/06/21 1400  ceFEPIme (MAXIPIME) 2 g in sodium chloride 0.9 % 100 mL IVPB        2 g 200 mL/hr over 30 Minutes Intravenous Every 12 hours 08/06/21 0500     08/06/21 0215  vancomycin (VANCOCIN) IVPB 1000 mg/200 mL premix        1,000 mg 200 mL/hr over 60 Minutes Intravenous  Once 08/06/21 0208 08/06/21 0432   08/06/21 0215  ceFEPIme (MAXIPIME) 2 g in sodium chloride 0.9 % 100 mL IVPB        2 g 200 mL/hr over 30 Minutes Intravenous  Once 08/06/21 0208 08/06/21 0321  Family Communication/Anticipated D/C date and plan/Code Status   DVT prophylaxis:      Code Status: DNR  Family Communication: Plan discussed with her sister at the bedside Disposition Plan: Plan to discharge to SNF   Status is: Inpatient Remains inpatient appropriate because: IV antibiotics               Subjective:   C/o cough.  Her sister was at the bedside  Objective:    Vitals:   08/08/21 2119 08/09/21 0500 08/09/21 0846 08/09/21 0947  BP: (!) 106/49 109/60 122/69 104/71  Pulse: 91 71 92 91  Resp: 16 16 16 18   Temp: 97.7 F (36.5 C) 97.6 F (36.4 C)  98.2 F (36.8 C)  TempSrc: Tympanic Axillary  Axillary  SpO2: 98% 96% 93% 95%   No data found.   Intake/Output Summary (Last 24 hours) at 08/09/2021 1324 Last  data filed at 08/09/2021 1200 Gross per 24 hour  Intake 1060 ml  Output 300 ml  Net 760 ml   There were no vitals filed for this visit.  Exam:  GEN: NAD SKIN: No rash EYES: EOMI ENT: MMM CV: RRR PULM: CTA B ABD: soft, ND, NT, +BS CNS: AAO x 3, non focal EXT: B/l leg and pedal edema, no tenderness    Data Reviewed:   I have personally reviewed following labs and imaging studies:  Labs: Labs show the following:   Basic Metabolic Panel: Recent Labs  Lab 08/05/21 2140 08/06/21 1302 08/09/21 0833  NA 133* 135 137  K 4.8 4.7 4.0  CL 92* 93* 95*  CO2 35* 33* 38*  GLUCOSE 122* 142* 145*  BUN 23 27* 28*  CREATININE 0.39* 0.52 0.34*  CALCIUM 8.0* 8.0* 8.3*  MG  --   --  1.7  PHOS  --   --  2.7   GFR Estimated Creatinine Clearance: 50.4 mL/min (A) (by C-G formula based on SCr of 0.34 mg/dL (L)). Liver Function Tests: Recent Labs  Lab 08/05/21 2140  AST 14*  ALT 18  ALKPHOS 89  BILITOT 0.6  PROT 5.7*  ALBUMIN 2.4*   No results for input(s): LIPASE, AMYLASE in the last 168 hours. No results for input(s): AMMONIA in the last 168 hours. Coagulation profile Recent Labs  Lab 08/05/21 2227 08/06/21 1404 08/07/21 0419 08/08/21 0457 08/09/21 0451  INR 4.5* 3.8* 4.4* 3.6* 2.4*    CBC: Recent Labs  Lab 08/05/21 2140 08/06/21 1302 08/09/21 0833  WBC 10.8* 22.0* 11.5*  NEUTROABS  --   --  10.3*  HGB 10.0* 11.1* 9.6*  HCT 34.5* 38.3 32.7*  MCV 91.8 93.0 92.4  PLT 439* 450* 330   Cardiac Enzymes: No results for input(s): CKTOTAL, CKMB, CKMBINDEX, TROPONINI in the last 168 hours. BNP (last 3 results) No results for input(s): PROBNP in the last 8760 hours. CBG: Recent Labs  Lab 08/08/21 1148 08/08/21 1703 08/08/21 2116 08/09/21 0807 08/09/21 1158  GLUCAP 168* 113* 124* 141* 126*   D-Dimer: No results for input(s): DDIMER in the last 72 hours. Hgb A1c: Recent Labs    08/07/21 0419  HGBA1C 5.7*   Lipid Profile: No results for input(s):  CHOL, HDL, LDLCALC, TRIG, CHOLHDL, LDLDIRECT in the last 72 hours. Thyroid function studies: No results for input(s): TSH, T4TOTAL, T3FREE, THYROIDAB in the last 72 hours.  Invalid input(s): FREET3 Anemia work up: No results for input(s): VITAMINB12, FOLATE, FERRITIN, TIBC, IRON, RETICCTPCT in the last 72 hours. Sepsis Labs: Recent Labs  Lab 08/05/21 2140 08/05/21  2223 08/06/21 1302 08/09/21 0833  WBC 10.8*  --  22.0* 11.5*  LATICACIDVEN  --  0.9  --   --     Microbiology Recent Results (from the past 240 hour(s))  Resp Panel by RT-PCR (Flu A&B, Covid) Nasopharyngeal Swab     Status: None   Collection Time: 08/05/21 10:19 PM   Specimen: Nasopharyngeal Swab; Nasopharyngeal(NP) swabs in vial transport medium  Result Value Ref Range Status   SARS Coronavirus 2 by RT PCR NEGATIVE NEGATIVE Final    Comment: (NOTE) SARS-CoV-2 target nucleic acids are NOT DETECTED.  The SARS-CoV-2 RNA is generally detectable in upper respiratory specimens during the acute phase of infection. The lowest concentration of SARS-CoV-2 viral copies this assay can detect is 138 copies/mL. A negative result does not preclude SARS-Cov-2 infection and should not be used as the sole basis for treatment or other patient management decisions. A negative result may occur with  improper specimen collection/handling, submission of specimen other than nasopharyngeal swab, presence of viral mutation(s) within the areas targeted by this assay, and inadequate number of viral copies(<138 copies/mL). A negative result must be combined with clinical observations, patient history, and epidemiological information. The expected result is Negative.  Fact Sheet for Patients:  EntrepreneurPulse.com.au  Fact Sheet for Healthcare Providers:  IncredibleEmployment.be  This test is no t yet approved or cleared by the [place] FDA and  has been authorized for detection and/or diagnosis  of SARS-CoV-2 by FDA under an Emergency Use Authorization (EUA). This EUA will remain  in effect (meaning this test can be used) for the duration of the COVID-19 declaration under Section 564(b)(1) of the Act, 21 U.S.C.section 360bbb-3(b)(1), unless the authorization is terminated  or revoked sooner.       Influenza A by PCR NEGATIVE NEGATIVE Final   Influenza B by PCR NEGATIVE NEGATIVE Final    Comment: (NOTE) The Xpert Xpress SARS-CoV-2/FLU/RSV plus assay is intended as an aid in the diagnosis of influenza from Nasopharyngeal swab specimens and should not be used as a sole basis for treatment. Nasal washings and aspirates are unacceptable for Xpert Xpress SARS-CoV-2/FLU/RSV testing.  Fact Sheet for Patients: EntrepreneurPulse.com.au  Fact Sheet for Healthcare Providers: IncredibleEmployment.be  This test is not yet approved or cleared by the [place] FDA and has been authorized for detection and/or diagnosis of SARS-CoV-2 by FDA under an Emergency Use Authorization (EUA). This EUA will remain in effect (meaning this test can be used) for the duration of the COVID-19 declaration under Section 564(b)(1) of the Act, 21 U.S.C. section 360bbb-3(b)(1), unless the authorization is terminated or revoked.  Performed at [facility], [place] [address], [place], [place] [address]   Blood culture (routine x 2)     Status: None (Preliminary result)   Collection Time: 08/05/21 10:23 PM   Specimen: Right Antecubital; Blood  Result Value Ref Range Status   Specimen Description   Final    RIGHT ANTECUBITAL Performed at [place] [address], [place], [place] [address]    Special Requests   Final    BOTTLES DRAWN AEROBIC AND ANAEROBIC Blood Culture adequate volume Performed at [place] [address], [place], [place] [address]    Culture   Final    NO GROWTH 3 DAYS Performed at  [facility], [place] [address], [place],  [address]    Report Status PENDING  Incomplete  Urine Culture     Status: Abnormal   Collection  Time: 08/05/21 10:33 PM   Specimen: Urine, Clean Catch  Result Value Ref Range Status   Specimen Description   Final    URINE, CLEAN CATCH Performed at [facility], [place] [address] [place], [place] [address]    Special Requests   Final    NONE Performed at [facility], [place] [address], [place], [place] [address]    Culture >=100,000 COLONIES/mL PROTEUS MIRABILIS (A)  Final   Report Status 08/08/2021 FINAL  Final   Organism ID, Bacteria PROTEUS MIRABILIS (A)  Final      Susceptibility   Proteus mirabilis - MIC*    AMPICILLIN <=2 SENSITIVE Sensitive     CEFAZOLIN <=4 SENSITIVE Sensitive     CEFEPIME <=0.12 SENSITIVE Sensitive     CEFTRIAXONE <=0.25 SENSITIVE Sensitive     CIPROFLOXACIN <=0.25 SENSITIVE Sensitive     GENTAMICIN <=1 SENSITIVE Sensitive     IMIPENEM 2 SENSITIVE Sensitive     NITROFURANTOIN 128 RESISTANT Resistant     TRIMETH/SULFA <=20 SENSITIVE Sensitive     AMPICILLIN/SULBACTAM <=2 SENSITIVE Sensitive     PIP/TAZO <=4 SENSITIVE Sensitive     * >=100,000 COLONIES/mL PROTEUS MIRABILIS  MRSA Next Gen by PCR, Nasal     Status: None   Collection Time: 08/07/21  1:19 PM   Specimen: Nasal Mucosa; Nasal Swab  Result Value Ref Range Status   MRSA by PCR Next Gen NOT DETECTED NOT DETECTED Final    Comment: (NOTE) The GeneXpert MRSA Assay (FDA approved for NASAL specimens only), is one component of a comprehensive MRSA colonization surveillance program. It is not intended to diagnose MRSA infection nor to guide or monitor treatment for MRSA infections. Test performance is not FDA approved in patients less than 40 years old. Performed at [facility], [place] [address] [place], [place] [address]     Procedures and diagnostic studies:  No results  found.             LOS: 3 days   Casey Patel  [facility]   Pager on [email or web address]. If 7PM-7AM, please contact night-coverage at [email or web address]     08/09/2021, 1:24 PM

## 2021-08-09 NOTE — Progress Notes (Signed)
Patient had incontinent episode of stool. Dressing soiled. Change patient sacral wound dressing and packing in entirety. Patient tolerated  well.

## 2021-08-10 ENCOUNTER — Inpatient Hospital Stay (HOSPITAL_COMMUNITY): Payer: Medicare Other

## 2021-08-10 LAB — BODY FLUID CELL COUNT WITH DIFFERENTIAL
Eos, Fluid: 0 %
Lymphs, Fluid: 6 %
Monocyte-Macrophage-Serous Fluid: 22 % — ABNORMAL LOW (ref 50–90)
Neutrophil Count, Fluid: 72 % — ABNORMAL HIGH (ref 0–25)
Total Nucleated Cell Count, Fluid: 617 cu mm (ref 0–1000)

## 2021-08-10 LAB — GLUCOSE, PLEURAL OR PERITONEAL FLUID: Glucose, Fluid: 118 mg/dL

## 2021-08-10 LAB — PROTEIN, PLEURAL OR PERITONEAL FLUID: Total protein, fluid: <3 g/dL

## 2021-08-10 LAB — LACTATE DEHYDROGENASE, PLEURAL OR PERITONEAL FLUID: LD, Fluid: 37 U/L — ABNORMAL HIGH (ref 3–23)

## 2021-08-10 LAB — GLUCOSE, CAPILLARY
Glucose-Capillary: 91 mg/dL (ref 70–99)
Glucose-Capillary: 105 mg/dL — ABNORMAL HIGH (ref 70–99)
Glucose-Capillary: 103 mg/dL — ABNORMAL HIGH (ref 70–99)
Glucose-Capillary: 139 mg/dL — ABNORMAL HIGH (ref 70–99)

## 2021-08-10 LAB — PROTIME-INR
INR: 2.8 — ABNORMAL HIGH (ref 0.8–1.2)
Prothrombin Time: 29.4 seconds — ABNORMAL HIGH (ref 11.4–15.2)

## 2021-08-10 MED ORDER — AMOXICILLIN-POT CLAVULANATE 875-125 MG PO TABS
1.0000 | ORAL_TABLET | Freq: Two times a day (BID) | ORAL | Status: AC
  Administered 2021-08-10 – 2021-08-12 (×6): 1 via ORAL
  Filled 2021-08-10 (×6): qty 1

## 2021-08-10 MED ORDER — SODIUM CHLORIDE 0.9 % IV SOLN: INTRAVENOUS | Status: DC | PRN

## 2021-08-10 MED ORDER — LIDOCAINE HCL 1 % IJ SOLN
INTRAMUSCULAR | Status: AC
  Administered 2021-08-10: 10 mL
  Filled 2021-08-10: qty 20

## 2021-08-10 NOTE — TOC Progression Note (Addendum)
Transition of Care [facility]) - Progression Note    Patient Details  Name: Aylen Stradford MRN: [id number] Date of Birth: 1929/11/26  [facility] CM/SW Contact  Ross Ludwig, [place] Phone Number: 08/10/2021, 10:59 AM  Clinical Narrative:     CSW spoke to [place] at [facility] to ask if patient is short term rehab, or LTC.  Per Abigail Butts, patient can discharge back to [facility] once medically ready for discharge.   Expected Discharge Plan: [place] Barriers to Discharge: Continued Medical Work up  Expected Discharge Plan and Services Expected Discharge Plan: [place]   Discharge Planning Services: CM Consult Post Acute Care Choice: [place] Living arrangements for the past 2 months: [place]                                       Social Determinants of Health (SDOH) Interventions    Readmission Risk Interventions Readmission Risk Prevention Plan 08/07/2021 07/04/2021 06/26/2021  Transportation Screening Complete Complete Complete  PCP or Specialist Appt within 5-7 Days - - Complete  PCP or Specialist Appt within 3-5 Days Complete - -  Home Care Screening - - Complete  Medication Review (RN CM) - - Complete  HRI or Home Care Consult Complete Complete -  Social Work Consult for [place] Planning/Counseling Complete Complete -  Palliative Care Screening Complete Complete -  Medication Review Press photographer) Complete - -  Some recent data might be hidden

## 2021-08-10 NOTE — Consult Note (Addendum)
Consultation Note Date: 08/10/2021   Patient Name: Casey Patel  DOB: [birth year]  MRN: [id number]  Age / Sex: 88 y.o., female  PCP: Tisovec, Fransico Him, MD Referring Physician: Jennye Boroughs, MD  Reason for Consultation: Establishing goals of care  HPI/Patient Profile: 88 y.o. female  with past medical history of atrial fibrillation, hypertension, diabetes mellitus, and general debility.  She has had 3 inpatient admissions in the last 6 months.  Following most recent admission in early January she was transition to skilled facility for rehab.  Patient's sister requested follow-up visit from palliative care this admission.  Clinical Assessment and Goals of Care: I have reviewed medical records including EPIC notes, labs and imaging, and met today with Ms. Pittsley and her sister.   We briefly reviewed history including the fact that [place] lived [place] of time in [place] but moved back to [place] to live with her sister.  She had 1 daughter which she died previously from ovarian cancer.  We discussed patient's current illness and what it means in the larger context of her ongoing co-morbidities.  Natural disease trajectory of chronic illness and debility was discussed. Vaughan Basta verbalizes understanding that patient may not return to her previous baseline.   We discussed her concerns today and Vaughan Basta reports her primary concern is getting her back to rehab out of the hospital.  She is reports being unable to care for [place] at home in her current state and has been satisfied with the care she has been getting at [facility].  She is hopeful she will return there after discharge.  She is open to palliative care continuing to follow at skilled facility as well.  Primary decision maker: Janalyn Shy, sister    SUMMARY OF RECOMMENDATIONS   DNR/DNI Continue current care Sister is hopeful for return to SNF/rehab at  time of discharge Outpatient palliative through [facility] to continue to follow at discharge  Code Status/Advance Care Planning: DNR  Psycho-social/Spiritual:  Created space and opportunity for family to express thoughts and feelings regarding patient's current medical situation.  Emotional support provided  Prognosis:  < 6 months  Discharge Planning: [place] for rehab with Palliative care service follow-up      Primary Diagnoses: Present on Admission:  HCAP (healthcare-associated pneumonia)  Hypertension  Hyperlipidemia  Atrial fibrillation ([place])  Subtherapeutic international normalized ratio (INR)  Acute metabolic encephalopathy  Prolonged QT interval   I have reviewed the medical record, interviewed the patient and family, and examined the patient. The following aspects are pertinent.  Past Medical History:  Diagnosis Date   AF (atrial fibrillation) (HCC)    Arthritis    Asthma    Diabetes mellitus    TYPE 2   Hyperlipidemia    Hypertension    PNA (pneumonia)    Scheduled Meds:  amoxicillin-clavulanate  1 tablet Oral Q12H   vitamin C  1,000 mg Oral Daily   cholecalciferol  2,000 Units Oral Daily   digoxin  0.125 mg Oral Daily   feeding supplement (GLUCERNA SHAKE)  237 mL Oral BID BM   fluticasone furoate-vilanterol  1 puff Inhalation QHS   insulin aspart  0-9 Units Subcutaneous TID WC   loratadine  10 mg Oral Daily   mouth rinse  15 mL Mouth Rinse BID   metFORMIN  500 mg Oral BID WC   metoprolol succinate  100 mg Oral BID   multivitamin with minerals  1 tablet Oral BID PC   nutrition supplement (JUVEN)  1 packet Oral BID WC   pantoprazole  40 mg Oral Daily   Warfarin - Pharmacist Dosing Inpatient   Does not apply q1600   Continuous Infusions:   PRN Meds:.albuterol, docusate sodium, ipratropium   Allergies  Allergen Reactions   Wheat Bran Shortness Of Breath   Kiwi Extract Other (See Comments)    Causes mouth sores   Orange  Fruit [Citrus] Other (See Comments)    Causes mouth sores and "Allergic," per [facility]   Phenergan [Promethazine] Other (See Comments)    Disorientation and "Allergic," per [facility]    Pineapple Other (See Comments)    Causes mouth sores    Promethazine Hcl Other (See Comments)    Disorientation and "Allergic," per [facility]    Tomato Other (See Comments)    Causes mouth sores   Tylenol [Acetaminophen] Other (See Comments)    Causes fast heart rate and "Allergic," per [facility]   Vistaril [Hydroxyzine Hcl] Other (See Comments)    Confusion and "Allergic," per [facility]    Codeine Itching, Rash and Other (See Comments)    "Allergic," per [facility]   Tape Rash and Other (See Comments)    Blisters on skin- "Allergic," per [facility]   Review of Systems  Neurological:  Positive for weakness.   Physical Exam Vitals reviewed.  Constitutional:      General: She is not in acute distress.    Comments: Acutely and chronically ill-appearing  Cardiovascular:     Rate and Rhythm: Rhythm irregularly irregular.  Pulmonary:     Effort: Pulmonary effort is normal.  Skin:    Comments: Ecchymoses on upper extremeties  Neurological:     Mental Status: She is alert.     Motor: Weakness present.    Vital Signs: BP 95/79 (BP Location: Right Arm)    Pulse 81    Temp 97.6 F (36.4 C) (Axillary)    Resp 20    SpO2 96%  Pain Scale: Faces POSS *See Group Information*: 1-Acceptable,Awake and alert Pain Score: 0-No pain   SpO2: SpO2: 96 % O2 Device:SpO2: 96 % O2 Flow Rate: .O2 Flow Rate (L/min): 3 L/min   Palliative Assessment/Data: PPS 30%   Flowsheet Rows    Flowsheet Row Most Recent Value  Intake Tab   Referral Department Hospitalist  Unit at Time of Referral Med/Surg Unit  Palliative Care Primary Diagnosis Sepsis/Infectious Disease  Date Notified 08/06/21  Palliative Care Type Return patient Palliative Care  Reason for referral Clarify Goals of Care  Date of Admission 08/05/21  Date first seen by Palliative Care  08/09/21  # of days Palliative referral response time 3 Day(s)  # of days IP prior to Palliative referral 1  Clinical Assessment   Palliative Performance Scale Score 30%  Psychosocial & Spiritual Assessment   Palliative Care Outcomes   Patient/Family meeting held? Yes  Who was at the meeting? Sister       Time In: 1500 Time Out: 1620 Time Total: 80 minutes Greater than 50%  of this time was spent counseling and coordinating care related  to the above assessment and plan.  Signed by: Micheline Rough, MD   Please contact Palliative Medicine Team phone at [phone] for questions and concerns.  For individual provider: See Shea Evans

## 2021-08-10 NOTE — Progress Notes (Addendum)
Progress Note    Casey Patel  NGE:[id number] DOB: 06-25-1930  DOA: 08/05/2021 PCP: [name], MD      Brief Narrative:    Medical records reviewed and are as summarized below:  Casey Patel is a 88 y.o. female with medical history significant of chronic atrial fibrillation on warfarin, osteoarthritis, asthma, type II DM, hyperlipidemia, hypertension, history of pneumonia, who presented to the hospital because of altered mental status and vomiting.  She has started treatment with doxycycline for pneumonia.  She was admitted to the hospital for pneumonia and acute metabolic encephalopathy     Assessment/Plan:   Principal Problem:   CAP (community acquired pneumonia) Active Problems:   Hypertension   Hyperlipidemia   Type 2 diabetes mellitus ([place])   Atrial fibrillation ([place])   Acute metabolic encephalopathy   Subtherapeutic international normalized ratio (INR)   Prolonged QT interval   Community-acquired pneumonia: Change IV cefepime to Augmentin  Bilateral pleural effusion (moderate to large sized right and moderate-sized left): Plan for right thoracentesis tomorrow  UTI: Change IV cefepime to Augmentin  Acute on chronic hypoxic respiratory failure: She is on 3.5 L/min oxygen.  She uses 2 L/min oxygen at home.  Taper down oxygen as able.  Acute metabolic encephalopathy: Improving  Permanent atrial fibrillation: INR is therapeutic.  Continue warfarin and adjust dose accordingly  Prolonged QT interval (537): Avoid medications that prolong QT interval if possible.  Other comorbidities include hypertension, hyperlipidemia   Diet Order             Diet heart healthy/carb modified Room service appropriate? Yes; Fluid consistency: Thin  Diet effective now                      Consultants: None  Procedures: None    Medications:    amoxicillin-clavulanate  1 tablet Oral Q12H   vitamin C  1,000 mg Oral Daily   cholecalciferol  2,000  Units Oral Daily   digoxin  0.125 mg Oral Daily   feeding supplement (GLUCERNA SHAKE)  237 mL Oral BID BM   fluticasone furoate-vilanterol  1 puff Inhalation QHS   insulin aspart  0-9 Units Subcutaneous TID WC   loratadine  10 mg Oral Daily   mouth rinse  15 mL Mouth Rinse BID   metFORMIN  500 mg Oral BID WC   metoprolol succinate  100 mg Oral BID   multivitamin with minerals  1 tablet Oral BID PC   nutrition supplement (JUVEN)  1 packet Oral BID WC   pantoprazole  40 mg Oral Daily   Warfarin - Pharmacist Dosing Inpatient   Does not apply q1600   Continuous Infusions:     Anti-infectives (From admission, onward)    Start     Dose/Rate Route Frequency Ordered Stop   08/10/21 1000  amoxicillin-clavulanate (AUGMENTIN) 875-125 MG per tablet 1 tablet        1 tablet Oral Every 12 hours 08/10/21 0751 08/13/21 0959   08/07/21 0400  vancomycin (VANCOREADY) IVPB 1500 mg/300 mL  Status:  Discontinued        1,500 mg 150 mL/hr over 120 Minutes Intravenous Every 36 hours 08/06/21 0500 08/09/21 1400   08/06/21 1400  ceFEPIme (MAXIPIME) 2 g in sodium chloride 0.9 % 100 mL IVPB  Status:  Discontinued        2 g 200 mL/hr over 30 Minutes Intravenous Every 12 hours 08/06/21 0500 08/10/21 0751   08/06/21 0215  vancomycin (VANCOCIN)  IVPB 1000 mg/200 mL premix        1,000 mg 200 mL/hr over 60 Minutes Intravenous  Once 08/06/21 0208 08/06/21 0432   08/06/21 0215  ceFEPIme (MAXIPIME) 2 g in sodium chloride 0.9 % 100 mL IVPB        2 g 200 mL/hr over 30 Minutes Intravenous  Once 08/06/21 0208 08/06/21 0321              Family Communication/Anticipated D/C date and plan/Code Status   DVT prophylaxis:      Code Status: DNR  Family Communication: None Disposition Plan: Plan to discharge to SNF   Status is: Inpatient Remains inpatient appropriate because: IV antibiotics               Subjective:   Interval events noted.  She is drowsy and unable to provide any  history  Objective:    Vitals:   08/09/21 2130 08/10/21 0722 08/10/21 1004 08/10/21 1006  BP: 112/73 95/79  (!) 101/57  Pulse: 100 81 81 (!) 101  Resp: 18 20  16   Temp: 97.7 F (36.5 C) 97.6 F (36.4 C)  97.6 F (36.4 C)  TempSrc: Oral Axillary  Axillary  SpO2: 97% 96%  98%   No data found.   Intake/Output Summary (Last 24 hours) at 08/10/2021 1201 Last data filed at 08/10/2021 0242 Gross per 24 hour  Intake 310 ml  Output 453 ml  Net -143 ml   There were no vitals filed for this visit.  Exam:   GEN: NAD SKIN: Warm and dry EYES: EOMI ENT: MMM CV: Irregular rate and rhythm PULM: No wheezing or rales ABD: soft, ND, NT, +BS CNS: Drowsy but arousable EXT: B/l leg edema, no tenderness     Data Reviewed:   I have personally reviewed following labs and imaging studies:  Labs: Labs show the following:   Basic Metabolic Panel: Recent Labs  Lab 08/05/21 2140 08/06/21 1302 08/09/21 0833  NA 133* 135 137  K 4.8 4.7 4.0  CL 92* 93* 95*  CO2 35* 33* 38*  GLUCOSE 122* 142* 145*  BUN 23 27* 28*  CREATININE 0.39* 0.52 0.34*  CALCIUM 8.0* 8.0* 8.3*  MG  --   --  1.7  PHOS  --   --  2.7   GFR Estimated Creatinine Clearance: 50.4 mL/min (A) (by C-G formula based on SCr of 0.34 mg/dL (L)). Liver Function Tests: Recent Labs  Lab 08/05/21 2140  AST 14*  ALT 18  ALKPHOS 89  BILITOT 0.6  PROT 5.7*  ALBUMIN 2.4*   No results for input(s): LIPASE, AMYLASE in the last 168 hours. No results for input(s): AMMONIA in the last 168 hours. Coagulation profile Recent Labs  Lab 08/06/21 1404 08/07/21 0419 08/08/21 0457 08/09/21 0451 08/10/21 0457  INR 3.8* 4.4* 3.6* 2.4* 2.8*    CBC: Recent Labs  Lab 08/05/21 2140 08/06/21 1302 08/09/21 0833  WBC 10.8* 22.0* 11.5*  NEUTROABS  --   --  10.3*  HGB 10.0* 11.1* 9.6*  HCT 34.5* 38.3 32.7*  MCV 91.8 93.0 92.4  PLT 439* 450* 330   Cardiac Enzymes: No results for input(s): CKTOTAL, CKMB, CKMBINDEX,  TROPONINI in the last 168 hours. BNP (last 3 results) No results for input(s): PROBNP in the last 8760 hours. CBG: Recent Labs  Lab 08/09/21 1158 08/09/21 1638 08/09/21 2134 08/10/21 0818 08/10/21 1158  GLUCAP 126* 128* 96 91 105*   D-Dimer: No results for input(s): DDIMER in the last 72  hours. Hgb A1c: No results for input(s): HGBA1C in the last 72 hours.  Lipid Profile: No results for input(s): CHOL, HDL, LDLCALC, TRIG, CHOLHDL, LDLDIRECT in the last 72 hours. Thyroid function studies: No results for input(s): TSH, T4TOTAL, T3FREE, THYROIDAB in the last 72 hours.  Invalid input(s): FREET3 Anemia work up: No results for input(s): VITAMINB12, FOLATE, FERRITIN, TIBC, IRON, RETICCTPCT in the last 72 hours. Sepsis Labs: Recent Labs  Lab 08/05/21 2140 08/05/21 2223 08/06/21 1302 08/09/21 0833  WBC 10.8*  --  22.0* 11.5*  LATICACIDVEN  --  0.9  --   --     Microbiology Recent Results (from the past 240 hour(s))  Resp Panel by RT-PCR (Flu A&B, Covid) Nasopharyngeal Swab     Status: None   Collection Time: 08/05/21 10:19 PM   Specimen: Nasopharyngeal Swab; Nasopharyngeal(NP) swabs in vial transport medium  Result Value Ref Range Status   SARS Coronavirus 2 by RT PCR NEGATIVE NEGATIVE Final    Comment: (NOTE) SARS-CoV-2 target nucleic acids are NOT DETECTED.  The SARS-CoV-2 RNA is generally detectable in upper respiratory specimens during the acute phase of infection. The lowest concentration of SARS-CoV-2 viral copies this assay can detect is 138 copies/mL. A negative result does not preclude SARS-Cov-2 infection and should not be used as the sole basis for treatment or other patient management decisions. A negative result may occur with  improper specimen collection/handling, submission of specimen other than nasopharyngeal swab, presence of viral mutation(s) within the areas targeted by this assay, and inadequate number of viral copies(<138 copies/mL). A negative  result must be combined with clinical observations, patient history, and epidemiological information. The expected result is Negative.  Fact Sheet for Patients:  EntrepreneurPulse.com.au  Fact Sheet for Healthcare Providers:  IncredibleEmployment.be  This test is no t yet approved or cleared by the [place] FDA and  has been authorized for detection and/or diagnosis of SARS-CoV-2 by FDA under an Emergency Use Authorization (EUA). This EUA will remain  in effect (meaning this test can be used) for the duration of the COVID-19 declaration under Section 564(b)(1) of the Act, 21 U.S.C.section 360bbb-3(b)(1), unless the authorization is terminated  or revoked sooner.       Influenza A by PCR NEGATIVE NEGATIVE Final   Influenza B by PCR NEGATIVE NEGATIVE Final    Comment: (NOTE) The Xpert Xpress SARS-CoV-2/FLU/RSV plus assay is intended as an aid in the diagnosis of influenza from Nasopharyngeal swab specimens and should not be used as a sole basis for treatment. Nasal washings and aspirates are unacceptable for Xpert Xpress SARS-CoV-2/FLU/RSV testing.  Fact Sheet for Patients: EntrepreneurPulse.com.au  Fact Sheet for Healthcare Providers: IncredibleEmployment.be  This test is not yet approved or cleared by the [place] FDA and has been authorized for detection and/or diagnosis of SARS-CoV-2 by FDA under an Emergency Use Authorization (EUA). This EUA will remain in effect (meaning this test can be used) for the duration of the COVID-19 declaration under Section 564(b)(1) of the Act, 21 U.S.C. section 360bbb-3(b)(1), unless the authorization is terminated or revoked.  Performed at [facility], [place] [address] [place], [place] [address]   Blood culture (routine x 2)     Status: None (Preliminary result)   Collection Time: 08/05/21 10:23 PM   Specimen: Right Antecubital; Blood   Result Value Ref Range Status   Specimen Description   Final    RIGHT ANTECUBITAL Performed at [facility] [address], [place], [place] [address]  Special Requests   Final    BOTTLES DRAWN AEROBIC AND ANAEROBIC Blood Culture adequate volume Performed at [facility] [address] [place], [place] [address]    Culture   Final    NO GROWTH 4 DAYS Performed at [facility], [place] [address], [place], [place] [address]    Report Status PENDING  Incomplete  Urine Culture     Status: Abnormal   Collection Time: 08/05/21 10:33 PM   Specimen: Urine, Clean Catch  Result Value Ref Range Status   Specimen Description   Final    URINE, CLEAN CATCH Performed at [facility], [place] [address], [place], [place] [address]    Special Requests   Final    NONE Performed at [facility], [place] [address] [place], [place] [address]    Culture >=100,000 COLONIES/mL PROTEUS MIRABILIS (A)  Final   Report Status 08/08/2021 FINAL  Final   Organism ID, Bacteria PROTEUS MIRABILIS (A)  Final      Susceptibility   Proteus mirabilis - MIC*    AMPICILLIN <=2 SENSITIVE Sensitive     CEFAZOLIN <=4 SENSITIVE Sensitive     CEFEPIME <=0.12 SENSITIVE Sensitive     CEFTRIAXONE <=0.25 SENSITIVE Sensitive     CIPROFLOXACIN <=0.25 SENSITIVE Sensitive     GENTAMICIN <=1 SENSITIVE Sensitive     IMIPENEM 2 SENSITIVE Sensitive     NITROFURANTOIN 128 RESISTANT Resistant     TRIMETH/SULFA <=20 SENSITIVE Sensitive     AMPICILLIN/SULBACTAM <=2 SENSITIVE Sensitive     PIP/TAZO <=4 SENSITIVE Sensitive     * >=100,000 COLONIES/mL PROTEUS MIRABILIS  MRSA Next Gen by PCR, Nasal     Status: None   Collection Time: 08/07/21  1:19 PM   Specimen: Nasal Mucosa; Nasal Swab  Result Value Ref Range Status   MRSA by PCR Next Gen NOT DETECTED NOT DETECTED Final    Comment: (NOTE) The GeneXpert MRSA Assay (FDA approved for NASAL specimens  only), is one component of a comprehensive MRSA colonization surveillance program. It is not intended to diagnose MRSA infection nor to guide or monitor treatment for MRSA infections. Test performance is not FDA approved in patients less than 52 years old. Performed at [facility], [place] [address], [place], [place] [address]     Procedures and diagnostic studies:  No results found.             LOS: 4 days   Casey Patel  [facility]   Pager on [email or web address]. If 7PM-7AM, please contact night-coverage at [email or web address]     08/10/2021, 12:01 PM

## 2021-08-10 NOTE — Progress Notes (Signed)
ANTICOAGULATION CONSULT NOTE -  Pharmacy Consult for warfarin Indication: hx atrial fibrillation  Allergies  Allergen Reactions   Wheat Bran Shortness Of Breath   Kiwi Extract Other (See Comments)    Causes mouth sores   Orange Fruit [Citrus] Other (See Comments)    Causes mouth sores and "Allergic," per [facility]   Phenergan [Promethazine] Other (See Comments)    Disorientation and "Allergic," per [facility]    Pineapple Other (See Comments)    Causes mouth sores    Promethazine Hcl Other (See Comments)    Disorientation and "Allergic," per [facility]    Tomato Other (See Comments)    Causes mouth sores   Tylenol [Acetaminophen] Other (See Comments)    Causes fast heart rate and "Allergic," per [facility]   Vistaril [Hydroxyzine Hcl] Other (See Comments)    Confusion and "Allergic," per [facility]    Codeine Itching, Rash and Other (See Comments)    "Allergic," per [facility]   Tape Rash and Other (See Comments)    Blisters on skin- "Allergic," per [facility]    Patient Measurements:   Heparin Dosing Weight:   Vital Signs: Temp: 97.6 F (36.4 C) (02/12 1006) Temp Source: Axillary (02/12 1006) BP: 101/57 (02/12 1006) Pulse Rate: 101 (02/12 1006)  Labs: Recent Labs    08/08/21 0457 08/09/21 0451 08/09/21 0833 08/10/21 0457  HGB  --   --  9.6*  --   HCT  --   --  32.7*  --   PLT  --   --  330  --   LABPROT 35.9* 26.4*  --  29.4*  INR 3.6* 2.4*  --  2.8*  CREATININE  --   --  0.34*  --      Estimated Creatinine Clearance: 50.4 mL/min (A) (by C-G formula based on SCr of 0.34 mg/dL (L)).   Medications:  - PTA warfarin regimen: 2.5 mg MTTSS with last dose on 2/5 per [facility] d/c on 2/6 ostensibly d/t elevated INR  Assessment: Patient is an 88 y.o F with hx afib on warfarin PTA, presented to the ED on 08/05/21 with AMS.  Pharmacy has been consulted to dose warfarin.  - INR was elevated on admission (2/7) at  4.5, but pt was on doxycycline for wound and levaquin for PNA  Today, 08/10/2021: - INR remains  in therapeutic range but trending up to 2.8 after first dose since 2/5 - Hemoglobin decreased slightly (last 2/11) - No bleeding documented  - drug-drug intxns: being on abx (currently on Augmentin) can make pt more sensitive to warfarin  Goal of Therapy:  INR 2-3 Monitor platelets by anticoagulation protocol: Yes   Plan:  - hold warfarin dose today - daily INR - monitor for s/sx bleeding  Ulice Dash, PharmD  08/10/2021,10:26 AM

## 2021-08-10 NOTE — Procedures (Signed)
PROCEDURE SUMMARY:  Successful US guided right thoracentesis. Yielded 1L of amber colored pleural fluid. Pt tolerated procedure well. No immediate complications.  Specimen was sent for labs. CXR ordered.  EBL < 5 mL  Bradd Merlos PA-C 08/10/2021 2:45 PM

## 2021-08-10 NOTE — Evaluation (Addendum)
Physical Therapy Evaluation Patient Details Name: Casey Patel MRN: [id number] DOB: February 29, 1930 Today's Date: 08/10/2021  History of Present Illness  Casey Patel is a 88 y.o. female presents with decreased mentation and several episodes of emesis. Pt admitted 2/7 with healthcare-associated pneumonia. PMH: chronic atrial fibrillation, osteoarthritis, asthma, type II DM, hyperlipidemia, hypertension, history of pneumonia   Clinical Impression  Pt admitted with above diagnosis. Per chart review, pt currently at SNF receiving rehab and prior was receiving assistance from sister as caregiver in Dec 2022. Pt requires total A with rolling and max multimodal cues with sequencing and mobility, only following commands <25% of the time. Pt reaching for hospital gown and bed linen, easily distracted by it requiring cues to for attention to task. Pt difficult to cue with AROM, doesn't resist PROM, no facial wincing or withdrawal noted with BLE movement. No dyspnea noted during eval. Recommend SNF vs LTC pending improvement in cognition and ability to participate with acute PT. RN did state pt didn't sleep well last night and appears more tired today, but has improved some since this morning. Pt currently with functional limitations due to the deficits listed below (see PT Problem List). Pt will benefit from skilled PT to increase their independence and safety with mobility to allow discharge to the venue listed below.          Recommendations for follow up therapy are one component of a multi-disciplinary discharge planning process, led by the attending physician.  Recommendations may be updated based on patient status, additional functional criteria and insurance authorization.  Follow Up Recommendations Skilled nursing-short term rehab (<3 hours/day) (vs LTC)    Assistance Recommended at Discharge Frequent or constant Supervision/Assistance  Patient can return home with the following  Two people to help with  walking and/or transfers;Two people to help with bathing/dressing/bathroom;Assistance with feeding;Assist for transportation    Equipment Recommendations None recommended by PT  Recommendations for Other Services       Functional Status Assessment Patient has had a recent decline in their functional status and demonstrates the ability to make significant improvements in function in a reasonable and predictable amount of time.     Precautions / Restrictions Precautions Precautions: Fall Precaution Comments: wound Restrictions Weight Bearing Restrictions: No      Mobility  Bed Mobility Overal bed mobility: Needs Assistance Bed Mobility: Rolling Rolling: Total assist  General bed mobility comments: difficulty cueing pt to reach for bedrails to perform rolling despite max multimodal cues, ultimaltely total A, pt easily distracted by linen and hospital gown reaching for them    Transfers   Ambulation/Gait   Stairs            Wheelchair Mobility    Modified Rankin (Stroke Patients Only)       Balance        Pertinent Vitals/Pain Pain Assessment Pain Assessment: Faces Pain Score: 0-No pain    Home Living Family/patient expects to be discharged to:: Skilled nursing facility  Additional Comments: per chart review, pt at rehab since 1/10 discharge, prior was ambulating with RW and receiving assist from sister who was caregiver    Prior Function Prior Level of Function : Patient poor historian/Family not available      Hand Dominance        Extremity/Trunk Assessment   Upper Extremity Assessment Upper Extremity Assessment: Defer to OT evaluation    Lower Extremity Assessment Lower Extremity Assessment: Generalized weakness;RLE deficits/detail;LLE deficits/detail;Difficult to assess due to impaired cognition RLE Deficits / Details:  difficulty cueing AROM, able to achieve ankle neutral PROM without wincing or withdrawal, knee with full extension and ~90 deg  flexion PROM, hip to ~45 deg flexion PROM; pt resting in slightly hip external rotation, able to achieve neutral hip positioning passively LLE Deficits / Details: difficulty cueing AROM, able to achieve ankle neutral PROM without wincing or withdrawal, knee with full extension and ~90 deg flexion PROM, hip to ~45 deg flexion PROM; pt resting in slightly hip external rotation, able to achieve neutral hip positioning passively       Communication   Communication: Other (comment) (minimally conversational)  Cognition Arousal/Alertness: Lethargic Behavior During Therapy: Flat affect Overall Cognitive Status: No family/caregiver present to determine baseline cognitive functioning  General Comments: Pt lethargic and flat during eval, able to track therapist around room, states name appropriately, hands restless actively reaching for hospital gown and bed linens, difficult to redirect, follows commands <25% of the time.        General Comments      Exercises     Assessment/Plan    PT Assessment Patient needs continued PT services  PT Problem List Decreased strength;Decreased range of motion;Decreased activity tolerance;Decreased balance;Decreased mobility;Decreased coordination;Decreased cognition;Decreased knowledge of use of DME;Decreased safety awareness;Cardiopulmonary status limiting activity;Decreased skin integrity       PT Treatment Interventions DME instruction;Gait training;Functional mobility training;Therapeutic activities;Therapeutic exercise;Balance training;Cognitive remediation;Patient/family education    PT Goals (Current goals can be found in the Care Plan section)  Acute Rehab PT Goals Patient Stated Goal: none stated PT Goal Formulation: Patient unable to participate in goal setting Time For Goal Achievement: 08/24/21 Potential to Achieve Goals: Fair    Frequency Min 2X/week     Co-evaluation               AM-PAC PT "6 Clicks" Mobility  Outcome Measure  Help needed turning from your back to your side while in a flat bed without using bedrails?: Total Help needed moving from lying on your back to sitting on the side of a flat bed without using bedrails?: Total Help needed moving to and from a bed to a chair (including a wheelchair)?: Total Help needed standing up from a chair using your arms (e.g., wheelchair or bedside chair)?: Total Help needed to walk in hospital room?: Total Help needed climbing 3-5 steps with a railing? : Total 6 Click Score: 6    End of Session Equipment Utilized During Treatment: Oxygen Activity Tolerance: Patient limited by lethargy Patient left: in bed;with call bell/phone within reach Nurse Communication: Mobility status PT Visit Diagnosis: Other abnormalities of gait and mobility (R26.89);Muscle weakness (generalized) (M62.81)    Time: 2542-7062 PT Time Calculation (min) (ACUTE ONLY): 9 min   Charges:   PT Evaluation $PT Eval Low Complexity: 1 Low           Tori Argelia Formisano PT, DPT 08/10/21, 2:39 PM

## 2021-08-11 LAB — CBC WITH DIFFERENTIAL/PLATELET
Abs Immature Granulocytes: 0.1 10*3/uL — ABNORMAL HIGH (ref 0.00–0.07)
Basophils Absolute: 0 10*3/uL (ref 0.0–0.1)
Basophils Relative: 0 %
Eosinophils Absolute: 0.2 10*3/uL (ref 0.0–0.5)
Eosinophils Relative: 1 %
HCT: 33 % — ABNORMAL LOW (ref 36.0–46.0)
Hemoglobin: 9.7 g/dL — ABNORMAL LOW (ref 12.0–15.0)
Immature Granulocytes: 1 %
Lymphocytes Relative: 4 %
Lymphs Abs: 0.7 10*3/uL (ref 0.7–4.0)
MCH: 26.9 pg (ref 26.0–34.0)
MCHC: 29.4 g/dL — ABNORMAL LOW (ref 30.0–36.0)
MCV: 91.4 fL (ref 80.0–100.0)
Monocytes Absolute: 0.8 10*3/uL (ref 0.1–1.0)
Monocytes Relative: 5 %
Neutro Abs: 14 10*3/uL — ABNORMAL HIGH (ref 1.7–7.7)
Neutrophils Relative %: 89 %
Platelets: 309 10*3/uL (ref 150–400)
RBC: 3.61 MIL/uL — ABNORMAL LOW (ref 3.87–5.11)
RDW: 17.2 % — ABNORMAL HIGH (ref 11.5–15.5)
WBC: 15.9 10*3/uL — ABNORMAL HIGH (ref 4.0–10.5)
nRBC: 0 % (ref 0.0–0.2)

## 2021-08-11 LAB — PROTIME-INR
INR: 2.8 — ABNORMAL HIGH (ref 0.8–1.2)
Prothrombin Time: 29.8 seconds — ABNORMAL HIGH (ref 11.4–15.2)

## 2021-08-11 LAB — BLOOD GAS, ARTERIAL
Acid-Base Excess: 10.5 mmol/L — ABNORMAL HIGH (ref 0.0–2.0)
Acid-Base Excess: 9.3 mmol/L — ABNORMAL HIGH (ref 0.0–2.0)
Bicarbonate: 35.5 mmol/L — ABNORMAL HIGH (ref 20.0–28.0)
Bicarbonate: 36.6 mmol/L — ABNORMAL HIGH (ref 20.0–28.0)
Drawn by: 56037
FIO2: 32
O2 Saturation: 97.5 %
O2 Saturation: 98.6 %
Patient temperature: 98.6
Patient temperature: 98.9
pCO2 arterial: 60.3 mmHg — ABNORMAL HIGH (ref 32.0–48.0)
pCO2 arterial: 61.3 mmHg — ABNORMAL HIGH (ref 32.0–48.0)
pH, Arterial: 7.387 (ref 7.350–7.450)
pH, Arterial: 7.394 (ref 7.350–7.450)
pO2, Arterial: 130 mmHg — ABNORMAL HIGH (ref 83.0–108.0)
pO2, Arterial: 99.8 mmHg (ref 83.0–108.0)

## 2021-08-11 LAB — COMPREHENSIVE METABOLIC PANEL
ALT: 14 U/L (ref 0–44)
AST: 12 U/L — ABNORMAL LOW (ref 15–41)
Albumin: 2.1 g/dL — ABNORMAL LOW (ref 3.5–5.0)
Alkaline Phosphatase: 74 U/L (ref 38–126)
Anion gap: 6 (ref 5–15)
BUN: 25 mg/dL — ABNORMAL HIGH (ref 8–23)
CO2: 34 mmol/L — ABNORMAL HIGH (ref 22–32)
Calcium: 8.2 mg/dL — ABNORMAL LOW (ref 8.9–10.3)
Chloride: 96 mmol/L — ABNORMAL LOW (ref 98–111)
Creatinine, Ser: <0.3 mg/dL — ABNORMAL LOW (ref 0.44–1.00)
Glucose, Bld: 123 mg/dL — ABNORMAL HIGH (ref 70–99)
Potassium: 4.2 mmol/L (ref 3.5–5.1)
Sodium: 136 mmol/L (ref 135–145)
Total Bilirubin: 0.4 mg/dL (ref 0.3–1.2)
Total Protein: 5.1 g/dL — ABNORMAL LOW (ref 6.5–8.1)

## 2021-08-11 LAB — CULTURE, BLOOD (ROUTINE X 2)
Culture: NO GROWTH
Special Requests: ADEQUATE

## 2021-08-11 LAB — LACTATE DEHYDROGENASE: LDH: 125 U/L (ref 98–192)

## 2021-08-11 LAB — GRAM STAIN

## 2021-08-11 LAB — GLUCOSE, CAPILLARY
Glucose-Capillary: 123 mg/dL — ABNORMAL HIGH (ref 70–99)
Glucose-Capillary: 101 mg/dL — ABNORMAL HIGH (ref 70–99)
Glucose-Capillary: 123 mg/dL — ABNORMAL HIGH (ref 70–99)
Glucose-Capillary: 133 mg/dL — ABNORMAL HIGH (ref 70–99)

## 2021-08-11 MED ORDER — WARFARIN SODIUM 1 MG PO TABS
1.0000 mg | ORAL_TABLET | Freq: Once | ORAL | Status: AC
  Administered 2021-08-11: 1 mg via ORAL
  Filled 2021-08-11: qty 1

## 2021-08-11 NOTE — Progress Notes (Signed)
Notified on call provider about patient having about 100 mL of urine output. Bladder scanned patient and had 253 mL urine in bladder. Patient had at least one urinary incontinent episode. Patient is not taking much in, and no new orders from on call provider. Will continue to monitor and pass along to dayshift nurse.

## 2021-08-11 NOTE — Plan of Care (Signed)
°  Problem: Education: Goal: Knowledge of General Education information will improve Description: Including pain rating scale, medication(s)/side effects and non-pharmacologic comfort measures Outcome: Not Progressing   Problem: Education: Goal: Knowledge of General Education information will improve Description: Including pain rating scale, medication(s)/side effects and non-pharmacologic comfort measures Outcome: Not Progressing

## 2021-08-11 NOTE — TOC Progression Note (Signed)
Transition of Care [facility]) - Progression Note    Patient Details  Name: Zyrah Wiswell MRN: [id number] Date of Birth: [birth year]  Transition of [facility]) CM/SW Contact  Leeroy Cha, RN Phone Number: 08/11/2021, 11:11 AM  Clinical Narrative:    Following for toc needs   Expected Discharge Plan: [place] Barriers to Discharge: Continued Medical Work up  Expected Discharge Plan and Services Expected Discharge Plan: [place]   Discharge Planning Services: CM Consult Post Acute Care Choice: [place] Living arrangements for the past 2 months: [place]                                       Social Determinants of Health (SDOH) Interventions    Readmission Risk Interventions Readmission Risk Prevention Plan 08/07/2021 07/04/2021 06/26/2021  Transportation Screening Complete Complete Complete  PCP or Specialist Appt within 5-7 Days - - Complete  PCP or Specialist Appt within 3-5 Days Complete - -  Home Care Screening - - Complete  Medication Review (RN CM) - - Complete  HRI or Home Care Consult Complete Complete -  Social Work Consult for [place] Planning/Counseling Complete Complete -  Palliative Care Screening Complete Complete -  Medication Review Press photographer) Complete - -  Some recent data might be hidden

## 2021-08-11 NOTE — Plan of Care (Signed)
°  Problem: Clinical Measurements: Goal: Ability to maintain a body temperature in the normal range will improve Outcome: Progressing   Problem: Pain Managment: Goal: General experience of comfort will improve Outcome: Progressing   Problem: Safety: Goal: Ability to remain free from injury will improve Outcome: Progressing   Problem: Skin Integrity: Goal: Risk for impaired skin integrity will decrease Outcome: Progressing

## 2021-08-11 NOTE — Progress Notes (Signed)
Progress Note    Casey Patel  INO:[id number] DOB: 1929/08/27  DOA: 08/05/2021 PCP: [name], MD      Brief Narrative:    Medical records reviewed and are as summarized below:  Casey Patel is a 88 y.o. female with medical history significant of chronic atrial fibrillation on warfarin, osteoarthritis, asthma, type II DM, hyperlipidemia, hypertension, history of pneumonia, who presented to the hospital because of altered mental status and vomiting.  She has started treatment with doxycycline for pneumonia.  She was admitted to the hospital for pneumonia and acute metabolic encephalopathy.  Urinalysis was also suggestive of acute UTI.  She was treated with empiric IV antibiotics.  She underwent right-sided thoracentesis.  Right pleural effusion.     Assessment/Plan:   Principal Problem:   CAP (community acquired pneumonia) Active Problems:   Hypertension   Hyperlipidemia   Type 2 diabetes mellitus (HCC)   Atrial fibrillation (HCC)   Acute metabolic encephalopathy   Subtherapeutic international normalized ratio (INR)   Prolonged QT interval   Community-acquired pneumonia: Continue Augmentin  Bilateral pleural effusion (moderate to large sized right and moderate-sized left): S/p right-sided thoracentesis with removal of 1 L of transudative fluid  UTI: Continue Augmentin  Acute on chronic hypoxic respiratory failure, chronic hypercapnic respiratory failure: She is on 3 L/min oxygen.  She uses 2 L/min oxygen at home.  Taper down oxygen as able.  Acute metabolic encephalopathy, drowsiness: ABG reviewed chronic hypercapnia but no evidence of acute respiratory acidosis.  Permanent atrial fibrillation: INR is therapeutic.  Continue warfarin and adjust dose accordingly  Prolonged QT interval (537): Avoid medications that prolong QT interval if possible.  Other comorbidities include hypertension, hyperlipidemia   Diet Order             Diet heart healthy/carb  modified Room service appropriate? Yes; Fluid consistency: Thin  Diet effective now                      Consultants: None  Procedures: None    Medications:    amoxicillin-clavulanate  1 tablet Oral Q12H   vitamin C  1,000 mg Oral Daily   cholecalciferol  2,000 Units Oral Daily   digoxin  0.125 mg Oral Daily   feeding supplement (GLUCERNA SHAKE)  237 mL Oral BID BM   fluticasone furoate-vilanterol  1 puff Inhalation QHS   insulin aspart  0-9 Units Subcutaneous TID WC   loratadine  10 mg Oral Daily   mouth rinse  15 mL Mouth Rinse BID   metFORMIN  500 mg Oral BID WC   metoprolol succinate  100 mg Oral BID   multivitamin with minerals  1 tablet Oral BID PC   nutrition supplement (JUVEN)  1 packet Oral BID WC   pantoprazole  40 mg Oral Daily   Warfarin - Pharmacist Dosing Inpatient   Does not apply q1600   Continuous Infusions:  sodium chloride 10 mL/hr at 08/10/21 1312      Anti-infectives (From admission, onward)    Start     Dose/Rate Route Frequency Ordered Stop   08/10/21 1000  amoxicillin-clavulanate (AUGMENTIN) 875-125 MG per tablet 1 tablet        1 tablet Oral Every 12 hours 08/10/21 0751 08/13/21 0959   08/07/21 0400  vancomycin (VANCOREADY) IVPB 1500 mg/300 mL  Status:  Discontinued        1,500 mg 150 mL/hr over 120 Minutes Intravenous Every 36 hours 08/06/21 0500 08/09/21 1400  08/06/21 1400  ceFEPIme (MAXIPIME) 2 g in sodium chloride 0.9 % 100 mL IVPB  Status:  Discontinued        2 g 200 mL/hr over 30 Minutes Intravenous Every 12 hours 08/06/21 0500 08/10/21 0751   08/06/21 0215  vancomycin (VANCOCIN) IVPB 1000 mg/200 mL premix        1,000 mg 200 mL/hr over 60 Minutes Intravenous  Once 08/06/21 0208 08/06/21 0432   08/06/21 0215  ceFEPIme (MAXIPIME) 2 g in sodium chloride 0.9 % 100 mL IVPB        2 g 200 mL/hr over 30 Minutes Intravenous  Once 08/06/21 0208 08/06/21 0321              Family Communication/Anticipated D/C date and  plan/Code Status   DVT prophylaxis:      Code Status: DNR  Family Communication: None Disposition Plan: Plan to discharge to SNF   Status is: Inpatient Remains inpatient appropriate because: IV antibiotics               Subjective:   Interval events noted.  She is drowsy and unable to provide any history  Objective:    Vitals:   08/10/21 2156 08/11/21 0430 08/11/21 1113 08/11/21 1157  BP: (!) 99/58 (!) 102/50 (!) 117/58 91/73  Pulse: 98 78 78 89  Resp: 18 18  16   Temp: 98.2 F (36.8 C) 97.9 F (36.6 C)  97.8 F (36.6 C)  TempSrc: Oral Oral    SpO2: 94% 95%    Weight:  112 kg    Height:  5\' 6"  (1.676 m)     No data found.   Intake/Output Summary (Last 24 hours) at 08/11/2021 1338 Last data filed at 08/11/2021 0700 Gross per 24 hour  Intake 491.5 ml  Output 100 ml  Net 391.5 ml   Filed Weights   08/11/21 0430  Weight: 112 kg    Exam:  GEN: NAD SKIN: Warm and dry EYES: EOMI ENT: MMM CV: Irregular rate and rhythm PULM: CTA B ABD: soft, obese, +BS CNS: Drowsy and difficult to arouse EXT: Bilateral leg edema       Data Reviewed:   I have personally reviewed following labs and imaging studies:  Labs: Labs show the following:   Basic Metabolic Panel: Recent Labs  Lab 08/05/21 2140 08/06/21 1302 08/09/21 0833 08/11/21 0424  NA 133* 135 137 136  K 4.8 4.7 4.0 4.2  CL 92* 93* 95* 96*  CO2 35* 33* 38* 34*  GLUCOSE 122* 142* 145* 123*  BUN 23 27* 28* 25*  CREATININE 0.39* 0.52 0.34* <0.30*  CALCIUM 8.0* 8.0* 8.3* 8.2*  MG  --   --  1.7  --   PHOS  --   --  2.7  --    GFR CrCl cannot be calculated (This lab value cannot be used to calculate CrCl because it is not a number: <0.30). Liver Function Tests: Recent Labs  Lab 08/05/21 2140 08/11/21 0424  AST 14* 12*  ALT 18 14  ALKPHOS 89 74  BILITOT 0.6 0.4  PROT 5.7* 5.1*  ALBUMIN 2.4* 2.1*   No results for input(s): LIPASE, AMYLASE in the last 168 hours. No results  for input(s): AMMONIA in the last 168 hours. Coagulation profile Recent Labs  Lab 08/07/21 0419 08/08/21 0457 08/09/21 0451 08/10/21 0457 08/11/21 0424  INR 4.4* 3.6* 2.4* 2.8* 2.8*    CBC: Recent Labs  Lab 08/05/21 2140 08/06/21 1302 08/09/21 0833 08/11/21 0424  WBC  10.8* 22.0* 11.5* 15.9*  NEUTROABS  --   --  10.3* 14.0*  HGB 10.0* 11.1* 9.6* 9.7*  HCT 34.5* 38.3 32.7* 33.0*  MCV 91.8 93.0 92.4 91.4  PLT 439* 450* 330 309   Cardiac Enzymes: No results for input(s): CKTOTAL, CKMB, CKMBINDEX, TROPONINI in the last 168 hours. BNP (last 3 results) No results for input(s): PROBNP in the last 8760 hours. CBG: Recent Labs  Lab 08/10/21 1158 08/10/21 1709 08/10/21 2158 08/11/21 0721 08/11/21 1152  GLUCAP 105* 103* 139* 123* 101*   D-Dimer: No results for input(s): DDIMER in the last 72 hours. Hgb A1c: No results for input(s): HGBA1C in the last 72 hours.  Lipid Profile: No results for input(s): CHOL, HDL, LDLCALC, TRIG, CHOLHDL, LDLDIRECT in the last 72 hours. Thyroid function studies: No results for input(s): TSH, T4TOTAL, T3FREE, THYROIDAB in the last 72 hours.  Invalid input(s): FREET3 Anemia work up: No results for input(s): VITAMINB12, FOLATE, FERRITIN, TIBC, IRON, RETICCTPCT in the last 72 hours. Sepsis Labs: Recent Labs  Lab 08/05/21 2140 08/05/21 2223 08/06/21 1302 08/09/21 0833 08/11/21 0424  WBC 10.8*  --  22.0* 11.5* 15.9*  LATICACIDVEN  --  0.9  --   --   --     Microbiology Recent Results (from the past 240 hour(s))  Resp Panel by RT-PCR (Flu A&B, Covid) Nasopharyngeal Swab     Status: None   Collection Time: 08/05/21 10:19 PM   Specimen: Nasopharyngeal Swab; Nasopharyngeal(NP) swabs in vial transport medium  Result Value Ref Range Status   SARS Coronavirus 2 by RT PCR NEGATIVE NEGATIVE Final    Comment: (NOTE) SARS-CoV-2 target nucleic acids are NOT DETECTED.  The SARS-CoV-2 RNA is generally detectable in upper respiratory specimens  during the acute phase of infection. The lowest concentration of SARS-CoV-2 viral copies this assay can detect is 138 copies/mL. A negative result does not preclude SARS-Cov-2 infection and should not be used as the sole basis for treatment or other patient management decisions. A negative result may occur with  improper specimen collection/handling, submission of specimen other than nasopharyngeal swab, presence of viral mutation(s) within the areas targeted by this assay, and inadequate number of viral copies(<138 copies/mL). A negative result must be combined with clinical observations, patient history, and epidemiological information. The expected result is Negative.  Fact Sheet for Patients:  EntrepreneurPulse.com.au  Fact Sheet for Healthcare Providers:  IncredibleEmployment.be  This test is no t yet approved or cleared by the [place] FDA and  has been authorized for detection and/or diagnosis of SARS-CoV-2 by FDA under an Emergency Use Authorization (EUA). This EUA will remain  in effect (meaning this test can be used) for the duration of the COVID-19 declaration under Section 564(b)(1) of the Act, 21 U.S.C.section 360bbb-3(b)(1), unless the authorization is terminated  or revoked sooner.       Influenza A by PCR NEGATIVE NEGATIVE Final   Influenza B by PCR NEGATIVE NEGATIVE Final    Comment: (NOTE) The Xpert Xpress SARS-CoV-2/FLU/RSV plus assay is intended as an aid in the diagnosis of influenza from Nasopharyngeal swab specimens and should not be used as a sole basis for treatment. Nasal washings and aspirates are unacceptable for Xpert Xpress SARS-CoV-2/FLU/RSV testing.  Fact Sheet for Patients: EntrepreneurPulse.com.au  Fact Sheet for Healthcare Providers: IncredibleEmployment.be  This test is not yet approved or cleared by the [place] FDA and has been authorized for detection  and/or diagnosis of SARS-CoV-2 by FDA under an Emergency Use Authorization (EUA). This EUA  will remain in effect (meaning this test can be used) for the duration of the COVID-19 declaration under Section 564(b)(1) of the Act, 21 U.S.C. section 360bbb-3(b)(1), unless the authorization is terminated or revoked.  Performed at [facility], [place] [address], [place], [place] [address]   Blood culture (routine x 2)     Status: None   Collection Time: 08/05/21 10:23 PM   Specimen: Right Antecubital; Blood  Result Value Ref Range Status   Specimen Description   Final    RIGHT ANTECUBITAL Performed at [facility] [address] [place], [place] [address]    Special Requests   Final    BOTTLES DRAWN AEROBIC AND ANAEROBIC Blood Culture adequate volume Performed at [facility] [address] [place], [place] [address]    Culture   Final    NO GROWTH 5 DAYS Performed at [facility], [place] [address], [place], [place] [address]    Report Status 08/11/2021 FINAL  Final  Urine Culture     Status: Abnormal   Collection Time: 08/05/21 10:33 PM   Specimen: Urine, Clean Catch  Result Value Ref Range Status   Specimen Description   Final    URINE, CLEAN CATCH Performed at [facility], [place] [address], [place], [place] [address]    Special Requests   Final    NONE Performed at [facility], [place] [address], [place], [place] [address]    Culture >=100,000 COLONIES/mL PROTEUS MIRABILIS (A)  Final   Report Status 08/08/2021 FINAL  Final   Organism ID, Bacteria PROTEUS MIRABILIS (A)  Final      Susceptibility   Proteus mirabilis - MIC*    AMPICILLIN <=2 SENSITIVE Sensitive     CEFAZOLIN <=4 SENSITIVE Sensitive     CEFEPIME <=0.12 SENSITIVE Sensitive     CEFTRIAXONE <=0.25 SENSITIVE Sensitive     CIPROFLOXACIN <=0.25 SENSITIVE Sensitive     GENTAMICIN <=1 SENSITIVE Sensitive     IMIPENEM 2  SENSITIVE Sensitive     NITROFURANTOIN 128 RESISTANT Resistant     TRIMETH/SULFA <=20 SENSITIVE Sensitive     AMPICILLIN/SULBACTAM <=2 SENSITIVE Sensitive     PIP/TAZO <=4 SENSITIVE Sensitive     * >=100,000 COLONIES/mL PROTEUS MIRABILIS  MRSA Next Gen by PCR, Nasal     Status: None   Collection Time: 08/07/21  1:19 PM   Specimen: Nasal Mucosa; Nasal Swab  Result Value Ref Range Status   MRSA by PCR Next Gen NOT DETECTED NOT DETECTED Final    Comment: (NOTE) The GeneXpert MRSA Assay (FDA approved for NASAL specimens only), is one component of a comprehensive MRSA colonization surveillance program. It is not intended to diagnose MRSA infection nor to guide or monitor treatment for MRSA infections. Test performance is not FDA approved in patients less than 89 years old. Performed at [facility], [place] [address], [place], [place] [address]   Culture, body fluid w Gram Stain-bottle     Status: None (Preliminary result)   Collection Time: 08/10/21  2:51 PM   Specimen: Pleura  Result Value Ref Range Status   Specimen Description PLEURAL FLUID  Final   Special Requests NONE  Final   Culture   Final    NO GROWTH < 12 HOURS Performed at [facility], [place] [address] [place], [place] [address]    Report Status PENDING  Incomplete    Procedures and diagnostic studies:  DG CHEST PORT  1 VIEW  Result Date: 08/10/2021 CLINICAL DATA:  Status post right thoracentesis. EXAM: PORTABLE CHEST 1 VIEW COMPARISON:  08/05/2021 FINDINGS: 1427 hours. Face obscures the lung apices. Within this limitation, no right-sided pneumothorax is visible. The cardio pericardial silhouette is enlarged. Similar retrocardiac left base collapse/consolidation with small left pleural effusion. Bones are diffusely demineralized. IMPRESSION: 1. No evidence for right-sided pneumothorax after thoracentesis. 2. Similar retrocardiac left base collapse/consolidation with small left pleural effusion.  Electronically Signed   By: Misty Stanley M.D.   On: 08/10/2021 15:19   US THORACENTESIS ASP PLEURAL SPACE W/IMG GUIDE  Result Date: 08/10/2021 INDICATION: Pneumonia, pleural effusion EXAM: ULTRASOUND GUIDED RIGHT THORACENTESIS MEDICATIONS: None. COMPLICATIONS: None immediate. PROCEDURE: An ultrasound guided thoracentesis was thoroughly discussed with the patient and questions answered. The benefits, risks, alternatives and complications were also discussed. The patient understands and wishes to proceed with the procedure. Written consent was obtained. Ultrasound was performed to localize and mark an adequate pocket of fluid in the right chest. The area was then prepped and draped in the normal sterile fashion. 1% Lidocaine was used for local anesthesia. Under ultrasound guidance a 6 Fr Safe-T-Centesis catheter was introduced. Thoracentesis was performed. The catheter was removed and a dressing applied. FINDINGS: A total of approximately 1L of amber pleural fluid was removed. Samples were sent to the laboratory as requested by the clinical team. IMPRESSION: Successful ultrasound guided right thoracentesis yielding 1L of pleural fluid. Performed and dictated by Pasty Spillers, PA-C Electronically Signed   By: Miachel Roux M.D.   On: 08/10/2021 14:51               LOS: 5 days   [place]  [facility]   Pager on [email or web address]. If 7PM-7AM, please contact night-coverage at [email or web address]     08/11/2021, 1:38 PM

## 2021-08-11 NOTE — Progress Notes (Signed)
ANTICOAGULATION CONSULT NOTE -  Pharmacy Consult for warfarin Indication: hx atrial fibrillation  Allergies  Allergen Reactions   Wheat Bran Shortness Of Breath   Kiwi Extract Other (See Comments)    Causes mouth sores   Orange Fruit [Citrus] Other (See Comments)    Causes mouth sores and "Allergic," per [facility]   Phenergan [Promethazine] Other (See Comments)    Disorientation and "Allergic," per [facility]    Pineapple Other (See Comments)    Causes mouth sores    Promethazine Hcl Other (See Comments)    Disorientation and "Allergic," per [facility]    Tomato Other (See Comments)    Causes mouth sores   Tylenol [Acetaminophen] Other (See Comments)    Causes fast heart rate and "Allergic," per [facility]   Vistaril [Hydroxyzine Hcl] Other (See Comments)    Confusion and "Allergic," per [facility]    Codeine Itching, Rash and Other (See Comments)    "Allergic," per [facility]   Tape Rash and Other (See Comments)    Blisters on skin- "Allergic," per [facility]    Patient Measurements: Height: 5\' 6"  (167.6 cm) Weight: 112 kg (246 lb 14.6 oz) IBW/kg (Calculated) : 59.3 Heparin Dosing Weight:   Vital Signs: Temp: 97.8 F (36.6 C) (02/13 1157) Temp Source: Oral (02/13 0430) BP: 91/73 (02/13 1157) Pulse Rate: 89 (02/13 1157)  Labs: Recent Labs    08/09/21 0451 08/09/21 0833 08/10/21 0457 08/11/21 0424  HGB  --  9.6*  --  9.7*  HCT  --  32.7*  --  33.0*  PLT  --  330  --  309  LABPROT 26.4*  --  29.4* 29.8*  INR 2.4*  --  2.8* 2.8*  CREATININE  --  0.34*  --  <0.30*     CrCl cannot be calculated (This lab value cannot be used to calculate CrCl because it is not a number: <0.30).   Medications:  - PTA warfarin regimen: 2.5 mg MTTSS with last dose on 2/5 per [facility] d/c on 2/6 ostensibly d/t elevated INR  Assessment: Patient is an 88 y.o F with hx afib on warfarin PTA, presented to the ED on 08/05/21 with AMS.  Pharmacy has been consulted to dose warfarin.  - INR was elevated on admission (2/7) at  4.5,  but pt was on doxycycline for wound and levaquin for PNA  Today, 08/11/2021: - INR remains in therapeutic range . Today INR Is 2.8 - Hemoglobin decreased slightly (last 2/11) - No bleeding documented  - drug-drug intxns: being on abx (currently on Augmentin) can make pt more sensitive to warfarin  Goal of Therapy:  INR 2-3 Monitor platelets by anticoagulation protocol: Yes   Plan:  - warfarin 1 mg dose today - daily INR - monitor for s/sx bleeding   Royetta Asal, PharmD, BCPS 08/11/2021 1:29 PM

## 2021-08-11 NOTE — Progress Notes (Signed)
Rapid Response Event Note   Reason for Call : Lethargy, not acting like baseline   Initial Focused Assessment:  On arrival pt's RN and NT in the room. Followed by 4th CN. Pt lungs sound diminshed in all bases but pt is moving air. Pt unable to follow commands.  Pt wearing 3L [place] and 94-97%. HR 110 while lying in bed. Temp 98.4 axillary. Last BS was 133 @ 2050p tonight.  Pt observed having apneic events causing O2 % to drop  In the chart, the daytime MD cited she was lethargic and having metabolic encephalopathy. The blood gas obtained @ 0842 had a CO2 60.3 and a bicarb of 35.5.      Interventions:  Blood gas ordered. CO2 now is 61.3 and bicarb 36.6.   Plan of Care:  BIPAP will be ordered  MD Notified: Gershon Cull, NP Call Time: [place] Time: 2320p End Time: [place], RN

## 2021-08-11 NOTE — Evaluation (Signed)
Occupational Therapy Evaluation Patient Details Name: Casey Patel MRN: [id number] DOB: Sep 22, 1929 Today's Date: 08/11/2021   History of Present Illness Casey Patel is a 88 y.o. female presents with decreased mentation and several episodes of emesis. Pt admitted 2/7 with healthcare-associated pneumonia. PMH: chronic atrial fibrillation, osteoarthritis, asthma, type II DM, hyperlipidemia, hypertension, history of pneumonia.   Clinical Impression   Patient is currently requiring TOTAL assistance with ALL ADLs at bed level.  Pt is not accepting PO intake. Pt required Total  assist of 2 people with bed mobility and is unable to perform functional transfers to toilet.  Current level of function may be below patient's typical baseline, but pt non-verbal this session and no family in room.  Per chart, pt was ambulating short distances prior to Jan 2023, but ADL performance unknown.  During this evaluation, patient was limited by generalized weakness, impaired activity tolerance, and severe confusion and lethargy as well as stiffness throughout trunk and inability to raise head even passively once EOB, all of which has the potential to impact patient's and caregiver's safety and independence during functional mobility, as well as performance for ADLs.  Patient lives at a facility.  Not much further information available.  Patient demonstrates guarded rehab potential, and should benefit from continued skilled occupational therapy services while in acute care to maximize safety, independence and quality of life at home.  Continued occupational therapy services recommendations below.  ?       Recommendations for follow up therapy are one component of a multi-disciplinary discharge planning process, led by the attending physician.  Recommendations may be updated based on patient status, additional functional criteria and insurance authorization.   Follow Up Recommendations  Other (comment) (Currently long term  custodial care without skilled OT at discharge is anticipated. If pt shows increased ability to participate with therapies will change recomemdnations to possible post-acute SNF <3 hours/day)    Assistance Recommended at Discharge    Patient can return home with the following Two people to help with bathing/dressing/bathroom;Two people to help with walking and/or transfers;Assistance with feeding;A lot of help with bathing/dressing/bathroom;A lot of help with walking and/or transfers    Functional Status Assessment  Patient has had a recent decline in their functional status and demonstrates the ability to make significant improvements in function in a reasonable and predictable amount of time.  Equipment Recommendations       Recommendations for Other Services       Precautions / Restrictions Precautions Precautions: Fall Precaution Comments: wound Restrictions Weight Bearing Restrictions: No      Mobility Bed Mobility Overal bed mobility: Needs Assistance Bed Mobility: Supine to Sit, Sit to Supine Rolling: Total assist   Supine to sit: Total assist, +2 for physical assistance Sit to supine: Total assist, +2 for physical assistance        Transfers                          Balance Overall balance assessment: Needs assistance Sitting-balance support: Feet supported, Single extremity supported Sitting balance-Leahy Scale: Poor Sitting balance - Comments: Brief moments of sitting EOB without external assist, but unable to maintain long.                                   ADL either performed or assessed with clinical judgement   ADL Overall ADL's : Needs assistance/impaired (Pt is fully  dependent with all ADLs and would not swallow when apple sauce placed to mouth despite cues.) Eating/Feeding: Total assistance;Bed level   Grooming: Total assistance;Bed level Grooming Details (indicate cue type and reason): to wash face Upper Body Bathing: Bed  level;Total assistance   Lower Body Bathing: Total assistance;Bed level;+2 for physical assistance   Upper Body Dressing : Total assistance;Bed level   Lower Body Dressing: Total assistance;Bed level;+2 for physical assistance     Toilet Transfer Details (indicate cue type and reason): Unable Toileting- Clothing Manipulation and Hygiene: Bed level;Total assistance;+2 for physical assistance       Functional mobility during ADLs: +2 for physical assistance;Total assistance       Vision   Additional Comments: Unable to assess. Pt had one short moment of eye contact with OT but absent tracking and absent pursuits.     Perception     Praxis      Pertinent Vitals/Pain Pain Assessment Pain Assessment: Faces Faces Pain Scale: No hurt     Hand Dominance Left (per chart)   Extremity/Trunk Assessment Upper Extremity Assessment Upper Extremity Assessment: RUE deficits/detail;LUE deficits/detail RUE Deficits / Details: Pt very hyphotic with head drop and very stiff through neck with inability to passively lift head while EOB. Due to this Bil UEs limited at shoulders. PROM at shoulders to ~80 degrees flexion/abduction, elbow-->Grip: WFL LUE Deficits / Details: Pt very hyphotic with head drop and very stiff through neck with inability to passively lift head while EOB. Due to this Bil UEs limited at shoulders. PROM at shoulders to ~80 degrees flexion/abduction, elbow-->Grip: [facility]       Cervical / Trunk Assessment Cervical / Trunk Assessment: Kyphotic   Communication Communication Communication: Other (comment) (Non-verbal for OT session.)   Cognition Arousal/Alertness: Lethargic Behavior During Therapy: Flat affect Overall Cognitive Status: No family/caregiver present to determine baseline cognitive functioning                                 General Comments: Pt lethargic and flat during eval, opened eyes once sitting EOB, but following no commnds with exception of  very light hand squeeze, and not speaking depsite encouragement.     General Comments       Exercises     Shoulder Instructions      Home Living Family/patient expects to be discharged to:: Skilled nursing facility                                 Additional Comments: per chart review, pt at rehab since 1/10 discharge, prior was ambulating with RW and receiving assist from sister who was caregiver      Prior Functioning/Environment Prior Level of Function : Patient poor historian/Family not available             Mobility Comments: Has been in the bed most of the time since discharge. Prior to recent hospital admission, pt was able to ambulate short distances with RW.          OT Problem List: Decreased strength;Decreased coordination;Decreased cognition;Decreased activity tolerance;Decreased range of motion;Decreased safety awareness;Impaired balance (sitting and/or standing);Decreased knowledge of use of DME or AE;Decreased knowledge of precautions;Impaired UE functional use      OT Treatment/Interventions: Self-care/ADL training;Therapeutic exercise;Therapeutic activities;Cognitive remediation/compensation;Patient/family education;Visual/perceptual remediation/compensation;DME and/or AE instruction;Manual therapy;Balance training    OT Goals(Current goals can be found in the care plan  section) Acute Rehab OT Goals OT Goal Formulation: Patient unable to participate in goal setting Time For Goal Achievement: 08/25/21 Potential to Achieve Goals: Poor (Guarded) ADL Goals Pt Will Perform Eating: with mod assist;bed level;sitting Pt Will Perform Grooming: with mod assist;bed level;sitting Additional ADL Goal #1: Pt will improve sitting balance to at least fair static to increase safety and participation during seated, simple ADLs or UE activities. Additional ADL Goal #2: Pt will cooperate ith and allow for assisted Upper body/trunk/cervical stretching and ROM in  order to increase UEs ability to assist with ADLs and functional mobility.  OT Frequency: Min 1X/week    Co-evaluation              AM-PAC OT "6 Clicks" Daily Activity     Outcome Measure Help from another person eating meals?: Total Help from another person taking care of personal grooming?: Total Help from another person toileting, which includes using toliet, bedpan, or urinal?: Total Help from another person bathing (including washing, rinsing, drying)?: Total Help from another person to put on and taking off regular upper body clothing?: Total Help from another person to put on and taking off regular lower body clothing?: Total 6 Click Score: 6   End of Session Equipment Utilized During Treatment: Oxygen Nurse Communication: Other (comment) (CNA assisted with all mobility)  Activity Tolerance: Patient tolerated treatment well Patient left: in bed;with call bell/phone within reach;with nursing/sitter in room (Air bed)  OT Visit Diagnosis: Other symptoms and signs involving cognitive function;Cognitive communication deficit (R41.841);Muscle weakness (generalized) (M62.81) Symptoms and signs involving cognitive functions: Other cerebrovascular disease                Time: 5449-2010 OT Time Calculation (min): 15 min Charges:  OT General Charges $OT Visit: 1 Visit OT Evaluation $OT Eval Low Complexity: [place], [place] Office: [phone] 08/11/2021  Julien Girt 08/11/2021, 10:00 AM

## 2021-08-12 ENCOUNTER — Ambulatory Visit: Payer: Medicare Other | Admitting: Cardiology

## 2021-08-12 LAB — CBC WITH DIFFERENTIAL/PLATELET
Abs Immature Granulocytes: 0.1 10*3/uL — ABNORMAL HIGH (ref 0.00–0.07)
Basophils Absolute: 0.1 10*3/uL (ref 0.0–0.1)
Basophils Relative: 0 %
Eosinophils Absolute: 0.3 10*3/uL (ref 0.0–0.5)
Eosinophils Relative: 2 %
HCT: 35 % — ABNORMAL LOW (ref 36.0–46.0)
Hemoglobin: 10.1 g/dL — ABNORMAL LOW (ref 12.0–15.0)
Immature Granulocytes: 1 %
Lymphocytes Relative: 5 %
Lymphs Abs: 0.8 10*3/uL (ref 0.7–4.0)
MCH: 26.4 pg (ref 26.0–34.0)
MCHC: 28.9 g/dL — ABNORMAL LOW (ref 30.0–36.0)
MCV: 91.4 fL (ref 80.0–100.0)
Monocytes Absolute: 0.9 10*3/uL (ref 0.1–1.0)
Monocytes Relative: 6 %
Neutro Abs: 13.1 10*3/uL — ABNORMAL HIGH (ref 1.7–7.7)
Neutrophils Relative %: 86 %
Platelets: 292 10*3/uL (ref 150–400)
RBC: 3.83 MIL/uL — ABNORMAL LOW (ref 3.87–5.11)
RDW: 17.6 % — ABNORMAL HIGH (ref 11.5–15.5)
WBC: 15.2 10*3/uL — ABNORMAL HIGH (ref 4.0–10.5)
nRBC: 0 % (ref 0.0–0.2)

## 2021-08-12 LAB — TSH: TSH: 2.639 u[IU]/mL (ref 0.350–4.500)

## 2021-08-12 LAB — GLUCOSE, CAPILLARY
Glucose-Capillary: 110 mg/dL — ABNORMAL HIGH (ref 70–99)
Glucose-Capillary: 95 mg/dL (ref 70–99)
Glucose-Capillary: 118 mg/dL — ABNORMAL HIGH (ref 70–99)
Glucose-Capillary: 106 mg/dL — ABNORMAL HIGH (ref 70–99)

## 2021-08-12 LAB — PROTIME-INR
INR: 2.2 — ABNORMAL HIGH (ref 0.8–1.2)
Prothrombin Time: 24.6 seconds — ABNORMAL HIGH (ref 11.4–15.2)

## 2021-08-12 LAB — AMMONIA: Ammonia: <10 umol/L (ref 9–35)

## 2021-08-12 MED ORDER — WARFARIN SODIUM 2.5 MG PO TABS
2.5000 mg | ORAL_TABLET | Freq: Once | ORAL | Status: AC
  Administered 2021-08-12: 2.5 mg via ORAL
  Filled 2021-08-12: qty 1

## 2021-08-12 NOTE — Progress Notes (Signed)
Notified Agricultural consultant, Rapid Response RN, and on call provider about patient's change in status. When nurse went to give patient their nighttime medications, patient would not speak but was alert enough to take medications. Patient did spit up some drink, which was administered after taking medications. Nurse used oral Yankauer at bedside to suction any oral secretions that might have been in the mouth. Very minimal secretions were suctioned and patient tolerated well. After giving medications and suctioning, patient went unresponsive but was breathing. Patient would open eyes occassionally but still would not speak. Rapid notified Respiratory Therapist, which per orders, Respiratory Therapist came and drew ABGs. Rapid Response and on call provider continued to monitor patient and put in new orders as appropriate.

## 2021-08-12 NOTE — Progress Notes (Signed)
Pt placed on bipap per NP, after abg showed elevated co2.

## 2021-08-12 NOTE — Progress Notes (Addendum)
°   08/12/21 0000  Assess: MEWS Score  Temp 98.4 F (36.9 C) (from temp taken at 2341 (08/11/21))  BP (!) 97/57  Pulse Rate 68  ECG Heart Rate 77  Resp 14  Level of Consciousness Responds to Pain  SpO2 93 %  O2 Device Nasal Cannula  O2 Flow Rate (L/min) 3 L/min  Assess: MEWS Score  MEWS Temp 0  MEWS Systolic 1  MEWS Pulse 0  MEWS RR 0  MEWS LOC 2  MEWS Score 3  MEWS Score Color Yellow  Assess: if the MEWS score is Yellow or Red  Were vital signs taken at a resting state? Yes  Focused Assessment Change from prior assessment (see assessment flowsheet)  Does the patient meet 2 or more of the SIRS criteria? No  MEWS guidelines implemented *See Row Information* Yes  Treat  MEWS Interventions Consulted Respiratory Therapy;Other (Comment) (contacted Camera operator, Rapid Response Nurse, and on call provider)  Pain Scale Faces  Faces Pain Scale 0  Neuro symptoms relieved by Rest  Take Vital Signs  Increase Vital Sign Frequency  Yellow: Q 2hr X 2 then Q 4hr X 2, if remains yellow, continue Q 4hrs  Escalate  MEWS: Escalate Yellow: discuss with charge nurse/RN and consider discussing with provider and RRT  Notify: Charge Nurse/RN  Name of Charge Nurse/RN Notified Karrie Doffing, RN  Date Charge Nurse/RN Notified 08/12/21  Time Charge Nurse/RN Notified 0045 (Charge Nurse was already aware of patient's change of status, but patient did not change yellow mews until 0000 and nurse was not aware until later)  Notify: Provider  Provider Name/Title J. Olena Heckle, NP  Date Provider Notified 08/11/21  Time Provider Notified 2326  Notification Type Page  Notification Reason Change in status  Provider response At bedside;See new orders  Date of Provider Response 08/11/21  Time of Provider Response 2328  Notify: Rapid Response  Name of Rapid Response RN Notified Barnet Glasgow, RN  Date Rapid Response Notified 08/11/21  Time Rapid Response Notified 2310  Document  Patient Outcome Other  (Comment)  Progress note created (see row info) Yes  Assess: SIRS CRITERIA  SIRS Temperature  0  SIRS Pulse 0  SIRS Respirations  0  SIRS WBC 0  SIRS Score Sum  0

## 2021-08-12 NOTE — Progress Notes (Signed)
Patient was placed on Bi-Pap by Respiratory Therapist. Patient was able to keep it on for close to 2 hours. The last 30 minutes or so patient kept trying to take off the Bi-Pap. Nurse took the Bi-Pap off and put patient back on her oxygen at 3L via nasal cannula. Patient was more alert and able to respond.

## 2021-08-12 NOTE — Care Management Important Message (Signed)
Important Message  Patient Details IM Letter placed in Patients room Name: Casey Patel MRN: [id number] Date of Birth: 04/30/1930   Medicare Important Message Given:  Yes     Kerin Salen 08/12/2021, 1:16 PM

## 2021-08-12 NOTE — Progress Notes (Signed)
PROGRESS NOTE    Jaidin Ugarte  WCB:[id number] DOB: 1929/08/10 DOA: 08/05/2021 PCP: [name], MD   Brief Narrative:   Lachelle Rissler is a 88 y.o. female with medical history significant of chronic atrial fibrillation on warfarin, osteoarthritis, asthma, type II DM, hyperlipidemia, hypertension, history of pneumonia, who presented to the hospital because of altered mental status and vomiting.  She has started treatment with doxycycline for pneumonia.   She was admitted to the hospital for pneumonia and acute metabolic encephalopathy.  Urinalysis was also suggestive of acute UTI.  She was treated with empiric IV antibiotics.  She underwent right-sided thoracentesis.  Right pleural effusion.  She now continues to struggle with some lethargy as well as hypercarbia and requires intermittent BiPAP.  Assessment & Plan:   Principal Problem:   CAP (community acquired pneumonia) Active Problems:   Hypertension   Hyperlipidemia   Type 2 diabetes mellitus (HCC)   Atrial fibrillation ([place])   Acute metabolic encephalopathy   Subtherapeutic international normalized ratio (INR)   Prolonged QT interval   Community-acquired pneumonia: Continue Augmentin, day 7.  Plan to DC after completing today's dose   Bilateral pleural effusion (moderate to large sized right and moderate-sized left): S/p right-sided thoracentesis with removal of 1 L of transudative fluid   UTI: Plan to DC Augmentin 2/14   Acute on chronic hypoxic respiratory failure, chronic hypercapnic respiratory failure: She is on 4 L/min oxygen.  She uses 2 L/min oxygen at home.  Taper down oxygen as able.  Acute metabolic encephalopathy, drowsiness: Occurred early this a.m. and required BiPAP which she has not been weaned off of.  Continues to have some intermittent somnolence.  She is not quite safe for discharge at this point and will require close monitoring with repeat of venous blood gas in a.m. we will add ammonia and TSH to  complete work-up.   Permanent atrial fibrillation: INR is therapeutic.  Continue warfarin and adjust dose accordingly   Prolonged QT interval (537): Avoid medications that prolong QT interval if possible.   Other comorbidities include hypertension, hyperlipidemia   DVT prophylaxis: Warfarin Code Status: DNR Family Communication: Discussed with sister on phone 2/14 Disposition Plan: Plan to discharge to SNF when she is able to go 24-48 hours without any worsening lethargy/AMS Status is: Inpatient Remains inpatient appropriate because: Continues to have periods of altered mentation and hypoxemia requiring BiPAP.   Skin Assessment:  I have examined the patients skin and I agree with the wound assessment as performed by the wound care RN as outlined below:  Pressure Injury 06/24/21 Sternum Upper;Mid Stage 3 -  Full thickness tissue loss. Subcutaneous fat may be visible but bone, tendon or muscle are NOT exposed. (Active)  06/24/21   Location: Sternum  Location Orientation: Upper;Mid  Staging: Stage 3 -  Full thickness tissue loss. Subcutaneous fat may be visible but bone, tendon or muscle are NOT exposed.  Wound Description (Comments):   Present on Admission: Yes     Pressure Injury 06/24/21 Jaw Mid Stage 2 -  Partial thickness loss of dermis presenting as a shallow open injury with a red, pink wound bed without slough. (Active)  06/24/21   Location: Jaw  Location Orientation: Mid  Staging: Stage 2 -  Partial thickness loss of dermis presenting as a shallow open injury with a red, pink wound bed without slough.  Wound Description (Comments):   Present on Admission: Yes     Pressure Injury 08/06/21 Buttocks Mid Stage 4 - Full thickness  tissue loss with exposed bone, tendon or muscle. Stage 4 pressure injury surrounded by full thickness wounds related to MASD (Active)  08/06/21 1901  Location: Buttocks  Location Orientation: Mid  Staging: Stage 4 - Full thickness tissue loss with  exposed bone, tendon or muscle.  Wound Description (Comments): Stage 4 pressure injury surrounded by full thickness wounds related to MASD  Present on Admission: Yes    Consultants:  None  Procedures:  See below  Antimicrobials:  Anti-infectives (From admission, onward)    Start     Dose/Rate Route Frequency Ordered Stop   08/10/21 1000  amoxicillin-clavulanate (AUGMENTIN) 875-125 MG per tablet 1 tablet        1 tablet Oral Every 12 hours 08/10/21 0751 08/13/21 0959   08/07/21 0400  vancomycin (VANCOREADY) IVPB 1500 mg/300 mL  Status:  Discontinued        1,500 mg 150 mL/hr over 120 Minutes Intravenous Every 36 hours 08/06/21 0500 08/09/21 1400   08/06/21 1400  ceFEPIme (MAXIPIME) 2 g in sodium chloride 0.9 % 100 mL IVPB  Status:  Discontinued        2 g 200 mL/hr over 30 Minutes Intravenous Every 12 hours 08/06/21 0500 08/10/21 0751   08/06/21 0215  vancomycin (VANCOCIN) IVPB 1000 mg/200 mL premix        1,000 mg 200 mL/hr over 60 Minutes Intravenous  Once 08/06/21 0208 08/06/21 0432   08/06/21 0215  ceFEPIme (MAXIPIME) 2 g in sodium chloride 0.9 % 100 mL IVPB        2 g 200 mL/hr over 30 Minutes Intravenous  Once 08/06/21 0208 08/06/21 0321      Subjective: Patient seen and evaluated today and remains intermittently somnolent, but is arousable to questioning.  Rapid response was called overnight with worsening lethargy and noted hypercapnia on ABG.  BiPAP was used with good result and she remains on 4 L nasal cannula this AM.  Objective: Vitals:   08/12/21 0141 08/12/21 0455 08/12/21 0740 08/12/21 0847  BP: 105/74 118/79 (!) 98/59   Pulse: 86 69 80   Resp: 16 18 16    Temp: 98 F (36.7 C) 98 F (36.7 C) 98 F (36.7 C)   TempSrc:  Axillary Oral   SpO2: 97% 97% 100% 98%  Weight:      Height:        Intake/Output Summary (Last 24 hours) at 08/12/2021 1003 Last data filed at 08/12/2021 0900 Gross per 24 hour  Intake 400.04 ml  Output 100 ml  Net 300.04 ml   Filed  Weights   08/11/21 0430  Weight: 112 kg    Examination:  General exam: Appears somnolent/lethargic Respiratory system: Clear to auscultation. Respiratory effort normal.  Currently on 4 L nasal cannula Cardiovascular system: S1 & S2 heard, RRR.  Gastrointestinal system: Abdomen is soft Central nervous system: Somnolent, but arousable Extremities: No edema Skin: No significant lesions noted Psychiatry: Flat affect.    Data Reviewed: I have personally reviewed following labs and imaging studies  CBC: Recent Labs  Lab 08/05/21 2140 08/06/21 1302 08/09/21 0833 08/11/21 0424 08/12/21 0459  WBC 10.8* 22.0* 11.5* 15.9* 15.2*  NEUTROABS  --   --  10.3* 14.0* 13.1*  HGB 10.0* 11.1* 9.6* 9.7* 10.1*  HCT 34.5* 38.3 32.7* 33.0* 35.0*  MCV 91.8 93.0 92.4 91.4 91.4  PLT 439* 450* 330 309 829   Basic Metabolic Panel: Recent Labs  Lab 08/05/21 2140 08/06/21 1302 08/09/21 0833 08/11/21 0424  NA 133* 135 137  136  K 4.8 4.7 4.0 4.2  CL 92* 93* 95* 96*  CO2 35* 33* 38* 34*  GLUCOSE 122* 142* 145* 123*  BUN 23 27* 28* 25*  CREATININE 0.39* 0.52 0.34* <0.30*  CALCIUM 8.0* 8.0* 8.3* 8.2*  MG  --   --  1.7  --   PHOS  --   --  2.7  --    GFR: CrCl cannot be calculated (This lab value cannot be used to calculate CrCl because it is not a number: <0.30). Liver Function Tests: Recent Labs  Lab 08/05/21 2140 08/11/21 0424  AST 14* 12*  ALT 18 14  ALKPHOS 89 74  BILITOT 0.6 0.4  PROT 5.7* 5.1*  ALBUMIN 2.4* 2.1*   No results for input(s): LIPASE, AMYLASE in the last 168 hours. No results for input(s): AMMONIA in the last 168 hours. Coagulation Profile: Recent Labs  Lab 08/08/21 0457 08/09/21 0451 08/10/21 0457 08/11/21 0424 08/12/21 0459  INR 3.6* 2.4* 2.8* 2.8* 2.2*   Cardiac Enzymes: No results for input(s): CKTOTAL, CKMB, CKMBINDEX, TROPONINI in the last 168 hours. BNP (last 3 results) No results for input(s): PROBNP in the last 8760 hours. HbA1C: No results  for input(s): HGBA1C in the last 72 hours. CBG: Recent Labs  Lab 08/11/21 0721 08/11/21 1152 08/11/21 1641 08/11/21 2050 08/12/21 0734  GLUCAP 123* 101* 123* 133* 110*   Lipid Profile: No results for input(s): CHOL, HDL, LDLCALC, TRIG, CHOLHDL, LDLDIRECT in the last 72 hours. Thyroid Function Tests: No results for input(s): TSH, T4TOTAL, FREET4, T3FREE, THYROIDAB in the last 72 hours. Anemia Panel: No results for input(s): VITAMINB12, FOLATE, FERRITIN, TIBC, IRON, RETICCTPCT in the last 72 hours. Sepsis Labs: Recent Labs  Lab 08/05/21 2223  LATICACIDVEN 0.9    Recent Results (from the past 240 hour(s))  Resp Panel by RT-PCR (Flu A&B, Covid) Nasopharyngeal Swab     Status: None   Collection Time: 08/05/21 10:19 PM   Specimen: Nasopharyngeal Swab; Nasopharyngeal(NP) swabs in vial transport medium  Result Value Ref Range Status   SARS Coronavirus 2 by RT PCR NEGATIVE NEGATIVE Final    Comment: (NOTE) SARS-CoV-2 target nucleic acids are NOT DETECTED.  The SARS-CoV-2 RNA is generally detectable in upper respiratory specimens during the acute phase of infection. The lowest concentration of SARS-CoV-2 viral copies this assay can detect is 138 copies/mL. A negative result does not preclude SARS-Cov-2 infection and should not be used as the sole basis for treatment or other patient management decisions. A negative result may occur with  improper specimen collection/handling, submission of specimen other than nasopharyngeal swab, presence of viral mutation(s) within the areas targeted by this assay, and inadequate number of viral copies(<138 copies/mL). A negative result must be combined with clinical observations, patient history, and epidemiological information. The expected result is Negative.  Fact Sheet for Patients:  EntrepreneurPulse.com.au  Fact Sheet for Healthcare Providers:  IncredibleEmployment.be  This test is no t yet approved  or cleared by the [place] FDA and  has been authorized for detection and/or diagnosis of SARS-CoV-2 by FDA under an Emergency Use Authorization (EUA). This EUA will remain  in effect (meaning this test can be used) for the duration of the COVID-19 declaration under Section 564(b)(1) of the Act, 21 U.S.C.section 360bbb-3(b)(1), unless the authorization is terminated  or revoked sooner.       Influenza A by PCR NEGATIVE NEGATIVE Final   Influenza B by PCR NEGATIVE NEGATIVE Final    Comment: (NOTE) The Xpert Xpress  SARS-CoV-2/FLU/RSV plus assay is intended as an aid in the diagnosis of influenza from Nasopharyngeal swab specimens and should not be used as a sole basis for treatment. Nasal washings and aspirates are unacceptable for Xpert Xpress SARS-CoV-2/FLU/RSV testing.  Fact Sheet for Patients: EntrepreneurPulse.com.au  Fact Sheet for Healthcare Providers: IncredibleEmployment.be  This test is not yet approved or cleared by the [place] FDA and has been authorized for detection and/or diagnosis of SARS-CoV-2 by FDA under an Emergency Use Authorization (EUA). This EUA will remain in effect (meaning this test can be used) for the duration of the COVID-19 declaration under Section 564(b)(1) of the Act, 21 U.S.C. section 360bbb-3(b)(1), unless the authorization is terminated or revoked.  Performed at [facility], [place] [address] [place], [place] [address]   Blood culture (routine x 2)     Status: None   Collection Time: 08/05/21 10:23 PM   Specimen: Right Antecubital; Blood  Result Value Ref Range Status   Specimen Description   Final    RIGHT ANTECUBITAL Performed at [place] [address] [place], [place] [address]    Special Requests   Final    BOTTLES DRAWN AEROBIC AND ANAEROBIC Blood Culture adequate volume Performed at [place] [address]  [place], [place] [address]    Culture   Final    NO GROWTH 5 DAYS Performed at [facility], [place] [address], [place], [place] [address]    Report Status 08/11/2021 FINAL  Final  Urine Culture     Status: Abnormal   Collection Time: 08/05/21 10:33 PM   Specimen: Urine, Clean Catch  Result Value Ref Range Status   Specimen Description   Final    URINE, CLEAN CATCH Performed at [facility], [place] [address] [place], [place] [address]    Special Requests   Final    NONE Performed at [facility], [place] [address] [place], [place] [address]    Culture >=100,000 COLONIES/mL PROTEUS MIRABILIS (A)  Final   Report Status 08/08/2021 FINAL  Final   Organism ID, Bacteria PROTEUS MIRABILIS (A)  Final      Susceptibility   Proteus mirabilis - MIC*    AMPICILLIN <=2 SENSITIVE Sensitive     CEFAZOLIN <=4 SENSITIVE Sensitive     CEFEPIME <=0.12 SENSITIVE Sensitive     CEFTRIAXONE <=0.25 SENSITIVE Sensitive     CIPROFLOXACIN <=0.25 SENSITIVE Sensitive     GENTAMICIN <=1 SENSITIVE Sensitive     IMIPENEM 2 SENSITIVE Sensitive     NITROFURANTOIN 128 RESISTANT Resistant     TRIMETH/SULFA <=20 SENSITIVE Sensitive     AMPICILLIN/SULBACTAM <=2 SENSITIVE Sensitive     PIP/TAZO <=4 SENSITIVE Sensitive     * >=100,000 COLONIES/mL PROTEUS MIRABILIS  MRSA Next Gen by PCR, Nasal     Status: None   Collection Time: 08/07/21  1:19 PM   Specimen: Nasal Mucosa; Nasal Swab  Result Value Ref Range Status   MRSA by PCR Next Gen NOT DETECTED NOT DETECTED Final    Comment: (NOTE) The GeneXpert MRSA Assay (FDA approved for NASAL specimens only), is one component of a comprehensive MRSA colonization surveillance program. It is not intended to diagnose MRSA infection nor to guide or monitor treatment for MRSA infections. Test performance is not FDA approved in patients less than 71 years old. Performed at [facility], [place] [address] [place], [place]  [address]   Culture, body fluid w Gram Stain-bottle  Status: None (Preliminary result)   Collection Time: 08/10/21  2:51 PM   Specimen: Pleura  Result Value Ref Range Status   Specimen Description PLEURAL FLUID  Final   Special Requests NONE  Final   Culture   Final    NO GROWTH 2 DAYS Performed at [facility], [address], [place], [place] [address]    Report Status PENDING  Incomplete  Gram stain     Status: None   Collection Time: 08/10/21  2:51 PM   Specimen: Pleura  Result Value Ref Range Status   Specimen Description PLEURAL FLUID  Final   Special Requests NONE  Final   Gram Stain   Final    WBC PRESENT, PREDOMINANTLY PMN NO ORGANISMS SEEN CYTOSPIN SMEAR Performed at [facility], [place] [address], [place], [place] [address]    Report Status 08/11/2021 FINAL  Final         Radiology Studies: DG CHEST PORT 1 VIEW  Result Date: 08/10/2021 CLINICAL DATA:  Status post right thoracentesis. EXAM: PORTABLE CHEST 1 VIEW COMPARISON:  08/05/2021 FINDINGS: 1427 hours. Face obscures the lung apices. Within this limitation, no right-sided pneumothorax is visible. The cardio pericardial silhouette is enlarged. Similar retrocardiac left base collapse/consolidation with small left pleural effusion. Bones are diffusely demineralized. IMPRESSION: 1. No evidence for right-sided pneumothorax after thoracentesis. 2. Similar retrocardiac left base collapse/consolidation with small left pleural effusion. Electronically Signed   By: Misty Stanley M.D.   On: 08/10/2021 15:19   US THORACENTESIS ASP PLEURAL SPACE W/IMG GUIDE  Result Date: 08/10/2021 INDICATION: Pneumonia, pleural effusion EXAM: ULTRASOUND GUIDED RIGHT THORACENTESIS MEDICATIONS: None. COMPLICATIONS: None immediate. PROCEDURE: An ultrasound guided thoracentesis was thoroughly discussed with the patient and questions answered. The benefits, risks, alternatives and complications were also discussed. The patient understands  and wishes to proceed with the procedure. Written consent was obtained. Ultrasound was performed to localize and mark an adequate pocket of fluid in the right chest. The area was then prepped and draped in the normal sterile fashion. 1% Lidocaine was used for local anesthesia. Under ultrasound guidance a 6 Fr Safe-T-Centesis catheter was introduced. Thoracentesis was performed. The catheter was removed and a dressing applied. FINDINGS: A total of approximately 1L of amber pleural fluid was removed. Samples were sent to the laboratory as requested by the clinical team. IMPRESSION: Successful ultrasound guided right thoracentesis yielding 1L of pleural fluid. Performed and dictated by Pasty Spillers, PA-C Electronically Signed   By: Miachel Roux M.D.   On: 08/10/2021 14:51        Scheduled Meds:  amoxicillin-clavulanate  1 tablet Oral Q12H   vitamin C  1,000 mg Oral Daily   cholecalciferol  2,000 Units Oral Daily   digoxin  0.125 mg Oral Daily   feeding supplement (GLUCERNA SHAKE)  237 mL Oral BID BM   fluticasone furoate-vilanterol  1 puff Inhalation QHS   insulin aspart  0-9 Units Subcutaneous TID WC   loratadine  10 mg Oral Daily   mouth rinse  15 mL Mouth Rinse BID   metFORMIN  500 mg Oral BID WC   metoprolol succinate  100 mg Oral BID   multivitamin with minerals  1 tablet Oral BID PC   nutrition supplement (JUVEN)  1 packet Oral BID WC   pantoprazole  40 mg Oral Daily   Warfarin - Pharmacist Dosing Inpatient   Does not apply q1600   Continuous Infusions:  sodium chloride 10 mL/hr at 08/10/21 1312  LOS: 6 days    Time spent: 35 minutes    Deronda Christian Darleen Crocker, DO [facility]  If 7PM-7AM, please contact night-coverage [email or web address] 08/12/2021, 10:03 AM

## 2021-08-12 NOTE — Plan of Care (Signed)
  Problem: Pain Managment: Goal: General experience of comfort will improve Outcome: Progressing   Problem: Safety: Goal: Ability to remain free from injury will improve Outcome: Progressing   Problem: Skin Integrity: Goal: Risk for impaired skin integrity will decrease Outcome: Progressing   

## 2021-08-12 NOTE — Progress Notes (Signed)
PT Cancellation Note  Patient Details Name: Casey Patel MRN: [id number] DOB: [birth year]   Cancelled Treatment:    Reason Eval/Treat Not Completed: Fatigue/lethargy limiting ability to participate Lethargy and on/off BiPAP   Kati L Payson 08/12/2021, 11:30 AM Arlyce Dice, DPT Acute Rehabilitation Services Pager: [phone] Office: [phone]

## 2021-08-12 NOTE — Plan of Care (Signed)
°  Problem: Coping: °Goal: Level of anxiety will decrease °Outcome: Progressing °  °

## 2021-08-12 NOTE — Progress Notes (Signed)
ANTICOAGULATION CONSULT NOTE -  Pharmacy Consult for warfarin Indication: hx atrial fibrillation  Allergies  Allergen Reactions   Wheat Bran Shortness Of Breath   Kiwi Extract Other (See Comments)    Causes mouth sores   Orange Fruit [Citrus] Other (See Comments)    Causes mouth sores and "Allergic," per [facility]   Phenergan [Promethazine] Other (See Comments)    Disorientation and "Allergic," per [facility]    Pineapple Other (See Comments)    Causes mouth sores    Promethazine Hcl Other (See Comments)    Disorientation and "Allergic," per [facility]    Tomato Other (See Comments)    Causes mouth sores   Tylenol [Acetaminophen] Other (See Comments)    Causes fast heart rate and "Allergic," per [facility]   Vistaril [Hydroxyzine Hcl] Other (See Comments)    Confusion and "Allergic," per [facility]    Codeine Itching, Rash and Other (See Comments)    "Allergic," per [facility]   Tape Rash and Other (See Comments)    Blisters on skin- "Allergic," per [facility]    Patient Measurements: Height: 5\' 6"  (167.6 cm) Weight: 112 kg (246 lb 14.6 oz) IBW/kg (Calculated) : 59.3 Heparin Dosing Weight:   Vital Signs: Temp: 98 F (36.7 C) (02/14 0740) Temp Source: Oral (02/14 0740) BP: 98/56 (02/14 1323) Pulse Rate: 80 (02/14 0740)  Labs: Recent Labs    08/10/21 0457 08/11/21 0424 08/12/21 0459  HGB  --  9.7* 10.1*  HCT  --  33.0* 35.0*  PLT  --  309 292  LABPROT 29.4* 29.8* 24.6*  INR 2.8* 2.8* 2.2*  CREATININE  --  <0.30*  --      CrCl cannot be calculated (This lab value cannot be used to calculate CrCl because it is not a number: <0.30).   Medications:  - PTA warfarin regimen: 2.5 mg MTTSS with last dose on 2/5 per [facility] d/c on 2/6 ostensibly d/t elevated INR  Assessment: Patient is an 88 y.o F with hx afib on warfarin PTA, presented to the ED on 08/05/21 with AMS.  Pharmacy has been consulted to dose warfarin.  - INR was elevated on admission (2/7) at  4.5, but pt was on doxycycline for wound and  levaquin for PNA  Today, 08/12/2021: - INR remains in therapeutic range . Today INR Is 2.2  - Hemoglobin low but stable  - No bleeding documented  - drug-drug intxns: being on abx (currently on Augmentin) can make pt more sensitive to warfarin >> will be stopped after todays doses   Goal of Therapy:  INR 2-3 Monitor platelets by anticoagulation protocol: Yes   Plan:  - warfarin 2.5 mg dose today - daily INR - monitor for s/sx bleeding   Royetta Asal, PharmD, BCPS 08/12/2021 1:32 PM

## 2021-08-12 NOTE — Progress Notes (Signed)
° ° °  OVERNIGHT PROGRESS REPORT  Brief overview:  Patient has a known history of chronic A-fib on warfarin, osteoarthritis, asthma, DM 2, hyperlipidemia, hypertension, history of pneumonia.  She was admitted to the hospital for pneumonia and acute metabolic encephalopathy. Possible UTI also. She was started on IV antibiotics and has had right-sided thoracentesis.  Overnight: Notified by RN for rapid response due to wax and wane of ability to follow commands with RN. Daytime physician noted lethargy and metabolic encephalopathy prior blood gas yielded CO2 of 60.3, current blood gas completed showing CO2 61.3 mmHg. Patient will open her eyes occasionally but will not follow commands. SPO2 is 91 to 93% on 3 L [place].  BiPAP as needed is ordered and will be started.  CBG is within range. Patient shows some short duration periods of apnea with maintained SPO2.  Gershon Cull MSNA MSN ACNPC-AG Acute Care Nurse Practitioner [place]

## 2021-08-13 DIAGNOSIS — J9621 Acute and chronic respiratory failure with hypoxia: Secondary | ICD-10-CM | POA: Insufficient documentation

## 2021-08-13 DIAGNOSIS — J9612 Chronic respiratory failure with hypercapnia: Secondary | ICD-10-CM

## 2021-08-13 DIAGNOSIS — J90 Pleural effusion, not elsewhere classified: Secondary | ICD-10-CM

## 2021-08-13 DIAGNOSIS — J9601 Acute respiratory failure with hypoxia: Secondary | ICD-10-CM

## 2021-08-13 DIAGNOSIS — I10 Essential (primary) hypertension: Secondary | ICD-10-CM

## 2021-08-13 LAB — PROTIME-INR
INR: 2.1 — ABNORMAL HIGH (ref 0.8–1.2)
Prothrombin Time: 23.1 seconds — ABNORMAL HIGH (ref 11.4–15.2)

## 2021-08-13 LAB — MAGNESIUM: Magnesium: 1.6 mg/dL — ABNORMAL LOW (ref 1.7–2.4)

## 2021-08-13 LAB — BASIC METABOLIC PANEL
Anion gap: 5 (ref 5–15)
BUN: 20 mg/dL (ref 8–23)
CO2: 35 mmol/L — ABNORMAL HIGH (ref 22–32)
Calcium: 8 mg/dL — ABNORMAL LOW (ref 8.9–10.3)
Chloride: 99 mmol/L (ref 98–111)
Creatinine, Ser: <0.3 mg/dL — ABNORMAL LOW (ref 0.44–1.00)
Glucose, Bld: 99 mg/dL (ref 70–99)
Potassium: 4.1 mmol/L (ref 3.5–5.1)
Sodium: 139 mmol/L (ref 135–145)

## 2021-08-13 LAB — BLOOD GAS, VENOUS
Acid-Base Excess: 13 mmol/L — ABNORMAL HIGH (ref 0.0–2.0)
Bicarbonate: 40.9 mmol/L — ABNORMAL HIGH (ref 20.0–28.0)
O2 Saturation: 69.8 %
Patient temperature: 37
pCO2, Ven: 74 mmHg (ref 44–60)
pH, Ven: 7.35 (ref 7.25–7.43)
pO2, Ven: 42 mmHg (ref 32–45)

## 2021-08-13 LAB — CBC
HCT: 33.9 % — ABNORMAL LOW (ref 36.0–46.0)
Hemoglobin: 9.8 g/dL — ABNORMAL LOW (ref 12.0–15.0)
MCH: 26.7 pg (ref 26.0–34.0)
MCHC: 28.9 g/dL — ABNORMAL LOW (ref 30.0–36.0)
MCV: 92.4 fL (ref 80.0–100.0)
Platelets: 258 10*3/uL (ref 150–400)
RBC: 3.67 MIL/uL — ABNORMAL LOW (ref 3.87–5.11)
RDW: 17.3 % — ABNORMAL HIGH (ref 11.5–15.5)
WBC: 13.5 10*3/uL — ABNORMAL HIGH (ref 4.0–10.5)
nRBC: 0 % (ref 0.0–0.2)

## 2021-08-13 LAB — GLUCOSE, CAPILLARY
Glucose-Capillary: 90 mg/dL (ref 70–99)
Glucose-Capillary: 90 mg/dL (ref 70–99)
Glucose-Capillary: 119 mg/dL — ABNORMAL HIGH (ref 70–99)
Glucose-Capillary: 117 mg/dL — ABNORMAL HIGH (ref 70–99)

## 2021-08-13 LAB — CYTOLOGY - NON PAP

## 2021-08-13 MED ORDER — FLUTICASONE FUROATE-VILANTEROL 100-25 MCG/ACT IN AEPB
1.0000 | INHALATION_SPRAY | Freq: Every day | RESPIRATORY_TRACT | Status: DC
  Administered 2021-08-13: 1 via RESPIRATORY_TRACT

## 2021-08-13 MED ORDER — WARFARIN SODIUM 2.5 MG PO TABS
2.5000 mg | ORAL_TABLET | Freq: Once | ORAL | Status: AC
  Administered 2021-08-13: 2.5 mg via ORAL
  Filled 2021-08-13: qty 1

## 2021-08-13 MED ORDER — FUROSEMIDE 10 MG/ML IJ SOLN: 40.0000 mg | Freq: Once | INTRAMUSCULAR | Status: DC

## 2021-08-13 NOTE — Progress Notes (Signed)
Occupational Therapy Treatment Patient Details Name: Casey Patel MRN: [id number] DOB: 04/12/1930 Today's Date: 08/13/2021   History of present illness Casey Patel is a 88 y.o. female presents with decreased mentation and several episodes of emesis. Pt admitted 2/7 with healthcare-associated pneumonia. PMH: chronic atrial fibrillation, osteoarthritis, asthma, type II DM, hyperlipidemia, hypertension, history of pneumonia.   OT comments  Treatment focused on UE exercise and grooming task in bed. Patient able to perform both. Patient able to wipe her face. She is alert and able to answer questions and keep count of her exercise reps. She is not interested in drinking or eating however. She was able to tolerate therapy and asked for a hug before therapist left.    Recommendations for follow up therapy are one component of a multi-disciplinary discharge planning process, led by the attending physician.  Recommendations may be updated based on patient status, additional functional criteria and insurance authorization.    Follow Up Recommendations  Skilled nursing-short term rehab (<3 hours/day)    Assistance Recommended at Discharge    Patient can return home with the following  Two people to help with bathing/dressing/bathroom;Two people to help with walking and/or transfers;Assistance with feeding;A lot of help with bathing/dressing/bathroom;A lot of help with walking and/or transfers   Equipment Recommendations       Recommendations for Other Services      Precautions / Restrictions Precautions Precautions: Fall Precaution Comments: wound Restrictions Weight Bearing Restrictions: No        ADL either performed or assessed with clinical judgement   ADL       Grooming: Set up;Wash/dry face Grooming Details (indicate cue type and reason): Patient able to wash her face and clear her eyes with set up of warm wash cloth                                       Cognition Arousal/Alertness: Awake/alert Behavior During Therapy: WFL for tasks assessed/performed Overall Cognitive Status: Within Functional Limits for tasks assessed                                          Exercises Other Exercises Other Exercises: elbow flexion x 10 each arm, shoulder flexion x 10 each arm            Pertinent Vitals/ Pain       Pain Assessment Pain Assessment: No/denies pain   Frequency  Min 1X/week        Progress Toward Goals  OT Goals(current goals can now be found in the care plan section)  Progress towards OT goals: Progressing toward goals  Acute Rehab OT Goals Time For Goal Achievement: 08/25/21 Potential to Achieve Goals: Good  Plan Discharge plan remains appropriate    Co-evaluation                 AM-PAC OT "6 Clicks" Daily Activity     Outcome Measure   Help from another person eating meals?: A Lot Help from another person taking care of personal grooming?: A Little Help from another person toileting, which includes using toliet, bedpan, or urinal?: Total Help from another person bathing (including washing, rinsing, drying)?: Total Help from another person to put on and taking off regular upper body clothing?: A Lot Help from another person to put on and  taking off regular lower body clothing?: Total 6 Click Score: 10    End of Session    OT Visit Diagnosis: Other symptoms and signs involving cognitive function;Cognitive communication deficit (R41.841);Muscle weakness (generalized) (M62.81) Symptoms and signs involving cognitive functions: Other cerebrovascular disease   Activity Tolerance Patient tolerated treatment well   Patient Left in bed;with call bell/phone within reach   Nurse Communication          Time: 8270-7867 OT Time Calculation (min): 14 min  Charges: OT General Charges $OT Visit: 1 Visit OT Treatments $Therapeutic Exercise: 8-22 mins  Derl Barrow, OTR/L [place]  Office [phone] Pager: [place] 08/13/2021, 2:12 PM

## 2021-08-13 NOTE — Assessment & Plan Note (Signed)
Proteus mirabilis on urine culture (2/7). Patient treated with Augmentin.

## 2021-08-13 NOTE — Progress Notes (Addendum)
PROGRESS NOTE    Casey Patel  BWL:[id number] DOB: 09-Feb-1930 DOA: 08/05/2021 PCP: [name], MD   Brief Narrative: Casey Patel is a 88 y.o. female with a history of chronic atrial fibrillation on warfarin, osteoarthritis, asthma, diabetes mellitus type 2, hyperlipidemia and hypertension. Patient presented secondary to worsening mental status and vomiting with concern for pneumonia. She was started no empiric antibiotics. Urine culture suggestive of a UTI. Imaging also suggestive of bilateral pleural effusions; she is s/p a right thoracentesis.   Assessment and Plan: * CAP (community acquired pneumonia)- (present on admission) Chest imaging significant for bilateral pleural effusions with pneumonia unable to be excluded. Patient started on Vancomycin and Cefepime empirically with completion of treatment.  Chronic respiratory failure with hypercapnia (HCC) Possibly related to kyphosis and restrictive lung as a result. Patient has been treated intermittently with BiPAP but does not tolerate it for very long. Discussed this with patient's sister who voices that patient is more in line with less is more. -BiPAP qhs if patient tolerates  Acute respiratory failure with hypoxia (HCC) Secondary to pneumonia/pleural effusions. Stable on 4 L/min -Wean to room air if able  Bilateral pleural effusion Patient underwent right sided thoracentesis of 2/12 yielding about 1 L of transudate fluid. Cytology with atypical cells. Fluid culture with no growth.  UTI (urinary tract infection)- (present on admission) Proteus mirabilis on urine culture (2/7). Patient treated with Augmentin.  Pressure injury of skin- (present on admission) Mid-buttocks.  Atrial fibrillation ([place])- (present on admission) -Continue Warfarin, metoprolol and digoxin  Type 2 diabetes mellitus (HCC) -Continue SSI and metformin  Hypertension- (present on admission) -Continue Toprol XL     DVT prophylaxis:  Warfarin Code Status:   Code Status: DNR Family Communication: Sister on telephone Disposition Plan: Discharge back to SNF likely tomorrow   Consultants:  Palliative care medicine  Procedures:  RIGHT THORACENTESIS (08/10/2021)  Antimicrobials: Vancomycin Cefepime Augmentin    Subjective: Patient without issues this morning.  Objective: Vitals:   08/13/21 0453 08/13/21 0803 08/13/21 0924 08/13/21 1442  BP: 96/62  109/71 101/81  Pulse: 71  83 84  Resp: 18  16 14   Temp: 98.5 F (36.9 C)   97.7 F (36.5 C)  TempSrc: Oral   Axillary  SpO2: 94% 100% 100% 100%  Weight:      Height:        Intake/Output Summary (Last 24 hours) at 08/13/2021 1836 Last data filed at 08/13/2021 1800 Gross per 24 hour  Intake 448.58 ml  Output 200 ml  Net 248.58 ml   Filed Weights   08/11/21 0430  Weight: 112 kg    Examination:  General exam: Appears calm and comfortable. Chronically ill appearing. HEENT: Dry mucous membranes Respiratory system: Diminished. Respiratory effort normal. Cardiovascular system: S1 & S2 heard, RRR. No murmurs, rubs, gallops or clicks. Gastrointestinal system: Abdomen is nondistended, soft and nontender. No organomegaly or masses felt. Normal bowel sounds heard. Central nervous system: Slightly somnolent but arouses easily and oriented to at least self and place. Difficult to get her to answer all questions. Appears to have general weakness but attempts to follow commands Musculoskeletal: Bilateral LE 2+ pitting edema up to thighs. No calf tenderness Skin: No cyanosis. No rashes    Data Reviewed: I have personally reviewed following labs and imaging studies  CBC Lab Results  Component Value Date   WBC 13.5 (H) 08/13/2021   RBC 3.67 (L) 08/13/2021   HGB 9.8 (L) 08/13/2021   HCT 33.9 (L) 08/13/2021  MCV 92.4 08/13/2021   MCH 26.7 08/13/2021   PLT 258 08/13/2021   MCHC 28.9 (L) 08/13/2021   RDW 17.3 (H) 08/13/2021   LYMPHSABS 0.8 08/12/2021    MONOABS 0.9 08/12/2021   EOSABS 0.3 08/12/2021   BASOSABS 0.1 16/03/9603     Last metabolic panel Lab Results  Component Value Date   NA 139 08/13/2021   K 4.1 08/13/2021   CL 99 08/13/2021   CO2 35 (H) 08/13/2021   BUN 20 08/13/2021   CREATININE <0.30 (L) 08/13/2021   GLUCOSE 99 08/13/2021   GFRNONAA NOT CALCULATED 08/13/2021   GFRAA >60 03/10/2018   CALCIUM 8.0 (L) 08/13/2021   PHOS 2.7 08/09/2021   PROT 5.1 (L) 08/11/2021   ALBUMIN 2.1 (L) 08/11/2021   BILITOT 0.4 08/11/2021   ALKPHOS 74 08/11/2021   AST 12 (L) 08/11/2021   ALT 14 08/11/2021   ANIONGAP 5 08/13/2021    CBG (last 3)  Recent Labs    08/13/21 0730 08/13/21 1155 08/13/21 1637  GLUCAP 90 90 119*     GFR: CrCl cannot be calculated (This lab value cannot be used to calculate CrCl because it is not a number: <0.30).  Coagulation Profile: Recent Labs  Lab 08/09/21 0451 08/10/21 0457 08/11/21 0424 08/12/21 0459 08/13/21 0524  INR 2.4* 2.8* 2.8* 2.2* 2.1*    Recent Results (from the past 240 hour(s))  Resp Panel by RT-PCR (Flu A&B, Covid) Nasopharyngeal Swab     Status: None   Collection Time: 08/05/21 10:19 PM   Specimen: Nasopharyngeal Swab; Nasopharyngeal(NP) swabs in vial transport medium  Result Value Ref Range Status   SARS Coronavirus 2 by RT PCR NEGATIVE NEGATIVE Final    Comment: (NOTE) SARS-CoV-2 target nucleic acids are NOT DETECTED.  The SARS-CoV-2 RNA is generally detectable in upper respiratory specimens during the acute phase of infection. The lowest concentration of SARS-CoV-2 viral copies this assay can detect is 138 copies/mL. A negative result does not preclude SARS-Cov-2 infection and should not be used as the sole basis for treatment or other patient management decisions. A negative result may occur with  improper specimen collection/handling, submission of specimen other than nasopharyngeal swab, presence of viral mutation(s) within the areas targeted by this assay,  and inadequate number of viral copies(<138 copies/mL). A negative result must be combined with clinical observations, patient history, and epidemiological information. The expected result is Negative.  Fact Sheet for Patients:  EntrepreneurPulse.com.au  Fact Sheet for Healthcare Providers:  IncredibleEmployment.be  This test is no t yet approved or cleared by the [place] FDA and  has been authorized for detection and/or diagnosis of SARS-CoV-2 by FDA under an Emergency Use Authorization (EUA). This EUA will remain  in effect (meaning this test can be used) for the duration of the COVID-19 declaration under Section 564(b)(1) of the Act, 21 U.S.C.section 360bbb-3(b)(1), unless the authorization is terminated  or revoked sooner.       Influenza A by PCR NEGATIVE NEGATIVE Final   Influenza B by PCR NEGATIVE NEGATIVE Final    Comment: (NOTE) The Xpert Xpress SARS-CoV-2/FLU/RSV plus assay is intended as an aid in the diagnosis of influenza from Nasopharyngeal swab specimens and should not be used as a sole basis for treatment. Nasal washings and aspirates are unacceptable for Xpert Xpress SARS-CoV-2/FLU/RSV testing.  Fact Sheet for Patients: EntrepreneurPulse.com.au  Fact Sheet for Healthcare Providers: IncredibleEmployment.be  This test is not yet approved or cleared by the [place] FDA and has been authorized for detection  and/or diagnosis of SARS-CoV-2 by FDA under an Emergency Use Authorization (EUA). This EUA will remain in effect (meaning this test can be used) for the duration of the COVID-19 declaration under Section 564(b)(1) of the Act, 21 U.S.C. section 360bbb-3(b)(1), unless the authorization is terminated or revoked.  Performed at [facility], [place] [address] [place], [place] [address]   Blood culture (routine x 2)     Status: None   Collection Time: 08/05/21  10:23 PM   Specimen: Right Antecubital; Blood  Result Value Ref Range Status   Specimen Description   Final    RIGHT ANTECUBITAL Performed at [place] [address], [place], [place] [address]    Special Requests   Final    BOTTLES DRAWN AEROBIC AND ANAEROBIC Blood Culture adequate volume Performed at [place] [address], [place], [place] [address]    Culture   Final    NO GROWTH 5 DAYS Performed at [facility], [place] [address], [place], [place] [address]    Report Status 08/11/2021 FINAL  Final  Urine Culture     Status: Abnormal   Collection Time: 08/05/21 10:33 PM   Specimen: Urine, Clean Catch  Result Value Ref Range Status   Specimen Description   Final    URINE, CLEAN CATCH Performed at [facility], [place] [address] [place], [place] [address]    Special Requests   Final    NONE Performed at [facility], [place] [address], [place], [place] [address]    Culture >=100,000 COLONIES/mL PROTEUS MIRABILIS (A)  Final   Report Status 08/08/2021 FINAL  Final   Organism ID, Bacteria PROTEUS MIRABILIS (A)  Final      Susceptibility   Proteus mirabilis - MIC*    AMPICILLIN <=2 SENSITIVE Sensitive     CEFAZOLIN <=4 SENSITIVE Sensitive     CEFEPIME <=0.12 SENSITIVE Sensitive     CEFTRIAXONE <=0.25 SENSITIVE Sensitive     CIPROFLOXACIN <=0.25 SENSITIVE Sensitive     GENTAMICIN <=1 SENSITIVE Sensitive     IMIPENEM 2 SENSITIVE Sensitive     NITROFURANTOIN 128 RESISTANT Resistant     TRIMETH/SULFA <=20 SENSITIVE Sensitive     AMPICILLIN/SULBACTAM <=2 SENSITIVE Sensitive     PIP/TAZO <=4 SENSITIVE Sensitive     * >=100,000 COLONIES/mL PROTEUS MIRABILIS  MRSA Next Gen by PCR, Nasal     Status: None   Collection Time: 08/07/21  1:19 PM   Specimen: Nasal Mucosa; Nasal Swab  Result Value Ref Range Status   MRSA by PCR Next Gen NOT DETECTED NOT DETECTED Final    Comment: (NOTE) The GeneXpert  MRSA Assay (FDA approved for NASAL specimens only), is one component of a comprehensive MRSA colonization surveillance program. It is not intended to diagnose MRSA infection nor to guide or monitor treatment for MRSA infections. Test performance is not FDA approved in patients less than 75 years old. Performed at [facility], [place] [address], [place], [place] [address]   Culture, body fluid w Gram Stain-bottle     Status: None (Preliminary result)   Collection Time: 08/10/21  2:51 PM   Specimen: Pleura  Result Value Ref Range Status   Specimen Description PLEURAL FLUID  Final   Special Requests NONE  Final   Culture   Final    NO GROWTH 3 DAYS Performed at [place] [address] [place], [place] [address]    Report Status  PENDING  Incomplete  Gram stain     Status: None   Collection Time: 08/10/21  2:51 PM   Specimen: Pleura  Result Value Ref Range Status   Specimen Description PLEURAL FLUID  Final   Special Requests NONE  Final   Gram Stain   Final    WBC PRESENT, PREDOMINANTLY PMN NO ORGANISMS SEEN CYTOSPIN SMEAR Performed at [facility], [address], [place], [place] [address]    Report Status 08/11/2021 FINAL  Final        Radiology Studies: No results found.      Scheduled Meds:  vitamin C  1,000 mg Oral Daily   cholecalciferol  2,000 Units Oral Daily   digoxin  0.125 mg Oral Daily   feeding supplement (GLUCERNA SHAKE)  237 mL Oral BID BM   fluticasone furoate-vilanterol  1 puff Inhalation QHS   insulin aspart  0-9 Units Subcutaneous TID WC   loratadine  10 mg Oral Daily   mouth rinse  15 mL Mouth Rinse BID   metFORMIN  500 mg Oral BID WC   metoprolol succinate  100 mg Oral BID   multivitamin with minerals  1 tablet Oral BID PC   nutrition supplement (JUVEN)  1 packet Oral BID WC   pantoprazole  40 mg Oral Daily   Warfarin - Pharmacist Dosing Inpatient   Does not apply q1600   Continuous Infusions:  sodium  chloride 10 mL/hr at 08/10/21 1312     LOS: 7 days     Cordelia Poche, MD [facility] 08/13/2021, 6:36 PM  If 7PM-7AM, please contact night-coverage [email or web address]

## 2021-08-13 NOTE — Progress Notes (Incomplete)
PROGRESS NOTE    Casey Patel  LEX:[id number] DOB: [birth year] DOA: 08/05/2021 PCP: [name], MD   Brief Narrative: No notes on file   Assessment & Plan:   Assessment and Plan: No notes have been filed under this hospital service. Service: Hospitalist        ----- Pressure Injury 06/24/21 Sternum Upper;Mid Stage 3 -  Full thickness tissue loss. Subcutaneous fat may be visible but bone, tendon or muscle are NOT exposed. (Active)  06/24/21   Location: Sternum  Location Orientation: Upper;Mid  Staging: Stage 3 -  Full thickness tissue loss. Subcutaneous fat may be visible but bone, tendon or muscle are NOT exposed.  Wound Description (Comments):   Present on Admission: Yes     Pressure Injury 06/24/21 Jaw Mid Stage 2 -  Partial thickness loss of dermis presenting as a shallow open injury with a red, pink wound bed without slough. (Active)  06/24/21   Location: Jaw  Location Orientation: Mid  Staging: Stage 2 -  Partial thickness loss of dermis presenting as a shallow open injury with a red, pink wound bed without slough.  Wound Description (Comments):   Present on Admission: Yes     Pressure Injury 08/06/21 Buttocks Mid Stage 4 - Full thickness tissue loss with exposed bone, tendon or muscle. Stage 4 pressure injury surrounded by full thickness wounds related to MASD (Active)  08/06/21 1901  Location: Buttocks  Location Orientation: Mid  Staging: Stage 4 - Full thickness tissue loss with exposed bone, tendon or muscle.  Wound Description (Comments): Stage 4 pressure injury surrounded by full thickness wounds related to MASD  Present on Admission: Yes     DVT prophylaxis: *** Code Status:   Code Status: DNR Family Communication: *** Disposition Plan: ***   Consultants:  ***  Procedures:  ***  Antimicrobials: ***    Subjective: ***  Objective: Vitals:   08/12/21 1931 08/12/21 2028 08/13/21 0453 08/13/21 0924  BP:  (!) 114/53 96/62 109/71   Pulse:  73 71 83  Resp:  20 18 16   Temp:  98 F (36.7 C) 98.5 F (36.9 C)   TempSrc:   Oral   SpO2: 99% 93% 94% 100%  Weight:      Height:        Intake/Output Summary (Last 24 hours) at 08/13/2021 0931 Last data filed at 08/13/2021 0500 Gross per 24 hour  Intake 333.67 ml  Output 200 ml  Net 133.67 ml   Filed Weights   08/11/21 0430  Weight: 112 kg    Examination:  General exam: Appears calm and comfortable. Chronically ill appearing. HEENT: Dry mucous membranes Respiratory system: Diminished. Respiratory effort normal. Cardiovascular system: S1 & S2 heard, RRR. No murmurs, rubs, gallops or clicks. Gastrointestinal system: Abdomen is nondistended, soft and nontender. No organomegaly or masses felt. Normal bowel sounds heard. Central nervous system: Slightly somnolent but arouses easily and oriented to at least self and place. Difficult to get her to answer all questions. Appears to have general weakness but attempts to follow commands Musculoskeletal: Bilateral LE 2+ pitting edema up to thighs. No calf tenderness Skin: No cyanosis. No rashes     Data Reviewed: I have personally reviewed following labs and imaging studies  CBC Lab Results  Component Value Date   WBC 13.5 (H) 08/13/2021   RBC 3.67 (L) 08/13/2021   HGB 9.8 (L) 08/13/2021   HCT 33.9 (L) 08/13/2021   MCV 92.4 08/13/2021   MCH 26.7 08/13/2021  PLT 258 08/13/2021   MCHC 28.9 (L) 08/13/2021   RDW 17.3 (H) 08/13/2021   LYMPHSABS 0.8 08/12/2021   MONOABS 0.9 08/12/2021   EOSABS 0.3 08/12/2021   BASOSABS 0.1 93/57/0177     Last metabolic panel Lab Results  Component Value Date   NA 139 08/13/2021   K 4.1 08/13/2021   CL 99 08/13/2021   CO2 35 (H) 08/13/2021   BUN 20 08/13/2021   CREATININE <0.30 (L) 08/13/2021   GLUCOSE 99 08/13/2021   GFRNONAA NOT CALCULATED 08/13/2021   GFRAA >60 03/10/2018   CALCIUM 8.0 (L) 08/13/2021   PHOS 2.7 08/09/2021   PROT 5.1 (L) 08/11/2021   ALBUMIN 2.1 (L)  08/11/2021   BILITOT 0.4 08/11/2021   ALKPHOS 74 08/11/2021   AST 12 (L) 08/11/2021   ALT 14 08/11/2021   ANIONGAP 5 08/13/2021    CBG (last 3)  Recent Labs    08/12/21 1648 08/12/21 2029 08/13/21 0730  GLUCAP 118* 106* 90     GFR: CrCl cannot be calculated (This lab value cannot be used to calculate CrCl because it is not a number: <0.30).  Coagulation Profile: Recent Labs  Lab 08/09/21 0451 08/10/21 0457 08/11/21 0424 08/12/21 0459 08/13/21 0524  INR 2.4* 2.8* 2.8* 2.2* 2.1*    Recent Results (from the past 240 hour(s))  Resp Panel by RT-PCR (Flu A&B, Covid) Nasopharyngeal Swab     Status: None   Collection Time: 08/05/21 10:19 PM   Specimen: Nasopharyngeal Swab; Nasopharyngeal(NP) swabs in vial transport medium  Result Value Ref Range Status   SARS Coronavirus 2 by RT PCR NEGATIVE NEGATIVE Final    Comment: (NOTE) SARS-CoV-2 target nucleic acids are NOT DETECTED.  The SARS-CoV-2 RNA is generally detectable in upper respiratory specimens during the acute phase of infection. The lowest concentration of SARS-CoV-2 viral copies this assay can detect is 138 copies/mL. A negative result does not preclude SARS-Cov-2 infection and should not be used as the sole basis for treatment or other patient management decisions. A negative result may occur with  improper specimen collection/handling, submission of specimen other than nasopharyngeal swab, presence of viral mutation(s) within the areas targeted by this assay, and inadequate number of viral copies(<138 copies/mL). A negative result must be combined with clinical observations, patient history, and epidemiological information. The expected result is Negative.  Fact Sheet for Patients:  EntrepreneurPulse.com.au  Fact Sheet for Healthcare Providers:  IncredibleEmployment.be  This test is no t yet approved or cleared by the [place] FDA and  has been authorized for  detection and/or diagnosis of SARS-CoV-2 by FDA under an Emergency Use Authorization (EUA). This EUA will remain  in effect (meaning this test can be used) for the duration of the COVID-19 declaration under Section 564(b)(1) of the Act, 21 U.S.C.section 360bbb-3(b)(1), unless the authorization is terminated  or revoked sooner.       Influenza A by PCR NEGATIVE NEGATIVE Final   Influenza B by PCR NEGATIVE NEGATIVE Final    Comment: (NOTE) The Xpert Xpress SARS-CoV-2/FLU/RSV plus assay is intended as an aid in the diagnosis of influenza from Nasopharyngeal swab specimens and should not be used as a sole basis for treatment. Nasal washings and aspirates are unacceptable for Xpert Xpress SARS-CoV-2/FLU/RSV testing.  Fact Sheet for Patients: EntrepreneurPulse.com.au  Fact Sheet for Healthcare Providers: IncredibleEmployment.be  This test is not yet approved or cleared by the [place] FDA and has been authorized for detection and/or diagnosis of SARS-CoV-2 by FDA under an Emergency Use  Authorization (EUA). This EUA will remain in effect (meaning this test can be used) for the duration of the COVID-19 declaration under Section 564(b)(1) of the Act, 21 U.S.C. section 360bbb-3(b)(1), unless the authorization is terminated or revoked.  Performed at [facility], [place] [address], [place], [place] [address]   Blood culture (routine x 2)     Status: None   Collection Time: 08/05/21 10:23 PM   Specimen: Right Antecubital; Blood  Result Value Ref Range Status   Specimen Description   Final    RIGHT ANTECUBITAL Performed at [place] [address], [place], [place] [address]    Special Requests   Final    BOTTLES DRAWN AEROBIC AND ANAEROBIC Blood Culture adequate volume Performed at [place] [address], [place], [place] [address]    Culture   Final    NO GROWTH 5  DAYS Performed at [facility], [place] [address] [place], [place] [address]    Report Status 08/11/2021 FINAL  Final  Urine Culture     Status: Abnormal   Collection Time: 08/05/21 10:33 PM   Specimen: Urine, Clean Catch  Result Value Ref Range Status   Specimen Description   Final    URINE, CLEAN CATCH Performed at [facility], [place] [address] [place], [place] [address]    Special Requests   Final    NONE Performed at [facility], [place] [address] [place], [place] [address]    Culture >=100,000 COLONIES/mL PROTEUS MIRABILIS (A)  Final   Report Status 08/08/2021 FINAL  Final   Organism ID, Bacteria PROTEUS MIRABILIS (A)  Final      Susceptibility   Proteus mirabilis - MIC*    AMPICILLIN <=2 SENSITIVE Sensitive     CEFAZOLIN <=4 SENSITIVE Sensitive     CEFEPIME <=0.12 SENSITIVE Sensitive     CEFTRIAXONE <=0.25 SENSITIVE Sensitive     CIPROFLOXACIN <=0.25 SENSITIVE Sensitive     GENTAMICIN <=1 SENSITIVE Sensitive     IMIPENEM 2 SENSITIVE Sensitive     NITROFURANTOIN 128 RESISTANT Resistant     TRIMETH/SULFA <=20 SENSITIVE Sensitive     AMPICILLIN/SULBACTAM <=2 SENSITIVE Sensitive     PIP/TAZO <=4 SENSITIVE Sensitive     * >=100,000 COLONIES/mL PROTEUS MIRABILIS  MRSA Next Gen by PCR, Nasal     Status: None   Collection Time: 08/07/21  1:19 PM   Specimen: Nasal Mucosa; Nasal Swab  Result Value Ref Range Status   MRSA by PCR Next Gen NOT DETECTED NOT DETECTED Final    Comment: (NOTE) The GeneXpert MRSA Assay (FDA approved for NASAL specimens only), is one component of a comprehensive MRSA colonization surveillance program. It is not intended to diagnose MRSA infection nor to guide or monitor treatment for MRSA infections. Test performance is not FDA approved in patients less than 52 years old. Performed at [facility], [place] [address], [place], [place] [address]   Culture, body fluid w Gram Stain-bottle      Status: None (Preliminary result)   Collection Time: 08/10/21  2:51 PM   Specimen: Pleura  Result Value Ref Range Status   Specimen Description PLEURAL FLUID  Final   Special Requests NONE  Final   Culture   Final    NO GROWTH 3 DAYS Performed at [place] [address], [place], [place] [address]    Report Status PENDING  Incomplete  Gram stain  Status: None   Collection Time: 08/10/21  2:51 PM   Specimen: Pleura  Result Value Ref Range Status   Specimen Description PLEURAL FLUID  Final   Special Requests NONE  Final   Gram Stain   Final    WBC PRESENT, PREDOMINANTLY PMN NO ORGANISMS SEEN CYTOSPIN SMEAR Performed at [facility], [place] [address] [place], [place] [address]    Report Status 08/11/2021 FINAL  Final        Radiology Studies: No results found.      Scheduled Meds:  vitamin C  1,000 mg Oral Daily   cholecalciferol  2,000 Units Oral Daily   digoxin  0.125 mg Oral Daily   feeding supplement (GLUCERNA SHAKE)  237 mL Oral BID BM   fluticasone furoate-vilanterol  1 puff Inhalation QHS   insulin aspart  0-9 Units Subcutaneous TID WC   loratadine  10 mg Oral Daily   mouth rinse  15 mL Mouth Rinse BID   metFORMIN  500 mg Oral BID WC   metoprolol succinate  100 mg Oral BID   multivitamin with minerals  1 tablet Oral BID PC   nutrition supplement (JUVEN)  1 packet Oral BID WC   pantoprazole  40 mg Oral Daily   Warfarin - Pharmacist Dosing Inpatient   Does not apply q1600   Continuous Infusions:  sodium chloride 10 mL/hr at 08/10/21 1312     LOS: 7 days     Cordelia Poche, MD [facility] 08/13/2021, 9:31 AM  If 7PM-7AM, please contact night-coverage [email or web address]

## 2021-08-13 NOTE — Assessment & Plan Note (Addendum)
Baseline O2 use of 2 L/min. Acute hypoxia secondary to pneumonia/pleural effusions. Patient weaned down to 3 L/min prior to discharge. Continue to wean as able.

## 2021-08-13 NOTE — Assessment & Plan Note (Addendum)
Continue metformin on discharge.

## 2021-08-13 NOTE — Assessment & Plan Note (Addendum)
Possibly related to kyphosis and restrictive lung as a result. Patient has been treated intermittently with BiPAP but does not tolerate it for very long. Discussed this with patient's sister who voices that patient is more in line with less is more. Patient declined use of BiPAP at night during admission. Hypercapnia is compensated.

## 2021-08-13 NOTE — Progress Notes (Signed)
Pt refused nocturnal bipap, RN aware. Pt encouraged to let nurse know if she changes her mind.  Bipap remains in room on standby if needed.

## 2021-08-13 NOTE — NC FL2 (Signed)
[place] MEDICAID FL2 LEVEL OF CARE SCREENING TOOL     IDENTIFICATION  Patient Name: Casey Patel Birthdate: [birth year] Sex: female Admission Date (Current Location): 08/05/2021  [facility] and [place] Number:  Herbalist and Address:  [facility],  [place], [place]      Provider Number: [id number]  Attending Physician Name and Address:  Mariel Aloe, MD  Relative Name and Phone Number:       Current Level of Care: Hospital Recommended Level of Care: [place] Prior Approval Number:    Date Approved/Denied:   PASRR Number: [id number]  Discharge Plan: SNF    Current Diagnoses: Patient Active Problem List   Diagnosis Date Noted   CAP (community acquired pneumonia) 08/06/2021   Prolonged QT interval 08/06/2021   Generalized weakness    Palliative care by specialist    Advanced care planning/counseling discussion    Atrial flutter with rapid ventricular response ([place]) 07/02/2021   Goals of care, counseling/discussion 42/70/6237   Acute metabolic encephalopathy 62/83/1517   Atrial fibrillation with RVR ([place]) 06/25/2021   Subtherapeutic international normalized ratio (INR) 06/25/2021   UTI (urinary tract infection) 06/24/2021   Pressure injury of skin 03/08/2018   Atrial fibrillation ([place]) 03/07/2018   Cough 01/01/2012   Pneumonia 12/03/2011   Arthritis    Hypertension    Hyperlipidemia    Asthma    Type 2 diabetes mellitus ([place])     Orientation RESPIRATION BLADDER Height & Weight     Self, Time, Situation, Place  Normal Continent Weight: 112 kg Height:  5\' 6"  (167.6 cm)  BEHAVIORAL SYMPTOMS/MOOD NEUROLOGICAL BOWEL NUTRITION STATUS      Continent Diet (regular)  AMBULATORY STATUS COMMUNICATION OF NEEDS Skin   Extensive Assist Verbally Normal                       Personal Care Assistance Level of Assistance  Bathing, Feeding, Dressing Bathing Assistance: Limited assistance Feeding assistance:  Limited assistance Dressing Assistance: Limited assistance     Functional Limitations Info  Sight, Hearing, Speech Sight Info: Adequate Hearing Info: Adequate Speech Info: Adequate    SPECIAL CARE FACTORS FREQUENCY  PT (By licensed PT), OT (By licensed OT)     PT Frequency: 5x weekly OT Frequency: 5 x weekly            Contractures Contractures Info: Not present    Additional Factors Info  Code Status Code Status Info: dnr             Current Medications (08/13/2021):  This is the current hospital active medication list Current Facility-Administered Medications  Medication Dose Route Frequency Provider Last Rate Last Admin   0.9 %  sodium chloride infusion   Intravenous PRN Jennye Boroughs, MD 10 mL/hr at 08/10/21 1312 New Bag at 08/10/21 1312   albuterol (PROVENTIL) (2.5 MG/3ML) 0.083% nebulizer solution 2.5 mg  2.5 mg Nebulization Q4H PRN Reubin Milan, MD       ascorbic acid (VITAMIN C) tablet 1,000 mg  1,000 mg Oral Daily Reubin Milan, MD   1,000 mg at 08/13/21 6160   cholecalciferol (VITAMIN D3) tablet 2,000 Units  2,000 Units Oral Daily Reubin Milan, MD   2,000 Units at 08/13/21 7371   digoxin (LANOXIN) tablet 0.125 mg  0.125 mg Oral Daily Reubin Milan, MD   0.125 mg at 08/13/21 0626   docusate sodium (COLACE) capsule 100 mg  100 mg Oral Daily  PRN Reubin Milan, MD       feeding supplement (GLUCERNA SHAKE) ([place]) liquid 237 mL  237 mL Oral BID BM Reubin Milan, MD   237 mL at 08/12/21 0940   fluticasone furoate-vilanterol (BREO ELLIPTA) 100-25 MCG/ACT 1 puff  1 puff Inhalation QHS Manuella Ghazi, Pratik D, DO       insulin aspart (novoLOG) injection 0-9 Units  0-9 Units Subcutaneous TID WC Reubin Milan, MD   1 Units at 08/11/21 1832   ipratropium (ATROVENT) nebulizer solution 0.5 mg  0.5 mg Nebulization Q4H PRN Reubin Milan, MD       loratadine (CLARITIN) tablet 10 mg  10 mg Oral Daily Reubin Milan, MD   10  mg at 08/13/21 9381   MEDLINE mouth rinse  15 mL Mouth Rinse BID Reubin Milan, MD   15 mL at 08/13/21 0942   metFORMIN (GLUCOPHAGE) tablet 500 mg  500 mg Oral BID WC Reubin Milan, MD   500 mg at 08/13/21 0175   metoprolol succinate (TOPROL-XL) 24 hr tablet 100 mg  100 mg Oral BID Lovey Newcomer T, NP   100 mg at 08/13/21 1025   multivitamin with minerals tablet 1 tablet  1 tablet Oral BID PC Reubin Milan, MD   1 tablet at 08/13/21 8527   nutrition supplement (JUVEN) (JUVEN) powder packet 1 packet  1 packet Oral BID WC Reubin Milan, MD   1 packet at 08/12/21 1638   pantoprazole (PROTONIX) EC tablet 40 mg  40 mg Oral Daily Reubin Milan, MD   40 mg at 08/13/21 7824   Warfarin - Pharmacist Dosing Inpatient   Does not apply q1600 Jennye Boroughs, MD         Discharge Medications: Please see discharge summary for a list of discharge medications.  Relevant Imaging Results:  Relevant Lab Results:   Additional Information SSN- [id number], Both Covid Vaccines, 2 boosters,  Leeroy Cha, RN

## 2021-08-13 NOTE — Hospital Course (Signed)
Casey Patel is a 88 y.o. female with a history of chronic atrial fibrillation on warfarin, osteoarthritis, asthma, diabetes mellitus type 2, hyperlipidemia and hypertension. Patient presented secondary to worsening mental status and vomiting with concern for pneumonia. She was started no empiric antibiotics. Urine culture suggestive of a UTI. Imaging also suggestive of bilateral pleural effusions; she is s/p a right thoracentesis.

## 2021-08-13 NOTE — Assessment & Plan Note (Addendum)
Continue Warfarin, metoprolol and digoxin

## 2021-08-13 NOTE — Assessment & Plan Note (Addendum)
Mid-buttocks. Wound care recommendations (08/07/2021): Cut piece of Aquacel and tuck into sacrum/buttock wound Q day, using swab to fill, then cover outer wounds with the rest of the sheet of Aquacel. Cover with foam dressing (change foam dressing Q 3 days or PRN soiling.)

## 2021-08-13 NOTE — Progress Notes (Signed)
ANTICOAGULATION CONSULT NOTE -  Pharmacy Consult for warfarin Indication: hx atrial fibrillation  Allergies  Allergen Reactions   Wheat Bran Shortness Of Breath   Kiwi Extract Other (See Comments)    Causes mouth sores   Orange Fruit [Citrus] Other (See Comments)    Causes mouth sores and "Allergic," per [facility]   Phenergan [Promethazine] Other (See Comments)    Disorientation and "Allergic," per [facility]    Pineapple Other (See Comments)    Causes mouth sores    Promethazine Hcl Other (See Comments)    Disorientation and "Allergic," per [facility]    Tomato Other (See Comments)    Causes mouth sores   Tylenol [Acetaminophen] Other (See Comments)    Causes fast heart rate and "Allergic," per [facility]   Vistaril [Hydroxyzine Hcl] Other (See Comments)    Confusion and "Allergic," per [facility]    Codeine Itching, Rash and Other (See Comments)    "Allergic," per [facility]   Tape Rash and Other (See Comments)    Blisters on skin- "Allergic," per [facility]    Patient Measurements: Height: 5\' 6"  (167.6 cm) Weight: 112 kg (246 lb 14.6 oz) IBW/kg (Calculated) : 59.3 Heparin Dosing Weight:   Vital Signs: Temp: 98.5 F (36.9 C) (02/15 0453) Temp Source: Oral (02/15 0453) BP: 109/71 (02/15 0924) Pulse Rate: 83 (02/15 0924)  Labs: Recent Labs    08/11/21 0424 08/12/21 0459 08/13/21 0524  HGB 9.7* 10.1* 9.8*  HCT 33.0* 35.0* 33.9*  PLT 309 292 258  LABPROT 29.8* 24.6* 23.1*  INR 2.8* 2.2* 2.1*  CREATININE <0.30*  --  <0.30*     CrCl cannot be calculated (This lab value cannot be used to calculate CrCl because it is not a number: <0.30).   Medications:  - PTA warfarin regimen: 2.5 mg MTTSS with last dose on 2/5 per [facility] MAR d/c on 2/6 ostensibly d/t elevated INR  Assessment: Patient is an 88 y.o F with hx afib on warfarin PTA, presented to the ED on 08/05/21 with AMS.  Pharmacy has been consulted to dose warfarin.  - INR was elevated on admission (2/7) at  4.5, but pt was on doxycycline for wound and  levaquin for PNA  Today, 08/13/2021: - INR remains in therapeutic range . Today INR Is 2.1  - Hemoglobin low but stable  - No bleeding documented  - drug-drug intxns: being on abx (currently on Augmentin) can make pt more sensitive to warfarin >> stopped 2/14    Goal of Therapy:  INR 2-3 Monitor platelets by anticoagulation protocol: Yes   Plan:  - warfarin 2.5 mg dose today - daily INR - monitor for s/sx bleeding   Royetta Asal, PharmD, BCPS 08/13/2021 1:15 PM

## 2021-08-13 NOTE — Assessment & Plan Note (Addendum)
Continue Toprol-XL. 

## 2021-08-13 NOTE — Assessment & Plan Note (Signed)
Chest imaging significant for bilateral pleural effusions with pneumonia unable to be excluded. Patient started on Vancomycin and Cefepime empirically with completion of treatment.

## 2021-08-13 NOTE — TOC Progression Note (Signed)
Transition of Care [facility] - Progression Note    Patient Details  Name: Demetrica Zipp MRN: [id number] Date of Birth: May 17, 1930  Transition of [facility]) CM/SW Contact  Leeroy Cha, RN Phone Number: 08/13/2021, 12:26 PM  Clinical Narrative:    Phoebe Perch and text message sent to Janie at [facility] that patient will be ready to come back on [id number]   Expected Discharge Plan: [place] Barriers to Discharge: Continued Medical Work up  Expected Discharge Plan and Services Expected Discharge Plan: [place]   Discharge Planning Services: CM Consult Post Acute Care Choice: [place] Living arrangements for the past 2 months: [place]                                       Social Determinants of Health (SDOH) Interventions    Readmission Risk Interventions Readmission Risk Prevention Plan 08/07/2021 07/04/2021 06/26/2021  Transportation Screening Complete Complete Complete  PCP or Specialist Appt within 5-7 Days - - Complete  PCP or Specialist Appt within 3-5 Days Complete - -  Home Care Screening - - Complete  Medication Review (RN CM) - - Complete  HRI or Home Care Consult Complete Complete -  Social Work Consult for [place] Planning/Counseling Complete Complete -  Palliative Care Screening Complete Complete -  Medication Review Press photographer) Complete - -  Some recent data might be hidden

## 2021-08-13 NOTE — Assessment & Plan Note (Signed)
Patient underwent right sided thoracentesis of 2/12 yielding about 1 L of transudate fluid. Cytology with atypical cells. Fluid culture with no growth.

## 2021-08-14 DIAGNOSIS — I5020 Unspecified systolic (congestive) heart failure: Secondary | ICD-10-CM | POA: Diagnosis not present

## 2021-08-14 DIAGNOSIS — I10 Essential (primary) hypertension: Secondary | ICD-10-CM | POA: Diagnosis not present

## 2021-08-14 DIAGNOSIS — R293 Abnormal posture: Secondary | ICD-10-CM | POA: Diagnosis not present

## 2021-08-14 DIAGNOSIS — L89159 Pressure ulcer of sacral region, unspecified stage: Secondary | ICD-10-CM | POA: Diagnosis not present

## 2021-08-14 DIAGNOSIS — E785 Hyperlipidemia, unspecified: Secondary | ICD-10-CM | POA: Diagnosis not present

## 2021-08-14 DIAGNOSIS — R531 Weakness: Secondary | ICD-10-CM | POA: Diagnosis not present

## 2021-08-14 DIAGNOSIS — E119 Type 2 diabetes mellitus without complications: Secondary | ICD-10-CM | POA: Diagnosis not present

## 2021-08-14 DIAGNOSIS — L89893 Pressure ulcer of other site, stage 3: Secondary | ICD-10-CM | POA: Diagnosis not present

## 2021-08-14 DIAGNOSIS — R41841 Cognitive communication deficit: Secondary | ICD-10-CM | POA: Diagnosis not present

## 2021-08-14 DIAGNOSIS — J9612 Chronic respiratory failure with hypercapnia: Secondary | ICD-10-CM | POA: Diagnosis not present

## 2021-08-14 DIAGNOSIS — R918 Other nonspecific abnormal finding of lung field: Secondary | ICD-10-CM | POA: Diagnosis not present

## 2021-08-14 DIAGNOSIS — R627 Adult failure to thrive: Secondary | ICD-10-CM | POA: Diagnosis not present

## 2021-08-14 DIAGNOSIS — Z452 Encounter for adjustment and management of vascular access device: Secondary | ICD-10-CM | POA: Diagnosis not present

## 2021-08-14 DIAGNOSIS — M6281 Muscle weakness (generalized): Secondary | ICD-10-CM | POA: Diagnosis not present

## 2021-08-14 DIAGNOSIS — I509 Heart failure, unspecified: Secondary | ICD-10-CM | POA: Diagnosis not present

## 2021-08-14 DIAGNOSIS — J9621 Acute and chronic respiratory failure with hypoxia: Secondary | ICD-10-CM | POA: Diagnosis not present

## 2021-08-14 DIAGNOSIS — E118 Type 2 diabetes mellitus with unspecified complications: Secondary | ICD-10-CM | POA: Diagnosis not present

## 2021-08-14 DIAGNOSIS — R4182 Altered mental status, unspecified: Secondary | ICD-10-CM | POA: Diagnosis not present

## 2021-08-14 DIAGNOSIS — R1312 Dysphagia, oropharyngeal phase: Secondary | ICD-10-CM | POA: Diagnosis not present

## 2021-08-14 DIAGNOSIS — L8990 Pressure ulcer of unspecified site, unspecified stage: Secondary | ICD-10-CM | POA: Diagnosis not present

## 2021-08-14 DIAGNOSIS — J45909 Unspecified asthma, uncomplicated: Secondary | ICD-10-CM | POA: Diagnosis not present

## 2021-08-14 DIAGNOSIS — R2681 Unsteadiness on feet: Secondary | ICD-10-CM | POA: Diagnosis not present

## 2021-08-14 DIAGNOSIS — Z7401 Bed confinement status: Secondary | ICD-10-CM | POA: Diagnosis not present

## 2021-08-14 DIAGNOSIS — K219 Gastro-esophageal reflux disease without esophagitis: Secondary | ICD-10-CM | POA: Diagnosis not present

## 2021-08-14 DIAGNOSIS — I482 Chronic atrial fibrillation, unspecified: Secondary | ICD-10-CM | POA: Diagnosis not present

## 2021-08-14 DIAGNOSIS — J189 Pneumonia, unspecified organism: Secondary | ICD-10-CM | POA: Diagnosis not present

## 2021-08-14 DIAGNOSIS — K5900 Constipation, unspecified: Secondary | ICD-10-CM | POA: Diagnosis not present

## 2021-08-14 DIAGNOSIS — J90 Pleural effusion, not elsewhere classified: Secondary | ICD-10-CM | POA: Diagnosis not present

## 2021-08-14 DIAGNOSIS — I4891 Unspecified atrial fibrillation: Secondary | ICD-10-CM | POA: Diagnosis not present

## 2021-08-14 DIAGNOSIS — Z7901 Long term (current) use of anticoagulants: Secondary | ICD-10-CM | POA: Diagnosis not present

## 2021-08-14 DIAGNOSIS — L89154 Pressure ulcer of sacral region, stage 4: Secondary | ICD-10-CM | POA: Diagnosis not present

## 2021-08-14 DIAGNOSIS — R278 Other lack of coordination: Secondary | ICD-10-CM | POA: Diagnosis not present

## 2021-08-14 LAB — GLUCOSE, CAPILLARY
Glucose-Capillary: 94 mg/dL (ref 70–99)
Glucose-Capillary: 142 mg/dL — ABNORMAL HIGH (ref 70–99)
Glucose-Capillary: 93 mg/dL (ref 70–99)

## 2021-08-14 LAB — PROTIME-INR
INR: 2.3 — ABNORMAL HIGH (ref 0.8–1.2)
Prothrombin Time: 25 seconds — ABNORMAL HIGH (ref 11.4–15.2)

## 2021-08-14 MED ORDER — WARFARIN SODIUM 2.5 MG PO TABS
2.5000 mg | ORAL_TABLET | Freq: Once | ORAL | Status: AC
  Administered 2021-08-14: 2.5 mg via ORAL
  Filled 2021-08-14: qty 1

## 2021-08-14 NOTE — Progress Notes (Signed)
Attemped to call report to [facility]. Left name and number with secretary for nurse to call RN back.

## 2021-08-14 NOTE — Progress Notes (Signed)
ANTICOAGULATION CONSULT NOTE -  Pharmacy Consult for warfarin Indication: hx atrial fibrillation  Allergies  Allergen Reactions   Wheat Bran Shortness Of Breath   Kiwi Extract Other (See Comments)    Causes mouth sores   Orange Fruit [Citrus] Other (See Comments)    Causes mouth sores and "Allergic," per [facility]   Phenergan [Promethazine] Other (See Comments)    Disorientation and "Allergic," per [facility]    Pineapple Other (See Comments)    Causes mouth sores    Promethazine Hcl Other (See Comments)    Disorientation and "Allergic," per [facility]    Tomato Other (See Comments)    Causes mouth sores   Tylenol [Acetaminophen] Other (See Comments)    Causes fast heart rate and "Allergic," per [facility]   Vistaril [Hydroxyzine Hcl] Other (See Comments)    Confusion and "Allergic," per [facility]    Codeine Itching, Rash and Other (See Comments)    "Allergic," per [facility]   Tape Rash and Other (See Comments)    Blisters on skin- "Allergic," per [facility]    Patient Measurements: Height: 5\' 6"  (167.6 cm) Weight: 112 kg (246 lb 14.6 oz) IBW/kg (Calculated) : 59.3  Vital Signs: Temp: 97.4 F (36.3 C) (02/16 0800) Temp Source: Axillary (02/16 0800) BP: 108/74 (02/16 0800) Pulse Rate: 121 (02/16 0800)  Labs: Recent Labs    08/12/21 0459 08/13/21 0524 08/14/21 0430  HGB 10.1* 9.8*  --   HCT 35.0* 33.9*  --   PLT 292 258  --   LABPROT 24.6* 23.1* 25.0*  INR 2.2* 2.1* 2.3*  CREATININE  --  <0.30*  --      CrCl cannot be calculated (This lab value cannot be used to calculate CrCl because it is not a number: <0.30).   Medications:  - PTA warfarin regimen: 2.5 mg MTTSS with last dose on 2/5 per [facility] d/c on 2/6 ostensibly d/t elevated INR  Assessment: Patient is an 88 y.o F with hx afib on warfarin PTA, presented to the ED on 08/05/21 with AMS.  Pharmacy has been consulted to dose warfarin.  - INR was elevated on admission (2/7) at  4.5, but pt was on doxycycline for wound and levaquin for  PNA  Today, 08/14/2021: - INR remains in therapeutic range .  - Hemoglobin low but stable  - No bleeding documented  - drug-drug intxns: being on abx (currently on Augmentin) can make pt more sensitive to warfarin >> stopped 2/14    Goal of Therapy:  INR 2-3 Monitor platelets by anticoagulation protocol: Yes   Plan:  - Repeat warfarin 2.5 mg dose again today - daily INR - monitor for s/sx bleeding   Adrian Saran, PharmD, BCPS Secure Chat if ?s 08/14/2021 12:11 PM

## 2021-08-14 NOTE — Discharge Instructions (Signed)
Casey Patel,  You were in the hospital with pneumonia and a urinary tract infection. You have been treated successfully with antibiotics and will discharge back to your facility. Please follow-up with your PCP.

## 2021-08-14 NOTE — TOC Progression Note (Addendum)
Transition of Care [facility]) - Progression Note    Patient Details  Name: Casey Patel MRN: [id number] Date of Birth: Jun 10, 1930  [facility] CM/SW Contact  Leeroy Cha, RN Phone Number: 08/14/2021, 9:13 AM  Clinical Narrative:    Tct-sister linda to see if okay for patient to return to [facility] Spoke to both the patient and the sister this am and they are okay with her going back to [facility].  Sister states she is going this am to [facility] to sign the papers. Ptar called for transport at [phone].   Dc summary and snf transport packets sent to [facility] via the hub. Expected Discharge Plan: [place] Barriers to Discharge: Continued Medical Work up  Expected Discharge Plan and Services Expected Discharge Plan: [place]   Discharge Planning Services: CM Consult Post Acute Care Choice: [place] Living arrangements for the past 2 months: [place]                                       Social Determinants of Health (SDOH) Interventions    Readmission Risk Interventions Readmission Risk Prevention Plan 08/07/2021 07/04/2021 06/26/2021  Transportation Screening Complete Complete Complete  PCP or Specialist Appt within 5-7 Days - - Complete  PCP or Specialist Appt within 3-5 Days Complete - -  Home Care Screening - - Complete  Medication Review (RN CM) - - Complete  HRI or Home Care Consult Complete Complete -  Social Work Consult for [place] Planning/Counseling Complete Complete -  Palliative Care Screening Complete Complete -  Medication Review Press photographer) Complete - -  Some recent data might be hidden

## 2021-08-14 NOTE — Discharge Summary (Signed)
Physician Discharge Summary   Patient: Casey Patel MRN: [id number] DOB: [birth year]  Admit date:     08/05/2021  Discharge date: 08/14/21  Discharge Physician: Cordelia Poche, MD   PCP: [name], MD   Recommendations at discharge:   Outpatient PCP follow-up Repeat chest x-ray in 2-3 weeks  Discharge Diagnoses: Principal Problem:   CAP (community acquired pneumonia) Active Problems:   Hypertension   Hyperlipidemia   Type 2 diabetes mellitus (HCC)   Atrial fibrillation ([place])   Pressure injury of skin   UTI (urinary tract infection)   Acute metabolic encephalopathy   Subtherapeutic international normalized ratio (INR)   Prolonged QT interval   Bilateral pleural effusion   Acute on chronic respiratory failure with hypoxia (HCC)   Chronic respiratory failure with hypercapnia ([place])  Resolved Problems:   * No resolved hospital problems. *   Hospital Course: Casey Patel is a 88 y.o. female with a history of chronic atrial fibrillation on warfarin, osteoarthritis, asthma, diabetes mellitus type 2, hyperlipidemia and hypertension. Patient presented secondary to worsening mental status and vomiting with concern for pneumonia. She was started no empiric antibiotics. Urine culture suggestive of a UTI. Imaging also suggestive of bilateral pleural effusions; she is s/p a right thoracentesis.  Assessment and Plan: * CAP (community acquired pneumonia)- (present on admission) Chest imaging significant for bilateral pleural effusions with pneumonia unable to be excluded. Patient started on Vancomycin and Cefepime empirically with completion of treatment.  Chronic respiratory failure with hypercapnia (HCC) Possibly related to kyphosis and restrictive lung as a result. Patient has been treated intermittently with BiPAP but does not tolerate it for very long. Discussed this with patient's sister who voices that patient is more in line with less is more. Patient declined use of BiPAP at  night during admission. Hypercapnia is compensated.  Acute on chronic respiratory failure with hypoxia (HCC) Baseline O2 use of 2 L/min. Acute hypoxia secondary to pneumonia/pleural effusions. Patient weaned down to 3 L/min prior to discharge. Continue to wean as able.  Bilateral pleural effusion Patient underwent right sided thoracentesis of 2/12 yielding about 1 L of transudate fluid. Cytology with atypical cells. Fluid culture with no growth.  UTI (urinary tract infection)- (present on admission) Proteus mirabilis on urine culture (2/7). Patient treated with Augmentin.  Pressure injury of skin- (present on admission) Mid-buttocks. Wound care recommendations (08/07/2021): Cut piece of Aquacel and tuck into sacrum/buttock wound Q day, using swab to fill, then cover outer wounds with the rest of the sheet of Aquacel. Cover with foam dressing (change foam dressing Q 3 days or PRN soiling.)  Atrial fibrillation (HCC)- (present on admission) Continue Warfarin, metoprolol and digoxin  Type 2 diabetes mellitus ([place]) Continue metformin on discharge.  Hypertension- (present on admission) -Continue Toprol XL           Consultants: None Procedures performed: None  Disposition: Skilled nursing facility Diet recommendation:  Carb modified diet  DISCHARGE MEDICATION: Allergies as of 08/14/2021       Reactions   Wheat Bran Shortness Of Breath   Kiwi Extract Other (See Comments)   Causes mouth sores   Orange Fruit [citrus] Other (See Comments)   Causes mouth sores and "Allergic," per [facility]   Phenergan [promethazine] Other (See Comments)   Disorientation and "Allergic," per [facility]   Pineapple Other (See Comments)   Causes mouth sores   Promethazine Hcl Other (See Comments)   Disorientation and "Allergic," per [facility]   Tomato Other (See Comments)   Causes mouth  sores   Tylenol [acetaminophen] Other (See Comments)   Causes fast heart rate and "Allergic," per [facility]   Vistaril [hydroxyzine  Hcl] Other (See Comments)   Confusion and "Allergic," per [facility]   Codeine Itching, Rash, Other (See Comments)   "Allergic," per [facility]   Tape Rash, Other (See Comments)   Blisters on skin- "Allergic," per [facility]        Medication List     STOP taking these medications    doxycycline 100 MG tablet Commonly known as: VIBRA-TABS   levofloxacin 750 MG tablet Commonly known as: LEVAQUIN   potassium chloride SA 20 MEQ tablet Commonly known as: KLOR-CON M   traMADol 50 MG tablet Commonly known as: ULTRAM       TAKE these medications    digoxin 0.125 MG tablet Commonly known as: LANOXIN Take 1 tablet (0.125 mg total) by mouth daily.   docusate sodium 100 MG capsule Commonly known as: COLACE Take 1 capsule (100 mg total) by mouth daily as needed for mild constipation.   fluticasone furoate-vilanterol 100-25 MCG/INH Aepb Commonly known as: BREO ELLIPTA Inhale 1 puff into the lungs at bedtime.   loratadine 10 MG tablet Commonly known as: CLARITIN Take 10 mg by mouth daily.   metFORMIN 500 MG tablet Commonly known as: GLUCOPHAGE Take 500 mg by mouth 2 (two) times daily with a meal.   methocarbamol 750 MG tablet Commonly known as: ROBAXIN Take 750 mg by mouth 3 (three) times daily.   metoprolol succinate 100 MG 24 hr tablet Commonly known as: TOPROL-XL Take 1 tablet (100 mg total) by mouth 2 (two) times daily.   multivitamin with minerals tablet Take 1 tablet by mouth daily.   feeding supplement (GLUCERNA SHAKE) Liqd Take 237 mLs by mouth 2 (two) times daily between meals.   nutrition supplement (JUVEN) Pack Take 1 packet by mouth 2 (two) times daily with a meal.   omeprazole 20 MG capsule Commonly known as: PRILOSEC Take 20 mg by mouth daily before breakfast.   OXYGEN Inhale 2 L/min into the lungs continuous.   ProAir Digihaler 108 (90 Base) MCG/ACT Aepb Generic drug: Albuterol Sulfate (sensor) Inhale 2 puffs into the lungs every 6 (six) hours as needed ("for  up to 4 doses for moderate pain").   vitamin C 1000 MG tablet Take 1,000 mg by mouth daily.   Vitamin D3 50 MCG (2000 UT) Tabs Take 2,000 Units by mouth daily.   warfarin 2.5 MG tablet Commonly known as: COUMADIN Take 2.5 mg by mouth as directed. 2.5 mg daily on M/Tu/Th/Sa/Su         Discharge Exam: Filed Weights   08/11/21 0430  Weight: 112 kg   General exam: Appears calm and comfortable Respiratory system: Clear to auscultation. Respiratory effort normal. Cardiovascular system: S1 & S2 heard, RRR. No murmurs, rubs, gallops or clicks. Gastrointestinal system: Abdomen is nondistended, soft and nontender. No organomegaly or masses felt. Normal bowel sounds heard. Central nervous system: Alert and oriented. No focal neurological deficits. Musculoskeletal: No calf tenderness Skin: No cyanosis. No rashes   Condition at discharge: stable  The results of significant diagnostics from this hospitalization (including imaging, microbiology, ancillary and laboratory) are listed below for reference.   Imaging Studies: DG Chest 2 View  Result Date: 08/05/2021 CLINICAL DATA:  Altered mental status EXAM: CHEST - 2 VIEW COMPARISON:  07/06/2021, 07/02/2021 FINDINGS: Moderate bilateral pleural effusions with mild loculation on the right. Cardiomegaly. Basilar airspace disease. No visible pneumothorax IMPRESSION: Increased bilateral effusions  appear moderate in size and there may be some loculation on the right. Cardiomegaly. Atelectasis or pneumonia at the bases. Electronically Signed   By: Donavan Foil M.D.   On: 08/05/2021 23:02   CT HEAD WO CONTRAST (5MM)  Result Date: 08/05/2021 CLINICAL DATA:  Altered mental status EXAM: CT HEAD WITHOUT CONTRAST TECHNIQUE: Contiguous axial images were obtained from the base of the skull through the vertex without intravenous contrast. RADIATION DOSE REDUCTION: This exam was performed according to the departmental dose-optimization program which includes  automated exposure control, adjustment of the mA and/or kV according to patient size and/or use of iterative reconstruction technique. COMPARISON:  CT brain 06/24/2021 FINDINGS: Brain: Limited by positioning, patient is kyphotic and images were obtained in the coronal plane. No gross territorial infarction, hemorrhage or mass. Mild atrophy. The ventricles are nonenlarged. Hypodensity in the white matter probably due to chronic small vessel disease. Vascular: Carotid vascular calcification. Skull: Normal. Negative for fracture or focal lesion. Sinuses/Orbits: No acute finding. Other: None IMPRESSION: 1. Limited by patient positioning and artifact. 2. No gross CT evidence for acute intracranial abnormality Electronically Signed   By: Donavan Foil M.D.   On: 08/05/2021 23:30   CT CHEST W CONTRAST  Result Date: 08/06/2021 CLINICAL DATA:  Pneumonia. Pleural effusions with possible loculation on the right on recent chest radiographs. EXAM: CT CHEST WITH CONTRAST TECHNIQUE: Multidetector CT imaging of the chest was performed during intravenous contrast administration. RADIATION DOSE REDUCTION: This exam was performed according to the departmental dose-optimization program which includes automated exposure control, adjustment of the mA and/or kV according to patient size and/or use of iterative reconstruction technique. CONTRAST:  24mL OMNIPAQUE IOHEXOL 300 MG/ML  SOLN COMPARISON:  Chest radiographs obtained earlier today and on 03/07/2018. FINDINGS: Cardiovascular: Atheromatous calcifications, including the coronary arteries and aorta. Enlarged heart, flattened by a narrow AP diameter of the chest. Mediastinum/Nodes: Conglomeration of densely calcified subcarinal and right hilar nodes. No enlarged noncalcified mediastinal or hilar nodes. No enlarged axillary nodes. Unremarkable thyroid gland. Lungs/Pleura: The previously demonstrated right lung calcified granuloma is within atelectatic right middle lobe today. There is  also atelectasis of the majority of the right lower lobe. Moderate to large right pleural effusion with no findings suspicious for loculation. Moderate-sized left pleural effusion without findings suspicious for loculation. Mild associated compressive atelectasis in the right upper lobe and left lower lobe. Upper Abdomen: Cholecystectomy clips. Small calcified granulomata in the liver and spleen. Musculoskeletal: Lumbar spine postoperative changes including laminectomy defects and posterior hardware fusion. Thoracic and cervical spine degenerative changes and kyphosis with the patient's chin resting on her upper chest anteriorly. Mild anterior subluxations at the C2-3, C3-4 and C4-5 levels. IMPRESSION: 1. Moderate to large sized right pleural effusion and moderate-sized left pleural effusion with no findings suspicious for loculation. 2. Bilateral compressive atelectasis, right greater than left. Underlying pneumonia can not be excluded. 3. Cardiomegaly with compression of the heart by a narrow AP diameter of the chest. 4. Changes of previous granulomatous infection. 5.  Calcific coronary artery and aortic atherosclerosis. Aortic Atherosclerosis (ICD10-I70.0). Electronically Signed   By: Claudie Revering M.D.   On: 08/06/2021 15:01   DG CHEST PORT 1 VIEW  Result Date: 08/10/2021 CLINICAL DATA:  Status post right thoracentesis. EXAM: PORTABLE CHEST 1 VIEW COMPARISON:  08/05/2021 FINDINGS: 1427 hours. Face obscures the lung apices. Within this limitation, no right-sided pneumothorax is visible. The cardio pericardial silhouette is enlarged. Similar retrocardiac left base collapse/consolidation with small left pleural effusion. Bones are  diffusely demineralized. IMPRESSION: 1. No evidence for right-sided pneumothorax after thoracentesis. 2. Similar retrocardiac left base collapse/consolidation with small left pleural effusion. Electronically Signed   By: Misty Stanley M.D.   On: 08/10/2021 15:19   US THORACENTESIS  ASP PLEURAL SPACE W/IMG GUIDE  Result Date: 08/10/2021 INDICATION: Pneumonia, pleural effusion EXAM: ULTRASOUND GUIDED RIGHT THORACENTESIS MEDICATIONS: None. COMPLICATIONS: None immediate. PROCEDURE: An ultrasound guided thoracentesis was thoroughly discussed with the patient and questions answered. The benefits, risks, alternatives and complications were also discussed. The patient understands and wishes to proceed with the procedure. Written consent was obtained. Ultrasound was performed to localize and mark an adequate pocket of fluid in the right chest. The area was then prepped and draped in the normal sterile fashion. 1% Lidocaine was used for local anesthesia. Under ultrasound guidance a 6 Fr Safe-T-Centesis catheter was introduced. Thoracentesis was performed. The catheter was removed and a dressing applied. FINDINGS: A total of approximately 1L of amber pleural fluid was removed. Samples were sent to the laboratory as requested by the clinical team. IMPRESSION: Successful ultrasound guided right thoracentesis yielding 1L of pleural fluid. Performed and dictated by Pasty Spillers, PA-C Electronically Signed   By: Miachel Roux M.D.   On: 08/10/2021 14:51    Microbiology: Results for orders placed or performed during the hospital encounter of 08/05/21  Resp Panel by RT-PCR (Flu A&B, Covid) Nasopharyngeal Swab     Status: None   Collection Time: 08/05/21 10:19 PM   Specimen: Nasopharyngeal Swab; Nasopharyngeal(NP) swabs in vial transport medium  Result Value Ref Range Status   SARS Coronavirus 2 by RT PCR NEGATIVE NEGATIVE Final    Comment: (NOTE) SARS-CoV-2 target nucleic acids are NOT DETECTED.  The SARS-CoV-2 RNA is generally detectable in upper respiratory specimens during the acute phase of infection. The lowest concentration of SARS-CoV-2 viral copies this assay can detect is 138 copies/mL. A negative result does not preclude SARS-Cov-2 infection and should not be used as the sole basis  for treatment or other patient management decisions. A negative result may occur with  improper specimen collection/handling, submission of specimen other than nasopharyngeal swab, presence of viral mutation(s) within the areas targeted by this assay, and inadequate number of viral copies(<138 copies/mL). A negative result must be combined with clinical observations, patient history, and epidemiological information. The expected result is Negative.  Fact Sheet for Patients:  EntrepreneurPulse.com.au  Fact Sheet for Healthcare Providers:  IncredibleEmployment.be  This test is no t yet approved or cleared by the [place] FDA and  has been authorized for detection and/or diagnosis of SARS-CoV-2 by FDA under an Emergency Use Authorization (EUA). This EUA will remain  in effect (meaning this test can be used) for the duration of the COVID-19 declaration under Section 564(b)(1) of the Act, 21 U.S.C.section 360bbb-3(b)(1), unless the authorization is terminated  or revoked sooner.       Influenza A by PCR NEGATIVE NEGATIVE Final   Influenza B by PCR NEGATIVE NEGATIVE Final    Comment: (NOTE) The Xpert Xpress SARS-CoV-2/FLU/RSV plus assay is intended as an aid in the diagnosis of influenza from Nasopharyngeal swab specimens and should not be used as a sole basis for treatment. Nasal washings and aspirates are unacceptable for Xpert Xpress SARS-CoV-2/FLU/RSV testing.  Fact Sheet for Patients: EntrepreneurPulse.com.au  Fact Sheet for Healthcare Providers: IncredibleEmployment.be  This test is not yet approved or cleared by the [place] FDA and has been authorized for detection and/or diagnosis of SARS-CoV-2 by FDA under an Emergency  Use Authorization (EUA). This EUA will remain in effect (meaning this test can be used) for the duration of the COVID-19 declaration under Section 564(b)(1) of the Act, 21  U.S.C. section 360bbb-3(b)(1), unless the authorization is terminated or revoked.  Performed at [facility], [place] [address] [place], [place] [address]   Blood culture (routine x 2)     Status: None   Collection Time: 08/05/21 10:23 PM   Specimen: Right Antecubital; Blood  Result Value Ref Range Status   Specimen Description   Final    RIGHT ANTECUBITAL Performed at [place] [address], [place], [place] [address]    Special Requests   Final    BOTTLES DRAWN AEROBIC AND ANAEROBIC Blood Culture adequate volume Performed at [place] [address] [place], [place] [address]    Culture   Final    NO GROWTH 5 DAYS Performed at [facility], [place] [address], [place], [place] [address]    Report Status 08/11/2021 FINAL  Final  Urine Culture     Status: Abnormal   Collection Time: 08/05/21 10:33 PM   Specimen: Urine, Clean Catch  Result Value Ref Range Status   Specimen Description   Final    URINE, CLEAN CATCH Performed at [facility], [place] [address] [place], [place] [address]    Special Requests   Final    NONE Performed at [facility], [place] [address] [place], [place] [address]    Culture >=100,000 COLONIES/mL PROTEUS MIRABILIS (A)  Final   Report Status 08/08/2021 FINAL  Final   Organism ID, Bacteria PROTEUS MIRABILIS (A)  Final      Susceptibility   Proteus mirabilis - MIC*    AMPICILLIN <=2 SENSITIVE Sensitive     CEFAZOLIN <=4 SENSITIVE Sensitive     CEFEPIME <=0.12 SENSITIVE Sensitive     CEFTRIAXONE <=0.25 SENSITIVE Sensitive     CIPROFLOXACIN <=0.25 SENSITIVE Sensitive     GENTAMICIN <=1 SENSITIVE Sensitive     IMIPENEM 2 SENSITIVE Sensitive     NITROFURANTOIN 128 RESISTANT Resistant     TRIMETH/SULFA <=20 SENSITIVE Sensitive     AMPICILLIN/SULBACTAM <=2 SENSITIVE Sensitive     PIP/TAZO <=4 SENSITIVE Sensitive     * >=100,000 COLONIES/mL PROTEUS  MIRABILIS  MRSA Next Gen by PCR, Nasal     Status: None   Collection Time: 08/07/21  1:19 PM   Specimen: Nasal Mucosa; Nasal Swab  Result Value Ref Range Status   MRSA by PCR Next Gen NOT DETECTED NOT DETECTED Final    Comment: (NOTE) The GeneXpert MRSA Assay (FDA approved for NASAL specimens only), is one component of a comprehensive MRSA colonization surveillance program. It is not intended to diagnose MRSA infection nor to guide or monitor treatment for MRSA infections. Test performance is not FDA approved in patients less than 51 years old. Performed at [facility], [place] [address] [place], [place] [address]   Culture, body fluid w Gram Stain-bottle     Status: None (Preliminary result)   Collection Time: 08/10/21  2:51 PM   Specimen: Pleura  Result Value Ref Range Status   Specimen Description PLEURAL FLUID  Final   Special Requests NONE  Final   Culture   Final    NO GROWTH 4 DAYS Performed at [place] [address] [place],  [address]    Report Status PENDING  Incomplete  Gram stain  Status: None   Collection Time: 08/10/21  2:51 PM   Specimen: Pleura  Result Value Ref Range Status   Specimen Description PLEURAL FLUID  Final   Special Requests NONE  Final   Gram Stain   Final    WBC PRESENT, PREDOMINANTLY PMN NO ORGANISMS SEEN CYTOSPIN SMEAR Performed at [facility], [place] [address], [place], [place] [address]    Report Status 08/11/2021 FINAL  Final    Labs: CBC: Recent Labs  Lab 08/09/21 0833 08/11/21 0424 08/12/21 0459 08/13/21 0524  WBC 11.5* 15.9* 15.2* 13.5*  NEUTROABS 10.3* 14.0* 13.1*  --   HGB 9.6* 9.7* 10.1* 9.8*  HCT 32.7* 33.0* 35.0* 33.9*  MCV 92.4 91.4 91.4 92.4  PLT 330 309 292 975   Basic Metabolic Panel: Recent Labs  Lab 08/09/21 0833 08/11/21 0424 08/13/21 0524  NA 137 136 139  K 4.0 4.2 4.1  CL 95* 96* 99  CO2 38* 34* 35*  GLUCOSE 145* 123* 99  BUN 28* 25* 20  CREATININE  0.34* <0.30* <0.30*  CALCIUM 8.3* 8.2* 8.0*  MG 1.7  --  1.6*  PHOS 2.7  --   --    Liver Function Tests: Recent Labs  Lab 08/11/21 0424  AST 12*  ALT 14  ALKPHOS 74  BILITOT 0.4  PROT 5.1*  ALBUMIN 2.1*   CBG: Recent Labs  Lab 08/13/21 1155 08/13/21 1637 08/13/21 2042 08/14/21 0733 08/14/21 1155  GLUCAP 90 119* 117* 94 142*    Discharge time spent: 3\5 minutes.  Signed: Cordelia Poche, MD [facility] 08/14/2021

## 2021-08-15 DIAGNOSIS — I509 Heart failure, unspecified: Secondary | ICD-10-CM | POA: Diagnosis not present

## 2021-08-15 DIAGNOSIS — R531 Weakness: Secondary | ICD-10-CM | POA: Diagnosis not present

## 2021-08-15 DIAGNOSIS — I4891 Unspecified atrial fibrillation: Secondary | ICD-10-CM | POA: Diagnosis not present

## 2021-08-15 DIAGNOSIS — L8990 Pressure ulcer of unspecified site, unspecified stage: Secondary | ICD-10-CM | POA: Diagnosis not present

## 2021-08-15 LAB — CULTURE, BODY FLUID W GRAM STAIN -BOTTLE: Culture: NO GROWTH

## 2021-08-18 DIAGNOSIS — R627 Adult failure to thrive: Secondary | ICD-10-CM | POA: Diagnosis not present

## 2021-08-18 DIAGNOSIS — I4891 Unspecified atrial fibrillation: Secondary | ICD-10-CM | POA: Diagnosis not present

## 2021-08-18 DIAGNOSIS — I10 Essential (primary) hypertension: Secondary | ICD-10-CM | POA: Diagnosis not present

## 2021-08-18 DIAGNOSIS — K5900 Constipation, unspecified: Secondary | ICD-10-CM | POA: Diagnosis not present

## 2021-08-18 DIAGNOSIS — E785 Hyperlipidemia, unspecified: Secondary | ICD-10-CM | POA: Diagnosis not present

## 2021-08-18 DIAGNOSIS — E119 Type 2 diabetes mellitus without complications: Secondary | ICD-10-CM | POA: Diagnosis not present

## 2021-08-18 DIAGNOSIS — L89159 Pressure ulcer of sacral region, unspecified stage: Secondary | ICD-10-CM | POA: Diagnosis not present

## 2021-08-18 DIAGNOSIS — K219 Gastro-esophageal reflux disease without esophagitis: Secondary | ICD-10-CM | POA: Diagnosis not present

## 2021-08-18 DIAGNOSIS — I5020 Unspecified systolic (congestive) heart failure: Secondary | ICD-10-CM | POA: Diagnosis not present

## 2021-08-18 DIAGNOSIS — M6281 Muscle weakness (generalized): Secondary | ICD-10-CM | POA: Diagnosis not present

## 2021-08-18 DIAGNOSIS — Z7901 Long term (current) use of anticoagulants: Secondary | ICD-10-CM | POA: Diagnosis not present

## 2021-08-18 DIAGNOSIS — J45909 Unspecified asthma, uncomplicated: Secondary | ICD-10-CM | POA: Diagnosis not present

## 2021-08-20 ENCOUNTER — Other Ambulatory Visit: Payer: Self-pay | Admitting: *Deleted

## 2021-08-20 DIAGNOSIS — R627 Adult failure to thrive: Secondary | ICD-10-CM | POA: Diagnosis not present

## 2021-08-20 DIAGNOSIS — E785 Hyperlipidemia, unspecified: Secondary | ICD-10-CM | POA: Diagnosis not present

## 2021-08-20 DIAGNOSIS — Z7901 Long term (current) use of anticoagulants: Secondary | ICD-10-CM | POA: Diagnosis not present

## 2021-08-20 DIAGNOSIS — I5020 Unspecified systolic (congestive) heart failure: Secondary | ICD-10-CM | POA: Diagnosis not present

## 2021-08-20 DIAGNOSIS — I10 Essential (primary) hypertension: Secondary | ICD-10-CM | POA: Diagnosis not present

## 2021-08-20 DIAGNOSIS — I4891 Unspecified atrial fibrillation: Secondary | ICD-10-CM | POA: Diagnosis not present

## 2021-08-20 DIAGNOSIS — E119 Type 2 diabetes mellitus without complications: Secondary | ICD-10-CM | POA: Diagnosis not present

## 2021-08-20 NOTE — Patient Outreach (Signed)
[facility] Post- Acute Care Coordinator follow up. Per [place] eligible member resides in [facility] SN[facility].  Screened for potential [facility] Care Management services as a benefit of member's insurance plan.  Mrs. Yau returned to [facility] SN[facility] on 08/14/21 after hospital readmission.   Facility site visit to [facility] skilled nursing facility. Went to Mrs. Huss room. She was receiving patient care. Member's sister Vaughan Basta not present at the time. Will plan follow up with Vaughan Basta to inquire about transition plans.   Will continue to collaborate with SNF SW and follow transition plans/needs while member resides in SNF.    Marthenia Rolling, MSN, RN,BSN [place] Acute Care Coordinator [phone] [facility]) [phone]  (Toll free office)

## 2021-08-21 DIAGNOSIS — L89154 Pressure ulcer of sacral region, stage 4: Secondary | ICD-10-CM | POA: Diagnosis not present

## 2021-08-21 DIAGNOSIS — L89893 Pressure ulcer of other site, stage 3: Secondary | ICD-10-CM | POA: Diagnosis not present

## 2021-08-22 DIAGNOSIS — Z7901 Long term (current) use of anticoagulants: Secondary | ICD-10-CM | POA: Diagnosis not present

## 2021-08-22 DIAGNOSIS — J45909 Unspecified asthma, uncomplicated: Secondary | ICD-10-CM | POA: Diagnosis not present

## 2021-08-22 DIAGNOSIS — L89159 Pressure ulcer of sacral region, unspecified stage: Secondary | ICD-10-CM | POA: Diagnosis not present

## 2021-08-22 DIAGNOSIS — I5020 Unspecified systolic (congestive) heart failure: Secondary | ICD-10-CM | POA: Diagnosis not present

## 2021-08-22 DIAGNOSIS — I4891 Unspecified atrial fibrillation: Secondary | ICD-10-CM | POA: Diagnosis not present

## 2021-08-22 DIAGNOSIS — R627 Adult failure to thrive: Secondary | ICD-10-CM | POA: Diagnosis not present

## 2021-08-22 DIAGNOSIS — I10 Essential (primary) hypertension: Secondary | ICD-10-CM | POA: Diagnosis not present

## 2021-08-25 DIAGNOSIS — Z7901 Long term (current) use of anticoagulants: Secondary | ICD-10-CM | POA: Diagnosis not present

## 2021-08-25 DIAGNOSIS — I10 Essential (primary) hypertension: Secondary | ICD-10-CM | POA: Diagnosis not present

## 2021-08-25 DIAGNOSIS — J45909 Unspecified asthma, uncomplicated: Secondary | ICD-10-CM | POA: Diagnosis not present

## 2021-08-25 DIAGNOSIS — L89159 Pressure ulcer of sacral region, unspecified stage: Secondary | ICD-10-CM | POA: Diagnosis not present

## 2021-08-25 DIAGNOSIS — I5020 Unspecified systolic (congestive) heart failure: Secondary | ICD-10-CM | POA: Diagnosis not present

## 2021-08-25 DIAGNOSIS — I4891 Unspecified atrial fibrillation: Secondary | ICD-10-CM | POA: Diagnosis not present

## 2021-08-25 DIAGNOSIS — R627 Adult failure to thrive: Secondary | ICD-10-CM | POA: Diagnosis not present

## 2021-08-26 LAB — MISC LABCORP TEST (SEND OUT)
LabCorp test name: 5367
Labcorp test code: 9985

## 2021-08-27 ENCOUNTER — Other Ambulatory Visit: Payer: Self-pay | Admitting: *Deleted

## 2021-08-27 DIAGNOSIS — L89893 Pressure ulcer of other site, stage 3: Secondary | ICD-10-CM | POA: Diagnosis not present

## 2021-08-27 DIAGNOSIS — I10 Essential (primary) hypertension: Secondary | ICD-10-CM | POA: Diagnosis not present

## 2021-08-27 DIAGNOSIS — R1312 Dysphagia, oropharyngeal phase: Secondary | ICD-10-CM | POA: Diagnosis not present

## 2021-08-27 DIAGNOSIS — I4891 Unspecified atrial fibrillation: Secondary | ICD-10-CM | POA: Diagnosis not present

## 2021-08-27 DIAGNOSIS — M1990 Unspecified osteoarthritis, unspecified site: Secondary | ICD-10-CM | POA: Diagnosis not present

## 2021-08-27 DIAGNOSIS — L89323 Pressure ulcer of left buttock, stage 3: Secondary | ICD-10-CM | POA: Diagnosis not present

## 2021-08-27 DIAGNOSIS — J9611 Chronic respiratory failure with hypoxia: Secondary | ICD-10-CM | POA: Diagnosis not present

## 2021-08-27 DIAGNOSIS — E785 Hyperlipidemia, unspecified: Secondary | ICD-10-CM | POA: Diagnosis not present

## 2021-08-27 DIAGNOSIS — R627 Adult failure to thrive: Secondary | ICD-10-CM | POA: Diagnosis not present

## 2021-08-27 DIAGNOSIS — L89154 Pressure ulcer of sacral region, stage 4: Secondary | ICD-10-CM | POA: Diagnosis not present

## 2021-08-27 DIAGNOSIS — F0280 Dementia in other diseases classified elsewhere without behavioral disturbance: Secondary | ICD-10-CM | POA: Diagnosis not present

## 2021-08-27 DIAGNOSIS — N390 Urinary tract infection, site not specified: Secondary | ICD-10-CM | POA: Diagnosis not present

## 2021-08-27 DIAGNOSIS — E119 Type 2 diabetes mellitus without complications: Secondary | ICD-10-CM | POA: Diagnosis not present

## 2021-08-27 DIAGNOSIS — R41841 Cognitive communication deficit: Secondary | ICD-10-CM | POA: Diagnosis not present

## 2021-08-27 DIAGNOSIS — G311 Senile degeneration of brain, not elsewhere classified: Secondary | ICD-10-CM | POA: Diagnosis not present

## 2021-08-27 NOTE — Patient Outreach (Signed)
[facility] Post- Acute Care Coordinator follow up. Per [place] eligible member resides in  [facility] SNF.  Screened for potential [facility] Care Management services as a benefit of member's insurance plan. ? ?Facility site visit to [facility] skilled nursing facility. Met with Antoine Primas, SNF SW who reports Mrs. Vanterpool has transitioned to hospice services.  ? ?No identifiable [facility] Care Management needs. Will sign off.  ? ? ?Marthenia Rolling, MSN, RN,BSN ?[place] Coordinator ?[phone] [facility]) [phone]  (Toll free office)  ? ? ? ? ? ? ?  ?

## 2021-08-28 DIAGNOSIS — G311 Senile degeneration of brain, not elsewhere classified: Secondary | ICD-10-CM | POA: Diagnosis not present

## 2021-08-28 DIAGNOSIS — J9611 Chronic respiratory failure with hypoxia: Secondary | ICD-10-CM | POA: Diagnosis not present

## 2021-08-28 DIAGNOSIS — L89154 Pressure ulcer of sacral region, stage 4: Secondary | ICD-10-CM | POA: Diagnosis not present

## 2021-08-28 DIAGNOSIS — R627 Adult failure to thrive: Secondary | ICD-10-CM | POA: Diagnosis not present

## 2021-08-28 DIAGNOSIS — N390 Urinary tract infection, site not specified: Secondary | ICD-10-CM | POA: Diagnosis not present

## 2021-08-28 DIAGNOSIS — F0280 Dementia in other diseases classified elsewhere without behavioral disturbance: Secondary | ICD-10-CM | POA: Diagnosis not present

## 2021-08-28 DIAGNOSIS — L89893 Pressure ulcer of other site, stage 3: Secondary | ICD-10-CM | POA: Diagnosis not present

## 2021-09-02 ENCOUNTER — Telehealth: Payer: Self-pay | Admitting: Cardiology

## 2021-09-02 NOTE — Telephone Encounter (Signed)
Returned call to patient's sister Vaughan Basta.Left message on personal voice mail sorry to hear about patient's passing.Dr.Jordan out of office this week.I will make him aware when he is back in office next week. ?

## 2021-09-02 NOTE — Telephone Encounter (Signed)
New Message: ? ? ? ? ?Patient's Sister called. She wanted you and Dr [place] to know she passed away on Saturday(09-24-2021). ?

## 2021-09-27 DEATH — deceased

## 2022-07-02 IMAGING — DX DG CHEST 1V PORT
1 series · 1 of 1 positions shown · non-contrast
Comparison: Chest radiograph dated 03/07/2018.

CLINICAL DATA: 89-year-old female with weakness.

EXAM:
PORTABLE CHEST 1 VIEW

[chest ap]
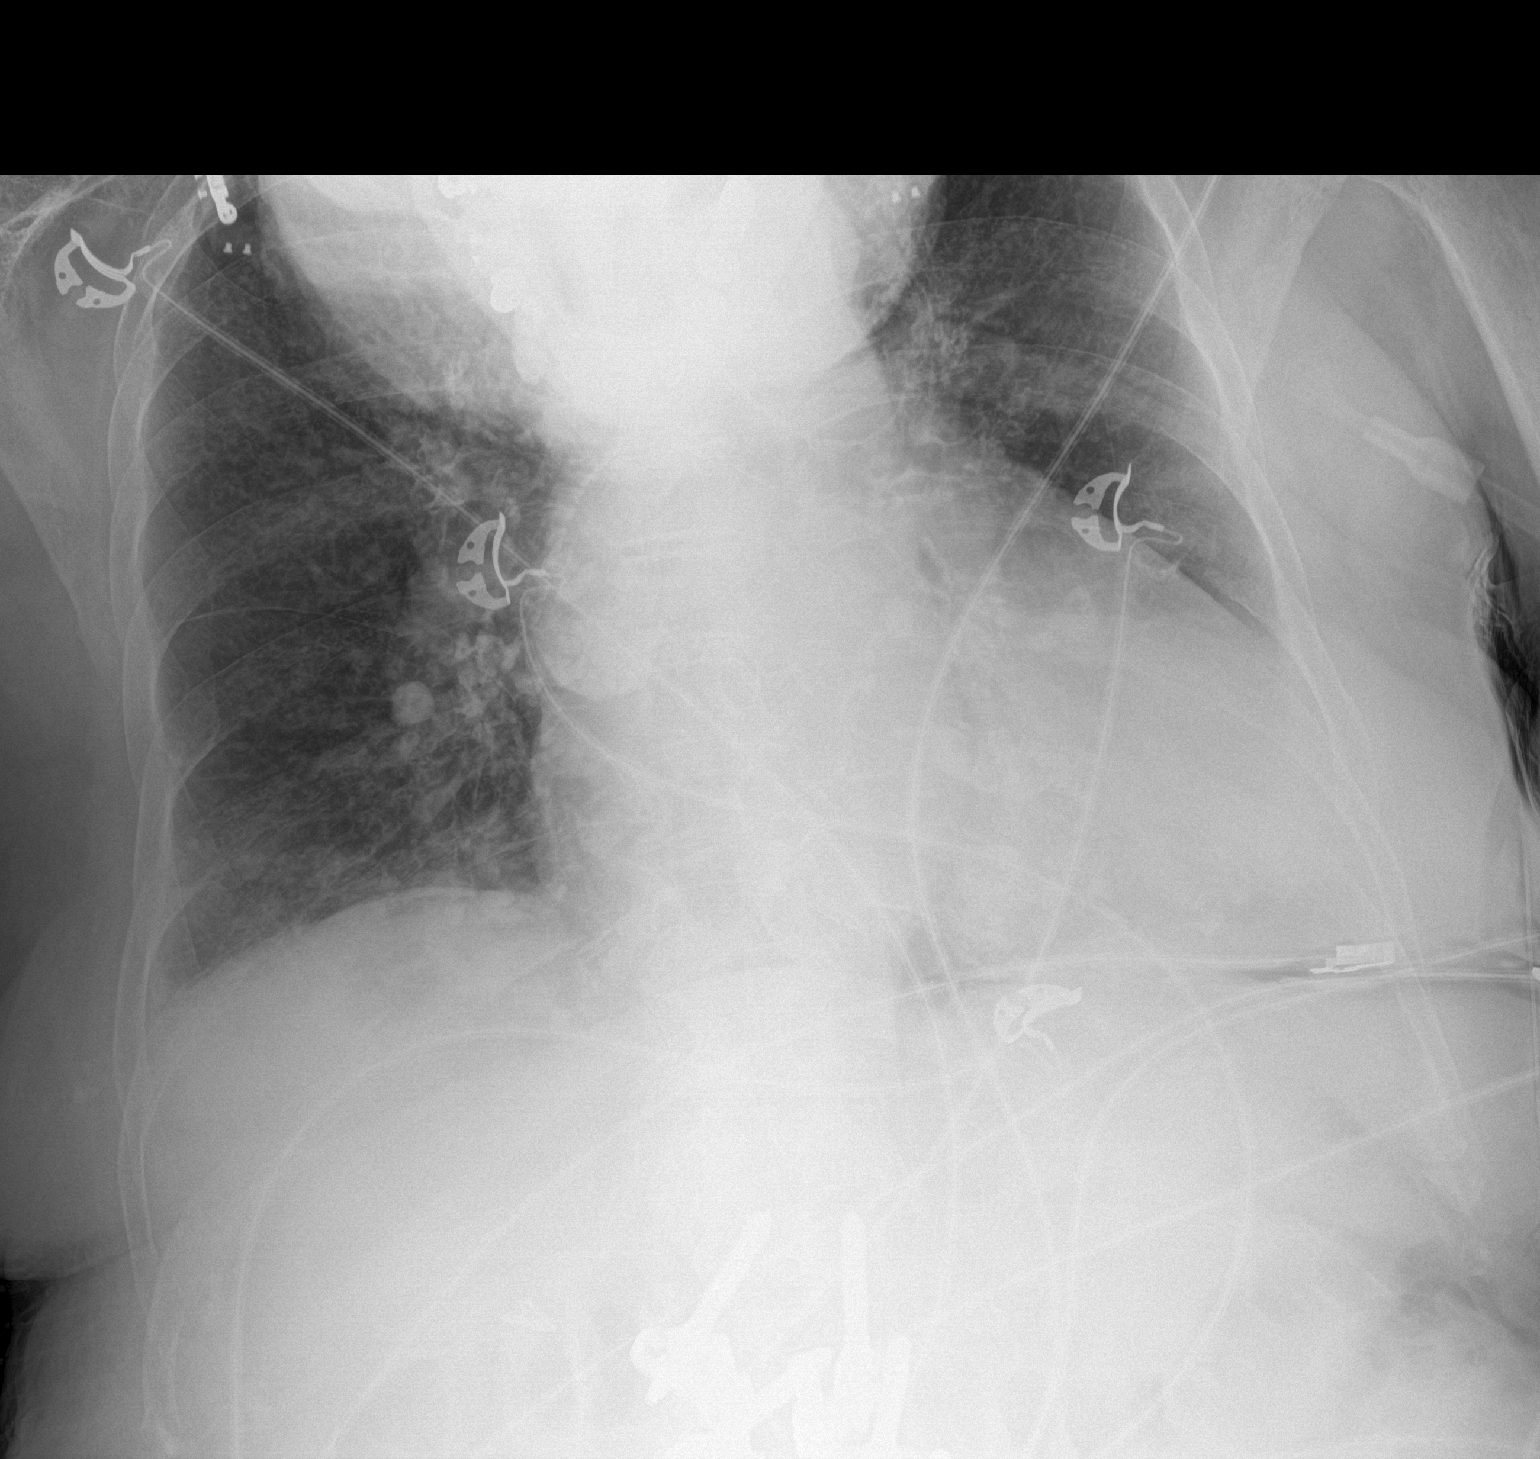

[1 of 1 positions shown; findings below may reference images not displayed]

FINDINGS: Evaluation is limited due to superimposition of the patient's jaw
over the upper chest.

Stable cardiomegaly with probable mild central vascular congestion.
No focal consolidation, pleural effusion or pneumothorax. Right
perihilar calcified granuloma. No acute osseous pathology.
IMPRESSION: Cardiomegaly with probable mild central vascular congestion. No
focal consolidation.
